# Patient Record
Sex: Female | Born: 1975 | Race: Black or African American | Hispanic: No | Marital: Single | Smoking: Former smoker
Health system: Southern US, Community
[De-identification: ages and names within clinical notes are randomized; demographics above are authoritative.]

## PROBLEM LIST (undated history)

## (undated) DIAGNOSIS — K900 Celiac disease: Secondary | ICD-10-CM

## (undated) DIAGNOSIS — R0600 Dyspnea, unspecified: Secondary | ICD-10-CM

## (undated) DIAGNOSIS — Z860100 Personal history of colon polyps, unspecified: Secondary | ICD-10-CM

## (undated) DIAGNOSIS — R1115 Cyclical vomiting syndrome unrelated to migraine: Secondary | ICD-10-CM

## (undated) DIAGNOSIS — G43909 Migraine, unspecified, not intractable, without status migrainosus: Secondary | ICD-10-CM

## (undated) DIAGNOSIS — D649 Anemia, unspecified: Secondary | ICD-10-CM

## (undated) DIAGNOSIS — F411 Generalized anxiety disorder: Secondary | ICD-10-CM

## (undated) DIAGNOSIS — K219 Gastro-esophageal reflux disease without esophagitis: Secondary | ICD-10-CM

## (undated) HISTORY — DX: Gastro-esophageal reflux disease without esophagitis: K21.9

## (undated) HISTORY — DX: Migraine, unspecified, not intractable, without status migrainosus: G43.909

## (undated) HISTORY — DX: Anemia, unspecified: D64.9

## (undated) HISTORY — DX: Celiac disease: K90.0

## (undated) HISTORY — DX: Generalized anxiety disorder: F41.1

## (undated) HISTORY — DX: Personal history of colonic polyps: Z86.010

## (undated) HISTORY — DX: Personal history of colon polyps, unspecified: Z86.0100

---

## 1997-11-05 HISTORY — PX: APPENDECTOMY: SHX54

## 2004-11-05 HISTORY — PX: ABDOMINAL HYSTERECTOMY: SHX81

## 2016-11-05 HISTORY — PX: COLONOSCOPY: SHX174

## 2016-11-05 HISTORY — PX: ESOPHAGOGASTRODUODENOSCOPY: SHX1529

## 2017-06-19 DIAGNOSIS — R001 Bradycardia, unspecified: Secondary | ICD-10-CM | POA: Diagnosis not present

## 2017-06-19 DIAGNOSIS — F1290 Cannabis use, unspecified, uncomplicated: Secondary | ICD-10-CM | POA: Insufficient documentation

## 2017-06-19 DIAGNOSIS — E872 Acidosis: Secondary | ICD-10-CM | POA: Diagnosis not present

## 2017-06-19 DIAGNOSIS — R112 Nausea with vomiting, unspecified: Secondary | ICD-10-CM | POA: Diagnosis not present

## 2017-06-19 DIAGNOSIS — R1013 Epigastric pain: Secondary | ICD-10-CM | POA: Diagnosis not present

## 2018-06-23 ENCOUNTER — Encounter (HOSPITAL_COMMUNITY): Payer: Self-pay | Admitting: Emergency Medicine

## 2018-06-23 ENCOUNTER — Emergency Department (HOSPITAL_COMMUNITY)
Admission: EM | Admit: 2018-06-23 | Discharge: 2018-06-23 | Disposition: A | Discharge status: Home / Self Care | Payer: Medicaid Other | Attending: Emergency Medicine | Admitting: Emergency Medicine

## 2018-06-23 DIAGNOSIS — R1012 Left upper quadrant pain: Secondary | ICD-10-CM | POA: Diagnosis not present

## 2018-06-23 DIAGNOSIS — F17210 Nicotine dependence, cigarettes, uncomplicated: Secondary | ICD-10-CM | POA: Diagnosis not present

## 2018-06-23 DIAGNOSIS — R1013 Epigastric pain: Secondary | ICD-10-CM | POA: Diagnosis not present

## 2018-06-23 DIAGNOSIS — K2900 Acute gastritis without bleeding: Secondary | ICD-10-CM | POA: Insufficient documentation

## 2018-06-23 LAB — COMPREHENSIVE METABOLIC PANEL
ALBUMIN: 4.9 g/dL (ref 3.5–5.0)
ALT: 13 U/L (ref 0–44)
AST: 18 U/L (ref 15–41)
Alkaline Phosphatase: 74 U/L (ref 38–126)
Anion gap: 17 — ABNORMAL HIGH (ref 5–15)
BUN: 15 mg/dL (ref 6–20)
CHLORIDE: 103 mmol/L (ref 98–111)
CO2: 20 mmol/L — AB (ref 22–32)
CREATININE: 0.63 mg/dL (ref 0.44–1.00)
Calcium: 9.7 mg/dL (ref 8.9–10.3)
GFR calc non Af Amer: >60 mL/min (ref >60)
Glucose, Bld: 86 mg/dL (ref 70–99)
Potassium: 3.7 mmol/L (ref 3.5–5.1)
SODIUM: 140 mmol/L (ref 135–145)
Total Bilirubin: 1.2 mg/dL (ref 0.3–1.2)
Total Protein: 8.7 g/dL — ABNORMAL HIGH (ref 6.5–8.1)

## 2018-06-23 LAB — LIPASE, BLOOD: LIPASE: 29 U/L (ref 11–51)

## 2018-06-23 LAB — CBC
HCT: 42.4 % (ref 36.0–46.0)
HEMOGLOBIN: 13.9 g/dL (ref 12.0–15.0)
MCH: 27.6 pg (ref 26.0–34.0)
MCHC: 32.8 g/dL (ref 30.0–36.0)
MCV: 84.1 fL (ref 78.0–100.0)
Platelets: 252 10*3/uL (ref 150–400)
RBC: 5.04 MIL/uL (ref 3.87–5.11)
RDW: 14 % (ref 11.5–15.5)
WBC: 9.4 10*3/uL (ref 4.0–10.5)

## 2018-06-23 MED ORDER — SODIUM CHLORIDE 0.9 % IV BOLUS
2000.0000 mL | Freq: Once | INTRAVENOUS | Status: AC
  Administered 2018-06-23: 2000 mL via INTRAVENOUS

## 2018-06-23 MED ORDER — PANTOPRAZOLE SODIUM 40 MG PO TBEC: 40.0000 mg | DELAYED_RELEASE_TABLET | Freq: Every day | ORAL | 0 refills | Status: DC

## 2018-06-23 MED ORDER — DIPHENHYDRAMINE HCL 50 MG/ML IJ SOLN
12.5000 mg | Freq: Once | INTRAMUSCULAR | Status: AC
  Administered 2018-06-23: 11:00:00 via INTRAVENOUS
  Filled 2018-06-23: qty 1

## 2018-06-23 MED ORDER — PANTOPRAZOLE SODIUM 40 MG IV SOLR
40.0000 mg | Freq: Once | INTRAVENOUS | Status: AC
  Administered 2018-06-23: 40 mg via INTRAVENOUS
  Filled 2018-06-23: qty 40

## 2018-06-23 MED ORDER — MORPHINE SULFATE (PF) 4 MG/ML IV SOLN
4.0000 mg | Freq: Once | INTRAVENOUS | Status: AC
  Administered 2018-06-23: 4 mg via INTRAVENOUS
  Filled 2018-06-23: qty 1

## 2018-06-23 MED ORDER — SODIUM CHLORIDE 0.9 % IV SOLN
INTRAVENOUS | Status: DC
  Administered 2018-06-23: 10:00:00 via INTRAVENOUS

## 2018-06-23 MED ORDER — FAMOTIDINE 20 MG PO TABS: 20.0000 mg | ORAL_TABLET | Freq: Two times a day (BID) | ORAL | 0 refills | Status: DC

## 2018-06-23 MED ORDER — ONDANSETRON HCL 4 MG/2ML IJ SOLN
4.0000 mg | Freq: Once | INTRAMUSCULAR | Status: AC
  Administered 2018-06-23: 4 mg via INTRAVENOUS
  Filled 2018-06-23: qty 2

## 2018-06-23 NOTE — ED Triage Notes (Signed)
Pt reports that she has ulcer and Saturday she has birthday party for her and her son and tried drinking a mix drink and has had abd pain with n/v since. Reports she was in hospital back in April this year "had acid in my blood and this feels the same".

## 2018-06-23 NOTE — ED Provider Notes (Signed)
Patient is feeling much better.  Labs show normal WBC and benign electro lites except mildly low bicarb with mild anion gap elevation.  I think this is from the degree of vomiting she is had.  However she is feeling significantly better with fluids and her vital signs are benign.  I do not think further work-up is needed.  We discussed cutting back on alcohol especially binging but otherwise she appears stable for discharge home with return precautions.  I will DC with PPI and H2 blocker.  Results for orders placed or performed during the hospital encounter of 06/23/18  Lipase, blood  Result Value Ref Range   Lipase 29 11 - 51 U/L  Comprehensive metabolic panel  Result Value Ref Range   Sodium 140 135 - 145 mmol/L   Potassium 3.7 3.5 - 5.1 mmol/L   Chloride 103 98 - 111 mmol/L   CO2 20 (L) 22 - 32 mmol/L   Glucose, Bld 86 70 - 99 mg/dL   BUN 15 6 - 20 mg/dL   Creatinine, Ser 0.63 0.44 - 1.00 mg/dL   Calcium 9.7 8.9 - 10.3 mg/dL   Total Protein 8.7 (H) 6.5 - 8.1 g/dL   Albumin 4.9 3.5 - 5.0 g/dL   AST 18 15 - 41 U/L   ALT 13 0 - 44 U/L   Alkaline Phosphatase 74 38 - 126 U/L   Total Bilirubin 1.2 0.3 - 1.2 mg/dL   GFR calc non Af Amer >60 >60 mL/min   GFR calc Af Amer >60 >60 mL/min   Anion gap 17 (H) 5 - 15  CBC  Result Value Ref Range   WBC 9.4 4.0 - 10.5 K/uL   RBC 5.04 3.87 - 5.11 MIL/uL   Hemoglobin 13.9 12.0 - 15.0 g/dL   HCT 42.4 36.0 - 46.0 %   MCV 84.1 78.0 - 100.0 fL   MCH 27.6 26.0 - 34.0 pg   MCHC 32.8 30.0 - 36.0 g/dL   RDW 14.0 11.5 - 15.5 %   Platelets 252 150 - 400 K/uL   No results found.    Sherwood Gambler, MD 06/23/18 [phone]

## 2018-06-23 NOTE — Discharge Instructions (Addendum)
If your abdominal pain worsens or you develop recurrent vomiting, or if you develop fever, shortness of breath, chest pain, vomiting blood, blood in your stool or black stool, or any other new/concerning symptoms and return to the ER for evaluation.

## 2018-06-23 NOTE — ED Provider Notes (Signed)
[facility] DEPT Provider Note   CSN: [id number] Arrival date & time: 06/23/18  3704     History   Chief Complaint Chief Complaint  Patient presents with  . Abdominal Pain  . Emesis    HPI Allison Leonard is a 42 y.o. female.  42 year old female presents with several days of epigastric and left upper quadrant abdominal pain.  Has a history of peptic ulcer disease and states that symptoms became worse after she had a lot of stress this weekend as well as drink a quarter bottle of beer.  She also continues to use tobacco products.  States that she has had multiple episodes of emesis which one had blood mixed with saliva.  Denies any hematemesis.  No black stools.  Abdominal pain persistent and nothing makes it better or worse.  Denies any vaginal bleeding or discharge.  No urinary symptoms.     Past Medical History:  Diagnosis Date  . Migraine     There are no active problems to display for this patient.   Past Surgical History:  Procedure Laterality Date  . ABDOMINAL HYSTERECTOMY       OB History   None      Home Medications    Prior to Admission medications   Not on File    Family History No family history on file.  Social History Social History   Tobacco Use  . Smoking status: Current Every Day Smoker    Types: Cigarettes  . Smokeless tobacco: Never Used  Substance Use Topics  . Alcohol use: Yes  . Drug use: Yes    Types: Marijuana     Allergies   Morphine and related   Review of Systems Review of Systems  All other systems reviewed and are negative.    Physical Exam Updated Vital Signs BP (!) 172/105 (BP Location: Right Arm)   Pulse (!) 112   Temp 98.2 F (36.8 C) (Oral)   Resp 18   SpO2 98%   Physical Exam  Constitutional: She is oriented to person, place, and time. She appears well-developed and well-nourished.  Non-toxic appearance. No distress.  HENT:  Head: Normocephalic and  atraumatic.  Eyes: Pupils are equal, round, and reactive to light. Conjunctivae, EOM and lids are normal.  Neck: Normal range of motion. Neck supple. No tracheal deviation present. No thyroid mass present.  Cardiovascular: Normal rate, regular rhythm and normal heart sounds. Exam reveals no gallop.  No murmur heard. Pulmonary/Chest: Effort normal and breath sounds normal. No stridor. No respiratory distress. She has no decreased breath sounds. She has no wheezes. She has no rhonchi. She has no rales.  Abdominal: Soft. Normal appearance and bowel sounds are normal. She exhibits no distension. There is tenderness in the epigastric area and left lower quadrant. There is guarding. There is no rigidity, no rebound and no CVA tenderness.    Musculoskeletal: Normal range of motion. She exhibits no edema or tenderness.  Neurological: She is alert and oriented to person, place, and time. She has normal strength. No cranial nerve deficit or sensory deficit. GCS eye subscore is 4. GCS verbal subscore is 5. GCS motor subscore is 6.  Skin: Skin is warm and dry. No abrasion and no rash noted.  Psychiatric: She has a normal mood and affect. Her speech is normal and behavior is normal.  Nursing note and vitals reviewed.    ED Treatments / Results  Labs (all labs ordered are listed, but only abnormal results are  displayed) Labs Reviewed  CBC  LIPASE, BLOOD  COMPREHENSIVE METABOLIC PANEL  URINALYSIS, ROUTINE W REFLEX MICROSCOPIC    EKG None  Radiology No results found.  Procedures Procedures (including critical care time)  Medications Ordered in ED Medications  sodium chloride 0.9 % bolus 2,000 mL (has no administration in time range)  0.9 %  sodium chloride infusion (has no administration in time range)  pantoprazole (PROTONIX) injection 40 mg (has no administration in time range)  ondansetron (ZOFRAN) injection 4 mg (has no administration in time range)  morphine 4 MG/ML injection 4 mg (has  no administration in time range)  diphenhydrAMINE (BENADRYL) injection 12.5 mg (has no administration in time range)     Initial Impression / Assessment and Plan / ED Course  I have reviewed the triage vital signs and the nursing notes.  Pertinent labs & imaging results that were available during my care of the patient were reviewed by me and considered in my medical decision making (see chart for details).     Patient be medicated for suspected peptic ulcer disease with IV fluids as well as Protonix and given analgesics.  Labs are pending at this time and signed out to Dr. Regenia Skeeter  Final Clinical Impressions(s) / ED Diagnoses   Final diagnoses:  None    ED Discharge Orders    None       Lacretia Leigh, MD 06/23/18 1021

## 2018-07-04 ENCOUNTER — Encounter: Payer: Self-pay | Admitting: Family Medicine

## 2018-07-04 ENCOUNTER — Ambulatory Visit: Payer: Medicaid Other | Admitting: Family Medicine

## 2018-07-04 ENCOUNTER — Other Ambulatory Visit: Payer: Self-pay

## 2018-07-04 VITALS — BP 108/62 | HR 94 | Temp 98.6°F | Ht 63.0 in | Wt 98.2 lb

## 2018-07-04 DIAGNOSIS — F411 Generalized anxiety disorder: Secondary | ICD-10-CM

## 2018-07-04 DIAGNOSIS — G43809 Other migraine, not intractable, without status migrainosus: Secondary | ICD-10-CM | POA: Diagnosis not present

## 2018-07-04 DIAGNOSIS — F439 Reaction to severe stress, unspecified: Secondary | ICD-10-CM | POA: Diagnosis not present

## 2018-07-04 DIAGNOSIS — G43909 Migraine, unspecified, not intractable, without status migrainosus: Secondary | ICD-10-CM

## 2018-07-04 DIAGNOSIS — K219 Gastro-esophageal reflux disease without esophagitis: Secondary | ICD-10-CM | POA: Diagnosis not present

## 2018-07-04 HISTORY — DX: Generalized anxiety disorder: F41.1

## 2018-07-04 HISTORY — DX: Gastro-esophageal reflux disease without esophagitis: K21.9

## 2018-07-04 HISTORY — DX: Migraine, unspecified, not intractable, without status migrainosus: G43.909

## 2018-07-04 NOTE — Assessment & Plan Note (Addendum)
Stable. Controlled on famotidine and dietary modifications.  No red flags on history or exam.  Will obtain records from her previous PCP as well as her gastroenterologist

## 2018-07-04 NOTE — Progress Notes (Signed)
Subjective:  Allison Leonard is a 42 y.o. female who presents to the [facility] today to establish care  HPI:  Patient and her family recently moved to [place] from the [place] area in February.  She was previously seen at [facility] where she had her Pap smear last in 2017.  She has no acute concerns today but would like to discuss her chronic migraines and acid reflux.  She has been on famotidine with good control of her stomach pains though she does still have daily nausea and abdominal discomfort.  She was previously on pantoprazole which did not work as well.  She also has made some dietary changes by giving up caffeine with good results.  No vomiting.  No blood in her stool.  She does have some constipation for which she takes Linzess daily without good relief.  Sometimes she will go an entire week without having a bowel movement but does not seem to strain and occasionally has diarrhea.  With all of her various stomach issues she has had multiple colonoscopies.  Her last one was earlier this year where she was told that they removed several polyps that came back as benign.  She was followed by Dr. Redmond Pulling gastroenterologist in [place].  She has chronic headaches that she was told were migraine she has been taking Excedrin as needed and has been taking it nearly daily.  She is quite functional despite her headaches and able to take care of her son who is special needs from Down syndrome.  She has never been on a migraine prophylactic medication.  She thinks that this is related to her stress.  She manages her stress currently by smoking although she would like to quit and has been cutting down on the number of cigarettes lately.  She also occasionally has some palpitations without chest pain or shortness of breath or lightheadedness or dizziness.  She would like to wait until next visit to discuss this issue as her migraines and acid reflux are more significant to her.  She  also would like to discuss had a future visit a lump in her left breast.  She has had no skin changes, discharge, redness or breast pain.   ROS: Per HPI, otherwise all systems reviewed and negative  PMH:  The following were reviewed and entered/updated in epic: Past Medical History:  Diagnosis Date  . Acid reflux 07/04/2018  . Generalized anxiety disorder 07/04/2018  . History of colon polyps    last colonoscopy 2018 removed multiple benign polyps  . Migraines 07/04/2018   Patient Active Problem List   Diagnosis Date Noted  . Generalized anxiety disorder 07/04/2018  . Acid reflux 07/04/2018  . Migraines 07/04/2018   Past Surgical History:  Procedure Laterality Date  . ABDOMINAL HYSTERECTOMY  2006  . APPENDECTOMY  1999    Family History  Problem Relation Age of Onset  . Diabetes Father   . Hypertension Father   . Hyperlipidemia Father   . Heart attack Maternal Grandmother   . Heart attack Paternal Grandmother   . Cervical cancer Maternal Aunt   . Throat cancer Maternal Uncle   . Stroke Maternal Uncle   . Down syndrome Son     Medications- reviewed and updated Current Outpatient Medications  Medication Sig Dispense Refill  . linaclotide (LINZESS) 72 MCG capsule Take 72 mcg by mouth daily before breakfast.    . aspirin-acetaminophen-caffeine (EXCEDRIN MIGRAINE) 250-250-65 MG tablet Take 1 tablet by mouth every 4 (four)  hours as needed for headache.    . famotidine (PEPCID) 20 MG tablet Take 1 tablet (20 mg total) by mouth 2 (two) times daily. 30 tablet 0   No current facility-administered medications for this visit.     Allergies-reviewed and updated Allergies  Allergen Reactions  . Morphine And Related Itching    Social History   Socioeconomic History  . Marital status: Single    Spouse name: Not on file  . Number of children: Not on file  . Years of education: Not on file  . Highest education level: Not on file  Occupational History  . Not on file    Social Needs  . Financial resource strain: Not on file  . Food insecurity:    Worry: Not on file    Inability: Not on file  . Transportation needs:    Medical: Not on file    Non-medical: Not on file  Tobacco Use  . Smoking status: Current Every Day Smoker    Packs/day: 0.50    Years: 7.00    Pack years: 3.50    Types: Cigarettes  . Smokeless tobacco: Never Used  Substance and Sexual Activity  . Alcohol use: Yes  . Drug use: Yes    Types: Marijuana  . Sexual activity: Not on file  Lifestyle  . Physical activity:    Days per week: Not on file    Minutes per session: Not on file  . Stress: Not on file  Relationships  . Social connections:    Talks on phone: Not on file    Gets together: Not on file    Attends religious service: Not on file    Active member of club or organization: Not on file    Attends meetings of clubs or organizations: Not on file    Relationship status: Not on file  Other Topics Concern  . Not on file  Social History Narrative  . Not on file     Objective:  Physical Exam: BP 108/62   Pulse 94   Temp 98.6 F (37 C) (Oral)   Ht 5' 3"  (1.6 m)   Wt 98 lb 3.2 oz (44.5 kg)   SpO2 98%   BMI 17.40 kg/m   Gen: NAD, resting comfortably HEENT: [place], AT. TMs pearly with good light reflex bilaterally. Ear canals healthy.  Oropharynx nonerythematous Neck: supple, normal ROM CV: RRR with no murmurs appreciated Pulm: NWOB, CTAB with no crackles, wheezes, or rhonchi GI: Normal bowel sounds present. Soft, Nontender, Nondistended. MSK: no edema, cyanosis, or clubbing noted Skin: warm, dry Neuro: grossly normal, moves all extremities Psych: Normal affect and thought content  Assessment/Plan:  Stress Per patient reported history she has been diagnosed with anxiety in the past not currently on any medication.  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  She has previously managing this by smoking but with her current efforts for smoking cessation she is having it more difficult time just causing her migraines to be worse.  Discussed that counseling may be the best option for her as it does not sound significant enough to impede her daily life and therefore she would not be a good candidate for any medication at this time.  Migraines Patient has a self-reported history of migraines however her most recent symptoms sound more like tension headaches from stress.  She is quite  functional when she has these nearly daily headaches.  Discussed continued as needed Excedrin and recommended counseling for stress.  Acid reflux Controlled on famotidine and dietary modifications.  No red flags on history or exam.  Will obtain records from her previous PCP as well as her gastroenterologist  Patient to return at her convenience to talk about her palpitations and her breast lump.  Bufford Lope, DO PGY-3, [facility] 07/04/2018 9:33 AM

## 2018-07-04 NOTE — Assessment & Plan Note (Addendum)
Stable. Patient has a self-reported history of migraines however her most recent symptoms sound more like tension headaches from stress.  She is quite functional when she has these nearly daily headaches.  Discussed continued as needed Excedrin and recommended counseling for stress.

## 2018-07-04 NOTE — Assessment & Plan Note (Signed)
Per patient reported history she has been diagnosed with anxiety in the past not currently on any medication.  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  She has previously managing this by smoking but with her current efforts for smoking cessation she is having it more difficult time just causing her migraines to be worse.  Discussed that counseling may be the best option for her as it does not sound significant enough to impede her daily life and therefore she would not be a good candidate for any medication at this time.

## 2018-07-04 NOTE — Patient Instructions (Addendum)
Let us know if you would like counseling for your stress and smoking cessation.   Please make an appointment at your convenience to discuss your other concerns.  We will get records from your last doctor.

## 2018-07-25 ENCOUNTER — Encounter: Payer: Self-pay | Admitting: Family Medicine

## 2018-10-16 ENCOUNTER — Other Ambulatory Visit: Payer: Self-pay

## 2018-10-16 ENCOUNTER — Encounter (HOSPITAL_COMMUNITY): Payer: Self-pay | Admitting: Emergency Medicine

## 2018-10-16 ENCOUNTER — Emergency Department (HOSPITAL_COMMUNITY)
Admission: EM | Admit: 2018-10-16 | Discharge: 2018-10-16 | Disposition: A | Discharge status: Home / Self Care | Payer: Medicaid Other | Attending: Emergency Medicine | Admitting: Emergency Medicine

## 2018-10-16 DIAGNOSIS — R112 Nausea with vomiting, unspecified: Secondary | ICD-10-CM | POA: Diagnosis not present

## 2018-10-16 DIAGNOSIS — R1115 Cyclical vomiting syndrome unrelated to migraine: Secondary | ICD-10-CM | POA: Insufficient documentation

## 2018-10-16 DIAGNOSIS — E860 Dehydration: Secondary | ICD-10-CM | POA: Diagnosis not present

## 2018-10-16 DIAGNOSIS — K2970 Gastritis, unspecified, without bleeding: Secondary | ICD-10-CM | POA: Diagnosis not present

## 2018-10-16 DIAGNOSIS — F17210 Nicotine dependence, cigarettes, uncomplicated: Secondary | ICD-10-CM | POA: Insufficient documentation

## 2018-10-16 DIAGNOSIS — G43A0 Cyclical vomiting, not intractable: Secondary | ICD-10-CM | POA: Diagnosis not present

## 2018-10-16 DIAGNOSIS — R1084 Generalized abdominal pain: Secondary | ICD-10-CM | POA: Diagnosis not present

## 2018-10-16 DIAGNOSIS — Z79899 Other long term (current) drug therapy: Secondary | ICD-10-CM | POA: Diagnosis not present

## 2018-10-16 DIAGNOSIS — R1013 Epigastric pain: Secondary | ICD-10-CM | POA: Diagnosis not present

## 2018-10-16 DIAGNOSIS — I499 Cardiac arrhythmia, unspecified: Secondary | ICD-10-CM | POA: Diagnosis not present

## 2018-10-16 LAB — CBC WITH DIFFERENTIAL/PLATELET
Abs Immature Granulocytes: 0.05 10*3/uL (ref 0.00–0.07)
BASOS PCT: 0 %
Basophils Absolute: 0 10*3/uL (ref 0.0–0.1)
EOS PCT: 0 %
Eosinophils Absolute: 0.1 10*3/uL (ref 0.0–0.5)
HEMATOCRIT: 42.6 % (ref 36.0–46.0)
HEMOGLOBIN: 12.9 g/dL (ref 12.0–15.0)
Immature Granulocytes: 0 %
LYMPHS ABS: 1.1 10*3/uL (ref 0.7–4.0)
Lymphocytes Relative: 10 %
MCH: 26.6 pg (ref 26.0–34.0)
MCHC: 30.3 g/dL (ref 30.0–36.0)
MCV: 87.8 fL (ref 80.0–100.0)
Monocytes Absolute: 0.3 10*3/uL (ref 0.1–1.0)
Monocytes Relative: 2 %
NRBC: 0 % (ref 0.0–0.2)
Neutro Abs: 9.8 10*3/uL — ABNORMAL HIGH (ref 1.7–7.7)
Neutrophils Relative %: 88 %
Platelets: 202 10*3/uL (ref 150–400)
RBC: 4.85 MIL/uL (ref 3.87–5.11)
RDW: 13.9 % (ref 11.5–15.5)
WBC: 11.3 10*3/uL — AB (ref 4.0–10.5)

## 2018-10-16 LAB — COMPREHENSIVE METABOLIC PANEL
ALBUMIN: 4.8 g/dL (ref 3.5–5.0)
ALK PHOS: 55 U/L (ref 38–126)
ALT: 15 U/L (ref 0–44)
ANION GAP: 18 — AB (ref 5–15)
AST: 23 U/L (ref 15–41)
BUN: 21 mg/dL — ABNORMAL HIGH (ref 6–20)
CO2: 19 mmol/L — ABNORMAL LOW (ref 22–32)
Calcium: 9.2 mg/dL (ref 8.9–10.3)
Chloride: 103 mmol/L (ref 98–111)
Creatinine, Ser: 0.6 mg/dL (ref 0.44–1.00)
GFR calc Af Amer: >60 mL/min (ref >60)
GFR calc non Af Amer: >60 mL/min (ref >60)
GLUCOSE: 141 mg/dL — AB (ref 70–99)
Potassium: 3.7 mmol/L (ref 3.5–5.1)
SODIUM: 140 mmol/L (ref 135–145)
TOTAL PROTEIN: 7.9 g/dL (ref 6.5–8.1)
Total Bilirubin: 0.8 mg/dL (ref 0.3–1.2)

## 2018-10-16 LAB — URINALYSIS, ROUTINE W REFLEX MICROSCOPIC
Bilirubin Urine: NEGATIVE
GLUCOSE, UA: NEGATIVE mg/dL
Ketones, ur: 80 mg/dL — AB
NITRITE: NEGATIVE
PROTEIN: 30 mg/dL — AB
Specific Gravity, Urine: 1.021 (ref 1.005–1.030)
pH: 6 (ref 5.0–8.0)

## 2018-10-16 LAB — LIPASE, BLOOD: Lipase: 22 U/L (ref 11–51)

## 2018-10-16 MED ORDER — ONDANSETRON HCL 4 MG/2ML IJ SOLN
4.0000 mg | Freq: Once | INTRAMUSCULAR | Status: AC
  Administered 2018-10-16: 4 mg via INTRAVENOUS
  Filled 2018-10-16: qty 2

## 2018-10-16 MED ORDER — ONDANSETRON HCL 4 MG PO TABS: 4.0000 mg | ORAL_TABLET | Freq: Four times a day (QID) | ORAL | 0 refills | Status: DC

## 2018-10-16 MED ORDER — HALOPERIDOL LACTATE 5 MG/ML IJ SOLN
5.0000 mg | Freq: Once | INTRAMUSCULAR | Status: AC
  Administered 2018-10-16: 5 mg via INTRAVENOUS
  Filled 2018-10-16: qty 1

## 2018-10-16 MED ORDER — ALUM & MAG HYDROXIDE-SIMETH 200-200-20 MG/5ML PO SUSP
30.0000 mL | Freq: Once | ORAL | Status: AC
  Administered 2018-10-16: 30 mL via ORAL
  Filled 2018-10-16: qty 30

## 2018-10-16 MED ORDER — SODIUM CHLORIDE 0.9 % IV BOLUS
1000.0000 mL | Freq: Once | INTRAVENOUS | Status: AC
  Administered 2018-10-16: 1000 mL via INTRAVENOUS

## 2018-10-16 NOTE — ED Notes (Signed)
Bed: IQ79 Expected date:  Expected time:  Means of arrival:  Comments: 42 yr old abdominal pain

## 2018-10-16 NOTE — ED Triage Notes (Signed)
Pt from home having sudden onset N/V 2 - 3 episodes denies diarrhea

## 2018-10-16 NOTE — ED Provider Notes (Signed)
[facility] DEPT Provider Note   CSN: [id number] Arrival date & time: 10/16/18  2876     History   Chief Complaint Chief Complaint  Patient presents with  . Abdominal Pain    HPI Allison Leonard is a 42 y.o. female.  The history is provided by the patient. No language interpreter was used.  Abdominal Pain       42 year old female with history of GERD, anxiety, and prior history of abdominal hysterectomy and appendectomy presenting for evaluation of abdominal pain.  Patient states she has had epigastric abdominal pain which has been recurrent for the past several years.  It has flareup for the past 2 to 3 days.  She described pain as a burning sharp throbbing sensation to her epigastrium, persistent, with associated nausea and vomiting.  She vomits yellow emesis without blood or bilious content.  She has normal bowel movement.  She endorsed chills.  She described pain as 6 out of 10.  No associated fever, lightheadedness, dizziness, chest pain, trouble breathing, productive cough, dysuria, hematuria, hematochezia or melena.  She denies any specific treatment tried.  She denies alcohol abuse.  She does admits to using marijuana on a regular basis.  Past Medical History:  Diagnosis Date  . Acid reflux 07/04/2018  . Generalized anxiety disorder 07/04/2018  . History of colon polyps    last colonoscopy 2018 removed multiple benign polyps  . Migraines 07/04/2018    Patient Active Problem List   Diagnosis Date Noted  . Stress 07/04/2018  . Acid reflux 07/04/2018  . Migraines 07/04/2018    Past Surgical History:  Procedure Laterality Date  . ABDOMINAL HYSTERECTOMY  2006  . APPENDECTOMY  1999     OB History   No obstetric history on file.      Home Medications    Prior to Admission medications   Medication Sig Start Date End Date Taking? Authorizing Provider  aspirin-acetaminophen-caffeine (EXCEDRIN MIGRAINE) 707 [id number] 2040 MG tablet  Take 1 tablet by mouth every 4 (four) hours as needed for headache.    [provider]  famotidine (PEPCID) 20 MG tablet Take 1 tablet (20 mg total) by mouth 2 (two) times daily. 06/23/18   Sherwood Gambler, MD  linaclotide Rolan Lipa) 72 MCG capsule Take 72 mcg by mouth daily before breakfast.    [provider]    Family History Family History  Problem Relation Age of Onset  . Diabetes Father   . Hypertension Father   . Hyperlipidemia Father   . Heart attack Maternal Grandmother   . Heart attack Paternal Grandmother   . Cervical cancer Maternal Aunt   . Throat cancer Maternal Uncle   . Stroke Maternal Uncle   . Down syndrome Son     Social History Social History   Tobacco Use  . Smoking status: Current Every Day Smoker    Packs/day: 0.50    Years: 7.00    Pack years: 3.50    Types: Cigarettes  . Smokeless tobacco: Never Used  Substance Use Topics  . Alcohol use: Yes  . Drug use: Yes    Types: Marijuana     Allergies   Morphine and related   Review of Systems Review of Systems  Gastrointestinal: Positive for abdominal pain.  All other systems reviewed and are negative.    Physical Exam Updated Vital Signs There were no vitals taken for this visit.  Physical Exam Vitals signs and nursing note reviewed.  Constitutional:      General:  She is not in acute distress.    Appearance: She is well-developed.     Comments: Patient laying in bed, with a veil covering her face, appears to be mildly uncomfortable.  HENT:     Head: Atraumatic.     Mouth/Throat:     Mouth: Mucous membranes are moist.  Eyes:     Conjunctiva/sclera: Conjunctivae normal.  Neck:     Musculoskeletal: Neck supple.  Cardiovascular:     Rate and Rhythm: Normal rate and regular rhythm.     Heart sounds: Normal heart sounds.  Pulmonary:     Effort: Pulmonary effort is normal.     Breath sounds: Normal breath sounds.  Abdominal:     General: Abdomen is flat. Bowel sounds  are normal.     Tenderness: There is abdominal tenderness in the epigastric area. Negative signs include Murphy's sign and McBurney's sign.     Hernia: No hernia is present.  Skin:    Findings: No rash.  Neurological:     Mental Status: She is alert.      ED Treatments / Results  Labs (all labs ordered are listed, but only abnormal results are displayed) Labs Reviewed  CBC WITH DIFFERENTIAL/PLATELET - Abnormal; Notable for the following components:      Result Value   WBC 11.3 (*)    Neutro Abs 9.8 (*)    All other components within normal limits  COMPREHENSIVE METABOLIC PANEL - Abnormal; Notable for the following components:   CO2 19 (*)    Glucose, Bld 141 (*)    BUN 21 (*)    Anion gap 18 (*)    All other components within normal limits  URINALYSIS, ROUTINE W REFLEX MICROSCOPIC - Abnormal; Notable for the following components:   APPearance HAZY (*)    Hgb urine dipstick SMALL (*)    Ketones, ur 80 (*)    Protein, ur 30 (*)    Leukocytes, UA TRACE (*)    Bacteria, UA FEW (*)    All other components within normal limits  LIPASE, BLOOD    EKG None  Radiology No results found.  Procedures Procedures (including critical care time)  Medications Ordered in ED Medications  ondansetron (ZOFRAN) injection 4 mg (4 mg Intravenous Given 10/16/18 0649)  haloperidol lactate (HALDOL) injection 5 mg (5 mg Intravenous Given 10/16/18 0649)  sodium chloride 0.9 % bolus 1,000 mL (0 mLs Intravenous Stopped 10/16/18 0834)  alum & mag hydroxide-simeth (MAALOX/MYLANTA) 200-200-20 MG/5ML suspension 30 mL (30 mLs Oral Given 10/16/18 4782)     Initial Impression / Assessment and Plan / ED Course  I have reviewed the triage vital signs and the nursing notes.  Pertinent labs & imaging results that were available during my care of the patient were reviewed by me and considered in my medical decision making (see chart for details).     BP 130/70   Pulse (!) 105   Resp 14   SpO2  98%    Final Clinical Impressions(s) / ED Diagnoses   Final diagnoses:  Cyclical vomiting  Dehydration    ED Discharge Orders         Ordered    ondansetron (ZOFRAN) 4 MG tablet  Every 6 hours     10/16/18 0936         6:24 AM Patient with epigastric abdominal discomfort with nausea and vomiting.  This appears to be a recurrent problem that she had had in the past.  She does have tenderness to her  epigastric region.  She does admits to using marijuana on a regular basis which may contribute to her symptoms.  Work-up initiated, symptomatic treatment provided along with IV fluid.  Will monitor closely.  GI cocktail ordered.  9:35 AM Patient felt much better after receiving her treatment.  She is able to tolerate p.o.  Her labs are reassuring.  She does have evidence of dehydration which was addressed with IV fluid.  I encourage patient to avoid marijuana use as it can cause or worsen her symptoms.  Patient agrees.  Patient discharged home with antinausea medication and return precaution.   Domenic Moras, PA-C 10/16/18 3567    Shanon Rosser, MD 10/16/18 [phone]

## 2018-10-17 ENCOUNTER — Emergency Department (HOSPITAL_COMMUNITY)
Admission: EM | Admit: 2018-10-17 | Discharge: 2018-10-17 | Disposition: A | Discharge status: Home / Self Care | Payer: Medicaid Other | Attending: Emergency Medicine | Admitting: Emergency Medicine

## 2018-10-17 ENCOUNTER — Encounter (HOSPITAL_COMMUNITY): Payer: Self-pay

## 2018-10-17 ENCOUNTER — Other Ambulatory Visit: Payer: Self-pay

## 2018-10-17 DIAGNOSIS — Z79899 Other long term (current) drug therapy: Secondary | ICD-10-CM | POA: Insufficient documentation

## 2018-10-17 DIAGNOSIS — R0602 Shortness of breath: Secondary | ICD-10-CM | POA: Diagnosis not present

## 2018-10-17 DIAGNOSIS — Z7982 Long term (current) use of aspirin: Secondary | ICD-10-CM | POA: Insufficient documentation

## 2018-10-17 DIAGNOSIS — T782XXA Anaphylactic shock, unspecified, initial encounter: Secondary | ICD-10-CM | POA: Diagnosis not present

## 2018-10-17 DIAGNOSIS — F17210 Nicotine dependence, cigarettes, uncomplicated: Secondary | ICD-10-CM | POA: Diagnosis not present

## 2018-10-17 DIAGNOSIS — J029 Acute pharyngitis, unspecified: Secondary | ICD-10-CM | POA: Diagnosis not present

## 2018-10-17 DIAGNOSIS — R000 Tachycardia, unspecified: Secondary | ICD-10-CM | POA: Diagnosis not present

## 2018-10-17 DIAGNOSIS — R600 Localized edema: Secondary | ICD-10-CM | POA: Diagnosis present

## 2018-10-17 LAB — CBC WITH DIFFERENTIAL/PLATELET
Abs Immature Granulocytes: 0.03 10*3/uL (ref 0.00–0.07)
BASOS ABS: 0 10*3/uL (ref 0.0–0.1)
BASOS PCT: 0 %
EOS PCT: 1 %
Eosinophils Absolute: 0.1 10*3/uL (ref 0.0–0.5)
HCT: 43.7 % (ref 36.0–46.0)
Hemoglobin: 13.5 g/dL (ref 12.0–15.0)
IMMATURE GRANULOCYTES: 0 %
Lymphocytes Relative: 29 %
Lymphs Abs: 3 10*3/uL (ref 0.7–4.0)
MCH: 26.8 pg (ref 26.0–34.0)
MCHC: 30.9 g/dL (ref 30.0–36.0)
MCV: 86.9 fL (ref 80.0–100.0)
Monocytes Absolute: 0.7 10*3/uL (ref 0.1–1.0)
Monocytes Relative: 7 %
NEUTROS PCT: 63 %
NRBC: 0 % (ref 0.0–0.2)
Neutro Abs: 6.4 10*3/uL (ref 1.7–7.7)
PLATELETS: 226 10*3/uL (ref 150–400)
RBC: 5.03 MIL/uL (ref 3.87–5.11)
RDW: 14 % (ref 11.5–15.5)
WBC: 10.3 10*3/uL (ref 4.0–10.5)

## 2018-10-17 LAB — BASIC METABOLIC PANEL
Anion gap: 11 (ref 5–15)
BUN: 12 mg/dL (ref 6–20)
CALCIUM: 9.6 mg/dL (ref 8.9–10.3)
CO2: 27 mmol/L (ref 22–32)
Chloride: 101 mmol/L (ref 98–111)
Creatinine, Ser: 0.59 mg/dL (ref 0.44–1.00)
GFR calc Af Amer: >60 mL/min (ref >60)
GFR calc non Af Amer: >60 mL/min (ref >60)
Glucose, Bld: 135 mg/dL — ABNORMAL HIGH (ref 70–99)
Potassium: 3.8 mmol/L (ref 3.5–5.1)
Sodium: 139 mmol/L (ref 135–145)

## 2018-10-17 LAB — I-STAT BETA HCG BLOOD, ED (MC, WL, AP ONLY): I-stat hCG, quantitative: <5 m[IU]/mL (ref <5)

## 2018-10-17 MED ORDER — FAMOTIDINE 20 MG PO TABS: 20.0000 mg | ORAL_TABLET | Freq: Two times a day (BID) | ORAL | 0 refills | Status: DC

## 2018-10-17 MED ORDER — EPINEPHRINE 0.3 MG/0.3ML IJ SOAJ
INTRAMUSCULAR | Status: AC
  Filled 2018-10-17: qty 0.3

## 2018-10-17 MED ORDER — EPINEPHRINE 0.3 MG/0.3ML IJ SOAJ: 0.3000 mg | Freq: Once | INTRAMUSCULAR | 0 refills | Status: AC

## 2018-10-17 MED ORDER — FAMOTIDINE IN NACL 20-0.9 MG/50ML-% IV SOLN
20.0000 mg | Freq: Once | INTRAVENOUS | Status: AC
  Administered 2018-10-17: 20 mg via INTRAVENOUS
  Filled 2018-10-17: qty 50

## 2018-10-17 MED ORDER — EPINEPHRINE 0.3 MG/0.3ML IJ SOAJ
0.3000 mg | Freq: Once | INTRAMUSCULAR | Status: AC
  Administered 2018-10-17: 0.3 mg via INTRAMUSCULAR

## 2018-10-17 MED ORDER — DIPHENHYDRAMINE HCL 50 MG/ML IJ SOLN
25.0000 mg | Freq: Once | INTRAMUSCULAR | Status: AC
  Administered 2018-10-17: 25 mg via INTRAVENOUS
  Filled 2018-10-17: qty 1

## 2018-10-17 MED ORDER — METHYLPREDNISOLONE SODIUM SUCC 125 MG IJ SOLR
125.0000 mg | Freq: Once | INTRAMUSCULAR | Status: AC
  Administered 2018-10-17: 125 mg via INTRAVENOUS
  Filled 2018-10-17: qty 2

## 2018-10-17 MED ORDER — SODIUM CHLORIDE 0.9 % IV BOLUS
1000.0000 mL | Freq: Once | INTRAVENOUS | Status: AC
  Administered 2018-10-17: 1000 mL via INTRAVENOUS

## 2018-10-17 MED ORDER — PREDNISONE 20 MG PO TABS: 60.0000 mg | ORAL_TABLET | Freq: Every day | ORAL | 0 refills | Status: AC

## 2018-10-17 NOTE — ED Provider Notes (Signed)
[facility] DEPT Provider Note   CSN: [id number] Arrival date & time: 10/17/18  1141     History   Chief Complaint Chief Complaint  Patient presents with  . Allergic Reaction    HPI Allison Leonard is a 42 y.o. female.  Allison Leonard is a 42 y.o. female with a history of generalized anxiety, migraines and acid reflux, who presents to the emergency department with concern for allergic reaction.  He was seen here in the emergency department for evaluation of abdominal pain, nausea and vomiting yesterday was given fentanyl and was also given Zofran and this morning she had acute onset of swelling of the lips, throat closing sensation and difficulty breathing.  She also reports feeling lightheaded, and like she may pass out.  No associated chest pain.  No nausea, vomiting or abdominal pain.  She is having difficulty swallowing her saliva.  She denies any other new medications aside from a dose of fentanyl she received yesterday afternoon, no new foods or household products.  Denies history of previous anaphylaxis.  No medications prior to arrival to treat symptoms.     Past Medical History:  Diagnosis Date  . Acid reflux 07/04/2018  . Generalized anxiety disorder 07/04/2018  . History of colon polyps    last colonoscopy 2018 removed multiple benign polyps  . Migraines 07/04/2018    Patient Active Problem List   Diagnosis Date Noted  . Stress 07/04/2018  . Acid reflux 07/04/2018  . Migraines 07/04/2018    Past Surgical History:  Procedure Laterality Date  . ABDOMINAL HYSTERECTOMY  2006  . APPENDECTOMY  1999     OB History   No obstetric history on file.      Home Medications    Prior to Admission medications   Medication Sig Start Date End Date Taking? Authorizing Provider  aspirin-acetaminophen-caffeine (EXCEDRIN MIGRAINE) [phone] MG tablet Take 1 tablet by mouth every 4 (four) hours as needed for headache.     [provider]  famotidine (PEPCID) 20 MG tablet Take 1 tablet (20 mg total) by mouth 2 (two) times daily. 06/23/18   Sherwood Gambler, MD  linaclotide [facility]) 72 MCG capsule Take 72 mcg by mouth daily before breakfast.    [provider]  ondansetron (ZOFRAN) 4 MG tablet Take 1 tablet (4 mg total) by mouth every 6 (six) hours. 10/16/18   Domenic Moras, PA-C    Family History Family History  Problem Relation Age of Onset  . Diabetes Father   . Hypertension Father   . Hyperlipidemia Father   . Heart attack Maternal Grandmother   . Heart attack Paternal Grandmother   . Cervical cancer Maternal Aunt   . Throat cancer Maternal Uncle   . Stroke Maternal Uncle   . Down syndrome Son     Social History Social History   Tobacco Use  . Smoking status: Current Every Day Smoker    Packs/day: 0.50    Years: 7.00    Pack years: 3.50    Types: Cigarettes  . Smokeless tobacco: Never Used  Substance Use Topics  . Alcohol use: Yes  . Drug use: Yes    Types: Marijuana     Allergies   Morphine and related   Review of Systems Review of Systems  Constitutional: Negative for chills and fever.  HENT: Positive for drooling, facial swelling, sore throat and trouble swallowing. Negative for congestion.   Eyes: Negative for visual disturbance.  Respiratory: Positive for shortness of breath,  wheezing and stridor. Negative for cough.   Cardiovascular: Negative for chest pain.  Gastrointestinal: Negative for abdominal pain, nausea and vomiting.  Musculoskeletal: Negative for arthralgias and myalgias.  Skin: Negative for color change and rash.  Neurological: Positive for light-headedness. Negative for dizziness, syncope and headaches.     Physical Exam Updated Vital Signs BP (!) 168/104 (BP Location: Left Arm)   Pulse (!) 127   Temp 98.7 F (37.1 C) (Oral)   Ht 5' 6"  (1.676 m)   Wt 44.5 kg   SpO2 97%   BMI 15.82 kg/m   Physical Exam Vitals signs and nursing note  reviewed.  Constitutional:      General: She is in acute distress.     Appearance: Normal appearance.     Comments: On arrival patient with noisy breathing, spitting saliva into a paper towel due to difficulty swallowing and appears acutely distressed.  HENT:     Head: Normocephalic and atraumatic.     Nose: Nose normal.     Mouth/Throat:     Comments: Swelling of the lips, no tongue swelling noted but posterior oropharynx is slightly edematous, patient spitting secretions into paper towel due to difficulty swallowing, patient able to speak but voice is somewhat muffled Neck:     Musculoskeletal: Neck supple.     Comments: Some stridor noted on auscultation with noisy breathing on inspiration Cardiovascular:     Rate and Rhythm: Regular rhythm. Tachycardia present.     Pulses: Normal pulses.     Heart sounds: Normal heart sounds. No murmur. No friction rub. No gallop.      Comments: Mild tachycardia, regular rhythm Pulmonary:     Breath sounds: Wheezing present.     Comments: Increased respiratory effort but this seems to be due primarily to upper airway issue, few scattered wheezes and transmitted upper airway sounds throughout but good air movement Abdominal:     General: Abdomen is flat. Bowel sounds are normal. There is no distension.     Palpations: Abdomen is soft. There is no mass.     Tenderness: There is no abdominal tenderness. There is no guarding.     Comments: Abdomen soft, nondistended, nontender to palpation in all quadrants without guarding or peritoneal signs  Musculoskeletal:        General: No swelling or deformity.  Skin:    General: Skin is warm and dry.     Capillary Refill: Capillary refill takes less than 2 seconds.     Findings: No rash.  Neurological:     Mental Status: She is alert and oriented to person, place, and time. Mental status is at baseline.  Psychiatric:        Mood and Affect: Mood normal.        Behavior: Behavior normal.      ED  Treatments / Results  Labs (all labs ordered are listed, but only abnormal results are displayed) Labs Reviewed  BASIC METABOLIC PANEL - Abnormal; Notable for the following components:      Result Value   Glucose, Bld 135 (*)    All other components within normal limits  CBC WITH DIFFERENTIAL/PLATELET  I-STAT BETA HCG BLOOD, ED ([facility], [facility], [facility] ONLY)    EKG EKG Interpretation  Date/Time:  Friday October 17 2018 12:18:10 EST Ventricular Rate:  92 PR Interval:    QRS Duration: 78 QT Interval:  343 QTC Calculation: 425 R Axis:   87 Text Interpretation:  Sinus rhythm Short PR interval Probable left atrial enlargement  Left ventricular hypertrophy Nonspecific T abnormalities, lateral leads ST elev, probable normal early repol pattern Artifact in lead(s) I III aVL V5 No previous ECGs available Confirmed by Wandra Arthurs [phone]) on 10/17/2018 2:38:43 PM   Radiology No results found.  Procedures .Critical Care Performed by: Jacqlyn Larsen, PA-C Authorized by: Jacqlyn Larsen, PA-C   Critical care provider statement:    Critical care time (minutes):  45   Critical care time was exclusive of:  Separately billable procedures and treating other patients   Critical care was necessary to treat or prevent imminent or life-threatening deterioration of the following conditions:  Respiratory failure (Anaphylaxis)   Critical care was time spent personally by me on the following activities:  Discussions with consultants, evaluation of patient's response to treatment, examination of patient, ordering and performing treatments and interventions, ordering and review of laboratory studies, ordering and review of radiographic studies, pulse oximetry, re-evaluation of patient's condition, obtaining history from patient or surrogate and review of old charts   I assumed direction of critical care for this patient from another provider in my specialty: no     (including critical care time)  Medications Ordered  in ED Medications  EPINEPHrine (EPI-PEN) injection 0.3 mg (0.3 mg Intramuscular Given 10/17/18 1211)  methylPREDNISolone sodium succinate (SOLU-MEDROL) 125 mg/2 mL injection 125 mg (125 mg Intravenous Given 10/17/18 1213)  famotidine (PEPCID) IVPB 20 mg premix (0 mg Intravenous Stopped 10/17/18 1246)  diphenhydrAMINE (BENADRYL) injection 25 mg (25 mg Intravenous Given 10/17/18 1215)  sodium chloride 0.9 % bolus 1,000 mL (0 mLs Intravenous Stopped 10/17/18 1435)     Initial Impression / Assessment and Plan / ED Course  I have reviewed the triage vital signs and the nursing notes.  Pertinent labs & imaging results that were available during my care of the patient were reviewed by me and considered in my medical decision making (see chart for details).  Patient presents with acute onset of lip swelling, throat closing sensation, difficulty breathing and swallowing secretions on arrival patient appears acutely distressed, tachycardic to 127, slightly hypertensive.  Presentation concerning for anaphylaxis, I was called to bedside immediately upon arrival.  Patient was given 0.3 mg of IM epinephrine with near immediate improvement in swelling and difficulty breathing.  Patient also given IV fluids, Solu-Medrol, Pepcid and Benadryl to help prevent recurrence of reaction.  Patient was given Zofran yesterday evening in the emergency department, no other known new medications.  No new foods or household products.  Etiology of anaphylaxis not entirely clear although Zofran most likely agent, this was added to the patient's allergy list.  Will get basic labs in case patient's reaction returns and she requires admission.  Labs overall reassuring, no leukocytosis, normal hemoglobin and no acute electrolyte derangements requiring intervention, negative pregnancy.  EKG with some signs of early re-pole but no acute ischemic changes.  Patient has been reevaluated several times since receiving epi and medications  with no recurrence of her symptoms, she is tolerating secretions and breathing with no signs of stridor or upper airway obstruction.  Lungs are clear.   4:00 PM Pt has been observed for 4 hours with no recurrence of symptoms at this time she stable for discharge home.  Will prescribe 5-day course of steroids and encourage patient to continue use Benadryl and Pepcid.  Patient provided prescription for EpiPen and encouraged to discontinue any use of Zofran.  Final Clinical Impressions(s) / ED Diagnoses   Final diagnoses:  Anaphylaxis, initial encounter  ED Discharge Orders         Ordered    predniSONE (DELTASONE) 20 MG tablet  Daily     10/17/18 1518    famotidine (PEPCID) 20 MG tablet  2 times daily     10/17/18 1518    EPINEPHrine 0.3 mg/0.3 mL IJ SOAJ injection   Once     10/17/18 1518           Jacqlyn Larsen, PA-C 10/17/18 1603    Drenda Freeze, MD 10/18/18 [phone]

## 2018-10-17 NOTE — Discharge Instructions (Signed)
You had an anaphylactic reaction today, this may have been due to Zofran, please see discontinue seeing Zofran.  Take steroids, Pepcid and Benadryl for the next 5 days.  You have been prescribed an EpiPen to keep with you in case you have similar reaction symptoms in the future.  If you do have to use your EpiPen is very important that you present to the emergency department for evaluation.  You can use this if you have any facial swelling, difficulty breathing, or unable to swallow or feel like your throat is closing or feel lightheaded like he may pass out.

## 2018-10-17 NOTE — ED Triage Notes (Signed)
Pt states that she was seen here earlier and was given nausea medication. Since then, pt has been having oral swelling and jaw locking.

## 2018-10-27 ENCOUNTER — Ambulatory Visit: Payer: Medicaid Other | Admitting: Family Medicine

## 2018-10-27 ENCOUNTER — Encounter: Payer: Self-pay | Admitting: Family Medicine

## 2018-10-27 VITALS — BP 110/82 | Temp 98.8°F | Ht 63.0 in | Wt 96.2 lb

## 2018-10-27 DIAGNOSIS — R1115 Cyclical vomiting syndrome unrelated to migraine: Secondary | ICD-10-CM | POA: Diagnosis not present

## 2018-10-27 DIAGNOSIS — K219 Gastro-esophageal reflux disease without esophagitis: Secondary | ICD-10-CM | POA: Diagnosis not present

## 2018-10-27 DIAGNOSIS — F1290 Cannabis use, unspecified, uncomplicated: Secondary | ICD-10-CM

## 2018-10-27 DIAGNOSIS — R112 Nausea with vomiting, unspecified: Secondary | ICD-10-CM | POA: Insufficient documentation

## 2018-10-27 DIAGNOSIS — G43809 Other migraine, not intractable, without status migrainosus: Secondary | ICD-10-CM | POA: Diagnosis not present

## 2018-10-27 HISTORY — DX: Cannabis use, unspecified, uncomplicated: F12.90

## 2018-10-27 HISTORY — DX: Nausea with vomiting, unspecified: R11.2

## 2018-10-27 MED ORDER — FAMOTIDINE 20 MG PO TABS: 20.0000 mg | ORAL_TABLET | Freq: Two times a day (BID) | ORAL | 5 refills | Status: DC

## 2018-10-27 MED ORDER — SUMATRIPTAN SUCCINATE 50 MG PO TABS: 50.0000 mg | ORAL_TABLET | ORAL | 0 refills | Status: DC | PRN

## 2018-10-27 NOTE — Assessment & Plan Note (Signed)
Refilled famotidine

## 2018-10-27 NOTE — Patient Instructions (Addendum)
Sumatriptan tablets What is this medicine? SUMATRIPTAN (soo ma TRIP tan) is used to treat migraines with or without aura. An aura is a strange feeling or visual disturbance that warns you of an attack. It is not used to prevent migraines. This medicine may be used for other purposes; ask your health care provider or pharmacist if you have questions. COMMON BRAND NAME(S): Imitrex, Migraine Pack What should I tell my health care provider before I take this medicine? They need to know if you have any of these conditions: -cigarette smoker -circulation problems in fingers and toes -diabetes -heart disease -high blood pressure -high cholesterol -history of irregular heartbeat -history of stroke -kidney disease -liver disease -stomach or intestine problems -an unusual or allergic reaction to sumatriptan, other medicines, foods, dyes, or preservatives -pregnant or trying to get pregnant -breast-feeding How should I use this medicine? Take this medicine by mouth with a glass of water. Follow the directions on the prescription label. Do not take it more often than directed. Talk to your pediatrician regarding the use of this medicine in children. Special care may be needed. Overdosage: If you think you have taken too much of this medicine contact a poison control center or emergency room at once. NOTE: This medicine is only for you. Do not share this medicine with others. What if I miss a dose? This does not apply. This medicine is not for regular use. What may interact with this medicine? Do not take this medicine with any of the following medicines: -certain medicines for migraine headache like almotriptan, eletriptan, frovatriptan, naratriptan, rizatriptan, sumatriptan, zolmitriptan -ergot alkaloids like dihydroergotamine, ergonovine, ergotamine, methylergonovine -MAOIs like Carbex, Eldepryl, Marplan, Nardil, and Parnate This medicine may also interact with the following  medications: -certain medicines for depression, anxiety, or psychotic disorders This list may not describe all possible interactions. Give your health care provider a list of all the medicines, herbs, non-prescription drugs, or dietary supplements you use. Also tell them if you smoke, drink alcohol, or use illegal drugs. Some items may interact with your medicine. What should I watch for while using this medicine? Visit your healthcare professional for regular checks on your progress. Tell your healthcare professional if your symptoms do not start to get better or if they get worse. You may get drowsy or dizzy. Do not drive, use machinery, or do anything that needs mental alertness until you know how this medicine affects you. Do not stand up or sit up quickly, especially if you are an older patient. This reduces the risk of dizzy or fainting spells. Alcohol may interfere with the effect of this medicine. Tell your healthcare professional right away if you have any change in your eyesight. If you take migraine medicines for 10 or more days a month, your migraines may get worse. Keep a diary of headache days and medicine use. Contact your healthcare professional if your migraine attacks occur more frequently. What side effects may I notice from receiving this medicine? Side effects that you should report to your doctor or health care professional as soon as possible: -allergic reactions like skin rash, itching or hives, swelling of the face, lips, or tongue -changes in vision -chest pain or chest tightness -signs and symptoms of a dangerous change in heartbeat or heart rhythm like chest pain; dizziness; fast, irregular heartbeat; palpitations; feeling faint or lightheaded; falls; breathing problems -signs and symptoms of a stroke like changes in vision; confusion; trouble speaking or understanding; severe headaches; sudden numbness or weakness of the face,  arm or leg; trouble walking; dizziness; loss of  balance or coordination -signs and symptoms of serotonin syndrome like irritable; confusion; diarrhea; fast or irregular heartbeat; muscle twitching; stiff muscles; trouble walking; sweating; high fever; seizures; chills; vomiting Side effects that usually do not require medical attention (report to your doctor or health care professional if they continue or are bothersome): -diarrhea -dizziness -drowsiness -dry mouth -headache -nausea, vomiting -pain, tingling, numbness in the hands or feet -stomach pain This list may not describe all possible side effects. Call your doctor for medical advice about side effects. You may report side effects to FDA at 1-800-FDA-1088. Where should I keep my medicine? Keep out of the reach of children. Store at room temperature between 2 and 30 degrees C (36 and 86 degrees F). Throw away any unused medicine after the expiration date. NOTE: This sheet is a summary. It may not cover all possible information. If you have questions about this medicine, talk to your doctor, pharmacist, or health care provider.  2019 Elsevier/Gold Standard (2018-05-06 15:05:37)   Tension Headache, Adult A tension headache is a feeling of pain, pressure, or aching in the head that is often felt over the front and sides of the head. The pain can be dull, or it can feel tight (constricting). There are two types of tension headache:  Episodic tension headache. This is when the headaches happen fewer than 15 days a month.  Chronic tension headache. This is when the headaches happen more than 15 days a month during a 13-monthperiod. A tension headache can last from 30 minutes to several days. It is the most common kind of headache. Tension headaches are not normally associated with nausea or vomiting, and they do not get worse with physical activity. What are the causes? The exact cause of this condition is not known. Tension headaches are often triggered by stress, anxiety, or  depression. Other triggers include:  Alcohol.  Too much caffeine or caffeine withdrawal.  Respiratory infections, such as colds, flu, or sinus infections.  Dental problems or teeth clenching.  Tiredness (fatigue).  Holding your head and neck in the same position for a long period of time, such as while using a computer.  Smoking.  Arthritis of the neck. What are the signs or symptoms? Symptoms of this condition include:  A feeling of pressure or tightness around the head.  Dull, aching head pain.  Pain over the front and sides of the head.  Tenderness in the muscles of the head, neck, and shoulders. How is this diagnosed? This condition may be diagnosed based on your symptoms, your medical history, and a physical exam. If your symptoms are severe or unusual, you may have imaging tests, such as a CT scan or an MRI of your head. Your vision may also be checked. How is this treated? This condition may be treated with lifestyle changes and with medicines that help relieve symptoms. Follow these instructions at home: Managing pain  Take over-the-counter and prescription medicines only as told by your health care provider.  When you have a headache, lie down in a dark, quiet room.  If directed, apply ice to the head and neck: ? Put ice in a plastic bag. ? Place a towel between your skin and the bag. ? Leave the ice on for 20 minutes, 2-3 times a day.  If directed, apply heat to the back of your neck as often as told by your health care provider. Use the heat source that your health  care provider recommends, such as a moist heat pack or a heating pad. ? Place a towel between your skin and the heat source. ? Leave the heat on for 20-30 minutes. ? Remove the heat if your skin turns bright red. This is especially important if you are unable to feel pain, heat, or cold. You may have a greater risk of getting burned. Eating and drinking  Eat meals on a regular schedule.  Limit  alcohol intake to no more than 1 drink a day for nonpregnant women and 2 drinks a day for men. One drink equals 12 oz of beer, 5 oz of wine, or 1 oz of hard liquor.  Drink enough fluid to keep your urine pale yellow.  Decrease your caffeine intake, or stop using caffeine. Lifestyle  Get 7-9 hours of sleep each night, or get the amount of sleep recommended by your health care provider.  At bedtime, remove all electronic devices from your room. Electronic devices include computers, phones, and tablets.  Find ways to manage your stress. Some things that can help relieve stress include: ? Exercise. ? Deep breathing exercises. ? Yoga. ? Listening to music. ? Positive mental imagery.  Try to sit up straight and avoid tensing your muscles.  Do not use any products that contain nicotine or tobacco, such as cigarettes and e-cigarettes. If you need help quitting, ask your health care provider. General instructions   Keep all follow-up visits as told by your health care provider. This is important.  Avoid any headache triggers. Keep a headache journal to help find out what may trigger your headaches. For example, write down: ? What you eat and drink. ? How much sleep you get. ? Any change to your diet or medicines. Contact a health care provider if:  Your headache does not get better.  Your headache comes back.  You are sensitive to sounds, light, or smells because of a headache.  You have nausea or you vomit.  Your stomach hurts. Get help right away if:  You suddenly develop a very severe headache along with any of the following: ? A stiff neck. ? Nausea and vomiting. ? Confusion. ? Weakness. ? Double vision or loss of vision. ? Shortness of breath. ? Rash. ? Unusual sleepiness. ? Fever. ? Trouble speaking. ? Pain in your eyes or ears. ? Trouble walking or balancing. ? Feeling faint or passing out. Summary  A tension headache is a feeling of pain, pressure, or aching  in the head that is often felt over the front and sides of the head.  A tension headache can last from 30 minutes to several days. It is the most common kind of headache.  This condition may be diagnosed based on your symptoms, your medical history, and a physical exam.  This condition may be treated with lifestyle changes and with medicines that help relieve symptoms. This information is not intended to replace advice given to you by your health care provider. Make sure you discuss any questions you have with your health care provider. Document Released: 10/22/2005 Document Revised: 02/01/2017 Document Reviewed: 02/01/2017 Elsevier Interactive Patient Education  2019 [employer].   Analgesic Rebound Headache An analgesic rebound headache, sometimes called a medication overuse headache, is a headache that comes after pain medicine (analgesic) taken to treat the original (primary) headache has worn off. Any type of primary headache can return as a rebound headache if a person regularly takes analgesics more than three times a week to treat it.  The types of primary headaches that are commonly associated with rebound headaches include:  Migraines.  Headaches that arise from tense muscles in the head and neck area (tension headaches).  Headaches that develop and happen again (recur) on one side of the head and around the eye (cluster headaches). If rebound headaches continue, they become chronic daily headaches. What are the causes? This condition may be caused by frequent use of:  Over-the-counter medicines such as aspirin, ibuprofen, and acetaminophen.  Sinus relief medicines and other medicines that contain caffeine.  Narcotic pain medicines such as codeine and oxycodone. What are the signs or symptoms? The symptoms of a rebound headache are the same as the symptoms of the original headache. Some of the symptoms of specific types of headaches include: Migraine headache  Pulsing or  throbbing pain on one or both sides of the head.  Severe pain that interferes with daily activities.  Pain that is worsened by physical activity.  Nausea, vomiting, or both.  Pain with exposure to bright light, loud noises, or strong smells.  General sensitivity to bright light, loud noises, or strong smells.  Visual changes.  Numbness of one or both arms. Tension headache  Pressure around the head.  Dull, aching head pain.  Pain felt over the front and sides of the head.  Tenderness in the muscles of the head, neck, and shoulders. Cluster headache  Severe pain that begins in or around one eye or temple.  Redness and tearing in the eye on the same side as the pain.  Droopy or swollen eyelid.  One-sided head pain.  Nausea.  Runny nose.  Sweaty, pale facial skin.  Restlessness. How is this diagnosed? This condition is diagnosed by:  Reviewing your medical history. This includes the nature of your primary headaches.  Reviewing the types of pain medicines that you have been using to treat your headaches and how often you take them. How is this treated? This condition may be treated or managed by:  Discontinuing frequent use of the analgesic medicine. Doing this may worsen your headaches at first, but the pain should eventually become more manageable, less frequent, and less severe.  Seeing a headache specialist. He or she may be able to help you manage your headaches and help make sure there is not another cause of the headaches.  Using methods of stress relief, such as acupuncture, counseling, biofeedback, and massage. Talk with your health care provider about which methods might be good for you. Follow these instructions at home:  Take over-the-counter and prescription medicines only as told by your health care provider.  Stop the repeated use of pain medicine as told by your health care provider. Stopping can be difficult. Carefully follow instructions from  your health care provider.  Avoid triggers that are known to cause your primary headaches.  Keep all follow-up visits as told by your health care provider. This is important. Contact a health care provider if:  You continue to experience headaches after following treatments that your health care provider recommended. Get help right away if:  You develop new headache pain.  You develop headache pain that is different than what you have experienced in the past.  You develop numbness or tingling in your arms or legs.  You develop changes in your speech or vision. This information is not intended to replace advice given to you by your health care provider. Make sure you discuss any questions you have with your health care provider. Document Released: 01/12/2004 Document  Revised: 05/11/2016 Document Reviewed: 03/26/2016 Elsevier Interactive Patient Education  [employer].

## 2018-10-27 NOTE — Assessment & Plan Note (Signed)
Patient is a high ED utilizer with multiple visits for nausea and vomiting.  Discussed potential role of chronic marijuana use and how it may benefit her nausea in the short-term but cause continued issues in the long-term.  She has been previously followed by gastroenterologist and has had EGDs as well as colonoscopies that did not show any cause for her symptoms.  Advised patient to taper off marijuana trial without it for several months to see if this is the cause of her symptoms.  Patient stated that she would consider this

## 2018-10-27 NOTE — Assessment & Plan Note (Signed)
Patient has nearly daily headache around her temporal regions and associated with stress.  Discussed that this may be more consistent with tension headache rather than migraines.  Differential also includes medication overuse headache as she has been taking daily Excedrin chronically.  Discussed trial of sumatriptan to see if it will abort her headaches as well as her cyclical vomiting.

## 2018-10-27 NOTE — Progress Notes (Signed)
    Subjective:  Allison Leonard is a 42 y.o. female who presents to the [facility] today for ED follow up  HPI:  Patient states that she has had multiple ED visits for her chronic nausea/vomiting/cramping stomach pain.  She was given Zofran at her recent visit in the emergency room and subsequently developed an allergic reaction.  She is currently taking her prednisone.  Her symptoms has completely resolved and she is doing well.  She knows to avoid Zofran future.  She was instructed to carry around an EpiPen which she has with her today.  She states that she has had longstanding chronic cyclical vomiting and nausea with cramping abdominal pain.  It is better when she is on famotidine.  She needs a refill of this today.  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  She has noticed improvement in her symptoms and she cut down on her daily volume of marijuana several years ago.  She has been unable to wean herself off completely at has when she tries to do so she has worsening of her nausea.  She is aware that marijuana stays in the body for long periods of time and that it can chronically affect her cyclical vomiting.  It does not improve with hot showers  She has chronic headache that is worse around her temples and associated with stress.  She takes Ms. heparin tablet nearly every day.  She does note that it is associated with blurry vision, seeing spots, but no vision loss.  She has not had her eyes checked in some time.   ROS: Per HPI  Social Hx: She reports that she has been smoking cigarettes. She has a 3.50 pack-year smoking history. She has never used smokeless tobacco. She reports current alcohol use. She reports current drug use. Drug: Marijuana.   Objective:  Physical Exam: BP 110/82   Temp 98.8 F (37.1 C) (Oral)   Ht 5' 3"  (1.6 m)   Wt 96 lb 4 oz (43.7 kg)   BMI 17.05 kg/m   Gen: NAD, resting comfortably HEENT: [place], AT.  PERRL, EOMI.  CV: RRR with no murmurs appreciated Pulm: NWOB, CTAB with no crackles, wheezes, or rhonchi GI: Normal bowel sounds present. Soft, Nontender, Nondistended. Skin: warm, dry Neuro: grossly normal, moves all extremities Psych: Normal affect and thought content   Assessment/Plan:  Migraines Patient has nearly daily headache around her temporal regions and associated with stress.  Discussed that this may be more consistent with tension headache rather than migraines.  Differential also includes medication overuse headache as she has been taking daily Excedrin chronically.  Discussed trial of sumatriptan to see if it will abort her headaches as well as her cyclical vomiting.  Cyclical vomiting Patient is a high ED utilizer with multiple visits for nausea and vomiting.  Discussed potential role of chronic marijuana use and how it may benefit her nausea in the short-term but cause continued issues in the long-term.  She has been previously followed by gastroenterologist and has had EGDs as well as colonoscopies that did not show any cause for her symptoms.  Advised patient to taper off marijuana trial without it for several months to see if this is the cause of her symptoms.  Patient stated that she would consider this  Acid reflux Refilled famotidine   Bufford Lope, DO PGY-3, [facility] 10/27/2018 11:37 AM

## 2018-11-19 ENCOUNTER — Encounter (HOSPITAL_COMMUNITY): Payer: Self-pay | Admitting: Emergency Medicine

## 2018-11-19 ENCOUNTER — Other Ambulatory Visit: Payer: Self-pay

## 2018-11-19 ENCOUNTER — Observation Stay (HOSPITAL_COMMUNITY)
Admission: EM | Admit: 2018-11-19 | Discharge: 2018-11-20 | Disposition: A | Discharge status: Home / Self Care | Payer: Medicaid Other | Attending: Internal Medicine | Admitting: Internal Medicine

## 2018-11-19 DIAGNOSIS — R52 Pain, unspecified: Secondary | ICD-10-CM | POA: Diagnosis not present

## 2018-11-19 DIAGNOSIS — F1290 Cannabis use, unspecified, uncomplicated: Secondary | ICD-10-CM | POA: Diagnosis present

## 2018-11-19 DIAGNOSIS — K219 Gastro-esophageal reflux disease without esophagitis: Secondary | ICD-10-CM | POA: Diagnosis present

## 2018-11-19 DIAGNOSIS — Z79899 Other long term (current) drug therapy: Secondary | ICD-10-CM | POA: Diagnosis not present

## 2018-11-19 DIAGNOSIS — F17210 Nicotine dependence, cigarettes, uncomplicated: Secondary | ICD-10-CM | POA: Diagnosis not present

## 2018-11-19 DIAGNOSIS — R112 Nausea with vomiting, unspecified: Secondary | ICD-10-CM | POA: Diagnosis present

## 2018-11-19 DIAGNOSIS — R1115 Cyclical vomiting syndrome unrelated to migraine: Secondary | ICD-10-CM

## 2018-11-19 DIAGNOSIS — G43A0 Cyclical vomiting, not intractable: Secondary | ICD-10-CM | POA: Insufficient documentation

## 2018-11-19 DIAGNOSIS — E872 Acidosis, unspecified: Secondary | ICD-10-CM | POA: Diagnosis present

## 2018-11-19 DIAGNOSIS — R1013 Epigastric pain: Secondary | ICD-10-CM | POA: Diagnosis not present

## 2018-11-19 DIAGNOSIS — R1084 Generalized abdominal pain: Secondary | ICD-10-CM | POA: Diagnosis not present

## 2018-11-19 DIAGNOSIS — R1111 Vomiting without nausea: Secondary | ICD-10-CM | POA: Diagnosis not present

## 2018-11-19 DIAGNOSIS — E869 Volume depletion, unspecified: Secondary | ICD-10-CM | POA: Diagnosis not present

## 2018-11-19 DIAGNOSIS — R110 Nausea: Secondary | ICD-10-CM | POA: Diagnosis not present

## 2018-11-19 DIAGNOSIS — F1210 Cannabis abuse, uncomplicated: Secondary | ICD-10-CM | POA: Diagnosis present

## 2018-11-19 DIAGNOSIS — R000 Tachycardia, unspecified: Secondary | ICD-10-CM | POA: Diagnosis not present

## 2018-11-19 HISTORY — DX: Cyclical vomiting syndrome unrelated to migraine: R11.15

## 2018-11-19 LAB — BASIC METABOLIC PANEL
Anion gap: 13 (ref 5–15)
Anion gap: 12 (ref 5–15)
BUN: 9 mg/dL (ref 6–20)
BUN: 6 mg/dL (ref 6–20)
CALCIUM: 7.9 mg/dL — AB (ref 8.9–10.3)
CO2: 13 mmol/L — ABNORMAL LOW (ref 22–32)
CO2: 15 mmol/L — ABNORMAL LOW (ref 22–32)
Calcium: 8.1 mg/dL — ABNORMAL LOW (ref 8.9–10.3)
Chloride: 110 mmol/L (ref 98–111)
Chloride: 107 mmol/L (ref 98–111)
Creatinine, Ser: 0.53 mg/dL (ref 0.44–1.00)
Creatinine, Ser: 0.58 mg/dL (ref 0.44–1.00)
GFR calc Af Amer: >60 mL/min (ref >60)
GFR calc non Af Amer: >60 mL/min (ref >60)
Glucose, Bld: 76 mg/dL (ref 70–99)
Glucose, Bld: 88 mg/dL (ref 70–99)
Potassium: 4.4 mmol/L (ref 3.5–5.1)
Potassium: 3.6 mmol/L (ref 3.5–5.1)
Sodium: 136 mmol/L (ref 135–145)
Sodium: 134 mmol/L — ABNORMAL LOW (ref 135–145)

## 2018-11-19 LAB — URINALYSIS, ROUTINE W REFLEX MICROSCOPIC
Bilirubin Urine: NEGATIVE
Glucose, UA: NEGATIVE mg/dL
KETONES UR: 80 mg/dL — AB
Leukocytes, UA: NEGATIVE
Nitrite: NEGATIVE
Protein, ur: 30 mg/dL — AB
Specific Gravity, Urine: 1.019 (ref 1.005–1.030)
pH: 5 (ref 5.0–8.0)

## 2018-11-19 LAB — BLOOD GAS, ARTERIAL
Acid-base deficit: 14.5 mmol/L — ABNORMAL HIGH (ref 0.0–2.0)
BICARBONATE: 11.5 mmol/L — AB (ref 20.0–28.0)
Drawn by: 270211
FIO2: 0.21
O2 Saturation: 96.5 %
Patient temperature: 98.6
pCO2 arterial: 28.5 mmHg — ABNORMAL LOW (ref 32.0–48.0)
pH, Arterial: 7.232 — ABNORMAL LOW (ref 7.350–7.450)
pO2, Arterial: 99.4 mmHg (ref 83.0–108.0)

## 2018-11-19 LAB — COMPREHENSIVE METABOLIC PANEL
ALK PHOS: 70 U/L (ref 38–126)
ALT: 16 U/L (ref 0–44)
AST: 27 U/L (ref 15–41)
Albumin: 5.1 g/dL — ABNORMAL HIGH (ref 3.5–5.0)
Anion gap: 20 — ABNORMAL HIGH (ref 5–15)
BUN: 12 mg/dL (ref 6–20)
CO2: 13 mmol/L — AB (ref 22–32)
Calcium: 9.6 mg/dL (ref 8.9–10.3)
Chloride: 104 mmol/L (ref 98–111)
Creatinine, Ser: 0.7 mg/dL (ref 0.44–1.00)
GFR calc Af Amer: >60 mL/min (ref >60)
GFR calc non Af Amer: >60 mL/min (ref >60)
Glucose, Bld: 96 mg/dL (ref 70–99)
Potassium: 4.2 mmol/L (ref 3.5–5.1)
SODIUM: 137 mmol/L (ref 135–145)
Total Bilirubin: 1.2 mg/dL (ref 0.3–1.2)
Total Protein: 8.8 g/dL — ABNORMAL HIGH (ref 6.5–8.1)

## 2018-11-19 LAB — POC OCCULT BLOOD, ED: Fecal Occult Bld: NEGATIVE

## 2018-11-19 LAB — I-STAT CG4 LACTIC ACID, ED
Lactic Acid, Venous: 0.47 mmol/L — ABNORMAL LOW (ref 0.5–1.9)
Lactic Acid, Venous: 1 mmol/L (ref 0.5–1.9)

## 2018-11-19 LAB — CBC
HCT: 47.5 % — ABNORMAL HIGH (ref 36.0–46.0)
Hemoglobin: 14.3 g/dL (ref 12.0–15.0)
MCH: 26.2 pg (ref 26.0–34.0)
MCHC: 30.1 g/dL (ref 30.0–36.0)
MCV: 87.2 fL (ref 80.0–100.0)
Platelets: 286 10*3/uL (ref 150–400)
RBC: 5.45 MIL/uL — ABNORMAL HIGH (ref 3.87–5.11)
RDW: 14.1 % (ref 11.5–15.5)
WBC: 11.2 10*3/uL — ABNORMAL HIGH (ref 4.0–10.5)
nRBC: 0 % (ref 0.0–0.2)

## 2018-11-19 LAB — LIPASE, BLOOD: Lipase: 31 U/L (ref 11–51)

## 2018-11-19 MED ORDER — PANTOPRAZOLE SODIUM 40 MG IV SOLR
40.0000 mg | Freq: Two times a day (BID) | INTRAVENOUS | Status: DC
  Administered 2018-11-19 – 2018-11-20 (×2): 40 mg via INTRAVENOUS
  Filled 2018-11-19 (×2): qty 40

## 2018-11-19 MED ORDER — PANTOPRAZOLE SODIUM 40 MG IV SOLR
40.0000 mg | Freq: Once | INTRAVENOUS | Status: AC
  Administered 2018-11-19: 40 mg via INTRAVENOUS
  Filled 2018-11-19: qty 40

## 2018-11-19 MED ORDER — SODIUM CHLORIDE 0.9 % IV BOLUS
1000.0000 mL | Freq: Once | INTRAVENOUS | Status: AC
  Administered 2018-11-19: 1000 mL via INTRAVENOUS

## 2018-11-19 MED ORDER — METOCLOPRAMIDE HCL 5 MG/ML IJ SOLN
10.0000 mg | Freq: Four times a day (QID) | INTRAMUSCULAR | Status: DC | PRN
  Administered 2018-11-19: 10 mg via INTRAVENOUS
  Filled 2018-11-19: qty 2

## 2018-11-19 MED ORDER — PROMETHAZINE HCL 25 MG PO TABS: 25.0000 mg | ORAL_TABLET | Freq: Four times a day (QID) | ORAL | 0 refills | Status: DC | PRN

## 2018-11-19 MED ORDER — ASPIRIN-ACETAMINOPHEN-CAFFEINE 250-250-65 MG PO TABS
1.0000 | ORAL_TABLET | ORAL | Status: DC | PRN
  Administered 2018-11-19 (×2): 1 via ORAL
  Filled 2018-11-19 (×4): qty 1

## 2018-11-19 MED ORDER — PROMETHAZINE HCL 25 MG PO TABS: 12.5000 mg | ORAL_TABLET | Freq: Four times a day (QID) | ORAL | Status: DC | PRN

## 2018-11-19 MED ORDER — KETOROLAC TROMETHAMINE 30 MG/ML IJ SOLN
30.0000 mg | Freq: Once | INTRAMUSCULAR | Status: AC
  Administered 2018-11-19: 30 mg via INTRAVENOUS
  Filled 2018-11-19: qty 1

## 2018-11-19 MED ORDER — SODIUM BICARBONATE 650 MG PO TABS
650.0000 mg | ORAL_TABLET | Freq: Two times a day (BID) | ORAL | Status: DC
  Administered 2018-11-19 – 2018-11-20 (×2): 650 mg via ORAL
  Filled 2018-11-19 (×2): qty 1

## 2018-11-19 MED ORDER — SODIUM BICARBONATE 8.4 % IV SOLN
50.0000 meq | Freq: Once | INTRAVENOUS | Status: AC
  Administered 2018-11-19: 50 meq via INTRAVENOUS
  Filled 2018-11-19: qty 50

## 2018-11-19 MED ORDER — DICYCLOMINE HCL 20 MG PO TABS
20.0000 mg | ORAL_TABLET | Freq: Three times a day (TID) | ORAL | Status: DC
  Administered 2018-11-19 – 2018-11-20 (×4): 20 mg via ORAL
  Filled 2018-11-19 (×4): qty 1

## 2018-11-19 MED ORDER — FAMOTIDINE IN NACL 20-0.9 MG/50ML-% IV SOLN
20.0000 mg | Freq: Once | INTRAVENOUS | Status: AC
  Administered 2018-11-19: 20 mg via INTRAVENOUS
  Filled 2018-11-19: qty 50

## 2018-11-19 MED ORDER — METOCLOPRAMIDE HCL 5 MG/ML IJ SOLN
10.0000 mg | Freq: Once | INTRAMUSCULAR | Status: AC
  Administered 2018-11-19: 10 mg via INTRAVENOUS
  Filled 2018-11-19: qty 2

## 2018-11-19 MED ORDER — SODIUM CHLORIDE 0.9 % IV SOLN
INTRAVENOUS | Status: DC
  Administered 2018-11-19 – 2018-11-20 (×3): via INTRAVENOUS

## 2018-11-19 MED ORDER — SODIUM CHLORIDE 0.9 % IV BOLUS
2000.0000 mL | Freq: Once | INTRAVENOUS | Status: AC
  Administered 2018-11-19: 2000 mL via INTRAVENOUS

## 2018-11-19 MED ORDER — POLYETHYLENE GLYCOL 3350 17 G PO PACK
17.0000 g | PACK | Freq: Every day | ORAL | Status: DC | PRN
  Administered 2018-11-19: 17 g via ORAL
  Filled 2018-11-19: qty 1

## 2018-11-19 NOTE — ED Notes (Signed)
RT called for ABG.

## 2018-11-19 NOTE — ED Notes (Signed)
ED TO INPATIENT HANDOFF REPORT  Name/Age/Gender Allison Leonard 43 y.o. female  Code Status   Home/SNF/Other Home  Chief Complaint abd pain, n/v  Level of Care/Admitting Diagnosis ED Disposition    ED Disposition Condition [facility] Area: [place] [[id number]]  Level of Care: Med-Surg [16]  Diagnosis: Nausea and vomiting [270350]  Admitting Physician: Flora Lipps [[id number]]  Attending Physician: Flora Lipps [[id number]]  PT Class (Do Not Modify): Observation [104]  PT Acc Code (Do Not Modify): Observation [10022]       Medical History Past Medical History:  Diagnosis Date  . Acid reflux 07/04/2018  . Cyclic vomiting syndrome   . Generalized anxiety disorder 07/04/2018  . History of colon polyps    last colonoscopy 2018 removed multiple benign polyps  . Migraines 07/04/2018    Allergies Allergies  Allergen Reactions  . Zofran [Ondansetron Hcl] Anaphylaxis  . Morphine And Related Itching    IV Location/Drains/Wounds Patient Lines/Drains/Airways Status   Active Line/Drains/Airways    Name:   Placement date:   Placement time:   Site:   Days:   Peripheral IV 11/19/18 Left;Posterior Forearm   11/19/18    0408    Forearm   less than 1          Labs/Imaging Results for orders placed or performed during the hospital encounter of 11/19/18 (from the past 48 hour(s))  Lipase, blood     Status: None   Collection Time: 11/19/18  4:01 AM  Result Value Ref Range   Lipase 31 11 - 51 U/L    Comment: Performed at [facility], [place] [address] [place], [place] [address]  Comprehensive metabolic panel     Status: Abnormal   Collection Time: 11/19/18  4:01 AM  Result Value Ref Range   Sodium 137 135 - 145 mmol/L   Potassium 4.2 3.5 - 5.1 mmol/L   Chloride 104 98 - 111 mmol/L   CO2 13 (L) 22 - 32 mmol/L   Glucose, Bld 96 70 - 99 mg/dL   BUN 12 6 - 20 mg/dL   Creatinine, Ser 0.70 0.44 - 1.00 mg/dL   Calcium 9.6 8.9 - 10.3 mg/dL   Total Protein 8.8 (H) 6.5 - 8.1 g/dL   Albumin 5.1 (H) 3.5 - 5.0 g/dL   AST 27 15 - 41 U/L   ALT 16 0 - 44 U/L   Alkaline Phosphatase 70 38 - 126 U/L   Total Bilirubin 1.2 0.3 - 1.2 mg/dL   GFR calc non Af Amer >60 >60 mL/min   GFR calc Af Amer >60 >60 mL/min   Anion gap 20 (H) 5 - 15    Comment: Performed at [facility], [place] [address], [place], [place] [address]  CBC     Status: Abnormal   Collection Time: 11/19/18  4:01 AM  Result Value Ref Range   WBC 11.2 (H) 4.0 - 10.5 K/uL   RBC 5.45 (H) 3.87 - 5.11 MIL/uL   Hemoglobin 14.3 12.0 - 15.0 g/dL   HCT 47.5 (H) 36.0 - 46.0 %   MCV 87.2 80.0 - 100.0 fL   MCH 26.2 26.0 - 34.0 pg   MCHC 30.1 30.0 - 36.0 g/dL   RDW 14.1 11.5 - 15.5 %   Platelets 286 150 - 400 K/uL   nRBC 0.0 0.0 - 0.2 %    Comment: Performed at [facility], [place] [address], [place], [place] [address]  Urinalysis, Routine w reflex microscopic     Status: Abnormal   Collection Time: 11/19/18  5:39 AM  Result Value Ref Range   Color, Urine YELLOW YELLOW   APPearance CLEAR CLEAR   Specific Gravity, Urine 1.019 1.005 - 1.030   pH 5.0 5.0 - 8.0   Glucose, UA NEGATIVE NEGATIVE mg/dL   Hgb urine dipstick MODERATE (A) NEGATIVE   Bilirubin Urine NEGATIVE NEGATIVE   Ketones, ur 80 (A) NEGATIVE mg/dL   Protein, ur 30 (A) NEGATIVE mg/dL   Nitrite NEGATIVE NEGATIVE   Leukocytes, UA NEGATIVE NEGATIVE   RBC / HPF 0-5 0 - 5 RBC/hpf   WBC, UA 0-5 0 - 5 WBC/hpf   Bacteria, UA RARE (A) NONE SEEN   Squamous Epithelial / LPF 0-5 0 - 5   Mucus PRESENT     Comment: Performed at [facility], [place] [address] [place], [place] [address]  Basic metabolic panel     Status: Abnormal   Collection Time: 11/19/18  6:30 AM  Result Value Ref Range   Sodium 136 135 - 145 mmol/L   Potassium 4.4 3.5 - 5.1 mmol/L   Chloride 110 98 - 111 mmol/L   CO2 13 (L) 22 - 32 mmol/L   Glucose, Bld 76 70 - 99 mg/dL    BUN 9 6 - 20 mg/dL   Creatinine, Ser 0.53 0.44 - 1.00 mg/dL   Calcium 7.9 (L) 8.9 - 10.3 mg/dL   GFR calc non Af Amer >60 >60 mL/min   GFR calc Af Amer >60 >60 mL/min   Anion gap 13 5 - 15    Comment: Performed at [facility], [place] [address], [place], [place] [address]  I-Stat CG4 Lactic Acid, ED     Status: Abnormal   Collection Time: 11/19/18  8:47 AM  Result Value Ref Range   Lactic Acid, Venous 0.47 (L) 0.5 - 1.9 mmol/L  Blood gas, arterial ([facility] & [facility] ONLY)     Status: Abnormal   Collection Time: 11/19/18  9:50 AM  Result Value Ref Range   FIO2 0.21    pH, Arterial 7.232 (L) 7.350 - 7.450   pCO2 arterial 28.5 (L) 32.0 - 48.0 mmHg   pO2, Arterial 99.4 83.0 - 108.0 mmHg   Bicarbonate 11.5 (L) 20.0 - 28.0 mmol/L   Acid-base deficit 14.5 (H) 0.0 - 2.0 mmol/L   O2 Saturation 96.5 %   Patient temperature 98.6    Collection site LEFT RADIAL    Drawn by [id number]    Sample type ARTERIAL DRAW    Allens test (pass/fail) PASS PASS    Comment: Performed at [facility], [place] [address] [place], [place] [address]  POC occult blood, ED RN will collect     Status: None   Collection Time: 11/19/18 10:05 AM  Result Value Ref Range   Fecal Occult Bld NEGATIVE NEGATIVE  I-Stat CG4 Lactic Acid, ED     Status: None   Collection Time: 11/19/18 10:07 AM  Result Value Ref Range   Lactic Acid, Venous 1.00 0.5 - 1.9 mmol/L   No results found. None  Pending Labs [employer] (From admission, onward)    Start     Ordered   Signed and Held  HIV antibody (Routine Testing)  Once,   R     Signed and Held   Signed and Held  Comprehensive metabolic panel  Tomorrow morning,   R     Signed and Held  Signed and Held  CBC  Tomorrow morning,   R     Signed and Held   Signed and Held  Basic metabolic panel  Once-Timed,   R     Signed and Held          Vitals/Pain Today's Vitals   11/19/18 1254 11/19/18 1320 11/19/18 1400 11/19/18 1500  BP:  128/86 138/84  132/75  Pulse:  (!) 101 96 97  Resp:  19    Temp:      TempSrc:      SpO2:  97% 98% 98%  Weight:      Height:      PainSc: 6        Isolation Precautions No active isolations  Medications Medications  0.9 %  sodium chloride infusion ( Intravenous New Bag/Given 11/19/18 1301)  metoCLOPramide (REGLAN) injection 10 mg (10 mg Intravenous Given 11/19/18 1254)  pantoprazole (PROTONIX) injection 40 mg (0 mg Intravenous Hold 11/19/18 1302)  sodium chloride 0.9 % bolus 2,000 mL (0 mLs Intravenous Stopped 11/19/18 0657)  metoCLOPramide (REGLAN) injection 10 mg (10 mg Intravenous Given 11/19/18 0458)  ketorolac (TORADOL) 30 MG/ML injection 30 mg (30 mg Intravenous Given 11/19/18 0458)  famotidine (PEPCID) IVPB 20 mg premix (0 mg Intravenous Stopped 11/19/18 0531)  sodium chloride 0.9 % bolus 1,000 mL (0 mLs Intravenous Stopped 11/19/18 0840)  sodium chloride 0.9 % bolus 1,000 mL (0 mLs Intravenous Stopped 11/19/18 1007)  pantoprazole (PROTONIX) injection 40 mg (40 mg Intravenous Given 11/19/18 0959)  sodium bicarbonate injection 50 mEq (50 mEq Intravenous Given 11/19/18 1254)    Mobility walks

## 2018-11-19 NOTE — ED Provider Notes (Signed)
[facility] DEPT Provider Note   CSN: [id number] Arrival date & time: 11/19/18  0348     History   Chief Complaint Chief Complaint  Patient presents with  . Abdominal Pain  . Emesis    HPI Allison Leonard is a 43 y.o. female.   43 year old female with a history of generalized anxiety disorder, migraines, acid reflux presents to the emergency department for abdominal pain.  She reports sharp pain in her epigastrium which radiates towards the midline of her abdomen.  Symptoms began on Sunday and have been fairly constant.  She has had similar complaints of pain in the past with associated nausea and vomiting.  She has been unable to tolerate her daily medications for the past 2 days secondary to persistent emesis.  She denies any blood in her emesis.  No fevers, urinary symptoms.  She has a history of chronic marijuana use.  Has been compliant with her famotidine until onset of her pain and vomiting.  No history of abdominal surgeries.  The history is provided by the patient. No language interpreter was used.  Abdominal Pain  Associated symptoms: vomiting   Emesis  Associated symptoms: abdominal pain     Past Medical History:  Diagnosis Date  . Acid reflux 07/04/2018  . Generalized anxiety disorder 07/04/2018  . History of colon polyps    last colonoscopy 2018 removed multiple benign polyps  . Migraines 07/04/2018    Patient Active Problem List   Diagnosis Date Noted  . Cyclical vomiting 65/01/5464  . Stress 07/04/2018  . Acid reflux 07/04/2018  . Migraines 07/04/2018    Past Surgical History:  Procedure Laterality Date  . ABDOMINAL HYSTERECTOMY  2006  . APPENDECTOMY  1999     OB History   No obstetric history on file.      Home Medications    Prior to Admission medications   Medication Sig Start Date End Date Taking? Authorizing Provider  aspirin-acetaminophen-caffeine (EXCEDRIN MIGRAINE) [id number] MG tablet Take 1  tablet by mouth every 4 (four) hours as needed for headache.   Yes [provider]  famotidine (PEPCID) 20 MG tablet Take 1 tablet (20 mg total) by mouth 2 (two) times daily. 10/27/18  Yes Orson Eva J, DO  pantoprazole (PROTONIX) 40 MG tablet Take 40 mg by mouth daily.   Yes [provider]  SUMAtriptan (IMITREX) 50 MG tablet Take 1 tablet (50 mg total) by mouth every 2 (two) hours as needed for migraine. May repeat in 2 hours if headache persists or recurs. Patient not taking: Reported on 11/19/2018 10/27/18   Bufford Lope, DO    Family History Family History  Problem Relation Age of Onset  . Diabetes Father   . Hypertension Father   . Hyperlipidemia Father   . Heart attack Maternal Grandmother   . Heart attack Paternal Grandmother   . Cervical cancer Maternal Aunt   . Throat cancer Maternal Uncle   . Stroke Maternal Uncle   . Down syndrome Son     Social History Social History   Tobacco Use  . Smoking status: Current Every Day Smoker    Packs/day: 0.50    Years: 7.00    Pack years: 3.50    Types: Cigarettes  . Smokeless tobacco: Never Used  Substance Use Topics  . Alcohol use: Yes  . Drug use: Yes    Types: Marijuana     Allergies   Zofran [ondansetron hcl] and Morphine and related   Review  of Systems Review of Systems  Gastrointestinal: Positive for abdominal pain and vomiting.  Ten systems reviewed and are negative for acute change, except as noted in the HPI.    Physical Exam Updated Vital Signs BP (!) 149/92 (BP Location: Left Arm)   Pulse 97   Temp 98.6 F (37 C) (Oral)   Resp 15   Ht 5' 4"  (1.626 m)   Wt 44 kg   SpO2 99%   BMI 16.65 kg/m   Physical Exam Vitals signs and nursing note reviewed.  Constitutional:      General: She is not in acute distress.    Appearance: She is well-developed. She is not diaphoretic.     Comments: Nontoxic appearing and in NAD  HENT:     Head: Normocephalic and atraumatic.  Eyes:     General:  No scleral icterus.    Conjunctiva/sclera: Conjunctivae normal.  Neck:     Musculoskeletal: Normal range of motion.  Cardiovascular:     Rate and Rhythm: Regular rhythm. Tachycardia present.     Pulses: Normal pulses.  Pulmonary:     Effort: Pulmonary effort is normal. No respiratory distress.     Breath sounds: No stridor. No wheezing or rales.     Comments: Respirations even and unlabored Abdominal:     Comments: Soft, generally tender abdomen without focality. No distension or peritoneal signs.  Musculoskeletal: Normal range of motion.  Skin:    General: Skin is warm and dry.     Coloration: Skin is not pale.     Findings: No erythema or rash.  Neurological:     Mental Status: She is alert and oriented to person, place, and time.  Psychiatric:        Behavior: Behavior normal.      ED Treatments / Results  Labs (all labs ordered are listed, but only abnormal results are displayed) Labs Reviewed  COMPREHENSIVE METABOLIC PANEL - Abnormal; Notable for the following components:      Result Value   CO2 13 (*)    Total Protein 8.8 (*)    Albumin 5.1 (*)    Anion gap 20 (*)    All other components within normal limits  CBC - Abnormal; Notable for the following components:   WBC 11.2 (*)    RBC 5.45 (*)    HCT 47.5 (*)    All other components within normal limits  URINALYSIS, ROUTINE W REFLEX MICROSCOPIC - Abnormal; Notable for the following components:   Hgb urine dipstick MODERATE (*)    Ketones, ur 80 (*)    Protein, ur 30 (*)    Bacteria, UA RARE (*)    All other components within normal limits  LIPASE, BLOOD  BASIC METABOLIC PANEL    EKG None  Radiology No results found.  Procedures Procedures (including critical care time)  Medications Ordered in ED Medications  sodium chloride 0.9 % bolus 2,000 mL (2,000 mLs Intravenous New Bag/Given 11/19/18 0457)  metoCLOPramide (REGLAN) injection 10 mg (10 mg Intravenous Given 11/19/18 0458)  ketorolac (TORADOL) 30  MG/ML injection 30 mg (30 mg Intravenous Given 11/19/18 0458)  famotidine (PEPCID) IVPB 20 mg premix (20 mg Intravenous New Bag/Given 11/19/18 0501)    Initial Impression / Assessment and Plan / ED Course  I have reviewed the triage vital signs and the nursing notes.  Pertinent labs & imaging results that were available during my care of the patient were reviewed by me and considered in my medical decision making (see chart  for details).     36:75 AM 43 year old female with a history of cyclic nausea and vomiting with associated epigastric pain presents for similar complaints which began on Sunday.  Reports too numerous to count episodes of vomiting with inability to tolerate p.o. fluids.  She has been seen numerous times in the emergency department for similar complaints, mostly noted in [place] prior to 2018.  She has a generally tender abdomen without focality.  Will obtain labs and medically manage with fluids as well as Pepcid, Reglan, Toradol.  Will monitor and reassess.  5:12 AM Patient with low bicarb resulting in elevated anion gap.  This is suspected to be secondary to dehydration from persistent vomiting over the past 48 hours.  Will require repeat metabolic panel when fluids completed if symptoms improve and patient with potential for discharge.  5:44 AM The patient has been reassessed.  She has no complaints of nausea.  Her vomiting has subsided.  States that her abdominal pain has improved.  Presently describes her discomfort as "soreness".  Have discussed the plan for repeat metabolic panel.  If improving and able to tolerate oral fluids, I believe the patient is appropriate for outpatient GI follow-up.  Patient signed out to Martinique Robinson, PA-C at shift change who will assume care and disposition appropriately.   Final Clinical Impressions(s) / ED Diagnoses   Final diagnoses:  Nausea and vomiting in adult  Epigastric pain    ED Discharge Orders    None         Antonietta Breach, PA-C 11/19/18 6808    Rolland Porter, MD 11/19/18 0700    Rolland Porter, MD 11/19/18 [phone]

## 2018-11-19 NOTE — ED Triage Notes (Signed)
Pt bib EMS and presents with abd pain, n/v that began on Sunday.  Pt is in the epigastric pain that radiates in the midline.  Pt had similar issue in December and was told to follow up with a GI MD which pt did not. Pt a/o x 4 and ambulatory.

## 2018-11-19 NOTE — H&P (Addendum)
[facility] History and Physical  Allison Leonard CWC:[id number] DOB: 10-18-1976 DOA: 11/19/2018  Referring physician: ED  PCP: Bufford Lope, DO   Chief Complaint: Nausea, vomiting abdominal pain  HPI: Allison Leonard is a 43 y.o. female with past medical history of chronic abdominal pain, chronic marijuana abuse, cyclical nausea and vomiting to the hospital with complaints of nausea vomiting and chronic epigastric pain.  Patient stated that since Saturday she has not had a bowel movement but has been having some epigastric discomfort.  She gets this episodes every couple of months.  She however admitted that she has quit smoking and drinking recently.  Patient denies any cough, runny nose sore throat fever or chills.  Denies any dizziness lightheadedness or syncope.  Denies any urinary urgency, frequency or dysuria.  Denied any recent travel or sick contacts.  ED Course: In the ED, patient was noted to have leukocytosis of 11.2 with normal hemoglobin.  Metabolic panel showed a low bicarb despite IV fluid resuscitation.  Initially anion gap was 20 which is closed but her bicarb did not improve.  Patient received liters IV fluid bolus, Protonix, Toradol, Pepcid Reglan.  Venous lactic acid was 0.47.  Patient was then considered for observation in the hospital  Review of Systems:  All systems were reviewed and were negative unless otherwise mentioned in the HPI  Past Medical History:  Diagnosis Date  . Acid reflux 07/04/2018  . Cyclic vomiting syndrome   . Generalized anxiety disorder 07/04/2018  . History of colon polyps    last colonoscopy 2018 removed multiple benign polyps  . Migraines 07/04/2018   Past Surgical History:  Procedure Laterality Date  . ABDOMINAL HYSTERECTOMY  2006  . APPENDECTOMY  1999    Social History:  reports that she has been smoking cigarettes. She has a 3.50 pack-year smoking history. She has never used smokeless tobacco. She reports  current alcohol use. She reports current drug use. Drug: Marijuana.  Allergies  Allergen Reactions  . Zofran [Ondansetron Hcl] Anaphylaxis  . Morphine And Related Itching    Family History  Problem Relation Age of Onset  . Diabetes Father   . Hypertension Father   . Hyperlipidemia Father   . Heart attack Maternal Grandmother   . Heart attack Paternal Grandmother   . Cervical cancer Maternal Aunt   . Throat cancer Maternal Uncle   . Stroke Maternal Uncle   . Down syndrome Son    Prior to Admission medications   Medication Sig Start Date End Date Taking? Authorizing Provider  aspirin-acetaminophen-caffeine (EXCEDRIN MIGRAINE) [phone] MG tablet Take 1 tablet by mouth every 4 (four) hours as needed for headache.   Yes [provider]  famotidine (PEPCID) 20 MG tablet Take 1 tablet (20 mg total) by mouth 2 (two) times daily. 10/27/18  Yes Orson Eva J, DO  pantoprazole (PROTONIX) 40 MG tablet Take 40 mg by mouth daily.   Yes [provider]  promethazine (PHENERGAN) 25 MG tablet Take 1 tablet (25 mg total) by mouth every 6 (six) hours as needed for nausea or vomiting. 11/19/18   Antonietta Breach, PA-C  SUMAtriptan (IMITREX) 50 MG tablet Take 1 tablet (50 mg total) by mouth every 2 (two) hours as needed for migraine. May repeat in 2 hours if headache persists or recurs. Patient not taking: Reported on 11/19/2018 10/27/18   Bufford Lope, DO   Physical Exam: Vitals:   11/19/18 0352 11/19/18 0504 11/19/18 0700 11/19/18 0917  BP:  (!) 149/92  140/87 139/83  Pulse:  97 99 99  Resp:  15 16 17   Temp:      TempSrc:      SpO2:  99% 99% 100%  Weight: 44 kg     Height: 5' 4"  (1.626 m)       Wt Readings from Last 3 Encounters:  11/19/18 44 kg  10/27/18 43.7 kg  10/17/18 44.5 kg    General:  Average built, not in obvious distress.  Thinly built HENT: Normocephalic, pupils equally reacting to light and accommodation.  No scleral pallor or icterus noted. Oral mucosa is  dry Chest:  Clear breath sounds.  Diminished breath sounds bilaterally. No crackles or wheezes.  CVS: S1 &S2 heard. No murmur.  Regular rate and rhythm. Abdomen: Soft, mildly tender epigastric region, nondistended.  Bowel sounds are heard.  Liver is not palpable, no abdominal mass palpated Extremities: No cyanosis, clubbing or edema.  Peripheral pulses are palpable. Psych: Alert, awake and oriented, normal mood CNS:  No cranial nerve deficits.  Power equal in all extremities.   No cerebellar signs.   Skin: Warm and dry.  No rashes noted.  Labs on Admission:  Basic Metabolic Panel: Recent Labs  Lab 11/19/18 0401 11/19/18 0630  NA 137 136  K 4.2 4.4  CL 104 110  CO2 13* 13*  GLUCOSE 96 76  BUN 12 9  CREATININE 0.70 0.53  CALCIUM 9.6 7.9*   Liver Function Tests: Recent Labs  Lab 11/19/18 0401  AST 27  ALT 16  ALKPHOS 70  BILITOT 1.2  PROT 8.8*  ALBUMIN 5.1*   Recent Labs  Lab 11/19/18 0401  LIPASE 31   No results for input(s): AMMONIA in the last 168 hours. CBC: Recent Labs  Lab 11/19/18 0401  WBC 11.2*  HGB 14.3  HCT 47.5*  MCV 87.2  PLT 286   Cardiac Enzymes: No results for input(s): CKTOTAL, CKMB, CKMBINDEX, TROPONINI in the last 168 hours.  BNP (last 3 results) No results for input(s): BNP in the last 8760 hours.  ProBNP (last 3 results) No results for input(s): PROBNP in the last 8760 hours.  CBG: No results for input(s): GLUCAP in the last 168 hours.  Radiological Exams on Admission: No results found.  EKG: None available for review  Assessment/Plan Principal Problem:   Volume depletion Active Problems:   Acid reflux   Cyclical vomiting   Metabolic acidosis   Cannabis abuse  Will place the patient in observation for,  Nausea vomiting abdominal pain likely cyclical vomiting.  Patient states that she has quit marijuana alcohol and smoking recently.  Patient received multiple doses of occasions needed.  We will continue supportive care.   Check urinalysis with no evidence of infection.  Check right upper quadrant ultrasound.  Lipase was within normal limits  Leukocytosis could be reactive.  Check CBC in a.m.  Urinalysis  Volume depletion secondary to nausea vomiting.  Patient does have a urinary ketones and is clinically dehydrated will continue on IV fluid hydration for now.  Patient on clear liquid diet  History of GERD.  Continue on Protonix.  Metabolic acidosis initially anion gap subsequently non-anion gap.  No improvement despite IV fluid hydration.  Will get ABG.  We will continue to monitor closely.  No recent history of diarrhea.  No alcohol ingestion.  Lactic acid was not elevated.  History of polysubstance abuse including cannabis, tobacco and alcohol.  Patient states that she has recently not used it.  Consultant none  Code  Status: Full  DVT Prophylaxis: None  Family Communication:  Patients condition and plan of care including tests being ordered have been discussed with the patient and family at bedside who indicate understanding and agree with the plan.  Disposition Plan: Home  Severity of Illness: The appropriate patient status for this patient is OBSERVATION. Observation status is judged to be reasonable and necessary in order to provide the required intensity of service to ensure the patient's safety. The patient's presenting symptoms, physical exam findings, and initial radiographic and laboratory data in the context of their medical condition is felt to place them at decreased risk for further clinical deterioration. Furthermore, it is anticipated that the patient will be medically stable for discharge from the hospital within 2 midnights of admission.   Signed, Flora Lipps, MD [facility]

## 2018-11-19 NOTE — Discharge Instructions (Signed)
Continue your Pepcid as previously prescribed by your primary care doctor.  You have been prescribed Phenergan to take as needed for persistent nausea/vomiting.  Drink clear liquids to prevent dehydration.  Limit your marijuana use as this may be contributing to your symptoms.  We advise follow-up with your primary doctor as well as a GI specialist.  Call the office of [facility] to schedule an appointment for the near future.  You may return to the ED for new or concerning symptoms.

## 2018-11-19 NOTE — ED Provider Notes (Signed)
Care assumed at shift change from [facility], pending repeat metabolic panel.  See her note for full HPI and work-up.  Briefly, patient with history of cyclical nausea vomiting with chronic epigastric abdominal pain.  Presenting today for similar symptoms.  CB with leukocytosis of 11.2, normal hemoglobin.  Metabolic panel with low bicarb of 13 and anion gap of 20.  Patient hydrated with 2 L of fluid, vomiting resolved with Reglan.  Pain treated with Toradol.  Plan to recheck BMP after fluids for improvement.  If bicarb and gap with improvement, patient likely safe for discharge with GI follow-up. Physical Exam  BP 140/87 (BP Location: Left Arm)   Pulse 99   Temp 98.6 F (37 C) (Oral)   Resp 16   Ht 5' 4"  (1.626 m)   Wt 44 kg   SpO2 99%   BMI 16.65 kg/m   Physical Exam Vitals signs and nursing note reviewed.  Constitutional:      Appearance: She is well-developed.     Comments: Thin appearing.  No apparent distress.  HENT:     Head: Normocephalic and atraumatic.  Eyes:     Conjunctiva/sclera: Conjunctivae normal.  Cardiovascular:     Rate and Rhythm: Normal rate.  Pulmonary:     Effort: Pulmonary effort is normal.  Abdominal:     Palpations: Abdomen is soft.  Skin:    General: Skin is warm.  Neurological:     Mental Status: She is alert.  Psychiatric:        Behavior: Behavior normal.     ED Course/Procedures   Clinical Course as of Nov 19 913  Wed Nov 19, 2018  0812 Repeat BMP without improvement to bicarb. Anion gap normalized. Pt discussed with Dr. Tomi Bamberger. Lactic ordered. Pt reports improvement in nausea. Now tells me she had an episode of hematemesis described as a few small clots. Only 1 episode. Will admit for IV hydration for acidosis.   [JR]    Clinical Course User Index [JR] Charlton Boule, [place] N, PA-C    Procedures  MDM  Repeat metabolic panel with unchanged bicarb, however gap improved to 13.  Per chart review, 1 month ago bicarb was 27.  This appears to be  acute.  On evaluation, patient reports improvement in symptoms, however states she feels weak.  She reports to Dr. Tomi Bamberger that she had one episode of hematemesis prior to arrival, described as a few small blood clots.  Has not had recurrence of symptoms.  Another liter of fluid, IV Protonix ordered.   Discussed recommendation for admission for acidosis for hydration.  Hospitalist consulted for admission.  The patient appears reasonably stabilized for admission considering the current resources, flow, and capabilities available in the ED at this time, and I doubt any other [facility] requiring further screening and/or treatment in the ED prior to admission.       Alyana Kreiter, [place] N, PA-C 11/19/18 8453    Rolland Porter, MD 11/19/18 [phone]

## 2018-11-20 ENCOUNTER — Observation Stay (HOSPITAL_COMMUNITY): Payer: Medicaid Other

## 2018-11-20 DIAGNOSIS — R112 Nausea with vomiting, unspecified: Secondary | ICD-10-CM | POA: Diagnosis not present

## 2018-11-20 DIAGNOSIS — E869 Volume depletion, unspecified: Secondary | ICD-10-CM | POA: Diagnosis not present

## 2018-11-20 LAB — COMPREHENSIVE METABOLIC PANEL
ALK PHOS: 46 U/L (ref 38–126)
ALT: 12 U/L (ref 0–44)
AST: 18 U/L (ref 15–41)
Albumin: 3.5 g/dL (ref 3.5–5.0)
Anion gap: 10 (ref 5–15)
BUN: <5 mg/dL — ABNORMAL LOW (ref 6–20)
CO2: 19 mmol/L — ABNORMAL LOW (ref 22–32)
Calcium: 8.2 mg/dL — ABNORMAL LOW (ref 8.9–10.3)
Chloride: 109 mmol/L (ref 98–111)
Creatinine, Ser: 0.5 mg/dL (ref 0.44–1.00)
GFR calc Af Amer: >60 mL/min (ref >60)
GFR calc non Af Amer: >60 mL/min (ref >60)
Glucose, Bld: 77 mg/dL (ref 70–99)
Potassium: 3.8 mmol/L (ref 3.5–5.1)
Sodium: 138 mmol/L (ref 135–145)
Total Bilirubin: 1.1 mg/dL (ref 0.3–1.2)
Total Protein: 6 g/dL — ABNORMAL LOW (ref 6.5–8.1)

## 2018-11-20 LAB — HIV ANTIBODY (ROUTINE TESTING W REFLEX): HIV Screen 4th Generation wRfx: NONREACTIVE

## 2018-11-20 LAB — CBC
HCT: 34.5 % — ABNORMAL LOW (ref 36.0–46.0)
Hemoglobin: 10.7 g/dL — ABNORMAL LOW (ref 12.0–15.0)
MCH: 26.5 pg (ref 26.0–34.0)
MCHC: 31 g/dL (ref 30.0–36.0)
MCV: 85.4 fL (ref 80.0–100.0)
Platelets: 209 10*3/uL (ref 150–400)
RBC: 4.04 MIL/uL (ref 3.87–5.11)
RDW: 14.1 % (ref 11.5–15.5)
WBC: 7.4 10*3/uL (ref 4.0–10.5)
nRBC: 0 % (ref 0.0–0.2)

## 2018-11-20 IMAGING — US US ABDOMEN LIMITED
1 series · 14 of 25 positions shown · non-contrast
Comparison: None.

CLINICAL DATA: Nausea and vomiting

EXAM:
ULTRASOUND ABDOMEN LIMITED RIGHT UPPER QUADRANT

[Series 1: us abdomen limited · 14 of 51 slices shown]
[im 1/51]
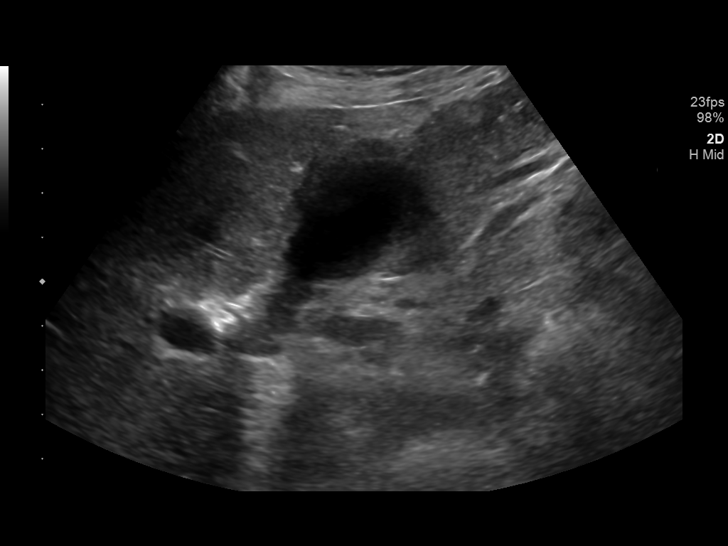
[im 5/51]
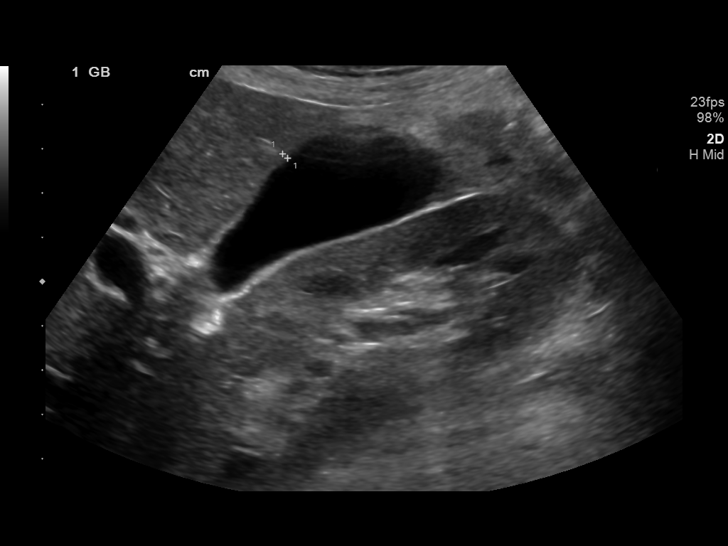
[im 9/51]
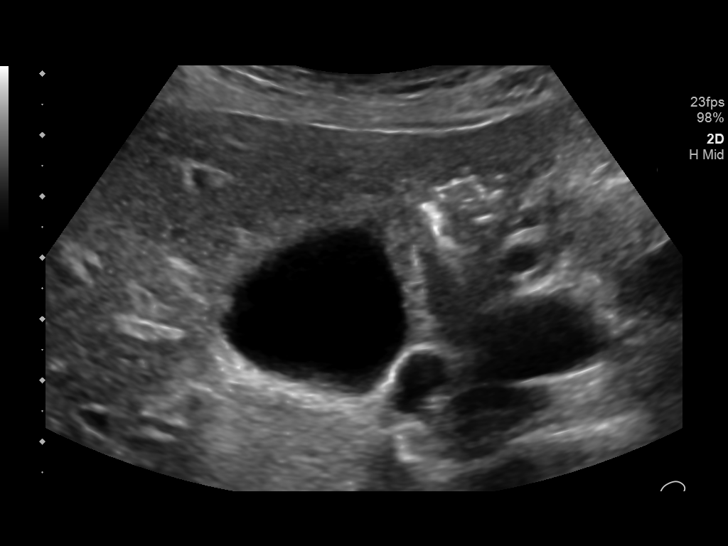
[im 13/51]
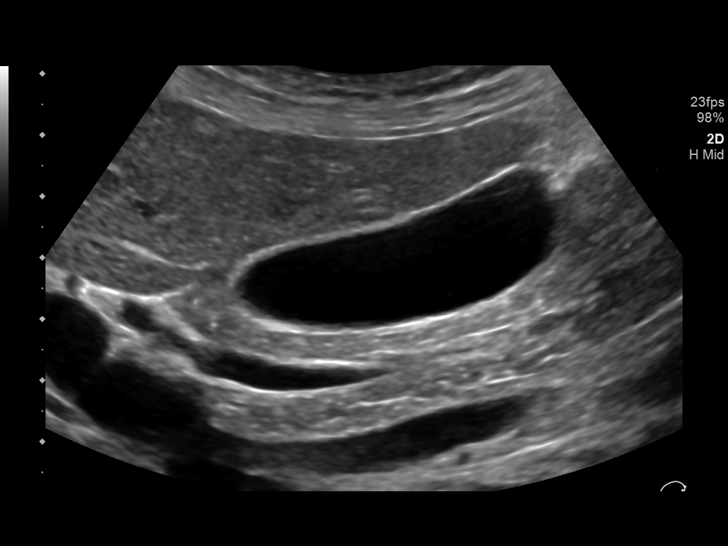
[im 17/51]
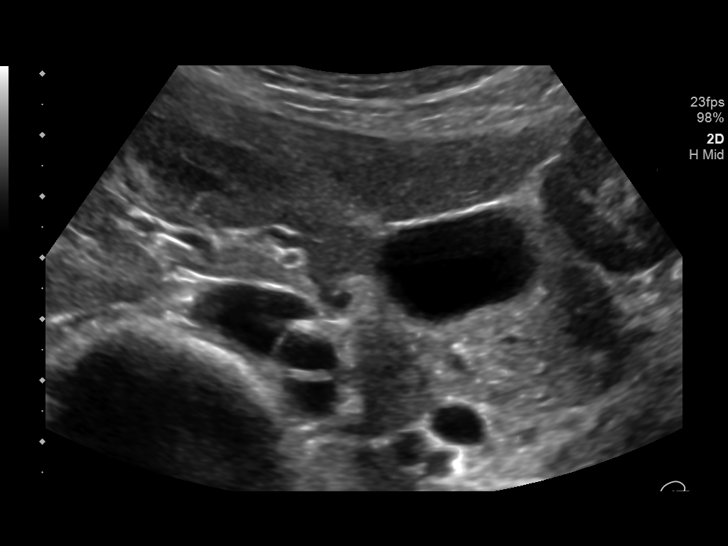
[im 19/51]
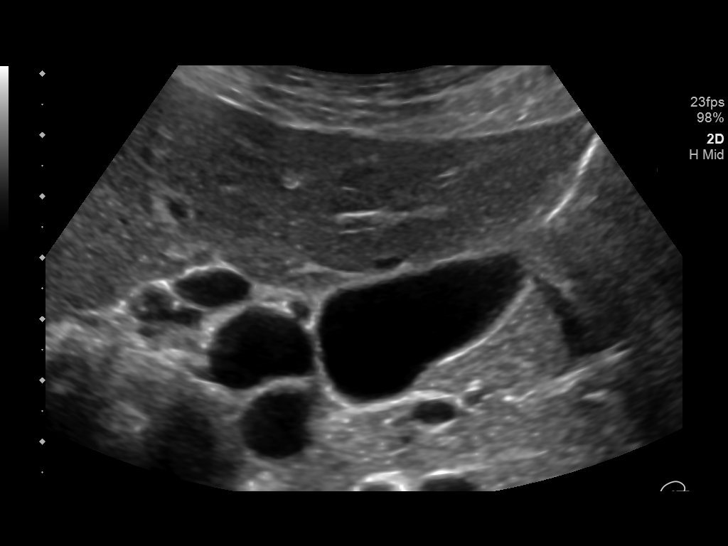
[im 23/51]
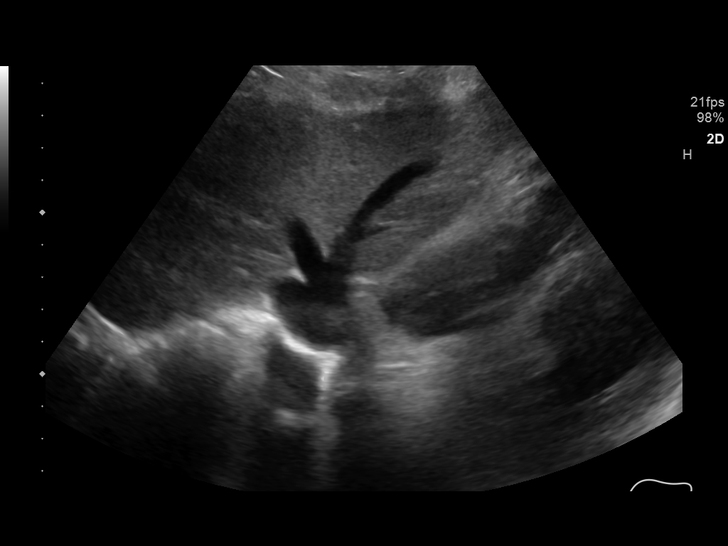
[im 28/51]
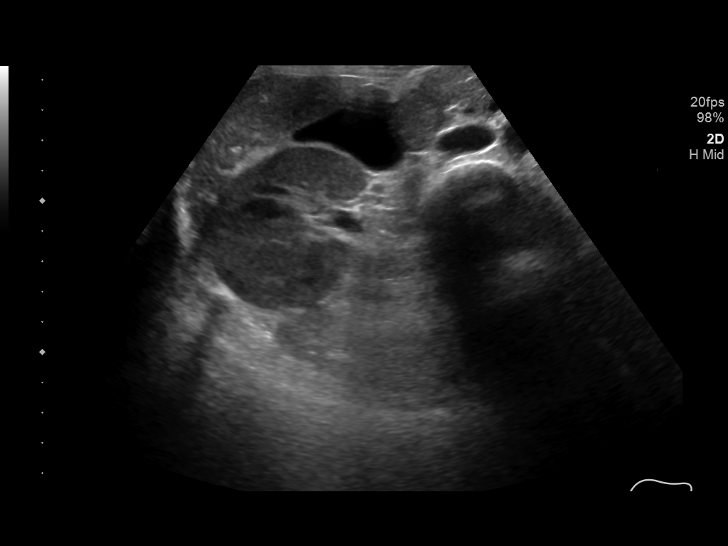
[im 32/51]
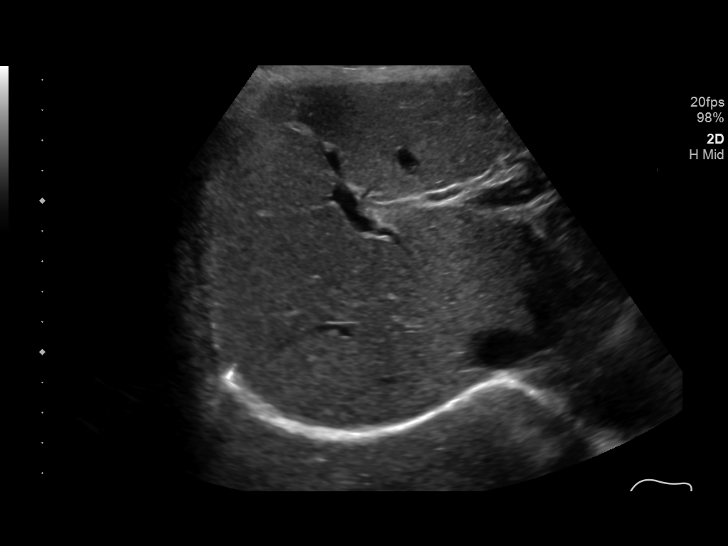
[im 34/51]
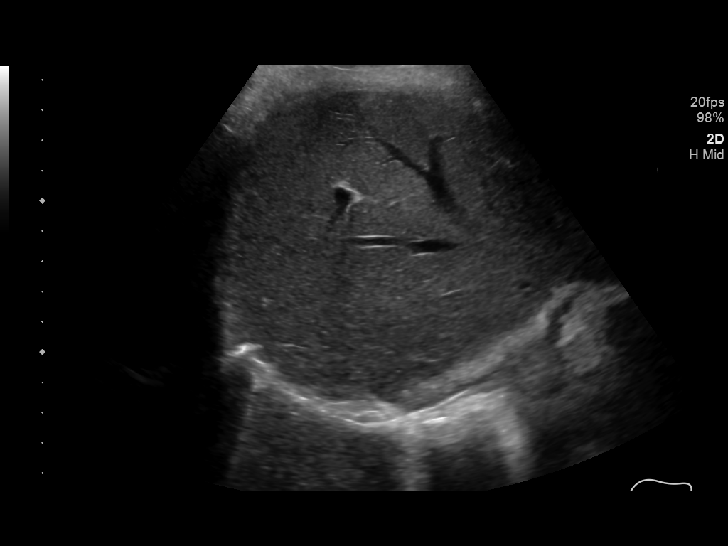
[im 38/51]
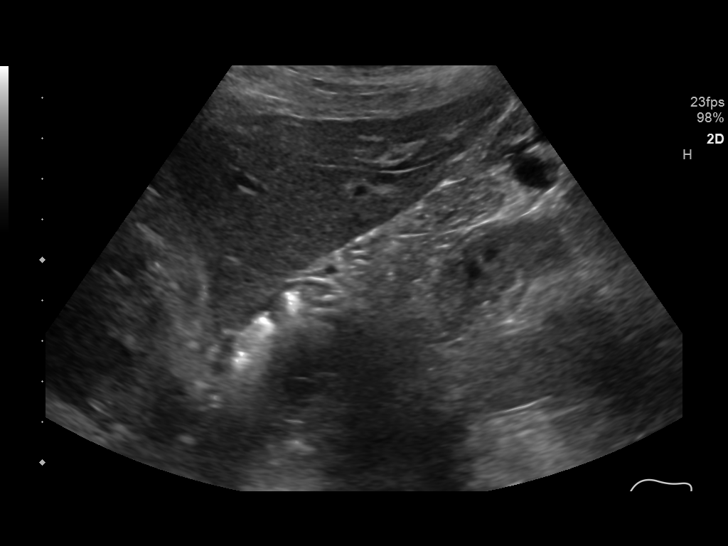
[im 42/51]
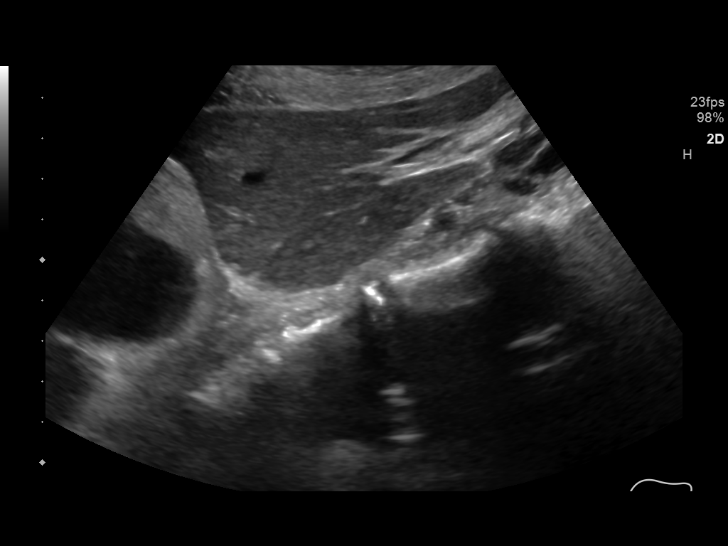
[im 46/51]
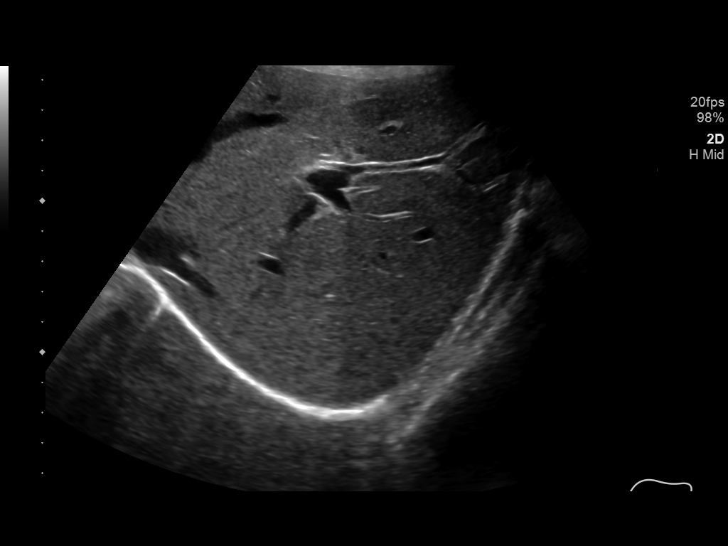
[im 51/51]
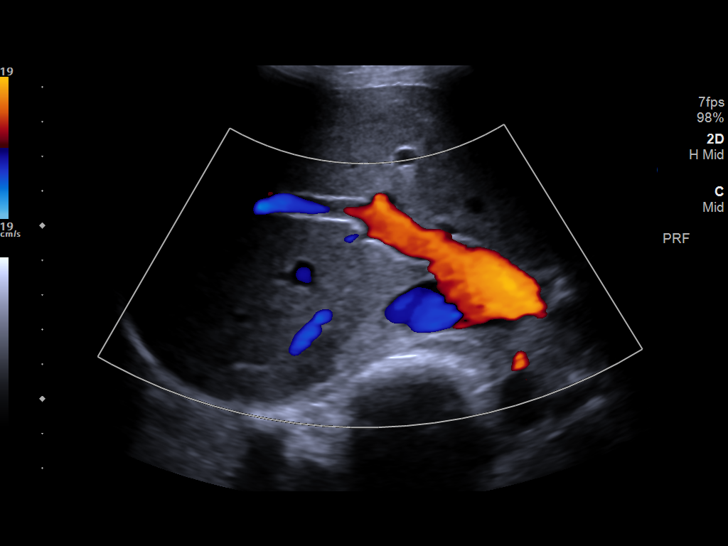

[14 of 25 positions shown; findings below may reference images not displayed]

FINDINGS: Gallbladder:

No gallstones or wall thickening visualized. No sonographic Murphy
sign noted by sonographer.

Common bile duct:

Diameter: 4 mm

Liver:

No focal lesion identified. Within normal limits in parenchymal
echogenicity. Portal vein is patent on color Doppler imaging with
normal direction of blood flow towards the liver.
IMPRESSION: Normal right upper quadrant ultrasound

## 2018-11-20 MED ORDER — DICYCLOMINE HCL 20 MG PO TABS: 20.0000 mg | ORAL_TABLET | Freq: Three times a day (TID) | ORAL | 0 refills | Status: DC

## 2018-11-20 NOTE — Discharge Summary (Signed)
Physician Discharge Summary  Allison Leonard KVQ:[id number] DOB: 1976/07/03 DOA: 11/19/2018  PCP: Bufford Lope, DO  Admit date: 11/19/2018 Discharge date: 11/20/2018  Admitted From: Home Disposition:  Home  Recommendations for Outpatient Follow-up:  1. Follow up with PCP in 1-2 weeks 2. Please obtain CMP/CBC in one week    Home Health: No Equipment/Devices:none  Discharge Condition: stable CODE STATUS: FULL Diet recommendation: Regular   Brief/Interim Summary:  #) Abdominal pain/recurrent nausea and vomiting: This completely resolved after approximately 24 hours of IV fluids and clear liquid diet which patient tolerated well.  Patient was given symptomatic management with IV PPI, antiemetics and pain control.  Electrolytes normalized on recheck.  White blood cell count normalized.  Patient is a long history of these episodes and has not been completely worked up by GI but has had negative colonoscopies and endoscopies.  Patient will follow-up as an outpatient with GI for possible cyclic vomiting syndrome.  She was also counseled to discontinue marijuana use as this may be contributing.  She was discharged home on PRN antiemetics and dicyclomine.  #) Migraines: Patient did have a migraine during this hospitalization.  She was continued on her as needed Excedrin Migraine.  Discharge Diagnoses:  Principal Problem:   Volume depletion Active Problems:   Acid reflux   Cyclical vomiting   Metabolic acidosis   Cannabis abuse   Nausea and vomiting    Discharge Instructions  Discharge Instructions    Ambulatory referral to Gastroenterology   Complete by:  As directed    Diet - low sodium heart healthy   Complete by:  As directed    Discharge instructions   Complete by:  As directed    Please follow-up with your primary care doctor in 1 week.  Please follow-up with the GI doctors.   Increase activity slowly   Complete by:  As directed      Allergies as of 11/20/2018      Reactions   Zofran [ondansetron Hcl] Anaphylaxis   Morphine And Related Itching      Medication List    TAKE these medications   aspirin-acetaminophen-caffeine 250-250-65 MG tablet Commonly known as:  EXCEDRIN MIGRAINE Take 1 tablet by mouth every 4 (four) hours as needed for headache.   dicyclomine 20 MG tablet Commonly known as:  BENTYL Take 1 tablet (20 mg total) by mouth 4 (four) times daily -  before meals and at bedtime.   famotidine 20 MG tablet Commonly known as:  PEPCID Take 1 tablet (20 mg total) by mouth 2 (two) times daily.   pantoprazole 40 MG tablet Commonly known as:  PROTONIX Take 40 mg by mouth daily.   promethazine 25 MG tablet Commonly known as:  PHENERGAN Take 1 tablet (25 mg total) by mouth every 6 (six) hours as needed for nausea or vomiting.      Follow-up Information    Schedule an appointment as soon as possible for a visit  with [facility].   Specialty:  Gastroenterology Why:  For re-check by a GI specialist Contact information: [address] [place] [phone] [place].   Why:  Follow up with your primary care doctor for recheck Contact information: [place] [facility].   Specialty:  Emergency Medicine Why:  As needed, if symptoms worsen Contact information: [place] [phone]  [place] [address] [phone]         Allergies  Allergen Reactions  . Zofran [Ondansetron Hcl] Anaphylaxis  . Morphine And Related Itching    Consultations:  None   Procedures/Studies: US Abdomen Limited Ruq  Result Date: 11/20/2018 CLINICAL DATA:  Nausea and vomiting EXAM: ULTRASOUND ABDOMEN LIMITED RIGHT UPPER QUADRANT COMPARISON:  None. FINDINGS: Gallbladder: No gallstones or wall thickening visualized. No sonographic Murphy sign  noted by sonographer. Common bile duct: Diameter: 4 mm Liver: No focal lesion identified. Within normal limits in parenchymal echogenicity. Portal vein is patent on color Doppler imaging with normal direction of blood flow towards the liver. IMPRESSION: Normal right upper quadrant ultrasound Electronically Signed   By: Monte Fantasia M.D.   On: 11/20/2018 06:34     Subjective:   Discharge Exam: Vitals:   11/19/18 2116 11/20/18 0540  BP: 134/87 139/82  Pulse: 85 83  Resp: 20 20  Temp: 98.5 F (36.9 C) 98.3 F (36.8 C)  SpO2: 100% 100%   Vitals:   11/19/18 1530 11/19/18 1733 11/19/18 2116 11/20/18 0540  BP: 132/75 (!) 114/92 134/87 139/82  Pulse:  83 85 83  Resp: 20 17 20 20   Temp:  98.3 F (36.8 C) 98.5 F (36.9 C) 98.3 F (36.8 C)  TempSrc:  Oral Oral Oral  SpO2:  100% 100% 100%  Weight:      Height:        General: Pt is alert, awake, not in acute distress Cardiovascular: RRR, S1/S2 +, no rubs, no gallops Respiratory: CTA bilaterally, no wheezing, no rhonchi Abdominal: Soft, NT, ND, bowel sounds + Extremities: no edema, no cyanosis    The results of significant diagnostics from this hospitalization (including imaging, microbiology, ancillary and laboratory) are listed below for reference.     Microbiology: No results found for this or any previous visit (from the past 240 hour(s)).   Labs: BNP (last 3 results) No results for input(s): BNP in the last 8760 hours. Basic Metabolic Panel: Recent Labs  Lab 11/19/18 0401 11/19/18 0630 11/19/18 1649 11/20/18 0341  NA 137 136 134* 138  K 4.2 4.4 3.6 3.8  CL 104 110 107 109  CO2 13* 13* 15* 19*  GLUCOSE 96 76 88 77  BUN 12 9 6  <5*  CREATININE 0.70 0.53 0.58 0.50  CALCIUM 9.6 7.9* 8.1* 8.2*   Liver Function Tests: Recent Labs  Lab 11/19/18 0401 11/20/18 0341  AST 27 18  ALT 16 12  ALKPHOS 70 46  BILITOT 1.2 1.1  PROT 8.8* 6.0*  ALBUMIN 5.1* 3.5   Recent Labs  Lab 11/19/18 0401  LIPASE 31   No  results for input(s): AMMONIA in the last 168 hours. CBC: Recent Labs  Lab 11/19/18 0401 11/20/18 0341  WBC 11.2* 7.4  HGB 14.3 10.7*  HCT 47.5* 34.5*  MCV 87.2 85.4  PLT 286 209   Cardiac Enzymes: No results for input(s): CKTOTAL, CKMB, CKMBINDEX, TROPONINI in the last 168 hours. BNP: Invalid input(s): POCBNP CBG: No results for input(s): GLUCAP in the last 168 hours. D-Dimer No results for input(s): DDIMER in the last 72 hours. Hgb A1c No results for input(s): HGBA1C in the last 72 hours. Lipid Profile No results for input(s): CHOL, HDL, LDLCALC, TRIG, CHOLHDL, LDLDIRECT in the last 72 hours. Thyroid function studies No results for input(s): TSH, T4TOTAL, T3FREE, THYROIDAB in the last 72 hours.  Invalid input(s): FREET3 Anemia work up No results for input(s): VITAMINB12, FOLATE, FERRITIN, TIBC, IRON, RETICCTPCT  in the last 72 hours. Urinalysis    Component Value Date/Time   COLORURINE YELLOW 11/19/2018 [place] 11/19/2018 0539   LABSPEC 1.019 11/19/2018 0539   PHURINE 5.0 11/19/2018 0539   GLUCOSEU NEGATIVE 11/19/2018 0539   HGBUR MODERATE (A) 11/19/2018 0539   BILIRUBINUR NEGATIVE 11/19/2018 0539   KETONESUR 80 (A) 11/19/2018 0539   PROTEINUR 30 (A) 11/19/2018 0539   NITRITE NEGATIVE 11/19/2018 0539   LEUKOCYTESUR NEGATIVE 11/19/2018 0539   Sepsis Labs Invalid input(s): PROCALCITONIN,  WBC,  LACTICIDVEN Microbiology No results found for this or any previous visit (from the past 240 hour(s)).   Time coordinating discharge: 35  SIGNED:   Cristy Folks, MD  [facility] 11/20/2018, 9:56 AM   If 7PM-7AM, please contact night-coverage [email or web address] Password [email or web address]

## 2018-11-20 NOTE — Progress Notes (Signed)
Reviewed discharge paperwork, and medication regimen, and needed follow up appointments with patient. Provided prescription. Patient declined wheelchair escort.

## 2018-11-21 ENCOUNTER — Other Ambulatory Visit: Payer: Self-pay

## 2018-11-21 MED ORDER — PROMETHAZINE HCL 25 MG PO TABS: 25.0000 mg | ORAL_TABLET | Freq: Four times a day (QID) | ORAL | 0 refills | Status: DC | PRN

## 2018-11-25 ENCOUNTER — Encounter: Payer: Self-pay | Admitting: Gastroenterology

## 2018-12-01 ENCOUNTER — Telehealth: Payer: Self-pay | Admitting: Gastroenterology

## 2018-12-01 NOTE — Telephone Encounter (Signed)
Rec'd from Henry Russel forwarded 16 pages to Dr. Thornton Park

## 2018-12-03 NOTE — Telephone Encounter (Signed)
Records reviewed by Dr. Tarri Glenn ok to schedule.

## 2018-12-15 ENCOUNTER — Ambulatory Visit: Payer: Medicaid Other | Admitting: Family Medicine

## 2018-12-18 ENCOUNTER — Encounter: Payer: Self-pay | Admitting: Gastroenterology

## 2018-12-18 ENCOUNTER — Other Ambulatory Visit (INDEPENDENT_AMBULATORY_CARE_PROVIDER_SITE_OTHER): Payer: Medicaid Other

## 2018-12-18 ENCOUNTER — Ambulatory Visit (INDEPENDENT_AMBULATORY_CARE_PROVIDER_SITE_OTHER): Payer: Medicaid Other | Admitting: Gastroenterology

## 2018-12-18 ENCOUNTER — Telehealth: Payer: Self-pay | Admitting: Gastroenterology

## 2018-12-18 VITALS — BP 128/94 | HR 90 | Ht 62.0 in | Wt 96.2 lb

## 2018-12-18 DIAGNOSIS — K219 Gastro-esophageal reflux disease without esophagitis: Secondary | ICD-10-CM

## 2018-12-18 DIAGNOSIS — R9389 Abnormal findings on diagnostic imaging of other specified body structures: Secondary | ICD-10-CM | POA: Diagnosis not present

## 2018-12-18 DIAGNOSIS — R112 Nausea with vomiting, unspecified: Secondary | ICD-10-CM | POA: Diagnosis not present

## 2018-12-18 DIAGNOSIS — D649 Anemia, unspecified: Secondary | ICD-10-CM

## 2018-12-18 DIAGNOSIS — Z86010 Personal history of colonic polyps: Secondary | ICD-10-CM

## 2018-12-18 LAB — IGA: IgA: 150 mg/dL (ref 68–378)

## 2018-12-18 MED ORDER — PROMETHAZINE HCL 12.5 MG PO TABS: 12.5000 mg | ORAL_TABLET | Freq: Four times a day (QID) | ORAL | 0 refills | Status: DC | PRN

## 2018-12-18 MED ORDER — DICYCLOMINE HCL 20 MG PO TABS: 20.0000 mg | ORAL_TABLET | Freq: Three times a day (TID) | ORAL | 2 refills | Status: DC

## 2018-12-18 MED ORDER — FAMOTIDINE 20 MG PO TABS: 20.0000 mg | ORAL_TABLET | Freq: Two times a day (BID) | ORAL | 5 refills | Status: DC

## 2018-12-18 NOTE — Patient Instructions (Addendum)
We have sent the following medications to your pharmacy for you to pick up at your convenience: Bentyl 20 mg four times a day as needed.  Phenergan 12.5 mg twice a day Pepcid 20 mg twice a day  You will be due for a recall colonoscopy in 2021. We will send you a reminder in the mail when it gets closer to that time.  We will refer you to gynecology.   Continue to abstain from marijuana. You are doing great!!  Your provider has requested that you go to the basement level for lab work before leaving today. Press "B" on the elevator. The lab is located at the first door on the left as you exit the elevator.

## 2018-12-18 NOTE — Addendum Note (Signed)
Addended by: Wyline Beady on: 12/18/2018 02:25 PM   Modules accepted: Orders

## 2018-12-18 NOTE — Progress Notes (Signed)
Referring Provider: Cristy Folks, MD Primary Care Physician:  Bufford Lope, DO   Reason for Consultation: Cyclic vomiting   IMPRESSION:  Intermittent nausea, vomiting, and abdominal pain    - symptoms initially developed in 2007 following hysterectomy    - Recurrent symptoms require ED visits at least every 6 months    - Previously evaluated with multiple EGDs, colonoscopies, ultrasounds, and CT scan    -Liver enzymes and lipase levels have been consistently normal    - Intermittent relief with Phenergan, Bentyl History of colon polyps    -5 tubular adenomatous on a colonoscopy in 2015 with Dr. Redmond Pulling in [place]    -Hyperplastic polyp on colonoscopy 2018    -Surveillance colonoscopy recommended 2021 Normocytic anemia with a hemoglobin of 10.7 Abnormal CT August 2014    - 06/2013: 3.7 cm x 1.6 cm structure located near the rectum in the inferior pelvis/perineum.    - 06/2016: cystic lesions were seen in the vaginal wall thought to represent [facility] gland cysts.  The largest measured 4 x 2.1 x 1.9 cm.  Chronic constipation previously on Linzess for 1 year ? Diagnosis of celiac per patient report    - duodenal biopsies were negative in 2015 (not on gluten free diet at that time) Migraines  Recurrent GI symptoms is unclear.  May be hyperemesis associated with cannabis.  I have congratulated on her 1 month of abstinence and have encouraged her continue to abstain from all marijuana use.  Cyclic vomiting syndrome, abdominal migraines, functional etiologies, and food allergies/insensitivities are possible.  Will resume medications that have previously controlled her symptoms including Bentyl and Phenergan.  We will add Pepcid in the event that her nausea is related to reflux.  Labs today to evaluate for celiac given the patient reported history of the diagnosis.  She may benefit from a trial of amitriptyline if her symptoms persist.  Surveillance colonoscopy recommended in 2021  given her history of tubular adenomas.  I reviewed her abnormal pelvic CT findings from exams in 2014 and 2017.  She has not seen a gynecologist since the CT scans were obtained.  Will refer to gynecology at this time although I do not believe these findings explain her recurrent GI symptoms.  PLAN: - Trial of Bentyl 20 mg QID prn abdominal pain - Phenergan 12.5 mg BID prn nausea - Pepcid 20 mg BID to control her nausea - TTGA and IgA - Avoid chocolate, cheese, monosodium glutamate, cow's milk, soy, and egg white protein which may trigger symptoms - Continue to abstain from all marijuana - Trial of amitryptlene in the future if symptoms persist - Surveillance colonoscopy 2021 - Refer to gynecology to review the abnormal CT findings - Return to this clinic 3-4 months   HPI: Allison Leonard is a 43 y.o. female seen in consultation at the request of Dr. Dennis Bast for further evaluation of cyclic vomiting.  The history is obtained through the patient and review of her electronic health record. She recently moved to [place] from [place].   I have also reviewed prior records from [facility].  She has a history of migraines. She has had an appendectomy and hysterectomy for bleeding.  Her 32 year old son with Down's syndrome lives with her.  She was previously told that she had celiac disease.   She reports years of intermittent abdominal pain and nausea.  She has frequent visits to multiple emergency rooms with visits at least once every 6 months.  She has been previously seen by a gastroenterologist in [place].  Symptoms first started in 2007, one year after the hysterectomy. The pain is primarily in the epigastrium. Non radiating. Intermittent pain. Feels like a labor contraction.  She has to deep breathe to control the pain. She is associated heartburn.  This can cause significant nausea.  The nausea tends to occur in the morning when she wakes. Occasional results in vomiting  of yellow bile. Without phenergan, she will have nausea throughout the day. She ran out of Phenergan several months ago. Previously used a dramamine in the morning with relief, but, this is no longer helping.  The abdominal pain typically occurs after meals.  No other clear aggravating or alleviating factors except for alcohol and sauces. No change with position or defecation.    Chronic constipation. Has a bowel movement once a week. Trial of Linzess did not provide any relief. Cant' remember when she took it or the dose. Thinks she took it for almost one year.   She was hospitalized at [facility] in January 2020 for abdominal pain and recurrent nausea and vomiting.  The pain resolved after 24 hours of IV fluids and a clear liquid diet.  There was some thought that marijuana may be contributing to her symptoms.  She reports 7-8 pounds weight loss over the last 6 months. Good appetite. Good energy.   She smoked 7-8 Newports every day. Quit marijuana with her recent hospitalization. Previously smoking marijauna daily. Last used at the time of her hospitalized. She notes some improvement but not full resolution since stopping marijuana. Does not a decrease in headache frequency. Previously used dicyclomine with some improvement of her abdominal pain, but she is currently.   Abdominal ultrasound 11/20/2018 for nausea and vomiting was normal. Labs from 11/20/2017 show a normal comprehensive metabolic panel including normal liver enzymes with an AST of 18, ALT 12, alk phos 46, total bilirubin 1.1.  Hemoglobin was 10.7, MCV 85, RDW 14.1.  Reviewed Procedure notes from Dr. Redmond Pulling at [facility] from 04/16/17 She had an endoscopic evaluation in 2015 for abdominal pain and weight loss.  Colonoscopy 2015: 5 tubular adenomas, biopsies negative for microscopic colitis EGD 2015: normal duodenal; mild chronic gastritis negative for H pylori. Normal duodenal biopsies. Colonoscopy 2018: hyperplastic  polyp Surveillance colonoscopy recommended 2021 CT in August 2014 showed a 3.7 cm x 1.6 cm structure located near the rectum in the inferior pelvis/perineum.  Records in care everywhere show a CT of the abdomen and pelvis 06/19/2017 showing no acute abnormality.  The uterus was absent.  Cystic lesions were seen in the vaginal wall thought to represent [facility] gland cysts.  The largest measured 4 x 2.1 x 1.9 cm.   An abdominal ultrasound 06/19/2017 showed no acute abnormality.  Common bile duct measured 2.5 mm.     Past Medical History:  Diagnosis Date  . Acid reflux 07/04/2018  . Anemia   . Celiac disease/sprue   . Cyclic vomiting syndrome   . Generalized anxiety disorder 07/04/2018  . History of colon polyps    last colonoscopy 2018 removed multiple benign polyps  . Migraines 07/04/2018    Past Surgical History:  Procedure Laterality Date  . ABDOMINAL HYSTERECTOMY  2006  . APPENDECTOMY  1999  . COLONOSCOPY  2018  . ESOPHAGOGASTRODUODENOSCOPY  2018    Current Outpatient Medications  Medication Sig Dispense Refill  . aspirin-acetaminophen-caffeine (EXCEDRIN MIGRAINE) 250-250-65 MG tablet Take 1 tablet by mouth every 4 (four) hours as needed  for headache.    . famotidine (PEPCID) 20 MG tablet Take 1 tablet (20 mg total) by mouth 2 (two) times daily. 60 tablet 5  . pantoprazole (PROTONIX) 40 MG tablet Take 40 mg by mouth daily.    Marland Kitchen dicyclomine (BENTYL) 20 MG tablet Take 1 tablet (20 mg total) by mouth 4 (four) times daily -  before meals and at bedtime. (Patient not taking: Reported on 12/18/2018) 30 tablet 0  . promethazine (PHENERGAN) 25 MG tablet Take 1 tablet (25 mg total) by mouth every 6 (six) hours as needed for nausea or vomiting. (Patient not taking: Reported on 12/18/2018) 12 tablet 0   No current facility-administered medications for this visit.     Allergies as of 12/18/2018 - Review Complete 12/18/2018  Allergen Reaction Noted  . Zofran [ondansetron hcl]  Anaphylaxis 10/17/2018  . Morphine and related Itching 06/23/2018    Family History  Problem Relation Age of Onset  . Diabetes Father   . Hypertension Father   . Hyperlipidemia Father   . Colon polyps Father   . Heart attack Maternal Grandmother   . Heart attack Paternal Grandmother   . Cervical cancer Maternal Aunt   . Throat cancer Maternal Uncle   . Stroke Maternal Uncle   . Down syndrome Son   . Colon cancer Neg Hx     Social History   Socioeconomic History  . Marital status: Single    Spouse name: Not on file  . Number of children: 3  . Years of education: Not on file  . Highest education level: Not on file  Occupational History  . Not on file  Social Needs  . Financial resource strain: Not on file  . Food insecurity:    Worry: Not on file    Inability: Not on file  . Transportation needs:    Medical: Not on file    Non-medical: Not on file  Tobacco Use  . Smoking status: Current Every Day Smoker    Packs/day: 0.50    Years: 7.00    Pack years: 3.50    Types: Cigarettes  . Smokeless tobacco: Never Used  Substance and Sexual Activity  . Alcohol use: Not Currently  . Drug use: Not Currently    Types: Marijuana    Comment: quit a month ago per patient  . Sexual activity: Not on file  Lifestyle  . Physical activity:    Days per week: Not on file    Minutes per session: Not on file  . Stress: Not on file  Relationships  . Social connections:    Talks on phone: Not on file    Gets together: Not on file    Attends religious service: Not on file    Active member of club or organization: Not on file    Attends meetings of clubs or organizations: Not on file    Relationship status: Not on file  . Intimate partner violence:    Fear of current or ex partner: Not on file    Emotionally abused: Not on file    Physically abused: Not on file    Forced sexual activity: Not on file  Other Topics Concern  . Not on file  Social History Narrative  . Not on file     Review of Systems: 12 system ROS is negative except as noted above except for vision changes and headaches.  Filed Weights   12/18/18 0856  Weight: 96 lb 4 oz (43.7 kg)    Physical  Exam: Vital signs were reviewed. General:   Alert, well-nourished, pleasant and cooperative in NAD Head:  Normocephalic and atraumatic. Eyes:  Sclera clear, no icterus.   Conjunctiva pink. Mouth:  No deformity or lesions.   Neck:  Supple; no thyromegaly. Lungs:  Clear throughout to auscultation.   No wheezes.  Heart:  Regular rate and rhythm; no murmurs Abdomen:  Soft, scaphoid, nontender, normal bowel sounds. No rebound or guarding. No hepatosplenomegaly Rectal:  Deferred  Msk:  Symmetrical without gross deformities. Extremities:  No gross deformities or edema. Neurologic:  Alert and  oriented x4;  grossly nonfocal Skin:  No rash or bruise. Multiple tattoos.  Psych:  Alert and cooperative. Normal mood and affect.   Loris Winrow L. Tarri Glenn, MD, MPH [facility] 12/18/2018, 9:19 AM

## 2018-12-18 NOTE — Telephone Encounter (Signed)
Rx sent to pharmacy   

## 2018-12-19 ENCOUNTER — Other Ambulatory Visit: Payer: Self-pay | Admitting: Emergency Medicine

## 2018-12-19 LAB — TISSUE TRANSGLUTAMINASE, IGA: (tTG) Ab, IgA: 1 U/mL

## 2018-12-19 NOTE — Progress Notes (Signed)
Referral sent to [facility].

## 2019-02-20 ENCOUNTER — Encounter (HOSPITAL_COMMUNITY): Payer: Self-pay | Admitting: Emergency Medicine

## 2019-02-20 ENCOUNTER — Emergency Department (HOSPITAL_COMMUNITY)
Admission: EM | Admit: 2019-02-20 | Discharge: 2019-02-20 | Disposition: A | Discharge status: Home / Self Care | Payer: Medicaid Other | Attending: Emergency Medicine | Admitting: Emergency Medicine

## 2019-02-20 DIAGNOSIS — E860 Dehydration: Secondary | ICD-10-CM | POA: Insufficient documentation

## 2019-02-20 DIAGNOSIS — R1115 Cyclical vomiting syndrome unrelated to migraine: Secondary | ICD-10-CM

## 2019-02-20 DIAGNOSIS — G43A0 Cyclical vomiting, not intractable: Secondary | ICD-10-CM | POA: Diagnosis not present

## 2019-02-20 DIAGNOSIS — Z79899 Other long term (current) drug therapy: Secondary | ICD-10-CM | POA: Insufficient documentation

## 2019-02-20 DIAGNOSIS — R1084 Generalized abdominal pain: Secondary | ICD-10-CM | POA: Diagnosis not present

## 2019-02-20 DIAGNOSIS — R531 Weakness: Secondary | ICD-10-CM | POA: Diagnosis not present

## 2019-02-20 DIAGNOSIS — F17210 Nicotine dependence, cigarettes, uncomplicated: Secondary | ICD-10-CM | POA: Diagnosis not present

## 2019-02-20 DIAGNOSIS — R112 Nausea with vomiting, unspecified: Secondary | ICD-10-CM | POA: Diagnosis present

## 2019-02-20 LAB — CBC
HCT: 51.2 % — ABNORMAL HIGH (ref 36.0–46.0)
Hemoglobin: 15.3 g/dL — ABNORMAL HIGH (ref 12.0–15.0)
MCH: 26.3 pg (ref 26.0–34.0)
MCHC: 29.9 g/dL — ABNORMAL LOW (ref 30.0–36.0)
MCV: 88.1 fL (ref 80.0–100.0)
Platelets: 272 10*3/uL (ref 150–400)
RBC: 5.81 MIL/uL — ABNORMAL HIGH (ref 3.87–5.11)
RDW: 13.7 % (ref 11.5–15.5)
WBC: 10.3 10*3/uL (ref 4.0–10.5)
nRBC: 0 % (ref 0.0–0.2)

## 2019-02-20 LAB — COMPREHENSIVE METABOLIC PANEL
ALT: 15 U/L (ref 0–44)
AST: 23 U/L (ref 15–41)
Albumin: 5.7 g/dL — ABNORMAL HIGH (ref 3.5–5.0)
Alkaline Phosphatase: 81 U/L (ref 38–126)
Anion gap: 17 — ABNORMAL HIGH (ref 5–15)
BUN: 16 mg/dL (ref 6–20)
CO2: 15 mmol/L — ABNORMAL LOW (ref 22–32)
Calcium: 9.8 mg/dL (ref 8.9–10.3)
Chloride: 104 mmol/L (ref 98–111)
Creatinine, Ser: 0.69 mg/dL (ref 0.44–1.00)
GFR calc Af Amer: >60 mL/min (ref >60)
GFR calc non Af Amer: >60 mL/min (ref >60)
Glucose, Bld: 86 mg/dL (ref 70–99)
Potassium: 4.3 mmol/L (ref 3.5–5.1)
Sodium: 136 mmol/L (ref 135–145)
Total Bilirubin: 0.9 mg/dL (ref 0.3–1.2)
Total Protein: 9.9 g/dL — ABNORMAL HIGH (ref 6.5–8.1)

## 2019-02-20 LAB — URINALYSIS, ROUTINE W REFLEX MICROSCOPIC
Bacteria, UA: NONE SEEN
Bilirubin Urine: NEGATIVE
Glucose, UA: NEGATIVE mg/dL
Ketones, ur: 80 mg/dL — AB
Leukocytes,Ua: NEGATIVE
Nitrite: NEGATIVE
Protein, ur: 100 mg/dL — AB
Specific Gravity, Urine: 1.02 (ref 1.005–1.030)
pH: 5 (ref 5.0–8.0)

## 2019-02-20 LAB — LIPASE, BLOOD: Lipase: 25 U/L (ref 11–51)

## 2019-02-20 LAB — I-STAT BETA HCG BLOOD, ED (MC, WL, AP ONLY): I-stat hCG, quantitative: <5 m[IU]/mL (ref <5)

## 2019-02-20 MED ORDER — SODIUM CHLORIDE 0.9% FLUSH
3.0000 mL | Freq: Once | INTRAVENOUS | Status: AC
  Administered 2019-02-20: 3 mL via INTRAVENOUS

## 2019-02-20 MED ORDER — SODIUM CHLORIDE 0.9 % IV BOLUS
1000.0000 mL | Freq: Once | INTRAVENOUS | Status: AC
  Administered 2019-02-20: 1000 mL via INTRAVENOUS

## 2019-02-20 MED ORDER — PROMETHAZINE HCL 25 MG RE SUPP: 25.0000 mg | Freq: Four times a day (QID) | RECTAL | 0 refills | Status: DC | PRN

## 2019-02-20 MED ORDER — SODIUM CHLORIDE 0.9 % IV BOLUS
500.0000 mL | Freq: Once | INTRAVENOUS | Status: AC
  Administered 2019-02-20: 500 mL via INTRAVENOUS

## 2019-02-20 MED ORDER — PROCHLORPERAZINE EDISYLATE 10 MG/2ML IJ SOLN
10.0000 mg | Freq: Once | INTRAMUSCULAR | Status: AC
  Administered 2019-02-20: 10 mg via INTRAVENOUS
  Filled 2019-02-20: qty 2

## 2019-02-20 MED ORDER — PROCHLORPERAZINE MALEATE 10 MG PO TABS: 10.0000 mg | ORAL_TABLET | Freq: Three times a day (TID) | ORAL | 0 refills | Status: DC | PRN

## 2019-02-20 MED ORDER — KETOROLAC TROMETHAMINE 15 MG/ML IJ SOLN
15.0000 mg | Freq: Once | INTRAMUSCULAR | Status: AC
  Administered 2019-02-20: 15 mg via INTRAVENOUS
  Filled 2019-02-20: qty 1

## 2019-02-20 MED ORDER — DIPHENHYDRAMINE HCL 50 MG/ML IJ SOLN
25.0000 mg | Freq: Once | INTRAMUSCULAR | Status: AC
  Administered 2019-02-20: 25 mg via INTRAVENOUS
  Filled 2019-02-20: qty 1

## 2019-02-20 MED ORDER — ALUM & MAG HYDROXIDE-SIMETH 200-200-20 MG/5ML PO SUSP
30.0000 mL | Freq: Once | ORAL | Status: AC
  Administered 2019-02-20: 30 mL via ORAL
  Filled 2019-02-20: qty 30

## 2019-02-20 MED ORDER — LIDOCAINE VISCOUS HCL 2 % MT SOLN
15.0000 mL | Freq: Once | OROMUCOSAL | Status: AC
  Administered 2019-02-20: 16:00:00 15 mL via ORAL
  Filled 2019-02-20: qty 15

## 2019-02-20 NOTE — ED Provider Notes (Signed)
Care transferred to me.  The patient is feeling much better and was tolerating oral fluids.  When her 500 cc IV fluid bolus finishes up, she will be discharged home.  Prescriptions previously written by Dr. Ellender Hose.   Sherwood Gambler, MD 02/20/19 [phone]

## 2019-02-20 NOTE — ED Notes (Signed)
Pt made aware that urine collection is needed

## 2019-02-20 NOTE — ED Triage Notes (Signed)
Patient here from home with complaints abdominal pain, n/v x3 off and on x3 months. Hx of same. Pain right upper quadrant.

## 2019-02-20 NOTE — ED Notes (Signed)
Patient was able to take sips of water w/o any difficulties or complaints.

## 2019-02-20 NOTE — ED Provider Notes (Signed)
[place] [facility] DEPT Provider Note   CSN: [id number] Arrival date & time: 02/20/19  1337    History   Chief Complaint Chief Complaint  Patient presents with  . Abdominal Pain  . Nausea  . Emesis    HPI Allison Leonard is a 43 y.o. female.     HPI 43 year old female with history of cyclical vomiting, chronic gastric reflux, here with abdominal pain, nausea, and vomiting.  The patient states that she has been under significantly increased stress recently due to the recent diagnosis of her son, who has Down syndrome, with diabetes.  She states that over the last 24 hours, she developed progressively worsening crampy abdominal pain followed by nausea and vomiting.  She states she been unable to tolerate any liquid or solid intake over the last 24 hours.  She said associated nausea, and nonbloody, nonbilious emesis.  Her pain is diffuse and cramp-like, and not persistent.  No fevers or chills.  She said decreased urinary output due to this.  Denies any diarrhea.  No cough.  No known sick contacts.  No recent medication changes.  No suspicious food intake.  Past Medical History:  Diagnosis Date  . Acid reflux 07/04/2018  . Anemia   . Celiac disease/sprue   . Cyclic vomiting syndrome   . Generalized anxiety disorder 07/04/2018  . History of colon polyps    last colonoscopy 2018 removed multiple benign polyps  . Migraines 07/04/2018    Patient Active Problem List   Diagnosis Date Noted  . Metabolic acidosis 84/69/6295  . Volume depletion 11/19/2018  . Cannabis abuse 11/19/2018  . Nausea and vomiting 11/19/2018  . Cyclical vomiting 28/41/3244  . Stress 07/04/2018  . Acid reflux 07/04/2018  . Migraines 07/04/2018    Past Surgical History:  Procedure Laterality Date  . ABDOMINAL HYSTERECTOMY  2006  . APPENDECTOMY  1999  . COLONOSCOPY  2018  . ESOPHAGOGASTRODUODENOSCOPY  2018     OB History   No obstetric history on file.      Home  Medications    Prior to Admission medications   Medication Sig Start Date End Date Taking? Authorizing Provider  aspirin-acetaminophen-caffeine (EXCEDRIN MIGRAINE) [phone] MG tablet Take 1 tablet by mouth every 4 (four) hours as needed for headache.   Yes [provider]  dicyclomine (BENTYL) 20 MG tablet Take 1 tablet (20 mg total) by mouth 4 (four) times daily -  before meals and at bedtime. 12/18/18  Yes Thornton Park, MD  famotidine (PEPCID) 20 MG tablet Take 1 tablet (20 mg total) by mouth 2 (two) times daily. 12/18/18  Yes Thornton Park, MD  pantoprazole (PROTONIX) 40 MG tablet Take 40 mg by mouth daily.   Yes [provider]  prochlorperazine (COMPAZINE) 10 MG tablet Take 1 tablet (10 mg total) by mouth every 8 (eight) hours as needed for nausea or vomiting. 02/20/19   Duffy Bruce, MD  promethazine (PHENERGAN) 25 MG suppository Place 1 suppository (25 mg total) rectally every 6 (six) hours as needed for refractory nausea / vomiting. 02/20/19   Duffy Bruce, MD    Family History Family History  Problem Relation Age of Onset  . Diabetes Father   . Hypertension Father   . Hyperlipidemia Father   . Colon polyps Father   . Heart attack Maternal Grandmother   . Heart attack Paternal Grandmother   . Cervical cancer Maternal Aunt   . Throat cancer Maternal Uncle   . Stroke Maternal Uncle   .  Down syndrome Son   . Colon cancer Neg Hx     Social History Social History   Tobacco Use  . Smoking status: Current Every Day Smoker    Packs/day: 0.50    Years: 7.00    Pack years: 3.50    Types: Cigarettes  . Smokeless tobacco: Never Used  Substance Use Topics  . Alcohol use: Not Currently  . Drug use: Not Currently    Types: Marijuana    Comment: quit a month ago per patient     Allergies   Zofran [ondansetron hcl] and Morphine and related   Review of Systems Review of Systems  Constitutional: Positive for fatigue. Negative for chills and  fever.  HENT: Negative for congestion and rhinorrhea.   Eyes: Negative for visual disturbance.  Respiratory: Negative for cough, shortness of breath and wheezing.   Cardiovascular: Negative for chest pain and leg swelling.  Gastrointestinal: Positive for abdominal pain, nausea and vomiting. Negative for diarrhea.  Genitourinary: Negative for dysuria and flank pain.  Musculoskeletal: Negative for neck pain and neck stiffness.  Skin: Negative for rash and wound.  Allergic/Immunologic: Negative for immunocompromised state.  Neurological: Positive for weakness. Negative for syncope and headaches.  All other systems reviewed and are negative.    Physical Exam Updated Vital Signs BP 133/79 (BP Location: Right Arm)   Pulse 100   Temp 98 F (36.7 C) (Oral)   Resp 15   SpO2 100%   Physical Exam Vitals signs and nursing note reviewed.  Constitutional:      General: She is not in acute distress.    Appearance: She is well-developed.  HENT:     Head: Normocephalic and atraumatic.  Eyes:     Conjunctiva/sclera: Conjunctivae normal.  Neck:     Musculoskeletal: Neck supple.  Cardiovascular:     Rate and Rhythm: Normal rate and regular rhythm.     Heart sounds: Normal heart sounds. No murmur. No friction rub.  Pulmonary:     Effort: Pulmonary effort is normal. No respiratory distress.     Breath sounds: Normal breath sounds. No wheezing or rales.  Abdominal:     General: There is no distension.     Palpations: Abdomen is soft.     Tenderness: There is generalized abdominal tenderness (Mild, without rebound or guarding).  Skin:    General: Skin is warm.     Capillary Refill: Capillary refill takes less than 2 seconds.  Neurological:     Mental Status: She is alert and oriented to person, place, and time.     Motor: No abnormal muscle tone.      ED Treatments / Results  Labs (all labs ordered are listed, but only abnormal results are displayed) Labs Reviewed  COMPREHENSIVE  METABOLIC PANEL - Abnormal; Notable for the following components:      Result Value   CO2 15 (*)    Total Protein 9.9 (*)    Albumin 5.7 (*)    Anion gap 17 (*)    All other components within normal limits  CBC - Abnormal; Notable for the following components:   RBC 5.81 (*)    Hemoglobin 15.3 (*)    HCT 51.2 (*)    MCHC 29.9 (*)    All other components within normal limits  URINALYSIS, ROUTINE W REFLEX MICROSCOPIC - Abnormal; Notable for the following components:   Hgb urine dipstick MODERATE (*)    Ketones, ur 80 (*)    Protein, ur 100 (*)  All other components within normal limits  LIPASE, BLOOD  I-STAT BETA HCG BLOOD, ED ([facility], [facility], [facility] ONLY)    EKG None  Radiology No results found.  Procedures Procedures (including critical care time)  Medications Ordered in ED Medications  sodium chloride flush (NS) 0.9 % injection 3 mL (3 mLs Intravenous Given 02/20/19 1503)  prochlorperazine (COMPAZINE) injection 10 mg (10 mg Intravenous Given 02/20/19 1504)  diphenhydrAMINE (BENADRYL) injection 25 mg (25 mg Intravenous Given 02/20/19 1506)  ketorolac (TORADOL) 15 MG/ML injection 15 mg (15 mg Intravenous Given 02/20/19 1507)  sodium chloride 0.9 % bolus 1,000 mL (0 mLs Intravenous Stopped 02/20/19 1557)  sodium chloride 0.9 % bolus 1,000 mL (0 mLs Intravenous Stopped 02/20/19 1727)  alum & mag hydroxide-simeth (MAALOX/MYLANTA) 200-200-20 MG/5ML suspension 30 mL (30 mLs Oral Given 02/20/19 1557)    And  lidocaine (XYLOCAINE) 2 % viscous mouth solution 15 mL (15 mLs Oral Given 02/20/19 1558)  sodium chloride 0.9 % bolus 500 mL (0 mLs Intravenous Stopped 02/20/19 1812)     Initial Impression / Assessment and Plan / ED Course  I have reviewed the triage vital signs and the nursing notes.  Pertinent labs & imaging results that were available during my care of the patient were reviewed by me and considered in my medical decision making (see chart for details).  Clinical Course as of Feb 21 712  Fri Apr 17, 515  358 43 year old female here with what I suspect is likely cyclical vomiting syndrome, possibly cannabis hyperemesis.  This is a known, recurrent issue.  She has no overt right upper quadrant tenderness on my exam to suggest cholecystitis will follow-up screening labs, treat symptomatically, and reassess.  She admits to multiple recent stressors which could have exacerbated her symptoms.   [CI]  9450 Patient feeling much better.  She is willing to try p.o.  Receiving second liter of fluid and will follow-up urinalysis.  If she remains improved and can tolerate p.o., can likely discharge home.  Lab work reviewed.  She appears mildly hemoconcentrated which is not surprising.  Bicarb is 15.  This appears to be a recurrent issue for her with her cyclical vomiting, and is likely related to dehydration.  Renal function is normal.  Fluids given.   [CI]    Clinical Course User Index [CI] Duffy Bruce, MD      Pt feeling much better. Tolerating PO. HR improved. Plan to PO challenge, d/c if able to tolerate.  Final Clinical Impressions(s) / ED Diagnoses   Final diagnoses:  Cyclical vomiting  Dehydration    ED Discharge Orders         Ordered    prochlorperazine (COMPAZINE) 10 MG tablet  Every 8 hours PRN     02/20/19 1719    promethazine (PHENERGAN) 25 MG suppository  Every 6 hours PRN     02/20/19 1719           Duffy Bruce, MD 02/21/19 [phone]

## 2019-05-05 ENCOUNTER — Telehealth: Payer: Self-pay | Admitting: *Deleted

## 2019-05-05 NOTE — Telephone Encounter (Signed)
Pt calls because she is "running low grade fever of 97.4 and vomiting"  Explained to patient that 97.4 is not a low grade fever and upon review of her chart she has a history of vomiting.  Pt states that she has been trying to drink water but cant keep it down.    Advised to go to urgent care or ED as soon as she can.  She may need fluids.  Christen Bame, CMA

## 2019-05-06 ENCOUNTER — Emergency Department (HOSPITAL_COMMUNITY)
Admission: EM | Admit: 2019-05-06 | Discharge: 2019-05-06 | Disposition: A | Discharge status: Home / Self Care | Payer: Medicaid Other | Attending: Emergency Medicine | Admitting: Emergency Medicine

## 2019-05-06 ENCOUNTER — Other Ambulatory Visit: Payer: Self-pay

## 2019-05-06 ENCOUNTER — Encounter (HOSPITAL_COMMUNITY): Payer: Self-pay | Admitting: Emergency Medicine

## 2019-05-06 DIAGNOSIS — R112 Nausea with vomiting, unspecified: Secondary | ICD-10-CM | POA: Diagnosis not present

## 2019-05-06 DIAGNOSIS — F17210 Nicotine dependence, cigarettes, uncomplicated: Secondary | ICD-10-CM | POA: Insufficient documentation

## 2019-05-06 DIAGNOSIS — R1084 Generalized abdominal pain: Secondary | ICD-10-CM | POA: Diagnosis not present

## 2019-05-06 DIAGNOSIS — R109 Unspecified abdominal pain: Secondary | ICD-10-CM | POA: Diagnosis present

## 2019-05-06 DIAGNOSIS — R1115 Cyclical vomiting syndrome unrelated to migraine: Secondary | ICD-10-CM

## 2019-05-06 DIAGNOSIS — R1013 Epigastric pain: Secondary | ICD-10-CM | POA: Diagnosis not present

## 2019-05-06 DIAGNOSIS — R52 Pain, unspecified: Secondary | ICD-10-CM | POA: Diagnosis not present

## 2019-05-06 DIAGNOSIS — Z79899 Other long term (current) drug therapy: Secondary | ICD-10-CM | POA: Diagnosis not present

## 2019-05-06 LAB — COMPREHENSIVE METABOLIC PANEL
ALT: 12 U/L (ref 0–44)
AST: 28 U/L (ref 15–41)
Albumin: 5.1 g/dL — ABNORMAL HIGH (ref 3.5–5.0)
Alkaline Phosphatase: 64 U/L (ref 38–126)
Anion gap: 14 (ref 5–15)
BUN: 12 mg/dL (ref 6–20)
CO2: 17 mmol/L — ABNORMAL LOW (ref 22–32)
Calcium: 9.5 mg/dL (ref 8.9–10.3)
Chloride: 104 mmol/L (ref 98–111)
Creatinine, Ser: 0.65 mg/dL (ref 0.44–1.00)
GFR calc Af Amer: >60 mL/min (ref >60)
GFR calc non Af Amer: >60 mL/min (ref >60)
Glucose, Bld: 75 mg/dL (ref 70–99)
Potassium: 4.7 mmol/L (ref 3.5–5.1)
Sodium: 135 mmol/L (ref 135–145)
Total Bilirubin: 1.3 mg/dL — ABNORMAL HIGH (ref 0.3–1.2)
Total Protein: 9 g/dL — ABNORMAL HIGH (ref 6.5–8.1)

## 2019-05-06 LAB — CBC WITH DIFFERENTIAL/PLATELET
Abs Immature Granulocytes: 0.03 10*3/uL (ref 0.00–0.07)
Basophils Absolute: 0 10*3/uL (ref 0.0–0.1)
Basophils Relative: 0 %
Eosinophils Absolute: 0.1 10*3/uL (ref 0.0–0.5)
Eosinophils Relative: 1 %
HCT: 47.4 % — ABNORMAL HIGH (ref 36.0–46.0)
Hemoglobin: 14.7 g/dL (ref 12.0–15.0)
Immature Granulocytes: 0 %
Lymphocytes Relative: 20 %
Lymphs Abs: 2.3 10*3/uL (ref 0.7–4.0)
MCH: 27 pg (ref 26.0–34.0)
MCHC: 31 g/dL (ref 30.0–36.0)
MCV: 87 fL (ref 80.0–100.0)
Monocytes Absolute: 0.7 10*3/uL (ref 0.1–1.0)
Monocytes Relative: 6 %
Neutro Abs: 8.3 10*3/uL — ABNORMAL HIGH (ref 1.7–7.7)
Neutrophils Relative %: 73 %
Platelets: 236 10*3/uL (ref 150–400)
RBC: 5.45 MIL/uL — ABNORMAL HIGH (ref 3.87–5.11)
RDW: 14.2 % (ref 11.5–15.5)
WBC: 11.4 10*3/uL — ABNORMAL HIGH (ref 4.0–10.5)
nRBC: 0 % (ref 0.0–0.2)

## 2019-05-06 LAB — I-STAT BETA HCG BLOOD, ED (MC, WL, AP ONLY): I-stat hCG, quantitative: <5 m[IU]/mL (ref <5)

## 2019-05-06 LAB — LIPASE, BLOOD: Lipase: 27 U/L (ref 11–51)

## 2019-05-06 MED ORDER — METOCLOPRAMIDE HCL 10 MG PO TABS: 10.0000 mg | ORAL_TABLET | Freq: Four times a day (QID) | ORAL | 0 refills | Status: DC

## 2019-05-06 MED ORDER — LACTATED RINGERS IV BOLUS
1000.0000 mL | Freq: Once | INTRAVENOUS | Status: AC
  Administered 2019-05-06: 1000 mL via INTRAVENOUS

## 2019-05-06 MED ORDER — LIDOCAINE VISCOUS HCL 2 % MT SOLN
15.0000 mL | Freq: Once | OROMUCOSAL | Status: AC
  Administered 2019-05-06: 15 mL via ORAL
  Filled 2019-05-06: qty 15

## 2019-05-06 MED ORDER — HALOPERIDOL LACTATE 5 MG/ML IJ SOLN
5.0000 mg | Freq: Once | INTRAMUSCULAR | Status: AC
  Administered 2019-05-06: 5 mg via INTRAVENOUS
  Filled 2019-05-06: qty 1

## 2019-05-06 MED ORDER — ALUM & MAG HYDROXIDE-SIMETH 200-200-20 MG/5ML PO SUSP
30.0000 mL | Freq: Once | ORAL | Status: AC
  Administered 2019-05-06: 30 mL via ORAL
  Filled 2019-05-06: qty 30

## 2019-05-06 MED ORDER — PROMETHAZINE HCL 25 MG RE SUPP: 25.0000 mg | Freq: Four times a day (QID) | RECTAL | 0 refills | Status: DC | PRN

## 2019-05-06 MED ORDER — DICYCLOMINE HCL 20 MG PO TABS: 20.0000 mg | ORAL_TABLET | Freq: Three times a day (TID) | ORAL | 0 refills | Status: DC

## 2019-05-06 NOTE — Discharge Instructions (Signed)
Take the medications prescribed for your nausea and vomiting. Simple clear liquid diet is recommended.  Please return to the ER if your symptoms worsen; you have increased pain, fevers, chills, inability to keep any medications down, confusion.

## 2019-05-06 NOTE — ED Triage Notes (Signed)
Arrives via [facility] from home. C/C abd pain, and N/V. Started Sunday, has not been able to eat since then because food makes her nauseas.

## 2019-05-06 NOTE — ED Provider Notes (Signed)
[facility] DEPT Provider Note   CSN: [id number] Arrival date & time: 05/06/19  0302    History   Chief Complaint No chief complaint on file.   HPI Allison Leonard is a 43 y.o. female.     HPI  43 year old female comes in a chief complaint of abdominal pain.  She has known history of cyclic vomiting syndrome.  Patient reports that her current symptoms started about 3 days ago.  She gets about 5-6 emesis a day and has been nauseated throughout the day.  She is also having abdominal discomfort in the epigastric region.  Medical history is also positive for celiac disease.  She states that she stopped smoking marijuana in the last week, but otherwise she was a regular user.  She has had history of hysterectomy and appendectomy.  Her last BM was 3 days ago and she is passing flatus.  Past Medical History:  Diagnosis Date  . Acid reflux 07/04/2018  . Anemia   . Celiac disease/sprue   . Cyclic vomiting syndrome   . Generalized anxiety disorder 07/04/2018  . History of colon polyps    last colonoscopy 2018 removed multiple benign polyps  . Migraines 07/04/2018    Patient Active Problem List   Diagnosis Date Noted  . Metabolic acidosis 00/92/3300  . Volume depletion 11/19/2018  . Cannabis abuse 11/19/2018  . Nausea and vomiting 11/19/2018  . Cyclical vomiting 76/22/6333  . Stress 07/04/2018  . Acid reflux 07/04/2018  . Migraines 07/04/2018    Past Surgical History:  Procedure Laterality Date  . ABDOMINAL HYSTERECTOMY  2006  . APPENDECTOMY  1999  . COLONOSCOPY  2018  . ESOPHAGOGASTRODUODENOSCOPY  2018     OB History   No obstetric history on file.      Home Medications    Prior to Admission medications   Medication Sig Start Date End Date Taking? Authorizing Provider  aspirin-acetaminophen-caffeine (EXCEDRIN MIGRAINE) [phone] MG tablet Take 1 tablet by mouth every 4 (four) hours as needed for headache.    [provider]  dicyclomine (BENTYL) 20 MG tablet Take 1 tablet (20 mg total) by mouth 4 (four) times daily -  before meals and at bedtime. 05/06/19   Varney Biles, MD  famotidine (PEPCID) 20 MG tablet Take 1 tablet (20 mg total) by mouth 2 (two) times daily. 12/18/18   Thornton Park, MD  metoCLOPramide (REGLAN) 10 MG tablet Take 1 tablet (10 mg total) by mouth every 6 (six) hours. 05/06/19   Varney Biles, MD  pantoprazole (PROTONIX) 40 MG tablet Take 40 mg by mouth daily.    [provider]  prochlorperazine (COMPAZINE) 10 MG tablet Take 1 tablet (10 mg total) by mouth every 8 (eight) hours as needed for nausea or vomiting. 02/20/19   Duffy Bruce, MD  promethazine (PHENERGAN) 25 MG suppository Place 1 suppository (25 mg total) rectally every 6 (six) hours as needed for refractory nausea / vomiting. 05/06/19   Varney Biles, MD    Family History Family History  Problem Relation Age of Onset  . Diabetes Father   . Hypertension Father   . Hyperlipidemia Father   . Colon polyps Father   . Heart attack Maternal Grandmother   . Heart attack Paternal Grandmother   . Cervical cancer Maternal Aunt   . Throat cancer Maternal Uncle   . Stroke Maternal Uncle   . Down syndrome Son   . Colon cancer Neg Hx     Social History Social History  Tobacco Use  . Smoking status: Current Every Day Smoker    Packs/day: 0.50    Years: 7.00    Pack years: 3.50    Types: Cigarettes  . Smokeless tobacco: Never Used  Substance Use Topics  . Alcohol use: Not Currently  . Drug use: Not Currently    Types: Marijuana    Comment: quit a month ago per patient     Allergies   Zofran [ondansetron hcl] and Morphine and related   Review of Systems Review of Systems  Constitutional: Positive for activity change.  Gastrointestinal: Positive for abdominal pain, nausea and vomiting.  Genitourinary: Negative for dysuria.  Allergic/Immunologic: Negative for immunocompromised state.   Hematological: Does not bruise/bleed easily.  All other systems reviewed and are negative.    Physical Exam Updated Vital Signs BP (!) 147/83   Pulse 92   Temp 98.3 F (36.8 C) (Oral)   Resp 20   Ht 5' 3"  (1.6 m)   Wt 52.2 kg   SpO2 100%   BMI 20.37 kg/m   Physical Exam Vitals signs and nursing note reviewed.  Constitutional:      Appearance: She is well-developed.  HENT:     Head: Normocephalic and atraumatic.  Eyes:     Pupils: Pupils are equal, round, and reactive to light.  Neck:     Musculoskeletal: Neck supple.  Cardiovascular:     Rate and Rhythm: Normal rate and regular rhythm.     Heart sounds: Normal heart sounds. No murmur.  Pulmonary:     Effort: Pulmonary effort is normal. No respiratory distress.  Abdominal:     General: There is no distension.     Palpations: Abdomen is soft.     Tenderness: There is abdominal tenderness. There is guarding. There is no rebound.     Comments: Epigastric and periumbilical abdominal tenderness  Skin:    General: Skin is warm and dry.  Neurological:     Mental Status: She is alert and oriented to person, place, and time.      ED Treatments / Results  Labs (all labs ordered are listed, but only abnormal results are displayed) Labs Reviewed  COMPREHENSIVE METABOLIC PANEL - Abnormal; Notable for the following components:      Result Value   CO2 17 (*)    Total Protein 9.0 (*)    Albumin 5.1 (*)    Total Bilirubin 1.3 (*)    All other components within normal limits  CBC WITH DIFFERENTIAL/PLATELET - Abnormal; Notable for the following components:   WBC 11.4 (*)    RBC 5.45 (*)    HCT 47.4 (*)    Neutro Abs 8.3 (*)    All other components within normal limits  LIPASE, BLOOD  I-STAT BETA HCG BLOOD, ED ([facility], [facility], [facility] ONLY)    EKG None  Radiology No results found.  Procedures Procedures (including critical care time)  Medications Ordered in ED Medications  lactated ringers bolus 1,000 mL (1,000 mLs  Intravenous New Bag/Given 05/06/19 0436)  haloperidol lactate (HALDOL) injection 5 mg (5 mg Intravenous Given 05/06/19 0437)  alum & mag hydroxide-simeth (MAALOX/MYLANTA) 200-200-20 MG/5ML suspension 30 mL (30 mLs Oral Given 05/06/19 0439)    And  lidocaine (XYLOCAINE) 2 % viscous mouth solution 15 mL (15 mLs Oral Given 05/06/19 0439)     Initial Impression / Assessment and Plan / ED Course  I have reviewed the triage vital signs and the nursing notes.  Pertinent labs & imaging results that were  available during my care of the patient were reviewed by me and considered in my medical decision making (see chart for details).  Clinical Course as of May 05 510  Wed May 06, 2019  0511 Pt reassessed. Pt's VSS and WNL. Pt's cap refill < 3 seconds. Pt has been hydrated in the ER and now passed po challenge. We will discharge with antiemetic. Strict ER return precautions have been discussed and pt will return if he is unable to tolerate fluids and symptoms are getting worse.    [AN]    Clinical Course User Index [AN] Varney Biles, MD       DDx includes: Pancreatitis Hepatobiliary pathology including cholecystitis Gastritis/PUD SBO Cyclic vomiting syndrome IBD  Patient comes in a chief complaint of abdominal pain, nausea, vomiting.  She has history of similar symptoms in the past.  Patient is unsure what provokes her symptoms.  She has no focal abdominal tenderness or peritoneal findings.  It appears to [place] clinically that she has cyclic vomiting syndrome again.  We will give her medications for symptom control.   Final Clinical Impressions(s) / ED Diagnoses   Final diagnoses:  Cyclic vomiting syndrome    ED Discharge Orders         Ordered    dicyclomine (BENTYL) 20 MG tablet  3 times daily before meals & bedtime     05/06/19 0509    promethazine (PHENERGAN) 25 MG suppository  Every 6 hours PRN     05/06/19 0510    metoCLOPramide (REGLAN) 10 MG tablet  Every 6 hours     05/06/19  0510           Varney Biles, MD 05/06/19 [phone]

## 2019-05-07 ENCOUNTER — Emergency Department (HOSPITAL_COMMUNITY)
Admission: EM | Admit: 2019-05-07 | Discharge: 2019-05-07 | Disposition: A | Discharge status: Home / Self Care | Payer: Medicaid Other | Attending: Emergency Medicine | Admitting: Emergency Medicine

## 2019-05-07 ENCOUNTER — Encounter (HOSPITAL_COMMUNITY): Payer: Self-pay

## 2019-05-07 ENCOUNTER — Emergency Department (HOSPITAL_COMMUNITY): Payer: Medicaid Other

## 2019-05-07 ENCOUNTER — Other Ambulatory Visit: Payer: Self-pay

## 2019-05-07 DIAGNOSIS — K920 Hematemesis: Secondary | ICD-10-CM | POA: Diagnosis not present

## 2019-05-07 DIAGNOSIS — R109 Unspecified abdominal pain: Secondary | ICD-10-CM | POA: Diagnosis not present

## 2019-05-07 DIAGNOSIS — R1084 Generalized abdominal pain: Secondary | ICD-10-CM | POA: Diagnosis not present

## 2019-05-07 DIAGNOSIS — Z20828 Contact with and (suspected) exposure to other viral communicable diseases: Secondary | ICD-10-CM | POA: Diagnosis not present

## 2019-05-07 DIAGNOSIS — R1011 Right upper quadrant pain: Secondary | ICD-10-CM | POA: Diagnosis not present

## 2019-05-07 DIAGNOSIS — R000 Tachycardia, unspecified: Secondary | ICD-10-CM | POA: Diagnosis not present

## 2019-05-07 DIAGNOSIS — R52 Pain, unspecified: Secondary | ICD-10-CM | POA: Diagnosis not present

## 2019-05-07 DIAGNOSIS — R112 Nausea with vomiting, unspecified: Secondary | ICD-10-CM

## 2019-05-07 DIAGNOSIS — R1010 Upper abdominal pain, unspecified: Secondary | ICD-10-CM

## 2019-05-07 DIAGNOSIS — R1013 Epigastric pain: Secondary | ICD-10-CM | POA: Insufficient documentation

## 2019-05-07 DIAGNOSIS — F17210 Nicotine dependence, cigarettes, uncomplicated: Secondary | ICD-10-CM | POA: Diagnosis not present

## 2019-05-07 DIAGNOSIS — Z79899 Other long term (current) drug therapy: Secondary | ICD-10-CM | POA: Insufficient documentation

## 2019-05-07 DIAGNOSIS — Z03818 Encounter for observation for suspected exposure to other biological agents ruled out: Secondary | ICD-10-CM | POA: Diagnosis not present

## 2019-05-07 DIAGNOSIS — R110 Nausea: Secondary | ICD-10-CM | POA: Diagnosis not present

## 2019-05-07 DIAGNOSIS — R1012 Left upper quadrant pain: Secondary | ICD-10-CM | POA: Diagnosis not present

## 2019-05-07 LAB — CBC
HCT: 52.8 % — ABNORMAL HIGH (ref 36.0–46.0)
Hemoglobin: 15.9 g/dL — ABNORMAL HIGH (ref 12.0–15.0)
MCH: 25.9 pg — ABNORMAL LOW (ref 26.0–34.0)
MCHC: 30.1 g/dL (ref 30.0–36.0)
MCV: 85.9 fL (ref 80.0–100.0)
Platelets: 314 10*3/uL (ref 150–400)
RBC: 6.15 MIL/uL — ABNORMAL HIGH (ref 3.87–5.11)
RDW: 14.5 % (ref 11.5–15.5)
WBC: 10.8 10*3/uL — ABNORMAL HIGH (ref 4.0–10.5)
nRBC: 0 % (ref 0.0–0.2)

## 2019-05-07 LAB — URINALYSIS, ROUTINE W REFLEX MICROSCOPIC
Bilirubin Urine: NEGATIVE
Glucose, UA: NEGATIVE mg/dL
Ketones, ur: 80 mg/dL — AB
Leukocytes,Ua: NEGATIVE
Nitrite: NEGATIVE
Protein, ur: 100 mg/dL — AB
Specific Gravity, Urine: 1.014 (ref 1.005–1.030)
pH: 5 (ref 5.0–8.0)

## 2019-05-07 LAB — COMPREHENSIVE METABOLIC PANEL
ALT: 14 U/L (ref 0–44)
AST: 19 U/L (ref 15–41)
Albumin: 4.7 g/dL (ref 3.5–5.0)
Alkaline Phosphatase: 70 U/L (ref 38–126)
Anion gap: 15 (ref 5–15)
BUN: 13 mg/dL (ref 6–20)
CO2: 12 mmol/L — ABNORMAL LOW (ref 22–32)
Calcium: 8.8 mg/dL — ABNORMAL LOW (ref 8.9–10.3)
Chloride: 107 mmol/L (ref 98–111)
Creatinine, Ser: 0.62 mg/dL (ref 0.44–1.00)
GFR calc Af Amer: >60 mL/min (ref >60)
GFR calc non Af Amer: >60 mL/min (ref >60)
Glucose, Bld: 102 mg/dL — ABNORMAL HIGH (ref 70–99)
Potassium: 4.4 mmol/L (ref 3.5–5.1)
Sodium: 134 mmol/L — ABNORMAL LOW (ref 135–145)
Total Bilirubin: 0.9 mg/dL (ref 0.3–1.2)
Total Protein: 8.5 g/dL — ABNORMAL HIGH (ref 6.5–8.1)

## 2019-05-07 LAB — SARS CORONAVIRUS 2 BY RT PCR (HOSPITAL ORDER, PERFORMED IN ~~LOC~~ HOSPITAL LAB): SARS Coronavirus 2: NEGATIVE

## 2019-05-07 LAB — LIPASE, BLOOD: Lipase: 26 U/L (ref 11–51)

## 2019-05-07 LAB — MAGNESIUM
Magnesium: 2.1 mg/dL (ref 1.7–2.4)
Magnesium: 1.8 mg/dL (ref 1.7–2.4)

## 2019-05-07 LAB — TROPONIN I (HIGH SENSITIVITY)
Troponin I (High Sensitivity): 5 ng/L (ref <18)
Troponin I (High Sensitivity): 8 ng/L (ref <18)

## 2019-05-07 LAB — I-STAT BETA HCG BLOOD, ED (MC, WL, AP ONLY): I-stat hCG, quantitative: <5 m[IU]/mL (ref <5)

## 2019-05-07 LAB — LACTIC ACID, PLASMA
Lactic Acid, Venous: 1.2 mmol/L (ref 0.5–1.9)
Lactic Acid, Venous: 0.5 mmol/L (ref 0.5–1.9)

## 2019-05-07 IMAGING — US ULTRASOUND ABDOMEN LIMITED
1 series · 14 of 25 positions shown · non-contrast
Comparison: [DATE]

CLINICAL DATA: Abdominal pain

EXAM:
ULTRASOUND ABDOMEN LIMITED RIGHT UPPER QUADRANT

[Series 1: ultrasound abdomen limited · 26 acquisitions, 14 frames shown]
[im 1/26]
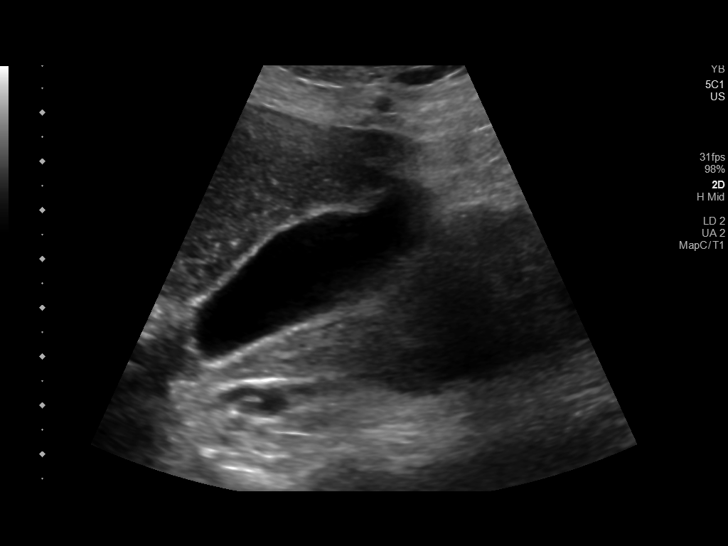
[im 3/26]
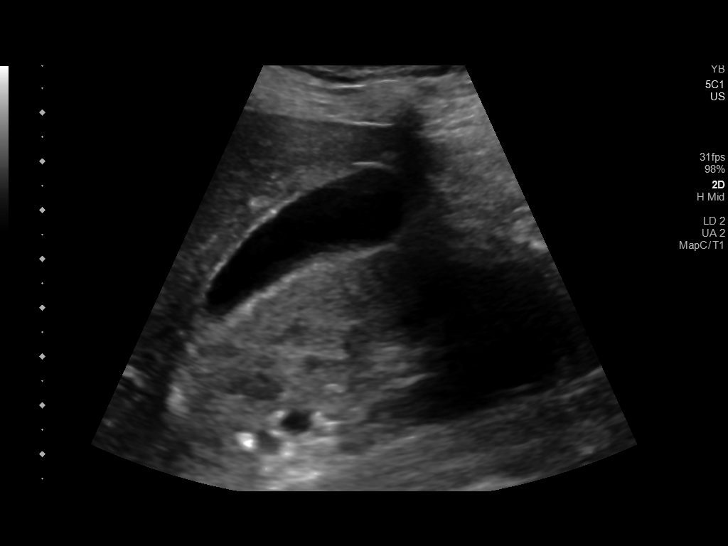
[im 5/26]
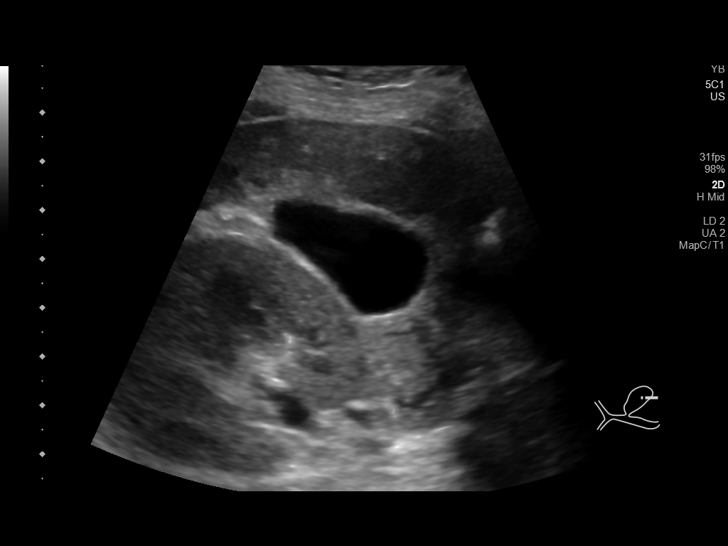
[im 7/26]
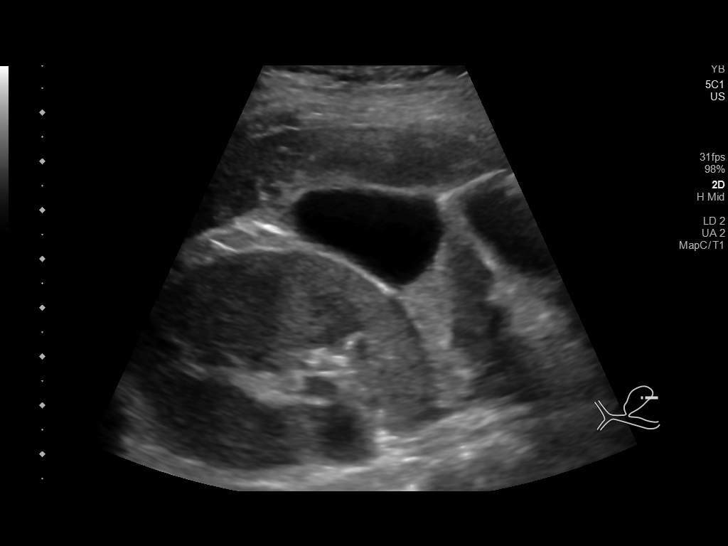
[im 9/26]
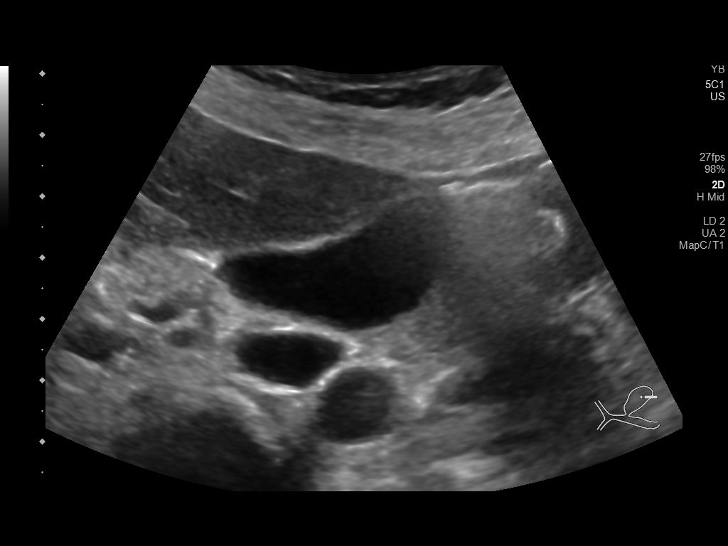
[im 10/26]
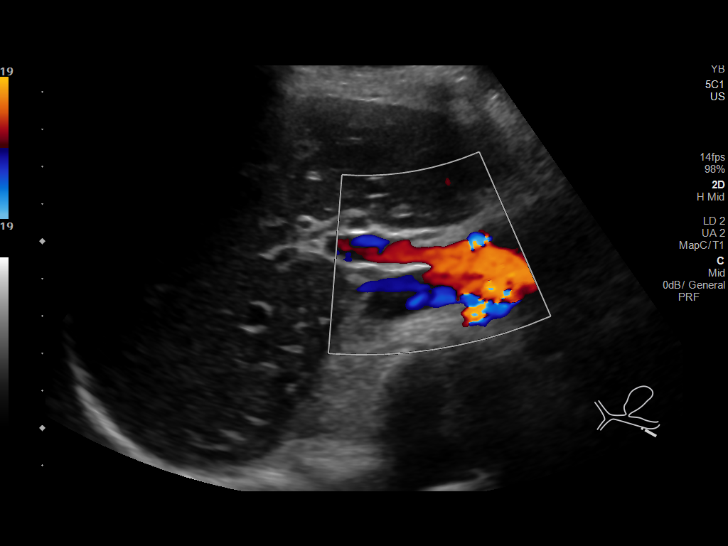
[im 12/26]
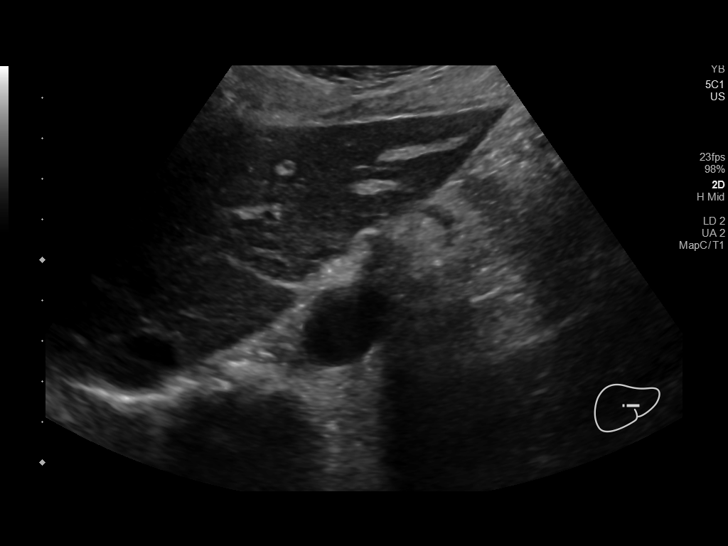
[im 14/26]
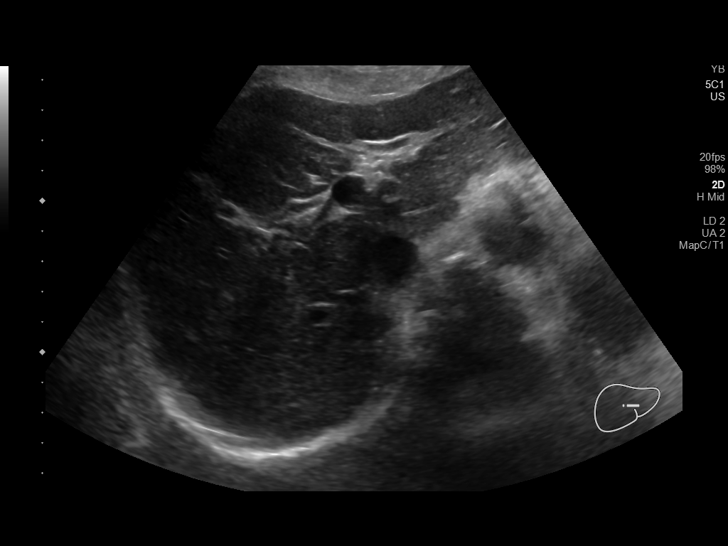
[im 16/26]
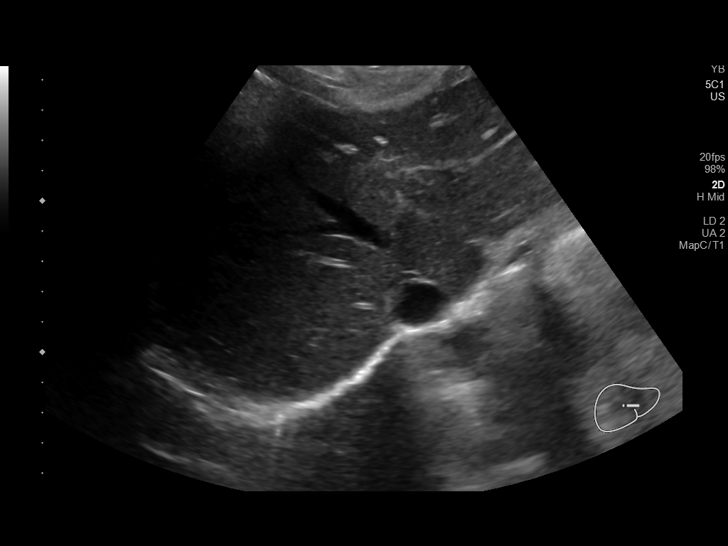
[im 17/26]
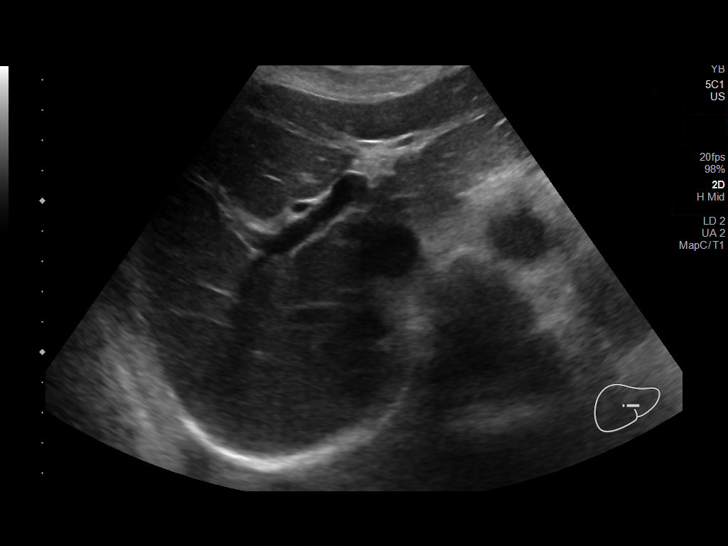
[im 19/26]
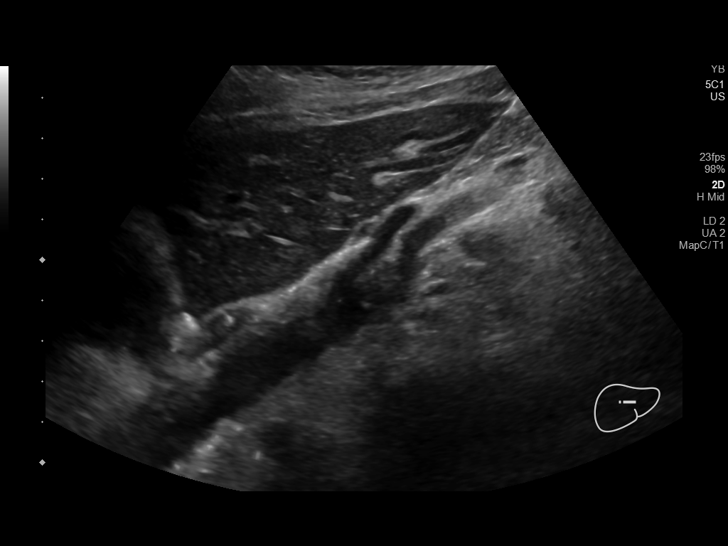
[im 21/26]
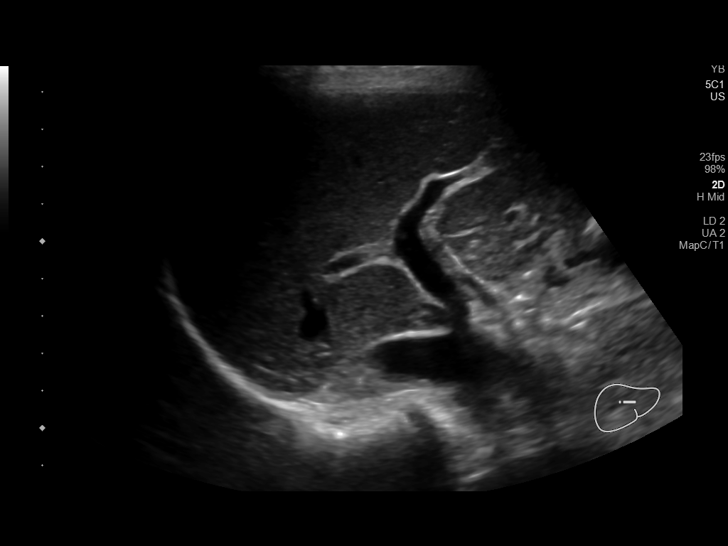
[im 23/26]
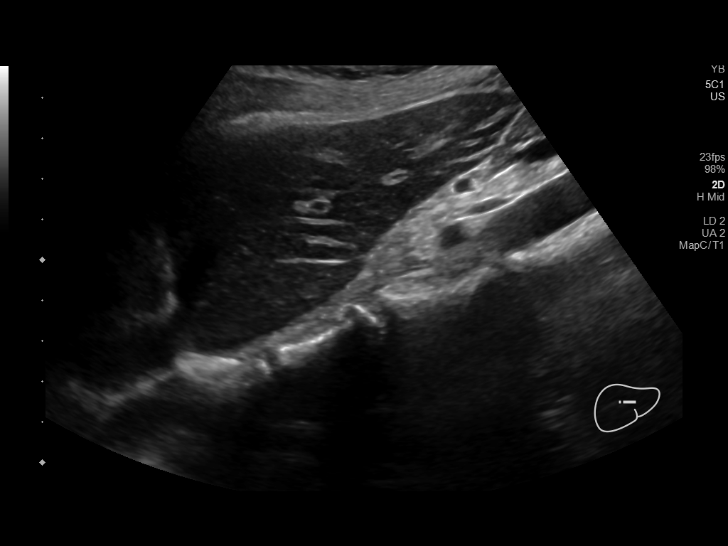
[im 26/26]
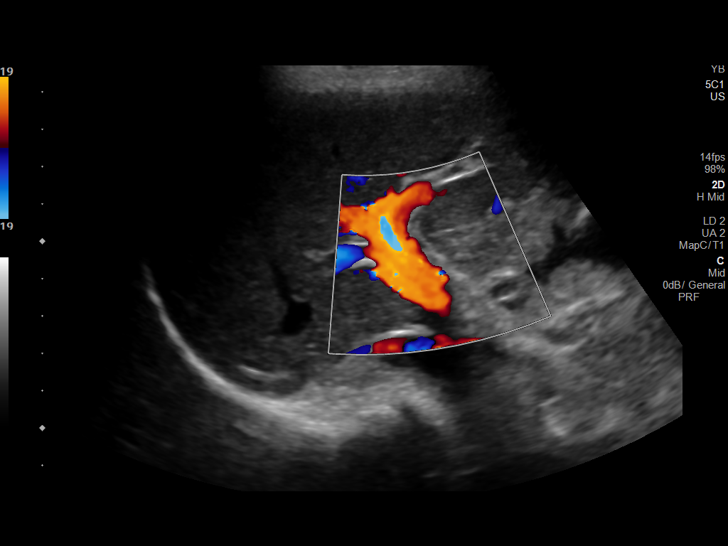

[14 of 25 positions shown; findings below may reference images not displayed]

FINDINGS: Gallbladder:

No gallstones or wall thickening visualized. No sonographic Murphy
sign noted by sonographer.

Common bile duct:

Diameter: 4 mm

Liver:

No focal lesion identified. Within normal limits in parenchymal
echogenicity. Portal vein is patent on color Doppler imaging with
normal direction of blood flow towards the liver.
IMPRESSION: No acute abnormality noted.

## 2019-05-07 MED ORDER — FENTANYL CITRATE (PF) 100 MCG/2ML IJ SOLN
50.0000 ug | Freq: Once | INTRAMUSCULAR | Status: AC
  Administered 2019-05-07: 50 ug via INTRAVENOUS
  Filled 2019-05-07: qty 2

## 2019-05-07 MED ORDER — SODIUM CHLORIDE 0.9 % IV BOLUS
1000.0000 mL | Freq: Once | INTRAVENOUS | Status: AC
  Administered 2019-05-07: 1000 mL via INTRAVENOUS

## 2019-05-07 MED ORDER — LORAZEPAM 0.5 MG PO TABS: 1.0000 mg | ORAL_TABLET | Freq: Three times a day (TID) | ORAL | 0 refills | Status: DC | PRN

## 2019-05-07 MED ORDER — LORAZEPAM 2 MG/ML IJ SOLN
1.0000 mg | Freq: Once | INTRAMUSCULAR | Status: AC
  Administered 2019-05-07: 1 mg via INTRAVENOUS
  Filled 2019-05-07: qty 1

## 2019-05-07 NOTE — ED Notes (Signed)
Scanner broken in room 13. Secretary made aware.

## 2019-05-07 NOTE — ED Notes (Signed)
RN advised per Lab recollected needed for light green tube. [employer]

## 2019-05-07 NOTE — Discharge Instructions (Signed)
Your work-up today was overall reassuring.  Your heart rate improved after some of the fluids and your nausea improved after the Ativan.  We had to limit the medications we gave you due to a prolonged QTC so please avoid some of the nausea medication including the Compazine, Reglan, or Zofran.  Please use the Ativan to help with your nausea.  Please try to maintain hydration and avoid marijuana use.  Please follow-up with your primary doctor and your GI doctor.  If any symptoms change or worsen, please return to the nearest emergency department.

## 2019-05-07 NOTE — ED Provider Notes (Signed)
[facility] DEPT Provider Note   CSN: [id number] Arrival date & time: 05/07/19  1856     History   Chief Complaint Chief Complaint  Patient presents with   Abdominal Pain   Emesis   Tachycardia    HPI Allison Leonard is a 43 y.o. female.     The history is provided by the patient, medical records and the EMS personnel. No language interpreter was used.  Abdominal Pain Pain location:  Epigastric, RUQ and LUQ Pain quality: aching, cramping, dull and sharp   Pain radiates to:  Does not radiate Pain severity:  Severe Onset quality:  Gradual Duration:  5 days Timing:  Intermittent Progression:  Waxing and waning Chronicity:  Recurrent Context: not alcohol use and not trauma   Relieved by:  Nothing Worsened by:  Eating Associated symptoms: constipation, nausea and vomiting   Associated symptoms: no anorexia, no chest pain, no chills, no cough, no diarrhea, no dysuria, no fatigue, no fever, no shortness of breath, no vaginal bleeding and no vaginal discharge   Emesis Associated symptoms: abdominal pain   Associated symptoms: no chills, no cough, no diarrhea, no fever and no headaches     Past Medical History:  Diagnosis Date   Acid reflux 07/04/2018   Anemia    Celiac disease/sprue    Cyclic vomiting syndrome    Generalized anxiety disorder 07/04/2018   History of colon polyps    last colonoscopy 2018 removed multiple benign polyps   Migraines 07/04/2018    Patient Active Problem List   Diagnosis Date Noted   Metabolic acidosis 31/49/7026   Volume depletion 11/19/2018   Cannabis abuse 11/19/2018   Nausea and vomiting 37/85/8850   Cyclical vomiting 27/74/1287   Stress 07/04/2018   Acid reflux 07/04/2018   Migraines 07/04/2018    Past Surgical History:  Procedure Laterality Date   ABDOMINAL HYSTERECTOMY  2006   APPENDECTOMY  1999   COLONOSCOPY  2018   ESOPHAGOGASTRODUODENOSCOPY  2018     OB  History   No obstetric history on file.      Home Medications    Prior to Admission medications   Medication Sig Start Date End Date Taking? Authorizing Provider  aspirin-acetaminophen-caffeine (EXCEDRIN MIGRAINE) [phone] MG tablet Take 1 tablet by mouth every 4 (four) hours as needed for headache.    [provider]  dicyclomine (BENTYL) 20 MG tablet Take 1 tablet (20 mg total) by mouth 4 (four) times daily -  before meals and at bedtime. 05/06/19   Varney Biles, MD  famotidine (PEPCID) 20 MG tablet Take 1 tablet (20 mg total) by mouth 2 (two) times daily. 12/18/18   Thornton Park, MD  metoCLOPramide (REGLAN) 10 MG tablet Take 1 tablet (10 mg total) by mouth every 6 (six) hours. 05/06/19   Varney Biles, MD  pantoprazole (PROTONIX) 40 MG tablet Take 40 mg by mouth daily.    [provider]  prochlorperazine (COMPAZINE) 10 MG tablet Take 1 tablet (10 mg total) by mouth every 8 (eight) hours as needed for nausea or vomiting. 02/20/19   Duffy Bruce, MD  promethazine (PHENERGAN) 25 MG suppository Place 1 suppository (25 mg total) rectally every 6 (six) hours as needed for refractory nausea / vomiting. 05/06/19   Varney Biles, MD    Family History Family History  Problem Relation Age of Onset   Diabetes Father    Hypertension Father    Hyperlipidemia Father    Colon polyps Father    Heart  attack Maternal Grandmother    Heart attack Paternal Grandmother    Cervical cancer Maternal Aunt    Throat cancer Maternal Uncle    Stroke Maternal Uncle    Down syndrome Son    Colon cancer Neg Hx     Social History Social History   Tobacco Use   Smoking status: Current Every Day Smoker    Packs/day: 0.50    Years: 7.00    Pack years: 3.50    Types: Cigarettes   Smokeless tobacco: Never Used  Substance Use Topics   Alcohol use: Not Currently   Drug use: Not Currently    Types: Marijuana    Comment: quit a month ago per patient      Allergies   Zofran [ondansetron hcl] and Morphine and related   Review of Systems Review of Systems  Constitutional: Negative for chills, fatigue and fever.  HENT: Negative for congestion.   Respiratory: Negative for cough, chest tightness and shortness of breath.   Cardiovascular: Negative for chest pain, palpitations and leg swelling.  Gastrointestinal: Positive for abdominal pain, constipation, nausea and vomiting. Negative for abdominal distention, anorexia and diarrhea.  Genitourinary: Negative for dysuria, flank pain, frequency, vaginal bleeding and vaginal discharge.  Musculoskeletal: Negative for back pain, neck pain and neck stiffness.  Skin: Negative for wound.  Neurological: Negative for headaches.     Physical Exam Updated Vital Signs BP (!) 139/98    Pulse (!) 136    Temp 98.8 F (37.1 C) (Oral)    Resp (!) 34    SpO2 97%   Physical Exam Vitals signs and nursing note reviewed.  Constitutional:      General: She is not in acute distress.    Appearance: She is well-developed.  HENT:     Head: Normocephalic and atraumatic.  Eyes:     Extraocular Movements: Extraocular movements intact.     Conjunctiva/sclera: Conjunctivae normal.  Neck:     Musculoskeletal: Neck supple.  Cardiovascular:     Rate and Rhythm: Regular rhythm. Tachycardia present.     Heart sounds: Normal heart sounds. No murmur.  Pulmonary:     Effort: Pulmonary effort is normal. No respiratory distress.     Breath sounds: Normal breath sounds.  Abdominal:     General: Abdomen is flat. Bowel sounds are normal.     Palpations: Abdomen is soft.     Tenderness: There is abdominal tenderness in the right upper quadrant, epigastric area and left upper quadrant. There is no right CVA tenderness, left CVA tenderness, guarding or rebound.    Skin:    General: Skin is warm and dry.  Neurological:     General: No focal deficit present.     Mental Status: She is alert.  Psychiatric:         Mood and Affect: Mood normal.      ED Treatments / Results  Labs (all labs ordered are listed, but only abnormal results are displayed) Labs Reviewed  CBC - Abnormal; Notable for the following components:      Result Value   WBC 10.8 (*)    RBC 6.15 (*)    Hemoglobin 15.9 (*)    HCT 52.8 (*)    MCH 25.9 (*)    All other components within normal limits  COMPREHENSIVE METABOLIC PANEL - Abnormal; Notable for the following components:   Sodium 134 (*)    CO2 12 (*)    Glucose, Bld 102 (*)    Calcium 8.8 (*)  Total Protein 8.5 (*)    All other components within normal limits  SARS CORONAVIRUS 2 (HOSPITAL ORDER, [facility])  URINE CULTURE  LACTIC ACID, PLASMA  LACTIC ACID, PLASMA  MAGNESIUM  TROPONIN I (HIGH SENSITIVITY)  TROPONIN I (HIGH SENSITIVITY)  LIPASE, BLOOD  MAGNESIUM  URINALYSIS, ROUTINE W REFLEX MICROSCOPIC  I-STAT BETA HCG BLOOD, ED ([facility], [facility], [facility] ONLY)    EKG EKG Interpretation  Date/Time:  Thursday May 07 2019 09:47:52 EDT Ventricular Rate:  146 PR Interval:    QRS Duration: 245 QT Interval:  374 QTC Calculation: 583 R Axis:   94 Text Interpretation:  Sinus tachycardia LAE, consider biatrial enlargement RBBB and LPFB Left ventricular hypertrophy Probable anterior infarct, age indeterminate Prolonged QT interval When compared to prior, long Qt and faster rate. No STEMI Confirmed by Antony Blackbird [phone]) on 05/07/2019 9:52:09 AM Also confirmed by Antony Blackbird [phone]), editor Philomena Doheny [phone])  on 05/07/2019 12:17:52 PM   Radiology US Abdomen Limited Ruq  Result Date: 05/07/2019 CLINICAL DATA:  Abdominal pain EXAM: ULTRASOUND ABDOMEN LIMITED RIGHT UPPER QUADRANT COMPARISON:  11/20/2018 FINDINGS: Gallbladder: No gallstones or wall thickening visualized. No sonographic Murphy sign noted by sonographer. Common bile duct: Diameter: 4 mm Liver: No focal lesion identified. Within normal limits in parenchymal echogenicity. Portal vein is  patent on color Doppler imaging with normal direction of blood flow towards the liver. IMPRESSION: No acute abnormality noted. Electronically Signed   By: Inez Catalina M.D.   On: 05/07/2019 11:40    Procedures Procedures (including critical care time)  Medications Ordered in ED Medications  sodium chloride 0.9 % bolus 1,000 mL (0 mLs Intravenous Stopped 05/07/19 1200)  LORazepam (ATIVAN) injection 1 mg (1 mg Intravenous Given 05/07/19 1100)  fentaNYL (SUBLIMAZE) injection 50 mcg (50 mcg Intravenous Given 05/07/19 1100)  sodium chloride 0.9 % bolus 1,000 mL (0 mLs Intravenous Stopped 05/07/19 1439)     Initial Impression / Assessment and Plan / ED Course  I have reviewed the triage vital signs and the nursing notes.  Pertinent labs & imaging results that were available during my care of the patient were reviewed by me and considered in my medical decision making (see chart for details).        Jeselle S. Elling is a 43 y.o. female with a past medical history significant for anxiety, migraines, and prior cyclic vomiting syndrome who presents for continued nausea, vomiting, some hematemesis, and abdominal pain.  Patient reports that she was seen yesterday and has been having ongoing symptoms for the last 5 days.  She reports he is not tolerating any p.o. food or fluids.  She reports that she came yesterday and had medications that made her feel better and she was discharged home.  She reports that since leaving she has had worsened symptoms and still having a small amount of some streaky blood in her emesis.  She denies any diarrhea but reports some constipation.  She denies any blood in her bowel movements.  She reports abdominal pain is severe in her upper abdomen primarily.  She denies any history of gallbladder disease.  She denies any urinary symptoms or vaginal symptoms.  She reports she is still passing flatus.  No history of bowel obstruction.  She reports has not used marijuana in over  2 or 3 weeks.  On exam, patient had upper abdominal tenderness.  Lungs clear chest nontender.  Patient is very tachycardic with a rate around 150.  Legs nontender, no  evidence of trauma.  CVA area is nontender.  Patient actively vomiting on my initial exam.  Oral exam unremarkable.  EKG shows no STEMI but does show some prolonged QTC from prior as well as tachycardia.  With the patient's intolerance of p.o. I am concerned about managing her nausea with the prolonged QTC.  Instead of the antiemetics and Haldol given last time, she will instead be given Ativan and pain medication.  She will have a right upper quadrant ultrasound and labs.  If patient is able to tolerate p.o. with her EKG normalities, she may require admission.  Will hold on GI cocktail as patient reports that she will "throw it right up" if we try to give it at this time."    Anticipate reassessment after work-up.  Patient will much better after medications and fluids.  Patient's coronavirus test negative.  Troponin was negative twice.  Lactic acid not elevated.  Patient's CO2 slightly low likely due to her nausea and vomiting.  Lipase and magnesium normal.  Right upper quadrant ultrasound shows no acute abnormality.  Urinalysis on process but as patient was feeling better she wanted to go home.  Will call if significant UTIs discovered on results.  Due to her prolonged QTC, she will be given prescription for Ativan and she was instructed to not take the medication she was given on previous visits.  She agreed to this plan.  Patient was able to eat and drink after rehydration and medications.  Her heart rate improved after the fluids.  Patient was able to stand without feeling lightheaded and agreed with discharge.  Patient will follow-up with her PCP and her gastroenterologist as we discussed.  She understands return precautions for further worsening symptoms.  She has no other questions or concerns and was discharged in good  condition.   Final Clinical Impressions(s) / ED Diagnoses   Final diagnoses:  Upper abdominal pain  Epigastric pain  Non-intractable vomiting with nausea, unspecified vomiting type    ED Discharge Orders         Ordered    LORazepam (ATIVAN) 0.5 MG tablet  3 times daily PRN     05/07/19 1508         Clinical Impression: 1. Epigastric pain   2. Upper abdominal pain   3. Non-intractable vomiting with nausea, unspecified vomiting type     Disposition: Discharge  Condition: Good  I have discussed the results, Dx and Tx plan with the pt(& family if present). He/she/they expressed understanding and agree(s) with the plan. Discharge instructions discussed at great length. Strict return precautions discussed and pt &/or family have verbalized understanding of the instructions. No further questions at time of discharge.    New Prescriptions   LORAZEPAM (ATIVAN) 0.5 MG TABLET    Take 2 tablets (1 mg total) by mouth 3 (three) times daily as needed (nausea and vomiting).    Follow Up: Pana Fletcher [phone] [phone] Schedule an appointment as soon as possible for a visit    [facility] DEPT [facility] [phone] [place]       Torian Quintero, Gwenyth Allegra, MD 05/07/19 1524

## 2019-05-07 NOTE — ED Notes (Signed)
Bed: SZ56 Expected date:  Expected time:  Means of arrival:  Comments: EMS-43 yo vomiting blood, HR 140s

## 2019-05-07 NOTE — ED Triage Notes (Signed)
Patient BIB EMS from home with complaints of abdominal pain- was seen at [facility] yesterday for same. Patient reports the pain is to LUQ since Sunday. Patient reports being discharged after fluids and pain management yesterday- and began vomiting again this morning. Patient tachycardic in triage.  EMS VS: CBG 116, HR 140, BP 155/95, RR 18, T97.42F, 97% RA.

## 2019-05-08 LAB — URINE CULTURE: Culture: <10000 — AB

## 2019-05-11 ENCOUNTER — Ambulatory Visit (INDEPENDENT_AMBULATORY_CARE_PROVIDER_SITE_OTHER): Payer: Medicaid Other | Admitting: Family Medicine

## 2019-05-11 ENCOUNTER — Other Ambulatory Visit: Payer: Self-pay

## 2019-05-11 ENCOUNTER — Ambulatory Visit (HOSPITAL_COMMUNITY)
Admission: RE | Admit: 2019-05-11 | Discharge: 2019-05-11 | Disposition: A | Discharge status: Home / Self Care | Payer: Medicaid Other | Source: Ambulatory Visit | Attending: Family Medicine | Admitting: Family Medicine

## 2019-05-11 ENCOUNTER — Encounter: Payer: Self-pay | Admitting: Family Medicine

## 2019-05-11 ENCOUNTER — Other Ambulatory Visit: Payer: Self-pay | Admitting: Family Medicine

## 2019-05-11 ENCOUNTER — Other Ambulatory Visit (HOSPITAL_COMMUNITY)
Admission: RE | Admit: 2019-05-11 | Discharge: 2019-05-11 | Disposition: A | Discharge status: Home / Self Care | Payer: Medicaid Other | Source: Ambulatory Visit | Attending: Family Medicine | Admitting: Family Medicine

## 2019-05-11 ENCOUNTER — Ambulatory Visit: Payer: Medicaid Other | Admitting: Family Medicine

## 2019-05-11 VITALS — BP 120/80 | HR 138 | Resp 34 | Wt 93.8 lb

## 2019-05-11 DIAGNOSIS — E869 Volume depletion, unspecified: Secondary | ICD-10-CM | POA: Diagnosis not present

## 2019-05-11 DIAGNOSIS — Z124 Encounter for screening for malignant neoplasm of cervix: Secondary | ICD-10-CM | POA: Diagnosis not present

## 2019-05-11 DIAGNOSIS — Z113 Encounter for screening for infections with a predominantly sexual mode of transmission: Secondary | ICD-10-CM | POA: Diagnosis not present

## 2019-05-11 DIAGNOSIS — R636 Underweight: Secondary | ICD-10-CM | POA: Diagnosis not present

## 2019-05-11 DIAGNOSIS — A599 Trichomoniasis, unspecified: Secondary | ICD-10-CM

## 2019-05-11 DIAGNOSIS — F12988 Cannabis use, unspecified with other cannabis-induced disorder: Secondary | ICD-10-CM

## 2019-05-11 DIAGNOSIS — N920 Excessive and frequent menstruation with regular cycle: Secondary | ICD-10-CM | POA: Diagnosis not present

## 2019-05-11 DIAGNOSIS — R112 Nausea with vomiting, unspecified: Secondary | ICD-10-CM | POA: Diagnosis not present

## 2019-05-11 DIAGNOSIS — R000 Tachycardia, unspecified: Secondary | ICD-10-CM

## 2019-05-11 DIAGNOSIS — R634 Abnormal weight loss: Secondary | ICD-10-CM | POA: Diagnosis not present

## 2019-05-11 DIAGNOSIS — K920 Hematemesis: Secondary | ICD-10-CM

## 2019-05-11 DIAGNOSIS — N939 Abnormal uterine and vaginal bleeding, unspecified: Secondary | ICD-10-CM

## 2019-05-11 DIAGNOSIS — R9431 Abnormal electrocardiogram [ECG] [EKG]: Secondary | ICD-10-CM | POA: Insufficient documentation

## 2019-05-11 DIAGNOSIS — F1290 Cannabis use, unspecified, uncomplicated: Secondary | ICD-10-CM

## 2019-05-11 NOTE — Patient Instructions (Addendum)
Dear Allison Leonard. Sloan,   It was very nice to see you! Thank you for taking your time to come in to be seen. Today, we discussed the following:   Nausea Vomiting   referral to GI  Refilled antinausea medications  I will call you with the results from your tests today  Please follow up in 1 to 2 weeks or sooner for concerning or worsening symptoms.  If you have any dizziness, fast heart beat, difficulty breathing, episodes of falling out, and continue to have persistent nausea and vomiting and are unable to keep anything down, please go to the ED.  Please call the office if you have any questions that are nonemergent or if you want to be seen.  For any emergent issues, please go to the ED   Be well,   Dr. Zettie Cooley [facility] [phone]   Sign up for MyChart for instant access to your health profile, labs, orders, upcoming appointments or to contact your provider with questions.    Nausea, Adult Nausea is the feeling that you have an upset stomach or that you are about to vomit. Nausea on its own is not usually a serious concern, but it may be an early sign of a more serious medical problem. As nausea gets worse, it can lead to vomiting. If vomiting develops, or if you are not able to drink enough fluids, you are at risk of becoming dehydrated. Dehydration can make you tired and thirsty, cause you to have a dry mouth, and decrease how often you urinate. Older adults and people with other diseases or a weak disease-fighting system (immune system) are at higher risk for dehydration. The main goals of treating your nausea are:  To relieve your nausea.  To limit repeated nausea episodes.  To prevent vomiting and dehydration. Follow these instructions at home: Watch your symptoms for any changes. Tell your health care provider about them. Follow these instructions as told by your health care provider. Eating and drinking      Take an oral rehydration  solution (ORS). This is a drink that is sold at pharmacies and retail stores.  Drink clear fluids slowly and in small amounts as you are able. Clear fluids include water, ice chips, low-calorie sports drinks, and fruit juice that has water added (diluted fruit juice).  Eat bland, easy-to-digest foods in small amounts as you are able. These foods include bananas, applesauce, rice, lean meats, toast, and crackers.  Avoid drinking fluids that contain a lot of sugar or caffeine, such as energy drinks, sports drinks, and soda.  Avoid alcohol.  Avoid spicy or fatty foods. General instructions  Take over-the-counter and prescription medicines only as told by your health care provider.  Rest at home while you recover.  Drink enough fluid to keep your urine pale yellow.  Breathe slowly and deeply when you feel nauseous.  Avoid smelling things that have strong odors.  Wash your hands often using soap and water. If soap and water are not available, use hand sanitizer.  Make sure that all people in your household wash their hands well and often.  Keep all follow-up visits as told by your health care provider. This is important. Contact a health care provider if:  Your nausea gets worse.  Your nausea does not go away after two days.  You vomit.  You cannot drink fluids without vomiting.  You have any of the following: ? New symptoms. ? A fever. ? A headache. ? Muscle  cramps. ? A rash. ? Pain while urinating.  You feel light-headed or dizzy. Get help right away if:  You have pain in your chest, neck, arm, or jaw.  You feel extremely weak or you faint.  You have vomit that is bright red or looks like coffee grounds.  You have bloody or black stools or stools that look like tar.  You have a severe headache, a stiff neck, or both.  You have severe pain, cramping, or bloating in your abdomen.  You have difficulty breathing or are breathing very quickly.  Your heart is  beating very quickly.  Your skin feels cold and clammy.  You feel confused.  You have signs of dehydration, such as: ? Dark urine, very little urine, or no urine. ? Cracked lips. ? Dry mouth. ? Sunken eyes. ? Sleepiness. ? Weakness. These symptoms may represent a serious problem that is an emergency. Do not wait to see if the symptoms will go away. Get medical help right away. Call your local emergency services (911 in the [place] Do not drive yourself to the hospital. Summary  Nausea is the feeling that you have an upset stomach or that you are about to vomit. Nausea on its own is not usually a serious concern, but it may be an early sign of a more serious medical problem.  If vomiting develops, or if you are not able to drink enough fluids, you are at risk of becoming dehydrated.  Follow recommendations for eating and drinking and take over-the-counter and prescription medicines only as told by your health care provider.  Contact a health care provider right away if your symptoms worsen or you have new symptoms.  Keep all follow-up visits as told by your health care provider. This is important. This information is not intended to replace advice given to you by your health care provider. Make sure you discuss any questions you have with your health care provider. Document Released: 11/29/2004 Document Revised: 04/01/2018 Document Reviewed: 04/01/2018 Elsevier Patient Education  [employer].  Vomiting, Adult Vomiting occurs when stomach contents are thrown up and out of the mouth. Many people notice nausea before vomiting. Vomiting can make you feel weak and cause you to become dehydrated. Dehydration can make you feel tired and thirsty, cause you to have a dry mouth, and decrease how often you urinate. Older adults and people who have other diseases or a weak body defense system (immune system) are at higher risk for dehydration. It is important to treat vomiting as told by your  health care provider. Follow these instructions at home:  Eating and drinking     Follow these recommendations as told by your health care provider:  Take an oral rehydration solution (ORS). This is a drink that is sold at pharmacies and retail stores.  Eat bland, easy-to-digest foods in small amounts as you are able. These foods include bananas, applesauce, rice, lean meats, toast, and crackers.  Drink clear fluids slowly and in small amounts as you are able. Clear fluids include water, ice chips, low-calorie sports drinks, and fruit juice that has water added (diluted fruit juice).  Avoid drinking fluids that contain a lot of sugar or caffeine, such as energy drinks, sports drinks, and soda.  Avoid alcohol.  Avoid spicy or fatty foods.  General instructions  Wash your hands often using soap and water. If soap and water are not available, use hand sanitizer. Make sure that everyone in your household washes their hands frequently.  Take over-the-counter and prescription medicines only as told by your health care provider.  Rest at home while you recover.  Watch your condition for any changes.  Keep all follow-up visits as told by your health care provider. This is important. Contact a health care provider if:  Your vomiting gets worse.  You have new symptoms.  You have a fever.  You cannot drink fluids without vomiting.  You feel light-headed or dizzy.  You have a headache.  You have muscle cramps.  You have a rash.  You have pain while urinating. Get help right away if:  You have pain in your chest, neck, arm, or jaw.  You feel extremely weak or you faint.  You have persistent vomiting.  You have vomit that is bright red or looks like black coffee grounds.  You have stools that are bloody or black, or stools that look like tar.  You have a severe headache, a stiff neck, or both.  You have severe pain, cramping, or bloating in your abdomen.  You have  trouble breathing or you are breathing very quickly.  Your heart is beating very quickly.  Your skin feels cold and clammy.  You feel confused.  You have signs of dehydration, such as: ? Dark urine, very little urine, or no urine. ? Cracked lips. ? Dry mouth. ? Sunken eyes. ? Sleepiness. ? Weakness. These symptoms may represent a serious problem that is an emergency. Do not wait to see if the symptoms will go away. Get medical help right away. Call your local emergency services (911 in the [place] Do not drive yourself to the hospital. Summary  Vomiting occurs when stomach contents are thrown up and out of the mouth. Vomiting can cause you to become dehydrated. Older adults and people who have other diseases or a weak immune system are at higher risk for dehydration.  It is important to treat vomiting as told by your health care provider. Follow your health care provider's instructions about eating and drinking.  Wash your hands often using soap and water. If soap and water are not available, use hand sanitizer. Make sure that everyone in your household washes their hands frequently.  Watch your condition for any changes and for signs of dehydration.  Keep all follow-up visits as told by your health care provider. This is important. This information is not intended to replace advice given to you by your health care provider. Make sure you discuss any questions you have with your health care provider. Document Released: 11/18/2015 Document Revised: 04/01/2018 Document Reviewed: 04/01/2018 Elsevier Patient Education  2020 [employer].

## 2019-05-11 NOTE — Progress Notes (Deleted)
Pants not fitting t  S/p hysterectomy  - spotting  Threw up this morning. Medication makes pt feel jittery, but has been taking it everyday -- but hasn't vomitted all weekend.   Weight loss - a piece of pork chop yesterday. Today, a piece of chicken from the grill.   SOB  UPT negative on 05/07/19  TSH

## 2019-05-11 NOTE — Progress Notes (Signed)
Established Patient - Clinic Visit Subjective  Subjective  Patient ID: MRN [id number]  Date of birth: November 09, 1975   PCP: Lyndee Hensen, MD Name: Allison Leonard. Dreese, 43 y.o. female  CC: ED Follow up Nausea and vomiting   HPI This is a 43 year old female with pmhx s/f chronic cyclical vomiting who presents for an ED follow up for vomiting over several days last week. She reports that she could not keep anything down for 4 to 5 days.  She went to the ED twice.  On her second visit, she received fluids. Was prescribed ativan in the ED due to long QT on EKG and pt reports that it makes her jittery, but she hasn't vomitted since using it, with th exception of this morning.   She has had admissions in the past for dehydration and poor p.o. intake.  She reports that he otherwise has appetite for when this nausea happens. She reports that she is not smoking marijuana any longer, though it did help with her nausea in the past.  Per chart review, patient has been seen by several GI doctors with extensive work-up, though today, patient reports never having seen a GI doctor in [place] and asking for referral.   She was told not to take certain antinausea medications due to EKG finding. She has had an acute weight loss with this last episode and reports that her pants are not fitting as usual. UPT negative on 05/07/19. Denies any food intolerance.   Spotting  Patient also reports that she has had some recent vaginal spotting. She reports that this has never happened before and she has had a hysterectomy but doesn't know if she has a cervix. The spotting is not consistent with menstrual cycles. She denies sexual activity x 2 years. No abnormal discharge, dysuria.   ROS: See HPI  HISTORY Meds  Allergies: Reviewed as appropriate  Current Outpatient Medications  Medication Instructions  . aspirin-acetaminophen-caffeine (EXCEDRIN MIGRAINE) 250-250-65 MG tablet 1 tablet, Oral, Every 4 hours PRN  .  dicyclomine (BENTYL) 20 mg, Oral, 3 times daily before meals & bedtime  . famotidine (PEPCID) 20 mg, Oral, 2 times daily  . LORazepam (ATIVAN) 1 mg, Oral, 3 times daily PRN  . metoCLOPramide (REGLAN) 10 mg, Oral, Every 6 hours  . metroNIDAZOLE (FLAGYL) 1,000 mg, Oral, 2 times daily  . prochlorperazine (COMPAZINE) 10 mg, Oral, Every 8 hours PRN  . promethazine (PHENERGAN) 25 mg, Rectal, Every 6 hours PRN   Social Hx: Lilian reports that she has been smoking cigarettes. She has a 3.50 pack-year smoking history. She has never used smokeless tobacco. She reports previous alcohol use. She reports previous drug use. Drug: Marijuana.     Objective   Objective  Physical Exam:  BP 120/80 (BP Location: Right Arm, Patient Position: Sitting, Cuff Size: Small)   Pulse (!) 138   Resp (!) 34   Wt 93 lb 12.8 oz (42.5 kg)   SpO2 99%   BMI 16.62 kg/m  General: NAD, non-toxic, african american woman, thin, sitting comfortably in chair   HEENT: [place]/AT. PERRLA. EOMI.  Cardiovascular: tachycardic, normal S1, S2. B/L 2+ RP. no BLEE Respiratory: CTAB. No IWOB.  Abdomen: + BS. NT, ND, soft to palpation.  Extremities: Warm and well perfused. Moving spontaneously.  Integumentary: No obvious rashes, lesions, trauma on general exam. Neuro: A & O x4. CN grossly intact. No FND GU: External vulva and vagina nonerythematous, without any obvious lesions or rash.  no discharge appreciated. No cervix  appreciated on speculum or bimanual exam.  There is no cervical motion tenderness, masses or gross abnormalities appreciated during bimanual exam.    Pertinent Labs & Imaging:  CBC: Recent Labs  Lab 05/07/19 1001 05/11/19 1704  WBC 10.8* 11.9*  NEUTROABS  --  6.5  HGB 15.9* 14.5  HCT 52.8* 42.7  MCV 85.9 82  PLT 314 227   CMP: Recent Labs  Lab 05/07/19 1001 05/07/19 1056 05/11/19 1704  NA  --  134* 134  K  --  4.4 4.4  CL  --  107 94*  CO2  --  12* 19*  GLUCOSE  --  102* 102*  BUN  --  13 25*   CREATININE  --  0.62 0.64  CALCIUM  --  8.8* 10.6*  MG 2.1 1.8  --   ALBUMIN  --  4.7 5.1*     Liver Function Tests: Recent Labs  Lab 05/07/19 1056 05/11/19 1704  AST 19 23  ALT 14 9  ALKPHOS 70 79  BILITOT 0.9 0.4  PROT 8.5* 8.1  ALBUMIN 4.7 5.1*   Recent Labs  Lab 05/07/19 1056  LIPASE 26   Thyroid Function Tests: Recent Labs    05/11/19 1704  TSH 1.010     Assessment  Assessment & Plan  Vaginal spotting Patient reported recent onset of vaginal spotting that was very light, however concerning is patient is status post hysterectomy.  Vaginal cytology collected and negative for any intraepithelial lesions.  Trichomonas positive.  Patient reported no sexual encounters for 2 years.  Patient agreeable obtaining HIV testing today.  Trichimoniasis Most likely cause of patient's vaginal spotting.  Given patient's vomiting will attempt with 2 g single dose treatment and encourage patient to take food with medication to avoid nausea.  As patient's vomiting waxes and wanes, I am unsure that a 7-day course would be possible for this patient.  If patient fails 2 g single dose, can consider 1g twice daily x1 day.  Patient underweight Patient denies any food tolerance issues and is unsure why she has consistent nausea vomiting.  Patient has previously seen nutrition in the past and reports that it did not work for her, as she does continue to eat the foods she knows that make her sick.  Patient reports cutting back on these foods and continues to work on this. In addition to nausea vomiting, can also consider other causes for weight loss including HIV and malignancy.  Vaginal cytology returned negative.  Patient will come in for HIV labs.   Volume depletion Patient continues to drink free water at home.  Encourage patient to take in Gatorade or Pedialyte.  If she is to feel patient given return precautions to go to ED.  Cannabinoid hyperemesis syndrome ([place]) This is most likely  cause of patient's intractable vomiting and nausea.  Patient reports that she has cut back a lot from previously.  We will continue to encourage patient to avoid THC use.  Patient aware that she will have to go to the ED for symptoms during episodes of intractable vomiting for IV fluid and labs.  Nausea vomiting can also be exacerbated by foods that patient eats.  She reports that she is used to a way of eating and hard time staying away from the foods that she should not have.  Encourage patient to use a food diary.  She has already seen nutrition in the past and declines another referral. Patient also followed with GI.  In February 2020,  patient encouraged to follow-up for further work-up.  Given the broad differential diagnosis for nausea vomiting, the following have been considered.  Infectious: No fevers, chills. no diarrhea.  Less likely given the episodes  GI: Extensive work-up has been done including endoscopies, ruling out PUD.  H. pylori previously ruled out.  Can consider functional dyspepsia for structural issues such as SMA syndrome.  Psych: Consider bulimia or anorexia.  Factitious disorder.    UPT negative  Ingestion: Accidental exposure or ingestion of toxins, alcohol     Wilber Oliphant, M.D.  PGY-2  Family Medicine  05/14/2019 8:54 AM

## 2019-05-12 ENCOUNTER — Telehealth: Payer: Self-pay | Admitting: *Deleted

## 2019-05-12 ENCOUNTER — Encounter: Payer: Self-pay | Admitting: Family Medicine

## 2019-05-12 LAB — COMPREHENSIVE METABOLIC PANEL
ALT: 9 IU/L (ref 0–32)
AST: 23 IU/L (ref 0–40)
Albumin/Globulin Ratio: 1.7 (ref 1.2–2.2)
Albumin: 5.1 g/dL — ABNORMAL HIGH (ref 3.8–4.8)
Alkaline Phosphatase: 79 IU/L (ref 39–117)
BUN/Creatinine Ratio: 39 — ABNORMAL HIGH (ref 9–23)
BUN: 25 mg/dL — ABNORMAL HIGH (ref 6–24)
Bilirubin Total: 0.4 mg/dL (ref 0.0–1.2)
CO2: 19 mmol/L — ABNORMAL LOW (ref 20–29)
Calcium: 10.6 mg/dL — ABNORMAL HIGH (ref 8.7–10.2)
Chloride: 94 mmol/L — ABNORMAL LOW (ref 96–106)
Creatinine, Ser: 0.64 mg/dL (ref 0.57–1.00)
GFR calc Af Amer: 127 mL/min/{1.73_m2} (ref >59)
GFR calc non Af Amer: 110 mL/min/{1.73_m2} (ref >59)
Globulin, Total: 3 g/dL (ref 1.5–4.5)
Glucose: 102 mg/dL — ABNORMAL HIGH (ref 65–99)
Potassium: 4.4 mmol/L (ref 3.5–5.2)
Sodium: 134 mmol/L (ref 134–144)
Total Protein: 8.1 g/dL (ref 6.0–8.5)

## 2019-05-12 LAB — CBC WITH DIFFERENTIAL/PLATELET
Basophils Absolute: 0.1 10*3/uL (ref 0.0–0.2)
Basos: 1 %
EOS (ABSOLUTE): 0.2 10*3/uL (ref 0.0–0.4)
Eos: 2 %
Hematocrit: 42.7 % (ref 34.0–46.6)
Hemoglobin: 14.5 g/dL (ref 11.1–15.9)
Immature Grans (Abs): 0 10*3/uL (ref 0.0–0.1)
Immature Granulocytes: 0 %
Lymphocytes Absolute: 4.1 10*3/uL — ABNORMAL HIGH (ref 0.7–3.1)
Lymphs: 35 %
MCH: 27.7 pg (ref 26.6–33.0)
MCHC: 34 g/dL (ref 31.5–35.7)
MCV: 82 fL (ref 79–97)
Monocytes Absolute: 1 10*3/uL — ABNORMAL HIGH (ref 0.1–0.9)
Monocytes: 8 %
Neutrophils Absolute: 6.5 10*3/uL (ref 1.4–7.0)
Neutrophils: 54 %
Platelets: 227 10*3/uL (ref 150–450)
RBC: 5.24 x10E6/uL (ref 3.77–5.28)
RDW: 13 % (ref 11.7–15.4)
WBC: 11.9 10*3/uL — ABNORMAL HIGH (ref 3.4–10.8)

## 2019-05-12 LAB — TSH: TSH: 1.01 u[IU]/mL (ref 0.450–4.500)

## 2019-05-12 MED ORDER — PROMETHAZINE HCL 25 MG RE SUPP: 25.0000 mg | Freq: Four times a day (QID) | RECTAL | 0 refills | Status: DC | PRN

## 2019-05-12 MED ORDER — METOCLOPRAMIDE HCL 10 MG PO TABS: 10.0000 mg | ORAL_TABLET | Freq: Four times a day (QID) | ORAL | 0 refills | Status: DC

## 2019-05-12 NOTE — Telephone Encounter (Signed)
Pt states that MD was going to call in reglan and phenergan for her yesterday but they have not been received yet.  Will forward to Dr. Maudie Mercury. Christen Bame, CMA

## 2019-05-13 LAB — CERVICOVAGINAL ANCILLARY ONLY
Chlamydia: NEGATIVE
Neisseria Gonorrhea: NEGATIVE

## 2019-05-13 LAB — CYTOLOGY - PAP
Diagnosis: NEGATIVE
HPV: NOT DETECTED

## 2019-05-14 ENCOUNTER — Encounter: Payer: Self-pay | Admitting: Family Medicine

## 2019-05-14 DIAGNOSIS — R636 Underweight: Secondary | ICD-10-CM | POA: Insufficient documentation

## 2019-05-14 DIAGNOSIS — N939 Abnormal uterine and vaginal bleeding, unspecified: Secondary | ICD-10-CM | POA: Insufficient documentation

## 2019-05-14 DIAGNOSIS — A599 Trichomoniasis, unspecified: Secondary | ICD-10-CM | POA: Insufficient documentation

## 2019-05-14 MED ORDER — METRONIDAZOLE 500 MG PO TABS: 2000.0000 mg | ORAL_TABLET | Freq: Once | ORAL | 0 refills | Status: DC

## 2019-05-14 MED ORDER — METRONIDAZOLE 500 MG PO TABS: 1000.0000 mg | ORAL_TABLET | Freq: Two times a day (BID) | ORAL | 0 refills | Status: AC

## 2019-05-14 NOTE — Assessment & Plan Note (Addendum)
Patient reported recent onset of vaginal spotting that was very light, however concerning is patient is status post hysterectomy.  Vaginal cytology collected and negative for any intraepithelial lesions.  Trichomonas positive.  Patient reported no sexual encounters for 2 years.  Patient agreeable obtaining HIV testing today.

## 2019-05-14 NOTE — Assessment & Plan Note (Signed)
This is most likely cause of patient's intractable vomiting and nausea.  Patient reports that she has cut back a lot from previously.  We will continue to encourage patient to avoid THC use.  Patient aware that she will have to go to the ED for symptoms during episodes of intractable vomiting for IV fluid and labs.  Nausea vomiting can also be exacerbated by foods that patient eats.  She reports that she is used to a way of eating and hard time staying away from the foods that she should not have.  Encourage patient to use a food diary.  She has already seen nutrition in the past and declines another referral. Patient also followed with GI.  In February 2020, patient encouraged to follow-up for further work-up.  Given the broad differential diagnosis for nausea vomiting, the following have been considered.  Infectious: No fevers, chills. no diarrhea.  Less likely given the episodes  GI: Extensive work-up has been done including endoscopies, ruling out PUD.  H. pylori previously ruled out.  Can consider functional dyspepsia for structural issues such as SMA syndrome.  Psych: Consider bulimia or anorexia.  Factitious disorder.    UPT negative  Ingestion: Accidental exposure or ingestion of toxins, alcohol

## 2019-05-14 NOTE — Assessment & Plan Note (Signed)
Patient continues to drink free water at home.  Encourage patient to take in Gatorade or Pedialyte.  If she is to feel patient given return precautions to go to ED.

## 2019-05-14 NOTE — Assessment & Plan Note (Signed)
Most likely cause of patient's vaginal spotting.  Given patient's vomiting will attempt with 2 g single dose treatment and encourage patient to take food with medication to avoid nausea.  As patient's vomiting waxes and wanes, I am unsure that a 7-day course would be possible for this patient.  If patient fails 2 g single dose, can consider 1g twice daily x1 day.

## 2019-05-14 NOTE — Assessment & Plan Note (Addendum)
Patient denies any food tolerance issues and is unsure why she has consistent nausea vomiting.  Patient has previously seen nutrition in the past and reports that it did not work for her, as she does continue to eat the foods she knows that make her sick.  Patient reports cutting back on these foods and continues to work on this. In addition to nausea vomiting, can also consider other causes for weight loss including HIV and malignancy.  Vaginal cytology returned negative.  Patient will come in for HIV labs.

## 2019-05-27 ENCOUNTER — Other Ambulatory Visit: Payer: Medicaid Other

## 2019-05-28 ENCOUNTER — Other Ambulatory Visit: Payer: Self-pay

## 2019-05-28 ENCOUNTER — Other Ambulatory Visit: Payer: Medicaid Other

## 2019-05-28 DIAGNOSIS — R634 Abnormal weight loss: Secondary | ICD-10-CM | POA: Diagnosis not present

## 2019-05-29 LAB — HIV ANTIBODY (ROUTINE TESTING W REFLEX): HIV Screen 4th Generation wRfx: NONREACTIVE

## 2019-05-31 ENCOUNTER — Encounter: Payer: Self-pay | Admitting: Family Medicine

## 2019-06-08 ENCOUNTER — Other Ambulatory Visit: Payer: Self-pay | Admitting: Gastroenterology

## 2019-06-08 ENCOUNTER — Other Ambulatory Visit: Payer: Self-pay | Admitting: Family Medicine

## 2019-07-15 ENCOUNTER — Encounter: Payer: Self-pay | Admitting: Family Medicine

## 2019-09-04 ENCOUNTER — Other Ambulatory Visit: Payer: Self-pay | Admitting: Family Medicine

## 2019-09-11 ENCOUNTER — Emergency Department (HOSPITAL_COMMUNITY)
Admission: EM | Admit: 2019-09-11 | Discharge: 2019-09-11 | Disposition: A | Discharge status: Home / Self Care | Payer: Medicaid Other | Attending: Emergency Medicine | Admitting: Emergency Medicine

## 2019-09-11 ENCOUNTER — Other Ambulatory Visit: Payer: Self-pay

## 2019-09-11 DIAGNOSIS — Z79899 Other long term (current) drug therapy: Secondary | ICD-10-CM | POA: Diagnosis not present

## 2019-09-11 DIAGNOSIS — R1084 Generalized abdominal pain: Secondary | ICD-10-CM | POA: Diagnosis not present

## 2019-09-11 DIAGNOSIS — R112 Nausea with vomiting, unspecified: Secondary | ICD-10-CM | POA: Diagnosis not present

## 2019-09-11 DIAGNOSIS — R1013 Epigastric pain: Secondary | ICD-10-CM | POA: Diagnosis present

## 2019-09-11 DIAGNOSIS — T407X1A Poisoning by cannabis (derivatives), accidental (unintentional), initial encounter: Secondary | ICD-10-CM | POA: Insufficient documentation

## 2019-09-11 DIAGNOSIS — F17210 Nicotine dependence, cigarettes, uncomplicated: Secondary | ICD-10-CM | POA: Diagnosis not present

## 2019-09-11 DIAGNOSIS — E860 Dehydration: Secondary | ICD-10-CM | POA: Diagnosis not present

## 2019-09-11 LAB — CBC WITH DIFFERENTIAL/PLATELET
Abs Immature Granulocytes: 0.03 10*3/uL (ref 0.00–0.07)
Basophils Absolute: 0 10*3/uL (ref 0.0–0.1)
Basophils Relative: 0 %
Eosinophils Absolute: 0.1 10*3/uL (ref 0.0–0.5)
Eosinophils Relative: 1 %
HCT: 42.2 % (ref 36.0–46.0)
Hemoglobin: 13.2 g/dL (ref 12.0–15.0)
Immature Granulocytes: 0 %
Lymphocytes Relative: 19 %
Lymphs Abs: 1.8 10*3/uL (ref 0.7–4.0)
MCH: 27.2 pg (ref 26.0–34.0)
MCHC: 31.3 g/dL (ref 30.0–36.0)
MCV: 86.8 fL (ref 80.0–100.0)
Monocytes Absolute: 0.4 10*3/uL (ref 0.1–1.0)
Monocytes Relative: 4 %
Neutro Abs: 7.2 10*3/uL (ref 1.7–7.7)
Neutrophils Relative %: 76 %
Platelets: 228 10*3/uL (ref 150–400)
RBC: 4.86 MIL/uL (ref 3.87–5.11)
RDW: 13.8 % (ref 11.5–15.5)
WBC: 9.4 10*3/uL (ref 4.0–10.5)
nRBC: 0 % (ref 0.0–0.2)

## 2019-09-11 LAB — RAPID URINE DRUG SCREEN, HOSP PERFORMED
Amphetamines: NOT DETECTED
Barbiturates: NOT DETECTED
Benzodiazepines: NOT DETECTED
Cocaine: NOT DETECTED
Opiates: NOT DETECTED
Tetrahydrocannabinol: POSITIVE — AB

## 2019-09-11 LAB — I-STAT CHEM 8, ED
BUN: 18 mg/dL (ref 6–20)
Calcium, Ion: 1.16 mmol/L (ref 1.15–1.40)
Chloride: 104 mmol/L (ref 98–111)
Creatinine, Ser: 0.4 mg/dL — ABNORMAL LOW (ref 0.44–1.00)
Glucose, Bld: 89 mg/dL (ref 70–99)
HCT: 44 % (ref 36.0–46.0)
Hemoglobin: 15 g/dL (ref 12.0–15.0)
Potassium: 4.7 mmol/L (ref 3.5–5.1)
Sodium: 139 mmol/L (ref 135–145)
TCO2: 26 mmol/L (ref 22–32)

## 2019-09-11 LAB — I-STAT BETA HCG BLOOD, ED (MC, WL, AP ONLY): I-stat hCG, quantitative: <5 m[IU]/mL (ref <5)

## 2019-09-11 MED ORDER — HALOPERIDOL LACTATE 5 MG/ML IJ SOLN
5.0000 mg | Freq: Once | INTRAMUSCULAR | Status: AC
  Administered 2019-09-11: 5 mg via INTRAVENOUS
  Filled 2019-09-11: qty 1

## 2019-09-11 MED ORDER — SODIUM CHLORIDE 0.9 % IV BOLUS
1000.0000 mL | Freq: Once | INTRAVENOUS | Status: AC
  Administered 2019-09-11: 1000 mL via INTRAVENOUS

## 2019-09-11 MED ORDER — METOCLOPRAMIDE HCL 10 MG PO TABS: 10.0000 mg | ORAL_TABLET | Freq: Three times a day (TID) | ORAL | 0 refills | Status: DC | PRN

## 2019-09-11 NOTE — ED Notes (Signed)
Pt provided water, per her request.

## 2019-09-11 NOTE — ED Provider Notes (Signed)
[facility] DEPT Provider Note   CSN: [id number] Arrival date & time: 09/11/19  0542     History   Chief Complaint Chief Complaint  Patient presents with  . Abdominal Pain    HPI Allison Leonard is a 43 y.o. female.     The history is provided by the patient and medical records. No language interpreter was used.  Abdominal Pain    43 year old female with history of recently diagnosed cyclic vomiting syndrome, general anxiety disorder, GERD, celiac sprue presenting for evaluation of abdominal pain.  Patient report for the past 4 days she has had persistent epigastric abdominal pain she described as a crampy achy sensation, 7 out of 10, with associate nausea vomiting and diarrhea.  States she vomits about 5-6 episodes of nonbloody nonbilious content daily and now she does not have much to bring it up.  She denies having fever or chills no chest pain shortness of breath cough no dysuria.  She tries taking her home medication without relief.  She report last marijuana use was a week ago.  She denies alcohol abuse.  She has had similar symptoms like this in the past.  She denies any Covid symptoms.  Past Medical History:  Diagnosis Date  . Acid reflux 07/04/2018  . Anemia   . Celiac disease/sprue   . Cyclic vomiting syndrome   . Generalized anxiety disorder 07/04/2018  . History of colon polyps    last colonoscopy 2018 removed multiple benign polyps  . Migraines 07/04/2018    Patient Active Problem List   Diagnosis Date Noted  . Vaginal spotting 05/14/2019  . Trichimoniasis 05/14/2019  . Patient underweight 05/14/2019  . Metabolic acidosis 85/88/5027  . Volume depletion 11/19/2018  . Cannabis abuse 11/19/2018  . Nausea and vomiting 11/19/2018  . Cannabinoid hyperemesis syndrome 10/27/2018  . Stress 07/04/2018  . Acid reflux 07/04/2018  . Migraines 07/04/2018    Past Surgical History:  Procedure Laterality Date  . ABDOMINAL  HYSTERECTOMY  2006  . APPENDECTOMY  1999  . COLONOSCOPY  2018  . ESOPHAGOGASTRODUODENOSCOPY  2018     OB History   No obstetric history on file.      Home Medications    Prior to Admission medications   Medication Sig Start Date End Date Taking? Authorizing Provider  aspirin-acetaminophen-caffeine (EXCEDRIN MIGRAINE) [phone] MG tablet Take 1 tablet by mouth every 4 (four) hours as needed for headache.    [provider]  dicyclomine (BENTYL) 20 MG tablet TAKE 1 TABLET (20 MG TOTAL) BY MOUTH 4 (FOUR) TIMES DAILY - BEFORE MEALS AND AT BEDTIME. 06/08/19   Thornton Park, MD  famotidine (PEPCID) 20 MG tablet Take 1 tablet (20 mg total) by mouth 2 (two) times daily. 12/18/18   Thornton Park, MD  LORazepam (ATIVAN) 0.5 MG tablet Take 2 tablets (1 mg total) by mouth 3 (three) times daily as needed (nausea and vomiting). 05/07/19   Tegeler, Gwenyth Allegra, MD  metoCLOPramide (REGLAN) 10 MG tablet TAKE 1 TABLET (10 MG TOTAL) BY MOUTH EVERY 6 (SIX) HOURS. 09/08/19   Lyndee Hensen, MD  prochlorperazine (COMPAZINE) 10 MG tablet Take 1 tablet (10 mg total) by mouth every 8 (eight) hours as needed for nausea or vomiting. Patient not taking: Reported on 05/07/2019 02/20/19   Duffy Bruce, MD  promethazine (PHENERGAN) 25 MG suppository Place 1 suppository (25 mg total) rectally every 6 (six) hours as needed for refractory nausea / vomiting. 05/12/19   Wilber Oliphant, MD  Family History Family History  Problem Relation Age of Onset  . Diabetes Father   . Hypertension Father   . Hyperlipidemia Father   . Colon polyps Father   . Heart attack Maternal Grandmother   . Heart attack Paternal Grandmother   . Cervical cancer Maternal Aunt   . Throat cancer Maternal Uncle   . Stroke Maternal Uncle   . Down syndrome Son   . Colon cancer Neg Hx     Social History Social History   Tobacco Use  . Smoking status: Current Some Day Smoker    Packs/day: 0.50    Years: 7.00    Pack years:  3.50    Types: Cigarettes  . Smokeless tobacco: Never Used  Substance Use Topics  . Alcohol use: Not Currently  . Drug use: Not Currently    Types: Marijuana    Comment: quit a month ago per patient     Allergies   Zofran [ondansetron hcl] and Morphine and related   Review of Systems Review of Systems  Gastrointestinal: Positive for abdominal pain.  All other systems reviewed and are negative.    Physical Exam Updated Vital Signs BP 124/85 (BP Location: Right Arm)   Pulse 89   Temp 98.2 F (36.8 C) (Oral)   Resp 20   Ht 5' 4"  (1.626 m)   Wt 44.5 kg   SpO2 95%   BMI 16.82 kg/m   Physical Exam Vitals signs and nursing note reviewed.  Constitutional:      General: She is not in acute distress.    Appearance: She is well-developed.  HENT:     Head: Atraumatic.  Eyes:     Conjunctiva/sclera: Conjunctivae normal.  Neck:     Musculoskeletal: Neck supple.  Cardiovascular:     Rate and Rhythm: Normal rate and regular rhythm.  Pulmonary:     Effort: Pulmonary effort is normal.     Breath sounds: Normal breath sounds.  Abdominal:     General: Abdomen is flat.     Palpations: Abdomen is soft.     Tenderness: There is abdominal tenderness in the epigastric area.  Skin:    Findings: No rash.  Neurological:     Mental Status: She is alert and oriented to person, place, and time.  Psychiatric:        Mood and Affect: Mood normal.      ED Treatments / Results  Labs (all labs ordered are listed, but only abnormal results are displayed) Labs Reviewed  RAPID URINE DRUG SCREEN, HOSP PERFORMED - Abnormal; Notable for the following components:      Result Value   Tetrahydrocannabinol POSITIVE (*)    All other components within normal limits  I-STAT CHEM 8, ED - Abnormal; Notable for the following components:   Creatinine, Ser 0.40 (*)    All other components within normal limits  CBC WITH DIFFERENTIAL/PLATELET  I-STAT BETA HCG BLOOD, ED ([facility], [facility], [facility] ONLY)     EKG EKG Interpretation  Date/Time:  Friday September 11 2019 06:14:29 EST Ventricular Rate:  82 PR Interval:    QRS Duration: 92 QT Interval:  375 QTC Calculation: 438 R Axis:   88 Text Interpretation: Sinus rhythm Probable left atrial enlargement Left ventricular hypertrophy Confirmed by Randal Buba, April ([id number]) on 09/11/2019 6:22:13 AM   Radiology No results found.  Procedures Procedures (including critical care time)  Medications Ordered in ED Medications  sodium chloride 0.9 % bolus 1,000 mL (0 mLs Intravenous Stopped 09/11/19 0939)  haloperidol  lactate (HALDOL) injection 5 mg (5 mg Intravenous Given 09/11/19 0727)     Initial Impression / Assessment and Plan / ED Course  I have reviewed the triage vital signs and the nursing notes.  Pertinent labs & imaging results that were available during my care of the patient were reviewed by me and considered in my medical decision making (see chart for details).        BP 124/85 (BP Location: Right Arm)   Pulse 89   Temp 98.2 F (36.8 C) (Oral)   Resp 20   Ht 5' 4"  (1.626 m)   Wt 44.5 kg   SpO2 95%   BMI 16.82 kg/m    Final Clinical Impressions(s) / ED Diagnoses   Final diagnoses:  Cannabinoid hyperemesis syndrome    ED Discharge Orders         Ordered    metoCLOPramide (REGLAN) 10 MG tablet  Every 8 hours PRN     09/11/19 1022         7:08 AM Patient with history of cannabinoid hyperemesis syndrome, cyclic vomiting, here with abdominal discomfort and associate nausea vomiting and diarrhea similar to prior history.  UDS positive for tetrahydrocannabinol aid.  Labs otherwise reassuring.  Will provide symptomatic treatment.  10:18 AM Patient did report improvement of symptoms after receiving Haldol and fluid.  She still endorsed mild nausea but felt stable enough to go home.  Encourage avoid marijuana use.  Recommend outpatient follow-up with GI specialist as needed.  Return precaution given.   Domenic Moras, PA-C  09/11/19 1023    Wyvonnia Dusky, MD 09/12/19 1150

## 2019-09-11 NOTE — ED Triage Notes (Addendum)
Pt comes to ed via home, " c/o is abdominal pain, and states " she was diagnosed with cyclic vomiting syndrome" last month.  Pt says starting Tuesday has not been able to keep things down. Pain is in her upper stomach. Pt had one episode of dry heaving with ems, bp 130/84, pluses 94, spo2 95, cbg 101, temp 97.7. rr 18  Allergies to zofran/  And morphine  Walks to ems, and alert x 4.

## 2019-09-15 NOTE — Progress Notes (Signed)
   Subjective:    Patient ID: Nary S. Whittinghill, female    DOB: 07/31/1976, 43 y.o.   MRN: [id number]   CC: ED f/u  HPI: Canabinoid hyperemesis Seen on 11/6 emergency department for cannabinoid hyperemesis.  At that time UDS is positive for THC.  Other labs are reassuring.  Had improvement with Haldol and fluids.  Was able to tolerate p.o. and felt stable to go home.  Was encouraged to avoid marijuana use.  Recommended to follow-up with GI specialist.  Patient was seen by Dr. Maudie Mercury on 05/14/2019 for the same problem.  At that time was encouraged to use a food diary and refused nutrition referral.  Was encouraged to follow-up with GI.  Patient has had extensive work-up by GI in the past.  Today patient reports continued symptoms. Reports that she has emesis every AM. Reports this has occurred since her hysterectomy since 2006. Uterus was removed 2/2 to a "mass in her uterus". Vomiting has progressively worsened x2 years. States she does not think it is related to her marijuana use as she stopped using marijuana x2 months and vomiting did not improve. Reports seeing GI before pandemic, cannot remember the name of her physician. Has had 2 colonoscopies in the past and 1 endoscopy.    Objective:  BP 100/70   Pulse (!) 137   Wt 94 lb 9.6 oz (42.9 kg)   SpO2 100%   BMI 16.24 kg/m  Vitals and nursing note reviewed  General: well nourished, in no acute distress HEENT: normocephalic, no scleral icterus or conjunctival pallor, no nasal discharge, moist mucous membranes  Neck: supple, non-tender, without lymphadenopathy Cardiac: RRR, clear S1 and S2, no murmurs, rubs, or gallops, manual pulse 120 Respiratory: clear to auscultation bilaterally, no increased work of breathing Abdomen: soft, nontender, nondistended, no masses or organomegaly. Bowel sounds present Extremities: no edema or cyanosis. Warm, well perfused. 2+ radial pulses bilaterally Skin: warm and dry, no rashes noted, normal skin  turgor  Neuro: alert and oriented, no focal deficits   Assessment & Plan:    Cyclic vomiting syndrome Patient with cyclic vomiting. Colonoscopy performed on 04/2017 showing sessile polyp measuring 8 mm in the sigmoid colon that was removed: Otherwise normal. Patient's emesis previously believed to be 2/2 cannabis use, however, patient states this is not the cause. Advised to try capsaicin cream over abdomen (around umbilicus) as this has been shown to have symptomatic improvement. Advised warm showers as well. 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 Advised follow up with GI, patient will call to schedule this. Will switch from H2 blocker to protonix to help with acid reduction. Follow up in 1 month with [facility]. Weight is down 4 lbs 2/2 emesis. HIV negative in July. Vitals likely 2/2 poor hydration. Patient tolerating PO and wants to avoid hospitalization so advised increased hydration at home. Strict return precautions given.     Return in about 4 weeks (around 10/14/2019) for f/u after seeing GI.   Caroline More, DO, PGY-3

## 2019-09-16 ENCOUNTER — Other Ambulatory Visit: Payer: Self-pay

## 2019-09-16 ENCOUNTER — Encounter: Payer: Self-pay | Admitting: Family Medicine

## 2019-09-16 ENCOUNTER — Ambulatory Visit (INDEPENDENT_AMBULATORY_CARE_PROVIDER_SITE_OTHER): Payer: Medicaid Other | Admitting: Family Medicine

## 2019-09-16 DIAGNOSIS — R1115 Cyclical vomiting syndrome unrelated to migraine: Secondary | ICD-10-CM

## 2019-09-16 MED ORDER — PANTOPRAZOLE SODIUM 20 MG PO TBEC: 20.0000 mg | DELAYED_RELEASE_TABLET | Freq: Every day | ORAL | 1 refills | Status: DC

## 2019-09-16 MED ORDER — METOCLOPRAMIDE HCL 10 MG PO TABS: 10.0000 mg | ORAL_TABLET | Freq: Three times a day (TID) | ORAL | 0 refills | Status: DC | PRN

## 2019-09-16 NOTE — Patient Instructions (Addendum)
Cyclic Vomiting Syndrome, Adult Cyclic vomiting syndrome (CVS) is a condition that causes episodes of severe nausea and vomiting. It can last for hours or even days. Attacks may occur several times a month or several times a year. Between episodes of CVS, you may be otherwise healthy. CVS is also called abdominal migraine. What are the causes? The cause of this condition is not known. Although many of the episodes can happen for no obvious reason, you may have specific CVS triggers. Episodes may be triggered by:  An infection, especially colds and the flu.  Emotional stress, including excitement or anxiety about upcoming events, such as school, parties, or travel.  Certain foods or beverages, such as chocolate, cheese, alcohol, and food additives.  Motion sickness.  Eating a large meal before bed.  Being very tired.  Being too hot. What increases the risk? You are more likely to develop this condition if:  You get migraine headaches.  You have a family history of CVS or migraine headaches. What are the signs or symptoms? Symptoms tend to happen at the same time of day, and each episode tends to last about the same amount of time. Symptoms commonly start at night or when you wake up. Many people have warning signs (prodrome) before an episode, which may include slight nausea, sweating, and pale skin (pallor). The most common symptoms of a CVS attack include:  Severe vomiting. Vomiting may happen every 5-15 minutes.  Severe nausea.  Gagging (retching). Other symptoms may include:  Headache.  Dizziness.  Sensitivity to light.  Extreme thirst.  Abdominal pain. This can be severe.  Loose stools or diarrhea.  Fever.  Pale skin (pallor), especially on the face.  Weakness.  Exhaustion.  Sleepiness after a CVS episode.  Dehydration. This can cause: ? Thirst. ? Dry mouth. ? Decreased urination. ? Fatigue. How is this diagnosed? This condition may be diagnosed  based on your symptoms, medical history, and family history of CVS or migraine. Your health care provider will ask whether you have had:  Episodes of severe nausea and vomiting that have happened a total of 5 or more times, or 3 or more times in the past 6 months.  Episodes that last for 1 hour or more, and occur 1 week apart or farther apart.  Episodes that are similar each time.  Normal health between episodes. Your health care provider will also do a physical exam. To rule out other conditions, you may have tests, such as:  Blood tests.  Urine tests.  Imaging tests. How is this treated? There is no cure for this condition, but treatment can help manage or prevent CVS episodes. Work with your health care provider to find the best treatment for you. Treatment may include:  Avoiding stress and CVS triggers.  Eating smaller, more frequent meals.  Taking medicines, such as: ? Over-the-counter pain medicine. ? Anti-nausea medicines. ? Antacids. ? Antihistamines. ? Medicines for migraines. ? Antidepressants. ? Antibiotics. Severe nausea and vomiting may require you to stay at the hospital. You may need IV fluids to prevent or treat dehydration. Follow these instructions at home: During an episode  Take over-the-counter and prescription medicines only as told by your health care provider.  Stay in bed and rest in a dark, quiet room. After an episode   Drink an oral rehydration solution (ORS), if directed by your health care provider. This is a drink that helps you replace fluids and the salts and minerals in your blood (electrolytes). It can be  found at pharmacies and retail stores.  Drink small amounts of clear fluids slowly and gradually add more. ? Drink clear fluids such as water or fruit juice that has water added (is diluted). You may also eat low-calorie popsicles. ? Avoid drinking fluids that contain a lot of sugar or caffeine, such as sports drinks and soda.  Eat  soft foods in small amounts every 3-4 hours. Eat your regular diet, but avoid spicy or fatty foods, such as french fries and pizza. General instructions  Monitor your condition for any changes.  If you were prescribed an antibiotic medicine, take it as told by your health care provider. Do not stop taking the antibiotic even if you start to feel better.  Keep track of your attacks and symptoms, and pay attention to any triggers. Avoid those triggers when you can.  Keep all follow-up visits as told by your health care provider. This is important. Contact a health care provider if:  Your condition gets worse.  You cannot drink fluids without vomiting.  You have pain and trouble swallowing after an episode. Get help right away if:  You have blood in your vomit.  Your vomit looks like coffee grounds.  You have stools that are bloody or black, or stools that look like tar.  You have signs of dehydration, such as: ? Sunken eyes. ? Not making tears while crying. ? Very dry mouth. ? Cracked lips. ? Decreased urine production. ? Dark urine. Urine may be the color of tea. ? Weakness. ? Sleepiness. Summary  Cyclic vomiting syndrome (CVS) causes episodes of severe nausea and vomiting that can last for hours or even days.  Vomiting and diarrhea can make you feel weak and can lead to dehydration. If you notice signs of dehydration, call your health care provider right away.  Treatment can help you manage or prevent CVS episodes. Work with your health care provider to find the best treatment for you.  Keep all follow-up visits as told by your health care provider. This is important. This information is not intended to replace advice given to you by your health care provider. Make sure you discuss any questions you have with your health care provider. Document Released: 12/07/2016 Document Revised: 02/11/2019 Document Reviewed: 12/07/2016 Elsevier Patient Education  [place].   I would recommend using capsaicin cream.  You can rub this on your belly around your bellybutton daily.  Warm showers can also help.  You need to see your GI doctor as they will be able to come up with a better plan.  I do recommend staying off of marijuana for at minimum of 3 months to see if this helps.  You can also try taking her Reglan at night.  Hopefully can get to Protonix As Well.  Sometimes staying off for a long period of time can do more benefit.  Please follow-up with your PCP Dr. Amedeo Plenty after he see GI.  Dr. Tammi Klippel

## 2019-09-17 DIAGNOSIS — R1115 Cyclical vomiting syndrome unrelated to migraine: Secondary | ICD-10-CM | POA: Insufficient documentation

## 2019-09-17 NOTE — Assessment & Plan Note (Signed)
Patient with cyclic vomiting. Colonoscopy performed on 04/2017 showing sessile polyp measuring 8 mm in the sigmoid colon that was removed: Otherwise normal. Patient's emesis previously believed to be 2/2 cannabis use, however, patient states this is not the cause. Advised to try capsaicin cream over abdomen (around umbilicus) as this has been shown to have symptomatic improvement. Advised warm showers as well. 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 Advised follow up with GI, patient will call to schedule this. Will switch from H2 blocker to protonix to help with acid reduction. Follow up in 1 month with [facility]. Weight is down 4 lbs 2/2 emesis. HIV negative in July. Vitals likely 2/2 poor hydration. Patient tolerating PO and wants to avoid hospitalization so advised increased hydration at home. Strict return precautions given.

## 2019-09-26 NOTE — Progress Notes (Signed)
09/26/2019 Allison Leonard [id number] [birth year]   History of Present Illness: Allison Leonard is a 43 year old female with a past medical history of anxiety, migraine headaches, GERD, cyclic vomiting and colon polyps. Past hysterectomy and appendectomy. She presented to [facility] on 09/11/2019 with epigastric pain, nausea, vomiting and diarrhea. She received Haldol and IV fluids and her symptoms improved. She was prescribed Reglan 62m po Q 8 hours and she was discharged home. Known history of marijuana use. Urine drug screen was + for THC. She has reduced marijuana smoking to once daily. She stated marijuana settles her stomach. Previously advised to stop smoking marijuana with concerns her cyclic nausea and vomiting were due to having cannabinoid hyperemesis. No other drug use. No alcohol use. She smokes cigarettes 1/2 ppd. She takes Dicyclomine 240mqid, Pantoprazole 2013mD, Famotidine 4m42mily and Lorazepam 0.5mg 59m. She has been seen in the ED 5 times within the past year with epigastric pain, nausea, vomiting, diarrhea and headaches. She's undergone an extensive GI evaluation including several abdominal/pelvic CTs in the past as previously reviewed by Dr. BeaveTarri Glenn results below. No specific food triggers. Her bowel pattern varies, she passes a hard, soft or loose stool twice weekly. No rectal bleeding or melena. She went on gluten free diet for several months one year ago without improvement so she resumed a normal diet. Currently, she has mild nausea. No vomiting or diarrhea since her ER visit 11/6. Stress level is high. She has a son age 34 wi56 Down's Syndrome. She has gained 6 lbs. Weighs 100lbs today. Mother with history of gallbladder disease. No family history of upper GI or colorectal cancer. Father had colon polyps. Maternal uncle with throat cancer.   CBC Latest Ref Rng & Units 09/11/2019 09/11/2019 05/11/2019  WBC 4.0 - 10.5 K/uL - 9.4 11.9(H)  Hemoglobin  12.0 - 15.0 g/dL 15.0 13.2 14.5  Hematocrit 36.0 - 46.0 % 44.0 42.2 42.7  Platelets 150 - 400 K/uL - 228 227   CMP Latest Ref Rng & Units 09/11/2019 05/11/2019 05/07/2019  Glucose 70 - 99 mg/dL 89 102(H) 102(H)  BUN 6 - 20 mg/dL 18 25(H) 13  Creatinine 0.44 - 1.00 mg/dL 0.40(L) 0.64 0.62  Sodium 135 - 145 mmol/L 139 134 134(L)  Potassium 3.5 - 5.1 mmol/L 4.7 4.4 4.4  Chloride 98 - 111 mmol/L 104 94(L) 107  CO2 20 - 29 mmol/L - 19(L) 12(L)  Calcium 8.7 - 10.2 mg/dL - 10.6(H) 8.8(L)  Total Protein 6.0 - 8.5 g/dL - 8.1 8.5(H)  Total Bilirubin 0.0 - 1.2 mg/dL - 0.4 0.9  Alkaline Phos 39 - 117 IU/L - 79 70  AST 0 - 40 IU/L - 23 19  ALT 0 - 32 IU/L - 9 14   Lipase     Component Value Date/Time   LIPASE 26 05/07/2019 1056   Celiac Panel 12/18/2018:  tTG IgA 1. IgA 150.   Colonoscopy  in 2015 with Dr. [place]mond [place] adenomatous polyps removed. Colonoscopy 04/16/2017: A sessile hyperplastic polyp was found, measuring 8mm i48mhe sigmoid colon. Colon was otherwise normal.    Surveillance colonoscopy recommended 04/2020   EGD 2015 - EGD 03/28/2014: The esophagus and the gastroesophageal junction were completely normal in appearance.  The duodenal bulb and postbulbar duodenum were normal.  duodenal biopsies were negative for celiac disease  Abdominal/pelvic CT: 06/2013: 3.7 cm x 1.6 cm structure located near the rectum in the inferior pelvis/perineum. Abdominal/pelvic CT  06/2016: cystic lesions were seen in the vaginal wall thought to represent [facility] gland cysts.  The largest measured 4 x 2.1 x 1.9 cm.   Abdominal sonogram 05/07/2019: Gallbladder:No gallstones or wall thickening visualized. No sonographic Murphy sign noted by sonographer.Common bile duct:Diameter: 4 mm Liver:No focal lesion identified. Within normal limits in parenchymal echogenicity. Portal vein is patent on color Doppler imaging with normal direction of blood flow towards the liver.    Current  Outpatient Medications on File Prior to Visit  Medication Sig Dispense Refill  . aspirin-acetaminophen-caffeine (EXCEDRIN MIGRAINE) 250-250-65 MG tablet Take 1 tablet by mouth every 4 (four) hours as needed for headache.    . dicyclomine (BENTYL) 20 MG tablet TAKE 1 TABLET (20 MG TOTAL) BY MOUTH 4 (FOUR) TIMES DAILY - BEFORE MEALS AND AT BEDTIME. 120 tablet 2  . LORazepam (ATIVAN) 0.5 MG tablet Take 2 tablets (1 mg total) by mouth 3 (three) times daily as needed (nausea and vomiting). 15 tablet 0  . metoCLOPramide (REGLAN) 10 MG tablet Take 1 tablet (10 mg total) by mouth every 8 (eight) hours as needed for nausea or vomiting. 30 tablet 0  . pantoprazole (PROTONIX) 20 MG tablet Take 1 tablet (20 mg total) by mouth daily. 30 tablet 1  . [DISCONTINUED] prochlorperazine (COMPAZINE) 10 MG tablet Take 1 tablet (10 mg total) by mouth every 8 (eight) hours as needed for nausea or vomiting. (Patient not taking: Reported on 05/07/2019) 15 tablet 0   No current facility-administered medications on file prior to visit.    Allergies  Allergen Reactions  . Zofran [Ondansetron Hcl] Anaphylaxis  . Morphine And Related Itching   Current Medications, Allergies, Past Medical History, Past Surgical History, Family History and Social History were reviewed in [employer] record.  Physical Exam: BP 120/82   Pulse 76   Temp (!) 97.2 F (36.2 C)   Ht 5' 3"  (1.6 m)   Wt 100 lb 12.8 oz (45.7 kg)   BMI 17.86 kg/m   General: Thin African American 43 year old female in no acute distress Head: Normocephalic and atraumatic Eyes:  sclerae anicteric, conjunctiva pink  Ears: Normal auditory acuity Lungs: Clear throughout to auscultation Heart: Regular rate and rhythm Abdomen: Soft, non tender and non distended. No masses, no hepatomegaly. Normal bowel sounds. Large lower midline scar intact. Rectal: Deferred. Musculoskeletal: Symmetrical with no gross deformities  Extremities: No edema   Neurological: Alert oriented x 4, grossly nonfocal Psychological:  Alert and cooperative. Normal mood and affect Skin: Numerous tattoos.  Assessment and Recommendations:  50. 43 year old female with recurrent N/V, epigastric pain and nonbloody diarrhea. 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 Trail with Amitriptyline -Follow up in the office in 4 to 6 weeks, consider checking 24 hour urine 5 HIAA, C4, C1 esterase inhibitor if symptoms persist   2. History of Marijuana use -encouraged patient to stop smoking marijuana  3. History of tubular adenomatous colon polyps. Family history of colon polyps.  -next colonoscopy due June 2021

## 2019-09-28 ENCOUNTER — Other Ambulatory Visit: Payer: Self-pay

## 2019-09-28 ENCOUNTER — Ambulatory Visit (INDEPENDENT_AMBULATORY_CARE_PROVIDER_SITE_OTHER): Payer: Medicaid Other | Admitting: Nurse Practitioner

## 2019-09-28 ENCOUNTER — Encounter: Payer: Self-pay | Admitting: Nurse Practitioner

## 2019-09-28 VITALS — BP 120/82 | HR 76 | Temp 97.2°F | Ht 63.0 in | Wt 100.8 lb

## 2019-09-28 DIAGNOSIS — R1115 Cyclical vomiting syndrome unrelated to migraine: Secondary | ICD-10-CM | POA: Diagnosis not present

## 2019-09-28 DIAGNOSIS — R1013 Epigastric pain: Secondary | ICD-10-CM

## 2019-09-28 MED ORDER — PROMETHAZINE HCL 12.5 MG PO TABS: 12.5000 mg | ORAL_TABLET | Freq: Two times a day (BID) | ORAL | 0 refills | Status: DC | PRN

## 2019-09-28 NOTE — Patient Instructions (Signed)
If you are age 42 or older, your body mass index should be between 23-30. Your Body mass index is 17.86 kg/m. If this is out of the aforementioned range listed, please consider follow up with your Primary Care Provider.  If you are age 63 or younger, your body mass index should be between 19-25. Your Body mass index is 17.86 kg/m. If this is out of the aformentioned range listed, please consider follow up with your Primary Care Provider.   You have been scheduled for a gastric emptying scan at [facility] on 10/15/2019 at 7:30am. Please arrive at least 15 minutes prior to your appointment for registration. Please make certain not to have anything to eat or drink after midnight the night before your test. Hold all stomach medications (ex: Zofran, phenergan, Reglan) 48 hours prior to your test. If you need to reschedule your appointment, please contact radiology scheduling at [phone]. _____________________________________________________________________ A gastric-emptying study measures how long it takes for food to move through your stomach. There are several ways to measure stomach emptying. In the most common test, you eat food that contains a small amount of radioactive material. A scanner that detects the movement of the radioactive material is placed over your abdomen to monitor the rate at which food leaves your stomach. This test normally takes about 4 hours to complete. __________________________________________________________   We have sent the following medications to your pharmacy for you to pick up at your convenience:  Phenergan 12.95m 1 tablet every 12 hours as needed  Please pick up Riboflavin-B2 [id number]  Thank you for choosing me and [facility]

## 2019-09-29 NOTE — Progress Notes (Signed)
Reviewed and agree with management plans. ? ?Christepher Melchior L. Shantel Helwig, MD, MPH  ?

## 2019-10-15 ENCOUNTER — Telehealth: Payer: Self-pay | Admitting: Nurse Practitioner

## 2019-10-15 ENCOUNTER — Other Ambulatory Visit: Payer: Self-pay

## 2019-10-15 ENCOUNTER — Encounter (HOSPITAL_COMMUNITY): Payer: Self-pay

## 2019-10-15 ENCOUNTER — Encounter (HOSPITAL_COMMUNITY)
Admission: RE | Admit: 2019-10-15 | Discharge: 2019-10-15 | Disposition: A | Discharge status: Home / Self Care | Payer: Medicaid Other | Source: Ambulatory Visit | Attending: Nurse Practitioner | Admitting: Nurse Practitioner

## 2019-10-15 DIAGNOSIS — R1013 Epigastric pain: Secondary | ICD-10-CM | POA: Insufficient documentation

## 2019-10-15 DIAGNOSIS — R1115 Cyclical vomiting syndrome unrelated to migraine: Secondary | ICD-10-CM | POA: Insufficient documentation

## 2019-10-15 NOTE — Telephone Encounter (Signed)
Please review and advise.

## 2019-10-15 NOTE — Telephone Encounter (Signed)
Jan from Nuclear Medicine called to let you know that this patient came in to do the gastro empty study and that she could not complete the test. She did attempt the test with the scrambled egg but could not tolerate it. Jan let patient know that the other option is oatmeal and patient let her know she was not a breakfast person. Jan was not sure if patient would even want to set up another test or not.

## 2019-10-15 NOTE — Telephone Encounter (Signed)
I will discuss further with patient at time of office follow up

## 2019-10-17 ENCOUNTER — Emergency Department (HOSPITAL_COMMUNITY)
Admission: EM | Admit: 2019-10-17 | Discharge: 2019-10-17 | Disposition: A | Discharge status: Home / Self Care | Payer: Medicaid Other | Attending: Emergency Medicine | Admitting: Emergency Medicine

## 2019-10-17 ENCOUNTER — Encounter (HOSPITAL_COMMUNITY): Payer: Self-pay | Admitting: Emergency Medicine

## 2019-10-17 ENCOUNTER — Emergency Department (HOSPITAL_COMMUNITY): Payer: Medicaid Other

## 2019-10-17 ENCOUNTER — Other Ambulatory Visit: Payer: Self-pay

## 2019-10-17 DIAGNOSIS — R1084 Generalized abdominal pain: Secondary | ICD-10-CM | POA: Diagnosis not present

## 2019-10-17 DIAGNOSIS — R112 Nausea with vomiting, unspecified: Secondary | ICD-10-CM | POA: Insufficient documentation

## 2019-10-17 DIAGNOSIS — Z79899 Other long term (current) drug therapy: Secondary | ICD-10-CM | POA: Diagnosis not present

## 2019-10-17 DIAGNOSIS — F17210 Nicotine dependence, cigarettes, uncomplicated: Secondary | ICD-10-CM | POA: Insufficient documentation

## 2019-10-17 DIAGNOSIS — Z7982 Long term (current) use of aspirin: Secondary | ICD-10-CM | POA: Diagnosis not present

## 2019-10-17 DIAGNOSIS — R110 Nausea: Secondary | ICD-10-CM | POA: Diagnosis not present

## 2019-10-17 LAB — URINALYSIS, ROUTINE W REFLEX MICROSCOPIC
Bilirubin Urine: NEGATIVE
Glucose, UA: 50 mg/dL — AB
Ketones, ur: 80 mg/dL — AB
Leukocytes,Ua: NEGATIVE
Nitrite: NEGATIVE
Protein, ur: 100 mg/dL — AB
Specific Gravity, Urine: 1.017 (ref 1.005–1.030)
pH: 5 (ref 5.0–8.0)

## 2019-10-17 LAB — COMPREHENSIVE METABOLIC PANEL
ALT: 18 U/L (ref 0–44)
AST: 24 U/L (ref 15–41)
Albumin: 5.5 g/dL — ABNORMAL HIGH (ref 3.5–5.0)
Alkaline Phosphatase: 73 U/L (ref 38–126)
Anion gap: 24 — ABNORMAL HIGH (ref 5–15)
BUN: 20 mg/dL (ref 6–20)
CO2: 16 mmol/L — ABNORMAL LOW (ref 22–32)
Calcium: 10.6 mg/dL — ABNORMAL HIGH (ref 8.9–10.3)
Chloride: 99 mmol/L (ref 98–111)
Creatinine, Ser: 0.67 mg/dL (ref 0.44–1.00)
GFR calc Af Amer: >60 mL/min (ref >60)
GFR calc non Af Amer: >60 mL/min (ref >60)
Glucose, Bld: 133 mg/dL — ABNORMAL HIGH (ref 70–99)
Potassium: 3.7 mmol/L (ref 3.5–5.1)
Sodium: 139 mmol/L (ref 135–145)
Total Bilirubin: 1 mg/dL (ref 0.3–1.2)
Total Protein: 9.9 g/dL — ABNORMAL HIGH (ref 6.5–8.1)

## 2019-10-17 LAB — I-STAT BETA HCG BLOOD, ED (MC, WL, AP ONLY): I-stat hCG, quantitative: <5 m[IU]/mL (ref <5)

## 2019-10-17 LAB — RAPID URINE DRUG SCREEN, HOSP PERFORMED
Amphetamines: NOT DETECTED
Barbiturates: NOT DETECTED
Benzodiazepines: NOT DETECTED
Cocaine: NOT DETECTED
Opiates: NOT DETECTED
Tetrahydrocannabinol: POSITIVE — AB

## 2019-10-17 LAB — CBC
HCT: 49.8 % — ABNORMAL HIGH (ref 36.0–46.0)
Hemoglobin: 15 g/dL (ref 12.0–15.0)
MCH: 26.4 pg (ref 26.0–34.0)
MCHC: 30.1 g/dL (ref 30.0–36.0)
MCV: 87.7 fL (ref 80.0–100.0)
Platelets: 291 10*3/uL (ref 150–400)
RBC: 5.68 MIL/uL — ABNORMAL HIGH (ref 3.87–5.11)
RDW: 14.5 % (ref 11.5–15.5)
WBC: 11.9 10*3/uL — ABNORMAL HIGH (ref 4.0–10.5)
nRBC: 0 % (ref 0.0–0.2)

## 2019-10-17 LAB — LACTIC ACID, PLASMA: Lactic Acid, Venous: 1.8 mmol/L (ref 0.5–1.9)

## 2019-10-17 LAB — LIPASE, BLOOD: Lipase: 22 U/L (ref 11–51)

## 2019-10-17 IMAGING — CT CT CTA ABD/PEL W/CM AND/OR W/O CM
3 of 12 series · 13 of 46 positions shown, 17 images · IV contrast (omnipaque)
Comparison: None.

CLINICAL DATA: Nausea, vomiting.

EXAM:
CTA ABDOMEN AND PELVIS WITHOUT AND WITH CONTRAST
TECHNIQUE: Multidetector CT imaging of the abdomen and pelvis was performed
using the standard protocol during bolus administration of
intravenous contrast. Multiplanar reconstructed images and MIPs were
obtained and reviewed to evaluate the vascular anatomy.
CONTRAST:  100mL OMNIPAQUE IOHEXOL 350 MG/ML SOLN

[Series 6: axial arterial · axial · arterial · 0.80mm/px · z∈[-327,-15]mm · 8 of 202 slices shown]
[im 23/202  soft-tissue]
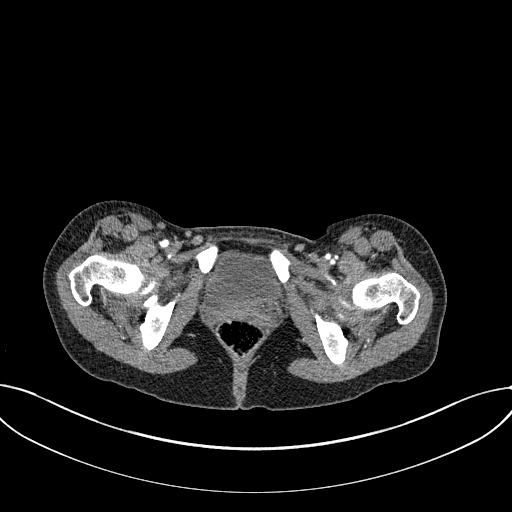
[im 45/202  soft-tissue]
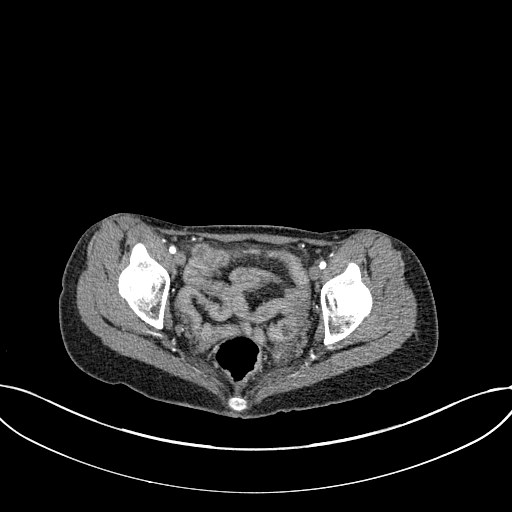
[im 68/202  soft-tissue]
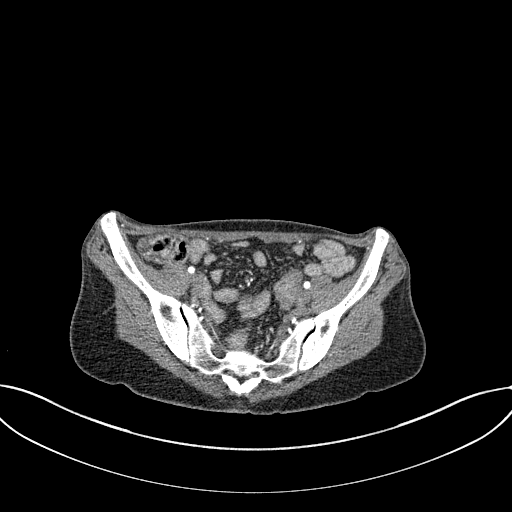
[im 90/202  soft-tissue]
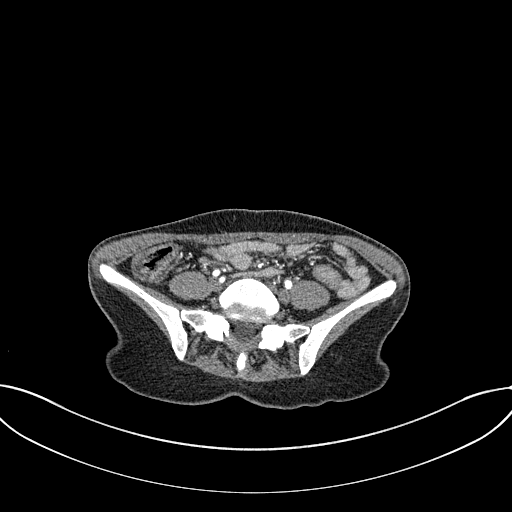
[im 112/202  soft-tissue]
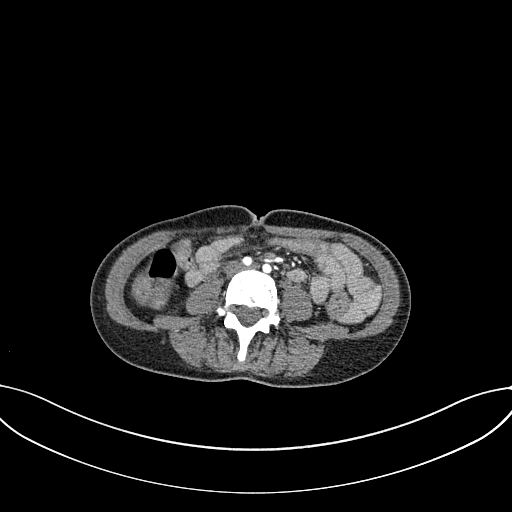
[im 135/202  soft-tissue]
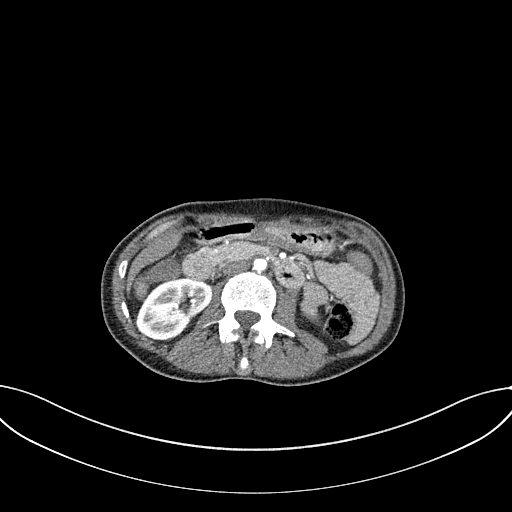
[im 157/202  soft-tissue]
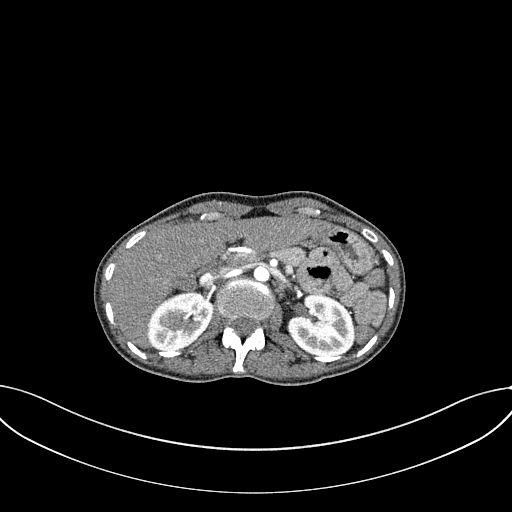
[im 179/202  soft-tissue]
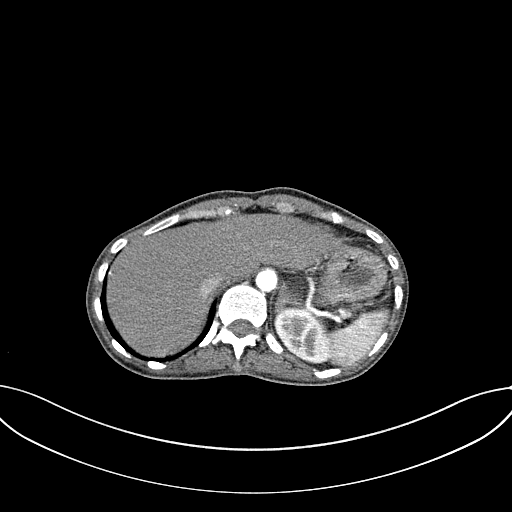

[Series 13: axial portal venous · axial · portal-venous · 0.86mm/px · z∈[-239,-104]mm · 2 of 82 slices shown, 5 images]
[im 28/82  soft-tissue]
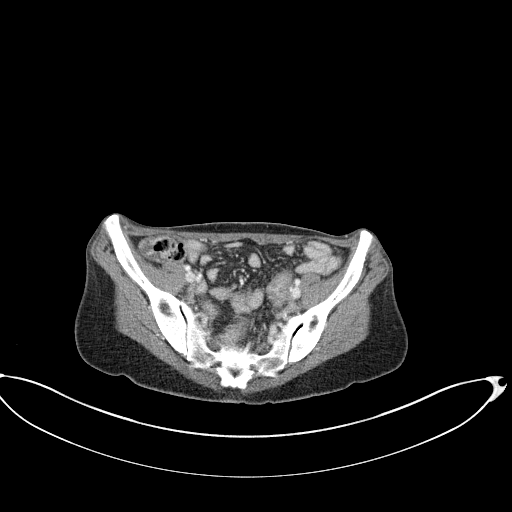
[im 28/82  lung]
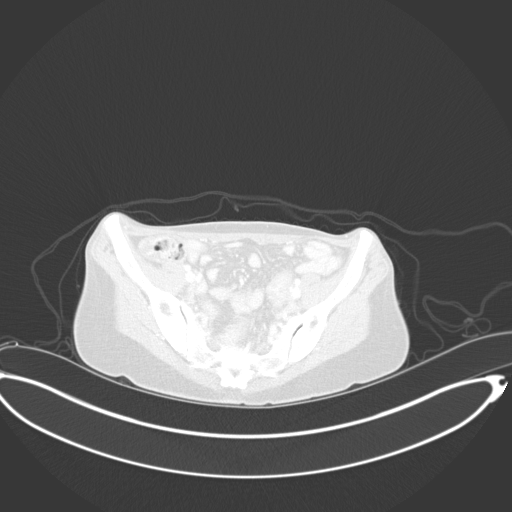
[im 28/82  bone]
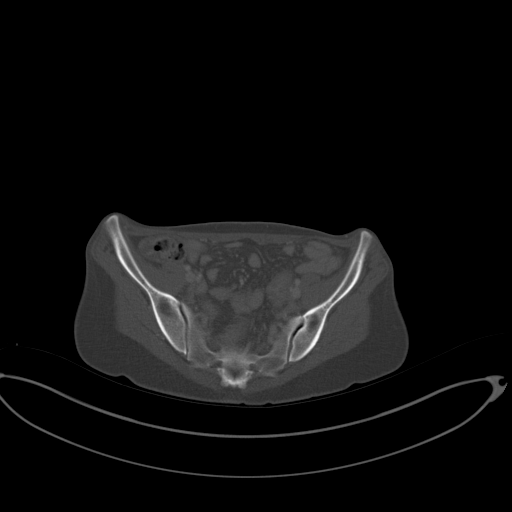
[im 55/82  soft-tissue]
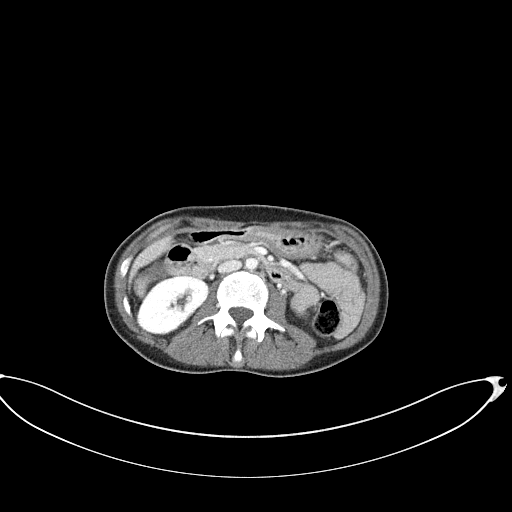
[im 55/82  lung]
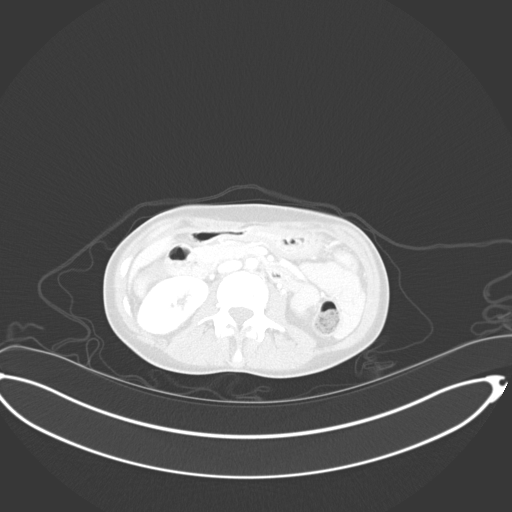

[Series 14: coronal venous · coronal · portal-venous · 0.81mm/px · 3 of 217 slices shown, 4 images]
[im 55/217  soft-tissue]
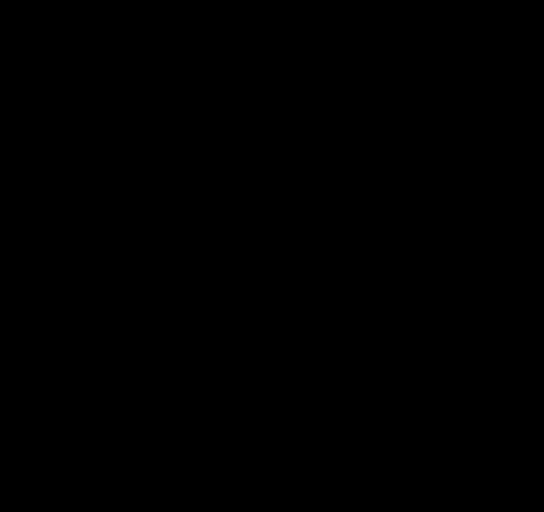
[im 109/217  soft-tissue]
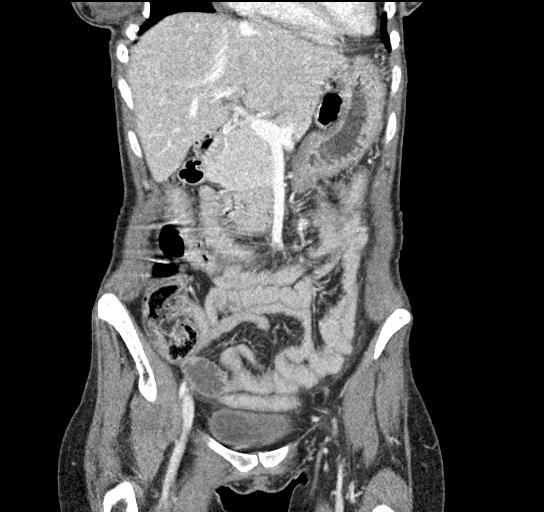
[im 109/217  bone]
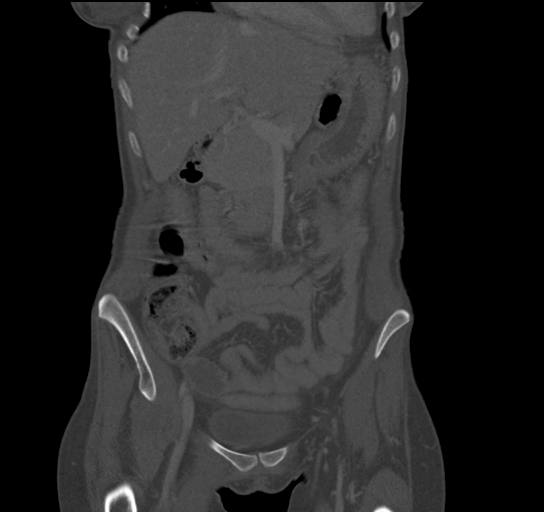
[im 163/217  soft-tissue]
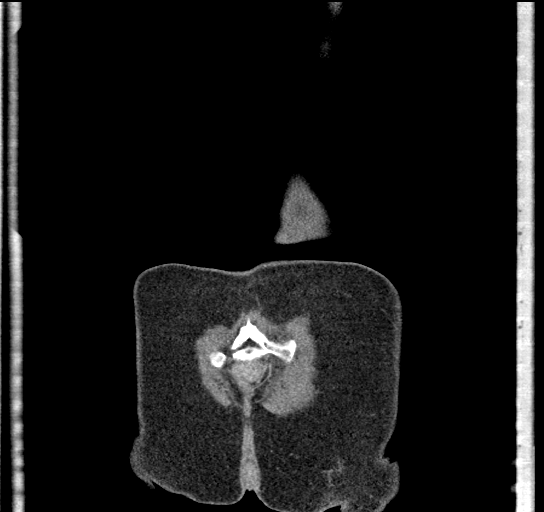

[13 of 46 positions shown; findings below may reference images not displayed]

FINDINGS: VASCULAR

Aorta: Normal caliber aorta without aneurysm, dissection, vasculitis
or significant stenosis.

Celiac: Patent without evidence of aneurysm, dissection, vasculitis
or significant stenosis.

SMA: Patent without evidence of aneurysm, dissection, vasculitis or
significant stenosis.

Renals: Bilateral renal arteries are patent without evidence of
aneurysm, dissection, vasculitis, fibromuscular dysplasia or
significant stenosis. Two left renal arteries are noted.

IMA: Patent without evidence of aneurysm, dissection, vasculitis or
significant stenosis.

Inflow: Patent without evidence of aneurysm, dissection, vasculitis
or significant stenosis.

Proximal Outflow: Bilateral common femoral and visualized portions
of the superficial and profunda femoral arteries are patent without
evidence of aneurysm, dissection, vasculitis or significant
stenosis.

Veins: No obvious venous abnormality within the limitations of this
arterial phase study.

Review of the MIP images confirms the above findings.

NON-VASCULAR

Lower chest: No acute abnormality.

Hepatobiliary: No focal liver abnormality is seen. No gallstones,
gallbladder wall thickening, or biliary dilatation.

Pancreas: Unremarkable. No pancreatic ductal dilatation or
surrounding inflammatory changes.

Spleen: Normal in size without focal abnormality.

Adrenals/Urinary Tract: Adrenal glands are unremarkable. Kidneys are
normal, without renal calculi, focal lesion, or hydronephrosis.
Bladder is unremarkable.

Stomach/Bowel: The stomach appears normal. There is no evidence of
bowel obstruction or inflammation. Status post appendectomy.

Lymphatic: No significant adenopathy is noted.

Reproductive: Status post hysterectomy. No adnexal masses.

Other: No abdominal wall hernia or abnormality. No abdominopelvic
ascites.

Musculoskeletal: No acute or significant osseous findings.
IMPRESSION: No evidence of abdominal aortic aneurysm or dissection.

No evidence of mesenteric or renal artery stenosis is noted.

No acute abnormality seen in the abdomen or pelvis.

## 2019-10-17 MED ORDER — IOHEXOL 350 MG/ML SOLN
100.0000 mL | Freq: Once | INTRAVENOUS | Status: AC | PRN
  Administered 2019-10-17: 08:00:00 100 mL via INTRAVENOUS

## 2019-10-17 MED ORDER — SODIUM CHLORIDE (PF) 0.9 % IJ SOLN
INTRAMUSCULAR | Status: AC
  Filled 2019-10-17: qty 50

## 2019-10-17 MED ORDER — SODIUM CHLORIDE 0.9% FLUSH
3.0000 mL | Freq: Once | INTRAVENOUS | Status: AC
  Administered 2019-10-17: 04:00:00 3 mL via INTRAVENOUS

## 2019-10-17 MED ORDER — PROMETHAZINE HCL 25 MG/ML IJ SOLN
25.0000 mg | Freq: Once | INTRAMUSCULAR | Status: AC
  Administered 2019-10-17: 06:00:00 25 mg via INTRAVENOUS
  Filled 2019-10-17: qty 1

## 2019-10-17 MED ORDER — HALOPERIDOL LACTATE 5 MG/ML IJ SOLN
5.0000 mg | Freq: Once | INTRAMUSCULAR | Status: AC
  Administered 2019-10-17: 5 mg via INTRAVENOUS
  Filled 2019-10-17: qty 1

## 2019-10-17 MED ORDER — FENTANYL CITRATE (PF) 100 MCG/2ML IJ SOLN
50.0000 ug | INTRAMUSCULAR | Status: DC | PRN
  Administered 2019-10-17: 06:00:00 50 ug via INTRAVENOUS
  Filled 2019-10-17: qty 2

## 2019-10-17 MED ORDER — SODIUM CHLORIDE 0.9 % IV BOLUS
1000.0000 mL | Freq: Once | INTRAVENOUS | Status: AC
  Administered 2019-10-17: 1000 mL via INTRAVENOUS

## 2019-10-17 NOTE — ED Provider Notes (Signed)
9:32 AM Received signout at the beginning of shift.  Please refer to previous provider's note for full H&P.  This is a 43 year old female with history of celiac disease, GERD, cyclic vomiting syndrome, cannabinoid hyperemesis, anxiety presenting complaining of abdominal pain associate nausea and vomiting for the past 4 days.  She has been previously evaluated by [facility] gastroenterology last month for her symptom.  It was recommended for patient to have an abdominal pelvic CT angiogram during her next cyclical vomiting episodes.  Today, her work-up does include an abdominal pelvis CT angiogram that shows no acute finding.  Normal lactic acid, pregnancy test is negative, UA showing 80 ketones but no signs of urinary tract infection.  She does have an anion gap of 24 initially, but had been receiving fluid and currently tolerates p.o.  UDS positive for THC.  Mildly elevated white count of 11.9.  I suspect patient symptom is likely related to cannabinoid hyperemesis syndrome.  She does have close follow-up with gastroenterologist which I encourage patient to follow-up for further evaluation.  BP 130/89   Pulse (!) 110   Temp 98 F (36.7 C) (Oral)   Resp 18   Ht 5' 3"  (1.6 m)   Wt 45.7 kg   SpO2 97%   BMI 17.86 kg/m   Results for orders placed or performed during the hospital encounter of 10/17/19  Lipase, blood  Result Value Ref Range   Lipase 22 11 - 51 U/L  Comprehensive metabolic panel  Result Value Ref Range   Sodium 139 135 - 145 mmol/L   Potassium 3.7 3.5 - 5.1 mmol/L   Chloride 99 98 - 111 mmol/L   CO2 16 (L) 22 - 32 mmol/L   Glucose, Bld 133 (H) 70 - 99 mg/dL   BUN 20 6 - 20 mg/dL   Creatinine, Ser 0.67 0.44 - 1.00 mg/dL   Calcium 10.6 (H) 8.9 - 10.3 mg/dL   Total Protein 9.9 (H) 6.5 - 8.1 g/dL   Albumin 5.5 (H) 3.5 - 5.0 g/dL   AST 24 15 - 41 U/L   ALT 18 0 - 44 U/L   Alkaline Phosphatase 73 38 - 126 U/L   Total Bilirubin 1.0 0.3 - 1.2 mg/dL   GFR calc non Af Amer >60 >60  mL/min   GFR calc Af Amer >60 >60 mL/min   Anion gap 24 (H) 5 - 15  CBC  Result Value Ref Range   WBC 11.9 (H) 4.0 - 10.5 K/uL   RBC 5.68 (H) 3.87 - 5.11 MIL/uL   Hemoglobin 15.0 12.0 - 15.0 g/dL   HCT 49.8 (H) 36.0 - 46.0 %   MCV 87.7 80.0 - 100.0 fL   MCH 26.4 26.0 - 34.0 pg   MCHC 30.1 30.0 - 36.0 g/dL   RDW 14.5 11.5 - 15.5 %   Platelets 291 150 - 400 K/uL   nRBC 0.0 0.0 - 0.2 %  Urinalysis, Routine w reflex microscopic  Result Value Ref Range   Color, Urine YELLOW YELLOW   APPearance CLEAR CLEAR   Specific Gravity, Urine 1.017 1.005 - 1.030   pH 5.0 5.0 - 8.0   Glucose, UA 50 (A) NEGATIVE mg/dL   Hgb urine dipstick SMALL (A) NEGATIVE   Bilirubin Urine NEGATIVE NEGATIVE   Ketones, ur 80 (A) NEGATIVE mg/dL   Protein, ur 100 (A) NEGATIVE mg/dL   Nitrite NEGATIVE NEGATIVE   Leukocytes,Ua NEGATIVE NEGATIVE   RBC / HPF 0-5 0 - 5 RBC/hpf   WBC, UA  0-5 0 - 5 WBC/hpf   Bacteria, UA RARE (A) NONE SEEN   Squamous Epithelial / LPF 0-5 0 - 5  Rapid urine drug screen (hospital performed)  Result Value Ref Range   Opiates NONE DETECTED NONE DETECTED   Cocaine NONE DETECTED NONE DETECTED   Benzodiazepines NONE DETECTED NONE DETECTED   Amphetamines NONE DETECTED NONE DETECTED   Tetrahydrocannabinol POSITIVE (A) NONE DETECTED   Barbiturates NONE DETECTED NONE DETECTED  Lactic acid, plasma  Result Value Ref Range   Lactic Acid, Venous 1.8 0.5 - 1.9 mmol/L  I-Stat beta hCG blood, ED  Result Value Ref Range   I-stat hCG, quantitative <5.0 <5 mIU/mL   Comment 3           CT Angio Abd/Pel W and/or Wo Contrast  Result Date: 10/17/2019 CLINICAL DATA:  Nausea, vomiting. EXAM: CTA ABDOMEN AND PELVIS WITHOUT AND WITH CONTRAST TECHNIQUE: Multidetector CT imaging of the abdomen and pelvis was performed using the standard protocol during bolus administration of intravenous contrast. Multiplanar reconstructed images and MIPs were obtained and reviewed to evaluate the vascular anatomy.  CONTRAST:  133m OMNIPAQUE IOHEXOL 350 MG/ML SOLN COMPARISON:  None. FINDINGS: VASCULAR Aorta: Normal caliber aorta without aneurysm, dissection, vasculitis or significant stenosis. Celiac: Patent without evidence of aneurysm, dissection, vasculitis or significant stenosis. SMA: Patent without evidence of aneurysm, dissection, vasculitis or significant stenosis. Renals: Bilateral renal arteries are patent without evidence of aneurysm, dissection, vasculitis, fibromuscular dysplasia or significant stenosis. Two left renal arteries are noted. IMA: Patent without evidence of aneurysm, dissection, vasculitis or significant stenosis. Inflow: Patent without evidence of aneurysm, dissection, vasculitis or significant stenosis. Proximal Outflow: Bilateral common femoral and visualized portions of the superficial and profunda femoral arteries are patent without evidence of aneurysm, dissection, vasculitis or significant stenosis. Veins: No obvious venous abnormality within the limitations of this arterial phase study. Review of the MIP images confirms the above findings. NON-VASCULAR Lower chest: No acute abnormality. Hepatobiliary: No focal liver abnormality is seen. No gallstones, gallbladder wall thickening, or biliary dilatation. Pancreas: Unremarkable. No pancreatic ductal dilatation or surrounding inflammatory changes. Spleen: Normal in size without focal abnormality. Adrenals/Urinary Tract: Adrenal glands are unremarkable. Kidneys are normal, without renal calculi, focal lesion, or hydronephrosis. Bladder is unremarkable. Stomach/Bowel: The stomach appears normal. There is no evidence of bowel obstruction or inflammation. Status post appendectomy. Lymphatic: No significant adenopathy is noted. Reproductive: Status post hysterectomy. No adnexal masses. Other: No abdominal wall hernia or abnormality. No abdominopelvic ascites. Musculoskeletal: No acute or significant osseous findings. IMPRESSION: No evidence of  abdominal aortic aneurysm or dissection. No evidence of mesenteric or renal artery stenosis is noted. No acute abnormality seen in the abdomen or pelvis. Electronically Signed   By: JMarijo ConceptionM.D.   On: 10/17/2019 08:43      TDomenic Moras PA-C 10/17/19 01610   POrpah Greek MD 10/22/19 [phone]

## 2019-10-17 NOTE — ED Triage Notes (Signed)
Patient is complaining of abdominal pain and vomiting. Patient has a hx of celiac disease. Patient states this started two days ago.

## 2019-10-17 NOTE — ED Provider Notes (Signed)
[facility] DEPT Provider Note   CSN: [id number] Arrival date & time: 10/17/19  0301     History Chief Complaint  Patient presents with  . Emesis  . Abdominal Pain    Allison Leonard is a 43 y.o. female with a hx of GERD, celiac's disease, cyclic vomiting syndrome/cannabinoid hyperemesis, anxiety presents to the Emergency Department complaining of gradual, persistent, progressively worsening nausea, vomiting and associated abdominal pain onset 4 days ago.  Patient reports the same as previous episodes of cyclic vomiting syndrome.  She reports no marijuana since last month.  Patient reports intermittent small blood clots with vomiting.  She is not currently anticoagulated.  She is a surgical history of hysterectomy and appendectomy.  She has never had a bowel obstruction.  She denies alcohol usage.  She does smoke half a pack per day of cigarettes.  She has tried taking dicyclomine, pantoprazole, famotidine and lorazepam without relief over the last few days.  Records reviewed.  Patient saw gastroenterology on 09/28/2019.  At that visit she admitted to daily marijuana usage.  Once for epigastric abdominal pain and cyclic vomiting.  She has had numerous abdominal CTs without acute abnormalities.  Upper quadrant abdominal ultrasound in July 2020 was with normal gallbladder.    The history is provided by the patient and medical records. No language interpreter was used.       Past Medical History:  Diagnosis Date  . Acid reflux 07/04/2018  . Anemia   . Celiac disease/sprue   . Cyclic vomiting syndrome   . Generalized anxiety disorder 07/04/2018  . History of colon polyps    last colonoscopy 2018 removed multiple benign polyps  . Migraines 07/04/2018    Patient Active Problem List   Diagnosis Date Noted  . Cyclic vomiting syndrome 09/17/2019  . Vaginal spotting 05/14/2019  . Trichimoniasis 05/14/2019  . Patient underweight 05/14/2019  .  Metabolic acidosis 49/17/9150  . Volume depletion 11/19/2018  . Cannabis abuse 11/19/2018  . Nausea and vomiting 11/19/2018  . Cannabinoid hyperemesis syndrome 10/27/2018  . Stress 07/04/2018  . Acid reflux 07/04/2018  . Migraines 07/04/2018    Past Surgical History:  Procedure Laterality Date  . ABDOMINAL HYSTERECTOMY  2006  . APPENDECTOMY  1999  . COLONOSCOPY  2018  . ESOPHAGOGASTRODUODENOSCOPY  2018     OB History   No obstetric history on file.     Family History  Problem Relation Age of Onset  . Diabetes Father   . Hypertension Father   . Hyperlipidemia Father   . Colon polyps Father   . Heart attack Maternal Grandmother   . Heart attack Paternal Grandmother   . Cervical cancer Maternal Aunt   . Throat cancer Maternal Uncle   . Stroke Maternal Uncle   . Down syndrome Son   . Colon cancer Neg Hx     Social History   Tobacco Use  . Smoking status: Current Some Day Smoker    Packs/day: 0.50    Years: 7.00    Pack years: 3.50    Types: Cigarettes  . Smokeless tobacco: Never Used  Substance Use Topics  . Alcohol use: Not Currently  . Drug use: Not Currently    Types: Marijuana    Comment: quit a month ago per patient    Home Medications Prior to Admission medications   Medication Sig Start Date End Date Taking? Authorizing Provider  aspirin-acetaminophen-caffeine (EXCEDRIN MIGRAINE) [phone] MG tablet Take 1 tablet by mouth every 4 (four) hours  as needed for headache.    [provider]  dicyclomine (BENTYL) 20 MG tablet TAKE 1 TABLET (20 MG TOTAL) BY MOUTH 4 (FOUR) TIMES DAILY - BEFORE MEALS AND AT BEDTIME. 06/08/19   Thornton Park, MD  LORazepam (ATIVAN) 0.5 MG tablet Take 2 tablets (1 mg total) by mouth 3 (three) times daily as needed (nausea and vomiting). 05/07/19   Tegeler, Gwenyth Allegra, MD  metoCLOPramide (REGLAN) 10 MG tablet Take 1 tablet (10 mg total) by mouth every 8 (eight) hours as needed for nausea or vomiting. 09/16/19   Caroline More, DO  pantoprazole (PROTONIX) 20 MG tablet Take 1 tablet (20 mg total) by mouth daily. 09/16/19   Caroline More, DO  promethazine (PHENERGAN) 12.5 MG tablet Take 1 tablet (12.5 mg total) by mouth every 12 (twelve) hours as needed for nausea or vomiting. 09/28/19   Noralyn Pick, NP  prochlorperazine (COMPAZINE) 10 MG tablet Take 1 tablet (10 mg total) by mouth every 8 (eight) hours as needed for nausea or vomiting. Patient not taking: Reported on 05/07/2019 02/20/19 09/11/19  Duffy Bruce, MD    Allergies    Zofran Alvis Lemmings hcl] and Morphine and related  Review of Systems   Review of Systems  Constitutional: Negative for appetite change, diaphoresis, fatigue, fever and unexpected weight change.  HENT: Negative for mouth sores.   Eyes: Negative for visual disturbance.  Respiratory: Negative for cough, chest tightness, shortness of breath and wheezing.   Cardiovascular: Negative for chest pain.  Gastrointestinal: Positive for abdominal pain, nausea and vomiting. Negative for constipation and diarrhea.  Endocrine: Negative for polydipsia, polyphagia and polyuria.  Genitourinary: Negative for dysuria, frequency, hematuria and urgency.  Musculoskeletal: Negative for back pain and neck stiffness.  Skin: Negative for rash.  Allergic/Immunologic: Negative for immunocompromised state.  Neurological: Negative for syncope, light-headedness and headaches.  Hematological: Does not bruise/bleed easily.  Psychiatric/Behavioral: Negative for sleep disturbance. The patient is not nervous/anxious.     Physical Exam Updated Vital Signs BP (!) 142/89   Pulse (!) 102   Temp 98 F (36.7 C) (Oral)   Resp 17   Ht 5' 3"  (1.6 m)   Wt 45.7 kg   SpO2 98%   BMI 17.86 kg/m   Physical Exam Vitals and nursing note reviewed.  Constitutional:      General: She is not in acute distress.    Appearance: She is not diaphoretic.     Comments: Patient dry heaving and gagging on exam.   HENT:     Head: Normocephalic.     Mouth/Throat:     Mouth: Mucous membranes are dry.  Eyes:     General: No scleral icterus.    Conjunctiva/sclera: Conjunctivae normal.  Cardiovascular:     Rate and Rhythm: Regular rhythm. Tachycardia present.     Pulses: Normal pulses.          Radial pulses are 2+ on the right side and 2+ on the left side.  Pulmonary:     Effort: Pulmonary effort is normal. No tachypnea, accessory muscle usage, prolonged expiration, respiratory distress or retractions.     Breath sounds: No stridor.     Comments: Equal chest rise. No increased work of breathing. Abdominal:     General: There is no distension.     Palpations: Abdomen is soft.     Tenderness: There is abdominal tenderness (generalized). There is no right CVA tenderness, left CVA tenderness, guarding or rebound.  Musculoskeletal:     Cervical back: Normal  range of motion.     Comments: Moves all extremities equally and without difficulty.  Skin:    General: Skin is warm and dry.     Capillary Refill: Capillary refill takes less than 2 seconds.  Neurological:     Mental Status: She is alert.     GCS: GCS eye subscore is 4. GCS verbal subscore is 5. GCS motor subscore is 6.     Comments: Speech is clear and goal oriented.  Psychiatric:        Mood and Affect: Mood normal.     ED Results / Procedures / Treatments   Labs (all labs ordered are listed, but only abnormal results are displayed) Labs Reviewed  COMPREHENSIVE METABOLIC PANEL - Abnormal; Notable for the following components:      Result Value   CO2 16 (*)    Glucose, Bld 133 (*)    Calcium 10.6 (*)    Total Protein 9.9 (*)    Albumin 5.5 (*)    Anion gap 24 (*)    All other components within normal limits  CBC - Abnormal; Notable for the following components:   WBC 11.9 (*)    RBC 5.68 (*)    HCT 49.8 (*)    All other components within normal limits  LIPASE, BLOOD  URINALYSIS, ROUTINE W REFLEX MICROSCOPIC  RAPID URINE DRUG  SCREEN, HOSP PERFORMED  LACTIC ACID, PLASMA  I-STAT BETA HCG BLOOD, ED ([facility], [facility], [facility] ONLY)    Procedures Procedures (including critical care time)  Medications Ordered in ED Medications  fentaNYL (SUBLIMAZE) injection 50 mcg (50 mcg Intravenous Given 10/17/19 0605)  sodium chloride flush (NS) 0.9 % injection 3 mL (3 mLs Intravenous Given 10/17/19 0358)  sodium chloride 0.9 % bolus 1,000 mL (0 mLs Intravenous Stopped 10/17/19 0543)  promethazine (PHENERGAN) injection 25 mg (25 mg Intravenous Given 10/17/19 0603)  haloperidol lactate (HALDOL) injection 5 mg (5 mg Intravenous Given 10/17/19 0654)    ED Course  I have reviewed the triage vital signs and the nursing notes.  Pertinent labs & imaging results that were available during my care of the patient were reviewed by me and considered in my medical decision making (see chart for details).  Clinical Course as of Oct 16 657  Sat Oct 17, 2019  9678 Tachycardia  Pulse Rate(!): 119 [HM]  0434 afebrile  Temp: 98 F (36.7 C) [HM]    Clinical Course User Index [HM] Yazhini Mcaulay, Gwenlyn Perking   MDM Rules/Calculators/A&P                         Patient with history of cyclic vomiting and cannabinoid hyperemesis.  She was evaluated by gastroenterology on 09/28/2019.  And their recommendations they recommended considering abdominal pelvic CT angiogram with next attack.  Will order today in the emergency department while attempting to control symptoms.  Persistently dry heaving and gagging into emesis bag.  Abdomen is generally tender.  Could show patient has had improvement with Haldol in the past.  Will give same today along with some pain control.  No improvement after Zofran.  Fluids given.  6:57 AM At shift change care was transferred to Domenic Moras, PA-C who will follow pending studies/labs, re-evaulate and determine disposition.  Given severe symptoms and severe anion gap I suspect she will likely need admission.   Final  Clinical Impression(s) / ED Diagnoses Final diagnoses:  Generalized abdominal pain  Intractable vomiting with nausea, unspecified vomiting type  Rx / DC Orders ED Discharge Orders    None       Shandrell Boda, Gwenlyn Perking 10/17/19 0816    Orpah Greek, MD 10/17/19 [phone]

## 2019-10-17 NOTE — Discharge Instructions (Signed)
Please call and follow up closely with your gastroenterologist for further evaluation and management of your symptoms.  Return if you have any concerns.  Avoid using marijuana as it may contribute to your condition.

## 2019-10-18 ENCOUNTER — Other Ambulatory Visit: Payer: Self-pay | Admitting: Nurse Practitioner

## 2019-10-20 ENCOUNTER — Ambulatory Visit: Payer: Medicaid Other | Admitting: Nurse Practitioner

## 2019-10-26 ENCOUNTER — Ambulatory Visit: Payer: Medicaid Other

## 2019-11-02 ENCOUNTER — Other Ambulatory Visit: Payer: Self-pay

## 2019-11-02 ENCOUNTER — Ambulatory Visit (INDEPENDENT_AMBULATORY_CARE_PROVIDER_SITE_OTHER): Payer: Medicaid Other | Admitting: Family Medicine

## 2019-11-02 VITALS — BP 100/65 | HR 90 | Wt 105.6 lb

## 2019-11-02 DIAGNOSIS — R1115 Cyclical vomiting syndrome unrelated to migraine: Secondary | ICD-10-CM

## 2019-11-02 MED ORDER — PROMETHAZINE HCL 12.5 MG PO TABS: 12.5000 mg | ORAL_TABLET | Freq: Two times a day (BID) | ORAL | 2 refills | Status: DC | PRN

## 2019-11-02 NOTE — Progress Notes (Signed)
   Subjective:   Patient ID: Allison Leonard    DOB: [birth year], 43 y.o. female   MRN: [id number]  Allison Leonard is a 43 y.o. female with a history of migraines, acid reflux, cannabinoid hyperemesis syndrome, cyclic vomiting sydrome, celiac disease here for ED follow up.  ED Follow up for Abdominal pain, Acute on Chronic Nausea/Vomting:  Patient was seen in [facility] ED on 12/12 for abdominal pain, nausea and vomiting. Abdominal pelvis CT angiogram was negative for any acute findings. Labs were unremarkable including normal lactic acid, pregnancy test is negative, UA showing 80 ketones but no signs of urinary tract infection, UDS positive for THC. She was treated with Fentanyl, fluids, phenergan, and haldol with improvement in her symptoms. She was discharged with plan to follow up with her GI doctor.  She follows with [facility] and has appointment scheduled for 11/18/19. She plans to attend. Denies any current pain. Notes she mostly has epigastric pain prior to eating and first thing in the morning, sometimes has some spasms of her intestes. These are all chronic and improved with Dicyclomine, Protonix, Phenergan PRN, and Ativan PRN. These helped with her symptoms. She denies any current pain. Notes her pain in her stomach is improved with the protonix. Notes decreased bowel movements. Recently went 2 weeks before she had a bowel movement. Had her first bowel movement this morning. Takes Colace 1-2x/week. Notes she had a bowel movement this morning that was normal color and consistency. Denies blood. Notes some nausea but no vomiting since her ED visit. Notes compliance with her glutin free diet.  Review of Systems:  Per HPI.   Effort, medications and smoking status reviewed.  Objective:   BP 100/65   Pulse 90   Wt 105 lb 9.6 oz (47.9 kg)   SpO2 100%   BMI 18.71 kg/m  Vitals and nursing note reviewed.  General: pleasant tall, very thin female, sitting comfortably in exam chair, in  no acute distress with non-toxic appearance CV: regular rate and rhythm without murmurs, rubs, or gallops Lungs: clear to auscultation bilaterally with normal work of breathing Abdomen: soft, non-tender, non-distended, mildly decreased bowel sounds Skin: warm, dry, no rashes or lesions Extremities: warm and well perfused MSK: gait normal  Assessment & Plan:   Cyclic vomiting syndrome 43 y.o. very thin female with PMH of cyclic vomiting syndrome, cannabinoid hyperemesis syndrome, dyspepsia/GER, and celiac disease presenting for follow up after presenting to ED on 10/17/19 with abdominal pain, nausea and vomiting. Symptoms resolved with GI cocktail. She is asymptomatic today. Requests refill for Phenergan as this helps with her nausea and vomiting. Patient scheduled for follow up with [facility] on 11/18/19. She intends to attend as scheduled. Exam unremarkable today. Does endorse chronic constipation that does not resolve with intermittent use of Colace. Recommended daily use of Miralax QD to BID until regular. Also recommended OTC Gaviscon for break through dyspepsia. She understood and agreed to plan.  No orders of the defined types were placed in this encounter.  Meds ordered this encounter  Medications  . promethazine (PHENERGAN) 12.5 MG tablet    Sig: Take 1 tablet (12.5 mg total) by mouth every 12 (twelve) hours as needed for nausea or vomiting.    Dispense:  30 tablet    Refill:  2    Mina Marble, DO PGY-2, [facility] 11/06/2019 4:31 PM

## 2019-11-02 NOTE — Patient Instructions (Signed)
Please follow up with your GI doctor as scheduled.  Try Miralax daily to twice a day to help with your bowel movements. Eat lots of fiber as well. I would stop the colace since you are not seeing any benefit  You can try Gaviscon (over the counter) for break through stomach pain as well.

## 2019-11-06 NOTE — Assessment & Plan Note (Addendum)
44 y.o. very thin female with PMH of cyclic vomiting syndrome, cannabinoid hyperemesis syndrome, dyspepsia/GER, and celiac disease presenting for follow up after presenting to ED on 10/17/19 with abdominal pain, nausea and vomiting. Symptoms resolved with GI cocktail. She is asymptomatic today. Requests refill for Phenergan as this helps with her nausea and vomiting. Patient scheduled for follow up with [facility] on 11/18/19. She intends to attend as scheduled. Exam unremarkable today. Does endorse chronic constipation that does not resolve with intermittent use of Colace. Recommended daily use of Miralax QD to BID until regular. Also recommended OTC Gaviscon for break through dyspepsia. She understood and agreed to plan.

## 2019-11-09 ENCOUNTER — Telehealth: Payer: Self-pay

## 2019-11-09 DIAGNOSIS — G43909 Migraine, unspecified, not intractable, without status migrainosus: Secondary | ICD-10-CM

## 2019-11-09 NOTE — Telephone Encounter (Signed)
Pt calling and asking for referral to [facility] to be placed. Ottis Stain, CMA

## 2019-11-09 NOTE — Telephone Encounter (Signed)
Spoke with patient who stated that she was having increased frequency of her headaches.  Reported no change in her headache type.  Requesting referral to neurology.  Referral ordered.  Lyndee Hensen, DO PGY-1, [facility] 11/09/2019 4:56 PM

## 2019-11-18 ENCOUNTER — Ambulatory Visit: Payer: Medicaid Other | Admitting: Nurse Practitioner

## 2019-11-25 ENCOUNTER — Ambulatory Visit: Payer: Medicaid Other | Admitting: Nurse Practitioner

## 2019-12-01 ENCOUNTER — Other Ambulatory Visit: Payer: Self-pay | Admitting: Family Medicine

## 2019-12-07 ENCOUNTER — Ambulatory Visit: Payer: Medicaid Other | Admitting: Nurse Practitioner

## 2019-12-14 ENCOUNTER — Other Ambulatory Visit: Payer: Self-pay | Admitting: *Deleted

## 2019-12-14 MED ORDER — METOCLOPRAMIDE HCL 10 MG PO TABS: 10.0000 mg | ORAL_TABLET | Freq: Three times a day (TID) | ORAL | 0 refills | Status: DC | PRN

## 2019-12-16 ENCOUNTER — Encounter: Payer: Self-pay | Admitting: Neurology

## 2019-12-16 NOTE — Progress Notes (Addendum)
Virtual Visit via Video Note The purpose of this virtual visit is to provide medical care while limiting exposure to the novel coronavirus.    Consent was obtained for video visit:  Yes.   Answered questions that patient had about telehealth interaction:  Yes.   I discussed the limitations, risks, security and privacy concerns of performing an evaluation and management service by telemedicine. I also discussed with the patient that there may be a patient responsible charge related to this service. The patient expressed understanding and agreed to proceed.  Pt location: Home Physician Location: office Name of referring provider:  McDiarmid, Blane Ohara, MD I connected with Allison Leonard at patients initiation/request on 12/18/2019 at  2:50 PM EST by video enabled telemedicine application and verified that I am speaking with the correct person using two identifiers. Pt MRN:  [id number] Pt DOB:  1976-10-14 Video Participants:  Allison Leonard;   History of Present Illness:  Allison Leonard is a 44 year old female with Celiac disease and Cyclic Vomiting Syndrome who presents for migraines.  History supplemented by ED note.  Started having headaches at age 16.  Worse over the past 3 to 4 years.  They are mostly severe sharp or pounding headache in her right temple.  Usually lasts a day.  Sometimes they wake her up at night.  17 days over last 30 days.  Nausea, vomiting, photophobia, phonophobia, blurred vision.  No numbness or weakness.  No specific triggers.  Vomiting may help make it better.  Current NSAIDS:  none Current analgesics:  Excedrin Migraine (4 to 5 days a week) Current triptans:  none Current ergotamine:  none Current anti-emetic:  Reglan 14m; promethazine 12.556mCurrent muscle relaxants:  none Current anti-anxiolytic:  Lorazepam 72m68mRN Current sleep aide:  none Current Antihypertensive medications:  none Current Antidepressant medications:  none  Current Anticonvulsant medications:  none Current anti-CGRP:  none Current Vitamins/Herbal/Supplements:  none Current Antihistamines/Decongestants:  none Other therapy:  none Hormone/birth control:  none  Past NSAIDS:  ibuprofen Past analgesics:  Fioricet Past abortive triptans:  none Past abortive ergotamine:  none Past muscle relaxants:  none Past anti-emetic:  Compazine Past antihypertensive medications:  none Past antidepressant medications:  none Past anticonvulsant medications:  none Past anti-CGRP:  none Past vitamins/Herbal/Supplements:  none Past antihistamines/decongestants:  Benadryl Other past therapies:  none  Caffeine:  Coffee 1 to 2 times a month.  No soda Exercise:  no Depression:  no; Anxiety:  no Other pain:  Sometimes abdominal discomfort Sleep hygiene:  Restless Family history of headache:  Brother, sister, mother, daughter  10/17/2019 LABS:  CBC with WBC 11.9, HGB 15, HCT 49.8, PLT 291; CMP with Na 139, K 3.7, Cl 99, CO2 16, glucose 133, BUN 20, Cr 0.67, t bili 1, ALP 73, AST 24, ALT 18.  Past Medical History: Past Medical History:  Diagnosis Date  . Acid reflux 07/04/2018  . Anemia   . Celiac disease/sprue   . Cyclic vomiting syndrome   . Generalized anxiety disorder 07/04/2018  . History of colon polyps    last colonoscopy 2018 removed multiple benign polyps  . Migraines 07/04/2018    Medications: Outpatient Encounter Medications as of 12/18/2019  Medication Sig  . aspirin-acetaminophen-caffeine (EXCEDRIN MIGRAINE) 250-250-65 MG tablet Take 1 tablet by mouth every 4 (four) hours as needed for headache.  . dicyclomine (BENTYL) 20 MG tablet TAKE 1 TABLET (20 MG TOTAL) BY MOUTH 4 (FOUR) TIMES DAILY - BEFORE MEALS AND AT BEDTIME.  .Marland Kitchen  LORazepam (ATIVAN) 0.5 MG tablet Take 2 tablets (1 mg total) by mouth 3 (three) times daily as needed (nausea and vomiting).  . metoCLOPramide (REGLAN) 10 MG tablet Take 1 tablet (10 mg total) by mouth every 8 (eight) hours  as needed for nausea or vomiting.  . pantoprazole (PROTONIX) 20 MG tablet TAKE 1 TABLET BY MOUTH EVERY DAY  . promethazine (PHENERGAN) 12.5 MG tablet Take 1 tablet (12.5 mg total) by mouth every 12 (twelve) hours as needed for nausea or vomiting.  . SUMAtriptan (IMITREX) 100 MG tablet Take 1 tablet earliest onset of migraine.  May repeat in 2 hours if headache persists or recurs.  Maximum 2 tablets in 24 hours  . topiramate (TOPAMAX) 25 MG tablet Take 1 tablet at bedtime for a week, then increase to 2 tablets at bedtime  . [DISCONTINUED] pantoprazole (PROTONIX) 20 MG tablet Take 1 tablet (20 mg total) by mouth daily.  . [DISCONTINUED] prochlorperazine (COMPAZINE) 10 MG tablet Take 1 tablet (10 mg total) by mouth every 8 (eight) hours as needed for nausea or vomiting. (Patient not taking: Reported on 05/07/2019)   No facility-administered encounter medications on file as of 12/18/2019.    Allergies: Allergies  Allergen Reactions  . Zofran [Ondansetron Hcl] Anaphylaxis  . Morphine And Related Itching    Family History: Family History  Problem Relation Age of Onset  . Diabetes Father   . Hypertension Father   . Hyperlipidemia Father   . Colon polyps Father   . Heart attack Maternal Grandmother   . Heart attack Paternal Grandmother   . Cervical cancer Maternal Aunt   . Throat cancer Maternal Uncle   . Stroke Maternal Uncle   . Down syndrome Son   . Colon cancer Neg Hx     Social History: Social History   Socioeconomic History  . Marital status: Single    Spouse name: Not on file  . Number of children: 3  . Years of education: Not on file  . Highest education level: Not on file  Occupational History  . Not on file  Tobacco Use  . Smoking status: Current Some Day Smoker    Packs/day: 0.50    Years: 7.00    Pack years: 3.50    Types: Cigarettes  . Smokeless tobacco: Never Used  Substance and Sexual Activity  . Alcohol use: Not Currently  . Drug use: Not Currently     Types: Marijuana    Comment: quit a month ago per patient  . Sexual activity: Not on file  Other Topics Concern  . Not on file  Social History Narrative   Right handed   One  Story   Drinks coffee occasionally   Social Determinants of Health   Financial Resource Strain:   . Difficulty of Paying Living Expenses: Not on file  Food Insecurity:   . Worried About Charity fundraiser in the Last Year: Not on file  . Ran Out of Food in the Last Year: Not on file  Transportation Needs:   . Lack of Transportation (Medical): Not on file  . Lack of Transportation (Non-Medical): Not on file  Physical Activity:   . Days of Exercise per Week: Not on file  . Minutes of Exercise per Session: Not on file  Stress:   . Feeling of Stress : Not on file  Social Connections:   . Frequency of Communication with Friends and Family: Not on file  . Frequency of Social Gatherings with Friends and Family: Not  on file  . Attends Religious Services: Not on file  . Active Member of Clubs or Organizations: Not on file  . Attends Archivist Meetings: Not on file  . Marital Status: Not on file  Intimate Partner Violence:   . Fear of Current or Ex-Partner: Not on file  . Emotionally Abused: Not on file  . Physically Abused: Not on file  . Sexually Abused: Not on file    Observations/Objective:   Height 5' 2"  (1.575 m), weight 108 lb (49 kg). No acute distress.  Alert and oriented.  Speech fluent and not dysarthric.  Language intact.   Assessment and Plan:   Chronic migraine without aura, without status migrainosus, not intractable  1.  For preventative management, start topiramate 21m at bedtime for one week, then increase to 539mat bedtime.  We can increase to 7573mt bedtime in 5 weeks if needed. 2.  For abortive therapy, sumatriptan 100m60m  Stop Excedrin.  Limit use of pain relievers to no more than 2 days out of week to prevent risk of rebound or medication-overuse headache. 4.  Keep  headache diary 5.  Exercise, hydration, caffeine cessation, sleep hygiene, monitor for and avoid triggers 6. Follow up in 4 months.   Follow Up Instructions:    -I discussed the assessment and treatment plan with the patient. The patient was provided an opportunity to ask questions and all were answered. The patient agreed with the plan and demonstrated an understanding of the instructions.   The patient was advised to call back or seek an in-person evaluation if the symptoms worsen or if the condition fails to improve as anticipated.   AdamDudley Major  ADDENDUM:: As headaches have been more severe and are waking her up at night, will check MRI and MRA of head to evaluate for mass lesion or aneurysm.

## 2019-12-18 ENCOUNTER — Telehealth (INDEPENDENT_AMBULATORY_CARE_PROVIDER_SITE_OTHER): Payer: Medicaid Other | Admitting: Neurology

## 2019-12-18 ENCOUNTER — Other Ambulatory Visit: Payer: Self-pay

## 2019-12-18 VITALS — Ht 62.0 in | Wt 108.0 lb

## 2019-12-18 DIAGNOSIS — R519 Headache, unspecified: Secondary | ICD-10-CM

## 2019-12-18 DIAGNOSIS — G43709 Chronic migraine without aura, not intractable, without status migrainosus: Secondary | ICD-10-CM | POA: Diagnosis not present

## 2019-12-18 MED ORDER — SUMATRIPTAN SUCCINATE 100 MG PO TABS: ORAL_TABLET | ORAL | 2 refills | Status: DC

## 2019-12-18 MED ORDER — TOPIRAMATE 25 MG PO TABS: ORAL_TABLET | ORAL | 0 refills | Status: DC

## 2019-12-21 ENCOUNTER — Other Ambulatory Visit: Payer: Self-pay

## 2019-12-21 DIAGNOSIS — R519 Headache, unspecified: Secondary | ICD-10-CM

## 2020-01-21 ENCOUNTER — Telehealth: Payer: Self-pay | Admitting: Nurse Practitioner

## 2020-01-21 ENCOUNTER — Encounter: Payer: Self-pay | Admitting: Neurology

## 2020-01-21 DIAGNOSIS — R1115 Cyclical vomiting syndrome unrelated to migraine: Secondary | ICD-10-CM

## 2020-01-21 NOTE — Telephone Encounter (Signed)
The patient has not been seen in a while and would like to have something new for Nausea. Please advise on how you would like to handle this.

## 2020-01-22 MED ORDER — PROMETHAZINE HCL 12.5 MG PO TABS: 12.5000 mg | ORAL_TABLET | Freq: Two times a day (BID) | ORAL | 0 refills | Status: DC | PRN

## 2020-01-22 NOTE — Telephone Encounter (Signed)
Tracy, pls refill Promethazine 12.29m po bid PRN # 20. Pt to schedule office follow up if sx worsen. Thx

## 2020-01-23 ENCOUNTER — Other Ambulatory Visit: Payer: Medicaid Other

## 2020-02-04 ENCOUNTER — Other Ambulatory Visit: Payer: Self-pay | Admitting: Nurse Practitioner

## 2020-02-04 DIAGNOSIS — R1115 Cyclical vomiting syndrome unrelated to migraine: Secondary | ICD-10-CM

## 2020-02-05 ENCOUNTER — Telehealth: Payer: Self-pay | Admitting: Nurse Practitioner

## 2020-02-05 NOTE — Telephone Encounter (Signed)
Olivia Mackie, pls call patient and let her know I refilled her Phenergan RX. Pls schedule pt for a follow up appt as she was in the ED with GI sx 10/17/2019.  thx

## 2020-02-08 NOTE — Telephone Encounter (Signed)
Notified the patient that colleen sent out a script for Phenergan. She was scheduled to follow up 02/24/2020 at 1:30

## 2020-02-09 ENCOUNTER — Emergency Department (HOSPITAL_COMMUNITY)
Admission: EM | Admit: 2020-02-09 | Discharge: 2020-02-09 | Disposition: A | Discharge status: Home / Self Care | Payer: Medicaid Other | Attending: Emergency Medicine | Admitting: Emergency Medicine

## 2020-02-09 ENCOUNTER — Other Ambulatory Visit: Payer: Self-pay

## 2020-02-09 DIAGNOSIS — F17210 Nicotine dependence, cigarettes, uncomplicated: Secondary | ICD-10-CM | POA: Insufficient documentation

## 2020-02-09 DIAGNOSIS — K900 Celiac disease: Secondary | ICD-10-CM | POA: Diagnosis not present

## 2020-02-09 DIAGNOSIS — R1084 Generalized abdominal pain: Secondary | ICD-10-CM | POA: Diagnosis not present

## 2020-02-09 DIAGNOSIS — R1115 Cyclical vomiting syndrome unrelated to migraine: Secondary | ICD-10-CM | POA: Diagnosis not present

## 2020-02-09 DIAGNOSIS — R110 Nausea: Secondary | ICD-10-CM | POA: Diagnosis not present

## 2020-02-09 DIAGNOSIS — R52 Pain, unspecified: Secondary | ICD-10-CM | POA: Diagnosis not present

## 2020-02-09 DIAGNOSIS — Z79899 Other long term (current) drug therapy: Secondary | ICD-10-CM | POA: Diagnosis not present

## 2020-02-09 DIAGNOSIS — R1111 Vomiting without nausea: Secondary | ICD-10-CM | POA: Diagnosis not present

## 2020-02-09 DIAGNOSIS — I10 Essential (primary) hypertension: Secondary | ICD-10-CM | POA: Diagnosis not present

## 2020-02-09 DIAGNOSIS — R112 Nausea with vomiting, unspecified: Secondary | ICD-10-CM | POA: Diagnosis present

## 2020-02-09 LAB — URINALYSIS, ROUTINE W REFLEX MICROSCOPIC
Bilirubin Urine: NEGATIVE
Glucose, UA: NEGATIVE mg/dL
Hgb urine dipstick: NEGATIVE
Ketones, ur: 80 mg/dL — AB
Nitrite: NEGATIVE
Protein, ur: NEGATIVE mg/dL
Specific Gravity, Urine: 1.018 (ref 1.005–1.030)
pH: 6 (ref 5.0–8.0)

## 2020-02-09 LAB — CBC WITH DIFFERENTIAL/PLATELET
Abs Immature Granulocytes: 0.03 10*3/uL (ref 0.00–0.07)
Basophils Absolute: 0 10*3/uL (ref 0.0–0.1)
Basophils Relative: 0 %
Eosinophils Absolute: 0 10*3/uL (ref 0.0–0.5)
Eosinophils Relative: 0 %
HCT: 40.9 % (ref 36.0–46.0)
Hemoglobin: 12.6 g/dL (ref 12.0–15.0)
Immature Granulocytes: 0 %
Lymphocytes Relative: 15 %
Lymphs Abs: 1.5 10*3/uL (ref 0.7–4.0)
MCH: 26.5 pg (ref 26.0–34.0)
MCHC: 30.8 g/dL (ref 30.0–36.0)
MCV: 85.9 fL (ref 80.0–100.0)
Monocytes Absolute: 0.3 10*3/uL (ref 0.1–1.0)
Monocytes Relative: 3 %
Neutro Abs: 8.1 10*3/uL — ABNORMAL HIGH (ref 1.7–7.7)
Neutrophils Relative %: 82 %
Platelets: 252 10*3/uL (ref 150–400)
RBC: 4.76 MIL/uL (ref 3.87–5.11)
RDW: 13.5 % (ref 11.5–15.5)
WBC: 9.9 10*3/uL (ref 4.0–10.5)
nRBC: 0 % (ref 0.0–0.2)

## 2020-02-09 LAB — RAPID URINE DRUG SCREEN, HOSP PERFORMED
Amphetamines: NOT DETECTED
Barbiturates: NOT DETECTED
Benzodiazepines: NOT DETECTED
Cocaine: NOT DETECTED
Opiates: NOT DETECTED
Tetrahydrocannabinol: POSITIVE — AB

## 2020-02-09 LAB — COMPREHENSIVE METABOLIC PANEL
ALT: 12 U/L (ref 0–44)
AST: 17 U/L (ref 15–41)
Albumin: 4.6 g/dL (ref 3.5–5.0)
Alkaline Phosphatase: 54 U/L (ref 38–126)
Anion gap: 15 (ref 5–15)
BUN: 15 mg/dL (ref 6–20)
CO2: 21 mmol/L — ABNORMAL LOW (ref 22–32)
Calcium: 9.4 mg/dL (ref 8.9–10.3)
Chloride: 106 mmol/L (ref 98–111)
Creatinine, Ser: 0.54 mg/dL (ref 0.44–1.00)
GFR calc Af Amer: >60 mL/min (ref >60)
GFR calc non Af Amer: >60 mL/min (ref >60)
Glucose, Bld: 129 mg/dL — ABNORMAL HIGH (ref 70–99)
Potassium: 3.7 mmol/L (ref 3.5–5.1)
Sodium: 142 mmol/L (ref 135–145)
Total Bilirubin: 0.6 mg/dL (ref 0.3–1.2)
Total Protein: 7.8 g/dL (ref 6.5–8.1)

## 2020-02-09 LAB — I-STAT BETA HCG BLOOD, ED (MC, WL, AP ONLY): I-stat hCG, quantitative: <5 m[IU]/mL (ref <5)

## 2020-02-09 LAB — LIPASE, BLOOD: Lipase: 23 U/L (ref 11–51)

## 2020-02-09 MED ORDER — PROMETHAZINE HCL 12.5 MG PO TABS: 12.5000 mg | ORAL_TABLET | Freq: Four times a day (QID) | ORAL | 0 refills | Status: DC | PRN

## 2020-02-09 MED ORDER — METOCLOPRAMIDE HCL 10 MG PO TABS: 10.0000 mg | ORAL_TABLET | Freq: Four times a day (QID) | ORAL | 0 refills | Status: DC

## 2020-02-09 MED ORDER — FAMOTIDINE IN NACL 20-0.9 MG/50ML-% IV SOLN
20.0000 mg | Freq: Once | INTRAVENOUS | Status: AC
  Administered 2020-02-09: 20 mg via INTRAVENOUS
  Filled 2020-02-09: qty 50

## 2020-02-09 MED ORDER — PROMETHAZINE HCL 25 MG/ML IJ SOLN
25.0000 mg | Freq: Once | INTRAMUSCULAR | Status: AC
  Administered 2020-02-09: 25 mg via INTRAVENOUS
  Filled 2020-02-09: qty 1

## 2020-02-09 MED ORDER — SODIUM CHLORIDE 0.9 % IV BOLUS
1000.0000 mL | Freq: Once | INTRAVENOUS | Status: AC
  Administered 2020-02-09: 1000 mL via INTRAVENOUS

## 2020-02-09 NOTE — ED Provider Notes (Signed)
[facility] DEPT Provider Note   CSN: [id number] Arrival date & time: 02/09/20  5697     History Chief Complaint  Patient presents with  . Abdominal Pain    Allison Leonard is a 44 y.o. female with cyclical vomiting syndrome, celiac disease, GERD presents with N/V. She states that symptoms started 3-4 days ago. She's had intractable N/V along with generalized abdominal pain. She has had an extensive work up by [facility] and has been seen in the ED multiple times. She states she quit smoking marijuana but also states her brother got married over the weekend and she did smoke some then. She denies fever, chills, chest pain, SOB, diarrhea/constipation, urinary symptoms. She states she not longer has periods. She also ran out of her prescribed phenergan and reglan.  HPI   Past Medical History:  Diagnosis Date  . Acid reflux 07/04/2018  . Anemia   . Celiac disease/sprue   . Cyclic vomiting syndrome   . Generalized anxiety disorder 07/04/2018  . History of colon polyps    last colonoscopy 2018 removed multiple benign polyps  . Migraines 07/04/2018    Patient Active Problem List   Diagnosis Date Noted  . Cyclic vomiting syndrome 09/17/2019  . Patient underweight 05/14/2019  . Cannabis abuse 11/19/2018  . Cannabinoid hyperemesis syndrome 10/27/2018  . Acid reflux 07/04/2018  . Migraines 07/04/2018    Past Surgical History:  Procedure Laterality Date  . ABDOMINAL HYSTERECTOMY  2006  . APPENDECTOMY  1999  . COLONOSCOPY  2018  . ESOPHAGOGASTRODUODENOSCOPY  2018     OB History   No obstetric history on file.     Family History  Problem Relation Age of Onset  . Diabetes Father   . Hypertension Father   . Hyperlipidemia Father   . Colon polyps Father   . Heart attack Maternal Grandmother   . Heart attack Paternal Grandmother   . Cervical cancer Maternal Aunt   . Throat cancer Maternal Uncle   . Stroke Maternal Uncle   . Down  syndrome Son   . Colon cancer Neg Hx     Social History   Tobacco Use  . Smoking status: Current Some Day Smoker    Packs/day: 0.50    Years: 7.00    Pack years: 3.50    Types: Cigarettes  . Smokeless tobacco: Never Used  Substance Use Topics  . Alcohol use: Not Currently  . Drug use: Not Currently    Types: Marijuana    Comment: quit a month ago per patient    Home Medications Prior to Admission medications   Medication Sig Start Date End Date Taking? Authorizing Provider  aspirin-acetaminophen-caffeine (EXCEDRIN MIGRAINE) [phone] MG tablet Take 1 tablet by mouth every 4 (four) hours as needed for headache.    [provider]  dicyclomine (BENTYL) 20 MG tablet TAKE 1 TABLET (20 MG TOTAL) BY MOUTH 4 (FOUR) TIMES DAILY - BEFORE MEALS AND AT BEDTIME. 06/08/19   Thornton Park, MD  LORazepam (ATIVAN) 0.5 MG tablet Take 2 tablets (1 mg total) by mouth 3 (three) times daily as needed (nausea and vomiting). 05/07/19   Tegeler, Gwenyth Allegra, MD  metoCLOPramide (REGLAN) 10 MG tablet Take 1 tablet (10 mg total) by mouth every 8 (eight) hours as needed for nausea or vomiting. 12/14/19   Brimage, Ronnette Juniper, DO  pantoprazole (PROTONIX) 20 MG tablet TAKE 1 TABLET BY MOUTH EVERY DAY 12/01/19   Brimage, Ronnette Juniper, DO  promethazine (PHENERGAN) 12.5 MG tablet TAKE  1 TABLET (12.5 MG TOTAL) BY MOUTH EVERY 12 (TWELVE) HOURS AS NEEDED FOR NAUSEA OR VOMITING. 02/05/20   Noralyn Pick, NP  SUMAtriptan (IMITREX) 100 MG tablet Take 1 tablet earliest onset of migraine.  May repeat in 2 hours if headache persists or recurs.  Maximum 2 tablets in 24 hours 12/18/19   Pieter Partridge, DO  topiramate (TOPAMAX) 25 MG tablet Take 1 tablet at bedtime for a week, then increase to 2 tablets at bedtime 12/18/19   Metta Clines R, DO  pantoprazole (PROTONIX) 20 MG tablet Take 1 tablet (20 mg total) by mouth daily. 09/16/19   Caroline More, DO  prochlorperazine (COMPAZINE) 10 MG tablet Take 1 tablet (10 mg total)  by mouth every 8 (eight) hours as needed for nausea or vomiting. Patient not taking: Reported on 05/07/2019 02/20/19 09/11/19  Duffy Bruce, MD    Allergies    Zofran Alvis Lemmings hcl] and Morphine and related  Review of Systems   Review of Systems  Constitutional: Negative for chills and fever.  Respiratory: Negative for cough and shortness of breath.   Cardiovascular: Negative for chest pain.  Gastrointestinal: Positive for abdominal pain, nausea and vomiting. Negative for constipation and diarrhea.  Genitourinary: Negative for dysuria and flank pain.  All other systems reviewed and are negative.   Physical Exam Updated Vital Signs BP 132/74 (BP Location: Right Arm)   Pulse 86   Temp 98.2 F (36.8 C)   Resp 18   Ht 5' 2"  (1.575 m)   Wt 49 kg   SpO2 96%   BMI 19.76 kg/m   Physical Exam Vitals and nursing note reviewed.  Constitutional:      General: She is not in acute distress.    Appearance: Normal appearance. She is well-developed. She is not ill-appearing.  HENT:     Head: Normocephalic and atraumatic.  Eyes:     General: No scleral icterus.       Right eye: No discharge.        Left eye: No discharge.     Conjunctiva/sclera: Conjunctivae normal.     Pupils: Pupils are equal, round, and reactive to light.  Cardiovascular:     Rate and Rhythm: Normal rate and regular rhythm.  Pulmonary:     Effort: Pulmonary effort is normal. No respiratory distress.     Breath sounds: Normal breath sounds.  Abdominal:     General: There is no distension.     Palpations: Abdomen is soft.     Tenderness: There is abdominal tenderness (generalized).  Musculoskeletal:     Cervical back: Normal range of motion.  Skin:    General: Skin is warm and dry.  Neurological:     Mental Status: She is alert and oriented to person, place, and time.  Psychiatric:        Behavior: Behavior normal.     ED Results / Procedures / Treatments   Labs (all labs ordered are listed, but only  abnormal results are displayed) Labs Reviewed  CBC WITH DIFFERENTIAL/PLATELET - Abnormal; Notable for the following components:      Result Value   Neutro Abs 8.1 (*)    All other components within normal limits  COMPREHENSIVE METABOLIC PANEL - Abnormal; Notable for the following components:   CO2 21 (*)    Glucose, Bld 129 (*)    All other components within normal limits  URINALYSIS, ROUTINE W REFLEX MICROSCOPIC - Abnormal; Notable for the following components:   Ketones, ur 80 (*)  Leukocytes,Ua TRACE (*)    Bacteria, UA MANY (*)    All other components within normal limits  RAPID URINE DRUG SCREEN, HOSP PERFORMED - Abnormal; Notable for the following components:   Tetrahydrocannabinol POSITIVE (*)    All other components within normal limits  URINE CULTURE  LIPASE, BLOOD  I-STAT BETA HCG BLOOD, ED ([facility], [facility], [facility] ONLY)    EKG None  Radiology No results found.  Procedures Procedures (including critical care time)  Medications Ordered in ED Medications  sodium chloride 0.9 % bolus 1,000 mL (0 mLs Intravenous Stopped 02/09/20 0918)  promethazine (PHENERGAN) injection 25 mg (25 mg Intravenous Given 02/09/20 0807)  famotidine (PEPCID) IVPB 20 mg premix (0 mg Intravenous Stopped 02/09/20 0837)    ED Course  I have reviewed the triage vital signs and the nursing notes.  Pertinent labs & imaging results that were available during my care of the patient were reviewed by me and considered in my medical decision making (see chart for details).  44 year old female with cyclical vomiting syndrome presents with abdominal pain and nausea and vomiting after running out of her medicines and smoking marijuana over the weekend.  She is requesting IV medications.  Her vital signs are normal.  Heart is regular rate and rhythm.  Lungs are clear to auscultation.  Abdomen is soft but generally tender.  She is somewhat difficult to examine and curled up in the fetal position on the stretcher.  Will  obtain basic labs, urine, drug screen and provide fluids, Phenergan, Pepcid  Labs are normal.  UDS positive for marijuana.  UA has 80 ketones, many bacteria, trace leukocytes.  Patient denies any urinary symptoms.  Will defer treatment and send off a urine culture.  She feels better after meds.  She was p.o. challenged and tolerated well.  Will prescribe Phenergan and Reglan for her and it was courage to follow-up with GI.  MDM Rules/Calculators/A&P                       Final Clinical Impression(s) / ED Diagnoses Final diagnoses:  Cyclical vomiting syndrome    Rx / DC Orders ED Discharge Orders    None       Recardo Evangelist, PA-C 02/09/20 1213    Lacretia Leigh, MD 02/11/20 1302

## 2020-02-09 NOTE — ED Notes (Signed)
Pt provided with water and graham crackers.

## 2020-02-09 NOTE — ED Triage Notes (Signed)
As per EMS pt has Abd x 5 days. Pt ran out of phenergan

## 2020-02-09 NOTE — Discharge Instructions (Signed)
Take Phenergan or Reglan for nausea/vomiting Please drink plenty of fluids Return if you are worsening Follow up with your GI doctor

## 2020-02-10 LAB — URINE CULTURE

## 2020-02-11 DIAGNOSIS — R000 Tachycardia, unspecified: Secondary | ICD-10-CM | POA: Diagnosis not present

## 2020-02-11 DIAGNOSIS — E162 Hypoglycemia, unspecified: Secondary | ICD-10-CM | POA: Diagnosis not present

## 2020-02-11 DIAGNOSIS — R1084 Generalized abdominal pain: Secondary | ICD-10-CM | POA: Diagnosis not present

## 2020-02-11 DIAGNOSIS — E161 Other hypoglycemia: Secondary | ICD-10-CM | POA: Diagnosis not present

## 2020-02-11 DIAGNOSIS — R52 Pain, unspecified: Secondary | ICD-10-CM | POA: Diagnosis not present

## 2020-02-11 NOTE — Progress Notes (Signed)
    SUBJECTIVE:   CHIEF COMPLAINT / HPI:   CC: "discuss my medications"  Allison Leonard is a 44 y.o. female here for medication management.     Nausea and Vomiting  Patient states she was just discharged from the ED for persistent nausea and vomiting this morning. Additionally, had been seen in the ED earlier this week for similar complaints. Taking Reglan, phenergan and bentyl without relief.  States phenergan is making her nauseous and has to switch anti-emetics from time to time. Has upcoming appointment with GI. Last vomiting early this morning while in the ED.  Reports blood streaked emesis.  Denies abdominal pain, hematochezia and melena.       PERTINENT  PMH / PSH: cyclic vomiting syndrome, hx of THC hyperemesis   OBJECTIVE:   BP 128/88   Pulse (!) 137   Ht 5' 6.5" (1.689 m)   Wt 101 lb 3.2 oz (45.9 kg)   SpO2 97%   BMI 16.09 kg/m    GEN: thin female in no acute distress  HENT: mucus membranes moist  CV: tachycardic, regular rhythm RESP: no increased work of breathing, clear to ascultation bilaterally  ABD: Bowel sounds present. Soft, Nontender, Nondistended. SKIN: warm, dry, normal skin turgor     ASSESSMENT/PLAN:   Cyclic vomiting syndrome Continue Reglan. Switch phenergan to compazine and refill Ativan. PDMP reviewed. Patient to follow up with GI on 01/24/20. Recent ED notes reviewed.  Pt contines to be tachycardiac however received fluids in ED and there is no evidence of dehydration on exam.   - Compazine 20m TID prn  - Ativan 0.552mTID  - Consider low dose opioid, ativan and antiemetic cocktail if symptoms do not improve      VoLyndee HensenDO [place]

## 2020-02-12 ENCOUNTER — Other Ambulatory Visit: Payer: Self-pay

## 2020-02-12 ENCOUNTER — Encounter (HOSPITAL_COMMUNITY): Payer: Self-pay

## 2020-02-12 ENCOUNTER — Emergency Department (HOSPITAL_COMMUNITY)
Admission: EM | Admit: 2020-02-12 | Discharge: 2020-02-12 | Disposition: A | Discharge status: Home / Self Care | Payer: Medicaid Other | Attending: Emergency Medicine | Admitting: Emergency Medicine

## 2020-02-12 ENCOUNTER — Encounter: Payer: Self-pay | Admitting: Family Medicine

## 2020-02-12 ENCOUNTER — Ambulatory Visit (INDEPENDENT_AMBULATORY_CARE_PROVIDER_SITE_OTHER): Payer: Medicaid Other | Admitting: Family Medicine

## 2020-02-12 VITALS — BP 128/88 | HR 137 | Ht 66.5 in | Wt 101.2 lb

## 2020-02-12 DIAGNOSIS — Z79899 Other long term (current) drug therapy: Secondary | ICD-10-CM | POA: Diagnosis not present

## 2020-02-12 DIAGNOSIS — R1084 Generalized abdominal pain: Secondary | ICD-10-CM | POA: Insufficient documentation

## 2020-02-12 DIAGNOSIS — R1115 Cyclical vomiting syndrome unrelated to migraine: Secondary | ICD-10-CM

## 2020-02-12 DIAGNOSIS — F17210 Nicotine dependence, cigarettes, uncomplicated: Secondary | ICD-10-CM | POA: Insufficient documentation

## 2020-02-12 DIAGNOSIS — R1110 Vomiting, unspecified: Secondary | ICD-10-CM | POA: Diagnosis present

## 2020-02-12 LAB — LIPASE, BLOOD: Lipase: 30 U/L (ref 11–51)

## 2020-02-12 LAB — COMPREHENSIVE METABOLIC PANEL
ALT: 17 U/L (ref 0–44)
AST: 19 U/L (ref 15–41)
Albumin: 5.1 g/dL — ABNORMAL HIGH (ref 3.5–5.0)
Alkaline Phosphatase: 71 U/L (ref 38–126)
Anion gap: 17 — ABNORMAL HIGH (ref 5–15)
BUN: 10 mg/dL (ref 6–20)
CO2: 13 mmol/L — ABNORMAL LOW (ref 22–32)
Calcium: 9.7 mg/dL (ref 8.9–10.3)
Chloride: 106 mmol/L (ref 98–111)
Creatinine, Ser: 0.59 mg/dL (ref 0.44–1.00)
GFR calc Af Amer: >60 mL/min (ref >60)
GFR calc non Af Amer: >60 mL/min (ref >60)
Glucose, Bld: 116 mg/dL — ABNORMAL HIGH (ref 70–99)
Potassium: 4 mmol/L (ref 3.5–5.1)
Sodium: 136 mmol/L (ref 135–145)
Total Bilirubin: 1 mg/dL (ref 0.3–1.2)
Total Protein: 9 g/dL — ABNORMAL HIGH (ref 6.5–8.1)

## 2020-02-12 LAB — I-STAT BETA HCG BLOOD, ED (MC, WL, AP ONLY): I-stat hCG, quantitative: <5 m[IU]/mL (ref <5)

## 2020-02-12 LAB — CBC
HCT: 48.6 % — ABNORMAL HIGH (ref 36.0–46.0)
Hemoglobin: 15.2 g/dL — ABNORMAL HIGH (ref 12.0–15.0)
MCH: 26.7 pg (ref 26.0–34.0)
MCHC: 31.3 g/dL (ref 30.0–36.0)
MCV: 85.4 fL (ref 80.0–100.0)
Platelets: 305 10*3/uL (ref 150–400)
RBC: 5.69 MIL/uL — ABNORMAL HIGH (ref 3.87–5.11)
RDW: 13.8 % (ref 11.5–15.5)
WBC: 12.1 10*3/uL — ABNORMAL HIGH (ref 4.0–10.5)
nRBC: 0 % (ref 0.0–0.2)

## 2020-02-12 MED ORDER — ALUM & MAG HYDROXIDE-SIMETH 200-200-20 MG/5ML PO SUSP
30.0000 mL | Freq: Once | ORAL | Status: AC
  Administered 2020-02-12: 30 mL via ORAL
  Filled 2020-02-12: qty 30

## 2020-02-12 MED ORDER — SODIUM CHLORIDE 0.9 % IV BOLUS
1000.0000 mL | Freq: Once | INTRAVENOUS | Status: AC
  Administered 2020-02-12: 02:00:00 1000 mL via INTRAVENOUS

## 2020-02-12 MED ORDER — METOCLOPRAMIDE HCL 5 MG/ML IJ SOLN
10.0000 mg | Freq: Once | INTRAMUSCULAR | Status: AC
  Administered 2020-02-12: 10 mg via INTRAVENOUS
  Filled 2020-02-12: qty 2

## 2020-02-12 MED ORDER — LIDOCAINE VISCOUS HCL 2 % MT SOLN
15.0000 mL | Freq: Once | OROMUCOSAL | Status: AC
  Administered 2020-02-12: 03:00:00 15 mL via ORAL
  Filled 2020-02-12: qty 15

## 2020-02-12 MED ORDER — PROCHLORPERAZINE MALEATE 10 MG PO TABS: 10.0000 mg | ORAL_TABLET | Freq: Three times a day (TID) | ORAL | 0 refills | Status: DC | PRN

## 2020-02-12 MED ORDER — LIDOCAINE VISCOUS HCL 2 % MT SOLN: 15.0000 mL | Freq: Four times a day (QID) | OROMUCOSAL | 0 refills | Status: DC | PRN

## 2020-02-12 MED ORDER — LORAZEPAM 0.5 MG PO TABS: 1.0000 mg | ORAL_TABLET | Freq: Three times a day (TID) | ORAL | 0 refills | Status: DC | PRN

## 2020-02-12 MED ORDER — HYOSCYAMINE SULFATE 0.125 MG SL SUBL
0.2500 mg | SUBLINGUAL_TABLET | Freq: Once | SUBLINGUAL | Status: AC
  Administered 2020-02-12: 02:00:00 0.25 mg via SUBLINGUAL
  Filled 2020-02-12: qty 2

## 2020-02-12 MED ORDER — HYOSCYAMINE SULFATE SL 0.125 MG SL SUBL: 1.0000 | SUBLINGUAL_TABLET | Freq: Four times a day (QID) | SUBLINGUAL | 0 refills | Status: DC | PRN

## 2020-02-12 NOTE — ED Triage Notes (Signed)
Pt c/o vomiting since Sunday. Generalized abdominal pain. Pt arrives with hr in upper 120's.

## 2020-02-12 NOTE — ED Provider Notes (Signed)
[facility] DEPT Provider Note  CSN: [id number] Arrival date & time: 02/12/20 0010  Chief Complaint(s) Emesis  HPI Allison Leonard is a 44 y.o. female with a past medical history listed below including celiac disease cyclical vomiting syndrome/cannabinoid hyperemesis syndrome who presents to the emergency department with several days of generalized abdominal pain with nausea and nonbloody nonbilious emesis with decreased oral tolerance.  Patient was seen here 3 days ago for the same and treated symptomatically.  Work-up at that time was reassuring patient was discharged home with her prescription for Phenergan and Reglan.  Patient reports that she has been unable to keep her medication down.  There is no associated fevers chills.  No chest pain or shortness of breath.  No diarrhea.  No urinary symptoms.  No other physical complaints.  She denies any known suspicious food intake or sick contacts.  Patient reports that she quit using marijuana at the end of March.  Her UDS on February 6 was positive for THC.  HPI  Past Medical History Past Medical History:  Diagnosis Date  . Acid reflux 07/04/2018  . Anemia   . Celiac disease/sprue   . Cyclic vomiting syndrome   . Generalized anxiety disorder 07/04/2018  . History of colon polyps    last colonoscopy 2018 removed multiple benign polyps  . Migraines 07/04/2018   Patient Active Problem List   Diagnosis Date Noted  . Cyclic vomiting syndrome 09/17/2019  . Patient underweight 05/14/2019  . Cannabis abuse 11/19/2018  . Cannabinoid hyperemesis syndrome 10/27/2018  . Acid reflux 07/04/2018  . Migraines 07/04/2018   Home Medication(s) Prior to Admission medications   Medication Sig Start Date End Date Taking? Authorizing Provider  dicyclomine (BENTYL) 20 MG tablet TAKE 1 TABLET (20 MG TOTAL) BY MOUTH 4 (FOUR) TIMES DAILY - BEFORE MEALS AND AT BEDTIME. Patient taking differently: Take 20 mg by mouth 3  (three) times daily as needed for spasms.  06/08/19  Yes Thornton Park, MD  metoCLOPramide (REGLAN) 10 MG tablet Take 1 tablet (10 mg total) by mouth every 6 (six) hours. 02/09/20  Yes Recardo Evangelist, PA-C  promethazine (PHENERGAN) 12.5 MG tablet Take 1 tablet (12.5 mg total) by mouth every 6 (six) hours as needed for nausea or vomiting. 02/09/20  Yes Recardo Evangelist, PA-C  SUMAtriptan (IMITREX) 100 MG tablet Take 1 tablet earliest onset of migraine.  May repeat in 2 hours if headache persists or recurs.  Maximum 2 tablets in 24 hours 12/18/19  Yes Jaffe, Adam R, DO  topiramate (TOPAMAX) 25 MG tablet Take 1 tablet at bedtime for a week, then increase to 2 tablets at bedtime Patient taking differently: Take 50 mg by mouth at bedtime as needed (migraine).  12/18/19  Yes Jaffe, Adam R, DO  Hyoscyamine Sulfate SL (LEVSIN/SL) 0.125 MG SUBL Place 1 each under the tongue 4 (four) times daily as needed for up to 5 days. 02/12/20 02/17/20  Fatima Blank, MD  lidocaine (XYLOCAINE) 2 % solution Use as directed 15 mLs in the mouth or throat every 6 (six) hours as needed for mouth pain. 02/12/20   Appolonia Ackert, Grayce Sessions, MD  LORazepam (ATIVAN) 0.5 MG tablet Take 2 tablets (1 mg total) by mouth 3 (three) times daily as needed (nausea and vomiting). Patient not taking: Reported on 02/12/2020 05/07/19   Tegeler, Gwenyth Allegra, MD  pantoprazole (PROTONIX) 20 MG tablet TAKE 1 TABLET BY MOUTH EVERY DAY Patient not taking: No sig reported 12/01/19   Brimage,  Vondra, DO  pantoprazole (PROTONIX) 20 MG tablet Take 1 tablet (20 mg total) by mouth daily. 09/16/19   Caroline More, DO  prochlorperazine (COMPAZINE) 10 MG tablet Take 1 tablet (10 mg total) by mouth every 8 (eight) hours as needed for nausea or vomiting. Patient not taking: Reported on 05/07/2019 02/20/19 09/11/19  Duffy Bruce, MD                                                                                                                                     Past Surgical History Past Surgical History:  Procedure Laterality Date  . ABDOMINAL HYSTERECTOMY  2006  . APPENDECTOMY  1999  . COLONOSCOPY  2018  . ESOPHAGOGASTRODUODENOSCOPY  2018   Family History Family History  Problem Relation Age of Onset  . Diabetes Father   . Hypertension Father   . Hyperlipidemia Father   . Colon polyps Father   . Heart attack Maternal Grandmother   . Heart attack Paternal Grandmother   . Cervical cancer Maternal Aunt   . Throat cancer Maternal Uncle   . Stroke Maternal Uncle   . Down syndrome Son   . Colon cancer Neg Hx     Social History Social History   Tobacco Use  . Smoking status: Current Some Day Smoker    Packs/day: 0.50    Years: 7.00    Pack years: 3.50    Types: Cigarettes  . Smokeless tobacco: Never Used  Substance Use Topics  . Alcohol use: Not Currently  . Drug use: Not Currently    Types: Marijuana    Comment: quit a month ago per patient   Allergies Zofran [ondansetron hcl] and Morphine and related  Review of Systems Review of Systems All other systems are reviewed and are negative for acute change except as noted in the HPI  Physical Exam Vital Signs  I have reviewed the triage vital signs BP (!) 156/116 (BP Location: Right Arm)   Pulse (!) 140   Temp 98.1 F (36.7 C) (Oral)   Resp 20   Ht 5' 4"  (1.626 m)   Wt 48.1 kg   SpO2 98%   BMI 18.19 kg/m   Physical Exam Vitals reviewed.  Constitutional:      General: She is not in acute distress.    Appearance: She is well-developed. She is not diaphoretic.  HENT:     Head: Normocephalic and atraumatic.     Right Ear: External ear normal.     Left Ear: External ear normal.     Nose: Nose normal.  Eyes:     General: No scleral icterus.    Conjunctiva/sclera: Conjunctivae normal.  Neck:     Trachea: Phonation normal.  Cardiovascular:     Rate and Rhythm: Regular rhythm. Tachycardia present.  Pulmonary:     Effort: Pulmonary effort is normal. No  respiratory distress.     Breath sounds: No stridor.  Abdominal:  General: There is no distension.     Tenderness: There is generalized abdominal tenderness. There is guarding. There is no rebound.  Musculoskeletal:        General: Normal range of motion.     Cervical back: Normal range of motion.  Neurological:     Mental Status: She is alert and oriented to person, place, and time.  Psychiatric:        Behavior: Behavior normal.     ED Results and Treatments Labs (all labs ordered are listed, but only abnormal results are displayed) Labs Reviewed  COMPREHENSIVE METABOLIC PANEL - Abnormal; Notable for the following components:      Result Value   CO2 13 (*)    Glucose, Bld 116 (*)    Total Protein 9.0 (*)    Albumin 5.1 (*)    Anion gap 17 (*)    All other components within normal limits  CBC - Abnormal; Notable for the following components:   WBC 12.1 (*)    RBC 5.69 (*)    Hemoglobin 15.2 (*)    HCT 48.6 (*)    All other components within normal limits  LIPASE, BLOOD  URINALYSIS, ROUTINE W REFLEX MICROSCOPIC  I-STAT BETA HCG BLOOD, ED ([facility], [facility], [facility] ONLY)                                                                                                                         EKG  EKG Interpretation  Date/Time:    Ventricular Rate:    PR Interval:    QRS Duration:   QT Interval:    QTC Calculation:   R Axis:     Text Interpretation:        Radiology No results found.  Pertinent labs & imaging results that were available during my care of the patient were reviewed by me and considered in my medical decision making (see chart for details).  Medications Ordered in ED Medications  sodium chloride 0.9 % bolus 1,000 mL (1,000 mLs Intravenous New Bag/Given 02/12/20 0143)  metoCLOPramide (REGLAN) injection 10 mg (10 mg Intravenous Given 02/12/20 0215)  alum & mag hydroxide-simeth (MAALOX/MYLANTA) 200-200-20 MG/5ML suspension 30 mL (30 mLs Oral Given 02/12/20 0313)     And  lidocaine (XYLOCAINE) 2 % viscous mouth solution 15 mL (15 mLs Oral Given 02/12/20 0313)  hyoscyamine (LEVSIN SL) SL tablet 0.25 mg (0.25 mg Sublingual Given 02/12/20 0215)  Procedures .1-3 Lead EKG Interpretation Performed by: Fatima Blank, MD Authorized by: Fatima Blank, MD     Interpretation: abnormal     ECG rate:  112   ECG rate assessment: tachycardic     Rhythm: sinus rhythm     Ectopy: none     Conduction: normal      (including critical care time)  Medical Decision Making / ED Course I have reviewed the nursing notes for this encounter and the patient's prior records (if available in EHR or on provided paperwork).   Allison Leonard was evaluated in Emergency Department on 02/12/2020 for the symptoms described in the history of present illness. She was evaluated in the context of the global COVID-19 pandemic, which necessitated consideration that the patient might be at risk for infection with the SARS-CoV-2 virus that causes COVID-19. Institutional protocols and algorithms that pertain to the evaluation of patients at risk for COVID-19 are in a state of rapid change based on information released by regulatory bodies including the CDC and federal and state organizations. These policies and algorithms were followed during the patient's care in the ED.  Repeat labs drawn in triage showed a CBC consistent with hemoconcentration, otherwise labs are grossly reassuring.  Patient has diffuse abdominal discomfort without evidence of peritonitis.  Patient is tachycardic and has evidence of dehydration on exam.  Will provide patient with IV fluids, antiemetics and GI cocktail.  We will reassess.   5:02 AM Following IV fluids, Reglan and GI cocktail patient had significant relief.  Able to tolerate oral intake.      Final  Clinical Impression(s) / ED Diagnoses Final diagnoses:  Cyclic vomiting syndrome    The patient appears reasonably screened and/or stabilized for discharge and I doubt any other medical condition or other [facility] requiring further screening, evaluation, or treatment in the ED at this time prior to discharge. Safe for discharge with strict return precautions.  Disposition: Discharge  Condition: Good  I have discussed the results, Dx and Tx plan with the patient/family who expressed understanding and agree(s) with the plan. Discharge instructions discussed at length. The patient/family was given strict return precautions who verbalized understanding of the instructions. No further questions at time of discharge.    ED Discharge Orders         Ordered    Hyoscyamine Sulfate SL (LEVSIN/SL) 0.125 MG SUBL  4 times daily PRN     02/12/20 0501    lidocaine (XYLOCAINE) 2 % solution  Every 6 hours PRN     02/12/20 0501            Follow Up: Primary care provider  Schedule an appointment as soon as possible for a visit  As needed    This chart was dictated using voice recognition software.  Despite best efforts to proofread,  errors can occur which can change the documentation meaning.   Fatima Blank, MD 02/12/20 [phone]

## 2020-02-12 NOTE — ED Notes (Signed)
Patient refused to provide a urine sample

## 2020-02-12 NOTE — Patient Instructions (Signed)
It was great seeing you today!   I'd like to see you back for your yearly check up but if you need to be seen earlier than that for any new issues we're happy to fit you in, just give [place] a call!  - Stop by the pharmacy to pick up your medications.   If you have questions or concerns please do not hesitate to call at [phone].  Dr. Rushie [facility]

## 2020-02-17 NOTE — Assessment & Plan Note (Signed)
Continue Reglan. Switch phenergan to compazine and refill Ativan. PDMP reviewed. Patient to follow up with GI on 01/24/20. Recent ED notes reviewed.  Pt contines to be tachycardiac however received fluids in ED and there is no evidence of dehydration on exam.   - Compazine 44m TID prn  - Ativan 0.558mTID  - Consider low dose opioid, ativan and antiemetic cocktail if symptoms do not improve

## 2020-02-19 ENCOUNTER — Other Ambulatory Visit: Payer: Self-pay | Admitting: Neurology

## 2020-02-24 ENCOUNTER — Ambulatory Visit: Payer: Medicaid Other | Admitting: Nurse Practitioner

## 2020-03-15 NOTE — Progress Notes (Signed)
03/15/2020 Allison Leonard [id number] Jun 26, 1976   Chief Complaint: N/V upper abdominal pain  History of Present Illness: History of Present Illness: Allison Leonard is a 44 year old female with a past medical history of anxiety, migraine headaches, GERD, cyclic vomiting with cannabinoid hyperemesis syndrome component  and colon polyps. Past hysterectomy and appendectomy.  Last saw the patient in the office on 09/28/2019 with nausea/vomiting and diarrhea.  Gastric emptying study was ordered but was incomplete as she could not tolerate eating the eggs which were prepared for this study.  Abdominal/pelvic CT angiogram was negative.  She was to follow-up in the office in 4 to 6 weeks.  He presents to our office today with recurrence of her nausea/vomiting and upper abdominal pain. She awakened at approximately 3 AM 02/06/2020 with nausea, vomiting and sweats.  She reported vomiting partially digested food.  No hematemesis.  She vomited 2-3 times that night.  She reported taking Promethazine, Phenergan and Reglan intermittently over the next 3 days without improvement.  She continued to vomit with worsening epigastric pain. She presented to [facility] ED on 02/09/2020 for further evaluation.  A urine drug screen was + for marijuana, all other labs were normal. She was prescribed Phenergan and Reglan and she was discharged home. She presented back to [facility] ED on 02/12/2020 with the same symptoms. She received IV fluids, Reglan and a GI cocktail and her symptoms significantly improved. She was prescribed Hyoscyamine  0.129m Sl one tab qid PRN and Lidocaine 2% solution.  Since that time, she is controlling her nausea and vomiting by taking Reglan 10 mg once daily, Compazine 10 mg once daily and promethazine 12.5 mg once daily.  She takes the antiemetics at different times throughout the day and at bedtime.  She stated Phenergan suppositories were ineffective when she tried to use them in the past and  resulted in stimulating a bowel movement which was not favorable during the time of her active nausea and vomiting.  She reports having daily heartburn for years.  She is taking Pantoprazole 20 mg daily.  She is taking Famotidine 20 mg once daily as well.  No NSAID use.  She is having fairly soft to formed bowel movements daily.  No rectal bleeding or melena.  Diarrhea.She reports having a good appetite and eats all foods.  Her weight fluctuates, she reports weight has dropped as low as 2 100 pounds.  She is up to her baseline weight of 106 pounds today. No fevers.  She has significantly reduced her marijuana use.  She is smoking marijuana once or twice weekly at this point. As previously reviewed, her last EGD was 03/28/2014 which was normal.  Her most recent colonoscopy was 04/16/2017 and a sessile hyperplastic polyp was removed.   She was advised to repeat a colonoscopy June 2021.  Her sister was recently diagnosed with stage IV colon cancer at the age of 459  She wishes to schedule a follow-up colonoscopy at this time.   CBC Latest Ref Rng & Units 02/12/2020 02/09/2020 10/17/2019  WBC 4.0 - 10.5 K/uL 12.1(H) 9.9 11.9(H)  Hemoglobin 12.0 - 15.0 g/dL 15.2(H) 12.6 15.0  Hematocrit 36.0 - 46.0 % 48.6(H) 40.9 49.8(H)  Platelets 150 - 400 K/uL 305 252 291   CMP Latest Ref Rng & Units 02/12/2020 02/09/2020 10/17/2019  Glucose 70 - 99 mg/dL 116(H) 129(H) 133(H)  BUN 6 - 20 mg/dL 10 15 20   Creatinine 0.44 - 1.00 mg/dL 0.59 0.54 0.67  Sodium  135 - 145 mmol/L 136 142 139  Potassium 3.5 - 5.1 mmol/L 4.0 3.7 3.7  Chloride 98 - 111 mmol/L 106 106 99  CO2 22 - 32 mmol/L 13(L) 21(L) 16(L)  Calcium 8.9 - 10.3 mg/dL 9.7 9.4 10.6(H)  Total Protein 6.5 - 8.1 g/dL 9.0(H) 7.8 9.9(H)  Total Bilirubin 0.3 - 1.2 mg/dL 1.0 0.6 1.0  Alkaline Phos 38 - 126 U/L 71 54 73  AST 15 - 41 U/L 19 17 24   ALT 0 - 44 U/L 17 12 18    Abdominal/pelvic CT angiogram 10/17/2019: No evidence of abdominal aortic aneurysm or dissection. No  evidence of mesenteric or renal artery stenosis is noted. No acute abnormality seen in the abdomen or pelvis.  Abdominal sonogram 05/07/2019: Gallbladder:No gallstones or wall thickening visualized. No sonographic Murphy sign noted by sonographer.Common bile duct:Diameter: 4 mm Liver:No focal lesion identified. Within normal limits in parenchymal echogenicity. Portal vein is patent on color Doppler imaging with normal direction of blood flow towards the liver.  Colonoscopy  in 2015 with Dr. Redmond Pulling in [place]: 5 tubular adenomatous polyps removed.  Colonoscopy 04/16/2017: A sessile hyperplastic polyp was found, measuring 56m in the sigmoid colon. Colon was otherwise normal. Surveillance colonoscopy recommended 04/2020   EGD 2015 - EGD 03/28/2014: The esophagus and the gastroesophageal junction were completely normal in appearance.  The duodenal bulb and postbulbar duodenum were normal.  duodenal biopsies were negative for celiac disease   Current Outpatient Medications on File Prior to Visit  Medication Sig Dispense Refill  . dicyclomine (BENTYL) 20 MG tablet TAKE 1 TABLET (20 MG TOTAL) BY MOUTH 4 (FOUR) TIMES DAILY - BEFORE MEALS AND AT BEDTIME. (Patient taking differently: Take 20 mg by mouth 3 (three) times daily as needed for spasms. ) 120 tablet 2  . lidocaine (XYLOCAINE) 2 % solution Use as directed 15 mLs in the mouth or throat every 6 (six) hours as needed for mouth pain. 100 mL 0  . LORazepam (ATIVAN) 0.5 MG tablet Take 2 tablets (1 mg total) by mouth 3 (three) times daily as needed (nausea and vomiting). 30 tablet 0  . metoCLOPramide (REGLAN) 10 MG tablet Take 1 tablet (10 mg total) by mouth every 6 (six) hours. 15 tablet 0  . pantoprazole (PROTONIX) 20 MG tablet TAKE 1 TABLET BY MOUTH EVERY DAY 30 tablet 1  . prochlorperazine (COMPAZINE) 10 MG tablet Take 1 tablet (10 mg total) by mouth every 8 (eight) hours as needed for nausea or vomiting. 30 tablet 0  . promethazine  (PHENERGAN) 12.5 MG tablet Take 1 tablet (12.5 mg total) by mouth every 6 (six) hours as needed for nausea or vomiting. 15 tablet 0  . SUMAtriptan (IMITREX) 100 MG tablet Take 1 tablet earliest onset of migraine.  May repeat in 2 hours if headache persists or recurs.  Maximum 2 tablets in 24 hours 10 tablet 2  . topiramate (TOPAMAX) 50 MG tablet Take 1 tablet (50 mg total) by mouth at bedtime as needed (migraine). 30 tablet 1  . Hyoscyamine Sulfate SL (LEVSIN/SL) 0.125 MG SUBL Place 1 each under the tongue 4 (four) times daily as needed for up to 5 days. 30 tablet 0  . [DISCONTINUED] pantoprazole (PROTONIX) 20 MG tablet Take 1 tablet (20 mg total) by mouth daily. 30 tablet 1   No current facility-administered medications on file prior to visit.   Allergies  Allergen Reactions  . Zofran [Ondansetron Hcl] Anaphylaxis  . Morphine And Related Itching    Current Medications, Allergies,  Past Medical History, Past Surgical History, Family History and Social History were reviewed in [employer] record.   Physical Exam: BP 98/70   Pulse 72   Temp 98.5 F (36.9 C)   Ht 5' 4"  (1.626 m)   Wt 106 lb 6.4 oz (48.3 kg)   BMI 18.26 kg/m   General: Thin 44 year old female in no acute distress. Head: Normocephalic and atraumatic. Eyes: No scleral icterus. Conjunctiva pink . Ears: Normal auditory acuity. Mouth: Dentition intact. No ulcers or lesions.  Lungs: Clear throughout to auscultation. Heart: Regular rate and rhythm, no murmur. Abdomen: Soft, nontender. Tenderness throughout the upper abdomen without rebound or guarding. Nondistended. No masses or hepatomegaly. Normal bowel sounds x 4 quadrants.  Rectal: Deferred.  Musculoskeletal: Symmetrical with no gross deformities. Extremities: No edema. Neurological: Alert oriented x 4. No focal deficits.  Psychological: Alert and cooperative. Normal mood and affect  Assessment and Recommendations:  71. 44 year old female with  recurrent N/V, epigastric pain and daily heartburn. -EGD benefits and risks discussed including risk with sedation, risk of bleeding, perforation and infection.  -Continue Pantoprazole 20 mg daily.  Increase Famotidine 20 mg twice daily for now. -Continue to  alternate Reglan, Compazine and Phenergan once daily as needed -Dicyclomine 20 mg 2-3 times daily as needed for abdominal pain (patient has supply) -If EGD negative, consider [facility] HDIA and re-attempt  gastric empty study with non egg component  -Stop marijuana use   2.  History of colon polyps (5 TA polyps in 2015 and 1 81m hyperplastic polyp 04/2027).   Sister recently diagnosed with stage IV colon cancer the age of 44 -Colonoscopy at the time of EGD,  benefits and risks discussed including risk with sedation, risk of bleeding, perforation and infection   Further follow-up to be determined after the above evaluation completed

## 2020-03-16 ENCOUNTER — Encounter: Payer: Self-pay | Admitting: Nurse Practitioner

## 2020-03-16 ENCOUNTER — Ambulatory Visit: Payer: Medicaid Other | Admitting: Nurse Practitioner

## 2020-03-16 VITALS — BP 98/70 | HR 72 | Temp 98.5°F | Ht 64.0 in | Wt 106.4 lb

## 2020-03-16 DIAGNOSIS — R1115 Cyclical vomiting syndrome unrelated to migraine: Secondary | ICD-10-CM | POA: Diagnosis not present

## 2020-03-16 DIAGNOSIS — Z86010 Personal history of colonic polyps: Secondary | ICD-10-CM | POA: Diagnosis not present

## 2020-03-16 DIAGNOSIS — Z800 Family history of malignant neoplasm of digestive organs: Secondary | ICD-10-CM | POA: Diagnosis not present

## 2020-03-16 DIAGNOSIS — K219 Gastro-esophageal reflux disease without esophagitis: Secondary | ICD-10-CM | POA: Diagnosis not present

## 2020-03-16 DIAGNOSIS — R1013 Epigastric pain: Secondary | ICD-10-CM | POA: Diagnosis not present

## 2020-03-16 MED ORDER — NA SULFATE-K SULFATE-MG SULF 17.5-3.13-1.6 GM/177ML PO SOLN: 1.0000 | Freq: Once | ORAL | 0 refills | Status: AC

## 2020-03-16 MED ORDER — FAMOTIDINE 20 MG PO TABS: 20.0000 mg | ORAL_TABLET | Freq: Two times a day (BID) | ORAL | 0 refills | Status: DC

## 2020-03-16 NOTE — Progress Notes (Signed)
Reviewed and agree with management plans. ? ?Zoran Yankee L. Sreeja Spies, MD, MPH  ?

## 2020-03-16 NOTE — Patient Instructions (Addendum)
If you are age 44 or older, your body mass index should be between 23-30. Your Body mass index is 18.26 kg/m. If this is out of the aforementioned range listed, please consider follow up with your Primary Care Provider.  If you are age 20 or younger, your body mass index should be between 19-25. Your Body mass index is 18.26 kg/m. If this is out of the aformentioned range listed, please consider follow up with your Primary Care Provider.   We have sent the following medications to your pharmacy for you to pick up at your convenience:  suprep Famotidine 20 mg twice daily  No marijuanna  Is recommended  Call the office if your symptoms worsen  Due to recent changes in healthcare laws, you may see the results of your imaging and laboratory studies on MyChart before your provider has had a chance to review them.  We understand that in some cases there may be results that are confusing or concerning to you. Not all laboratory results come back in the same time frame and the provider may be waiting for multiple results in order to interpret others.  Please give [place] 48 hours in order for your provider to thoroughly review all the results before contacting the office for clarification of your results.   Thank you for choosing [facility] Noralyn Pick, CRNP

## 2020-03-19 ENCOUNTER — Other Ambulatory Visit: Payer: Self-pay | Admitting: Family Medicine

## 2020-03-22 ENCOUNTER — Other Ambulatory Visit: Payer: Self-pay | Admitting: Family Medicine

## 2020-03-22 ENCOUNTER — Other Ambulatory Visit: Payer: Self-pay | Admitting: Gastroenterology

## 2020-03-22 DIAGNOSIS — R112 Nausea with vomiting, unspecified: Secondary | ICD-10-CM

## 2020-03-22 DIAGNOSIS — R1115 Cyclical vomiting syndrome unrelated to migraine: Secondary | ICD-10-CM

## 2020-04-05 HISTORY — PX: COLONOSCOPY W/ POLYPECTOMY: SHX1380

## 2020-04-14 ENCOUNTER — Other Ambulatory Visit: Payer: Self-pay | Admitting: Gastroenterology

## 2020-04-14 ENCOUNTER — Ambulatory Visit (INDEPENDENT_AMBULATORY_CARE_PROVIDER_SITE_OTHER): Payer: Medicaid Other

## 2020-04-14 DIAGNOSIS — Z1159 Encounter for screening for other viral diseases: Secondary | ICD-10-CM | POA: Diagnosis not present

## 2020-04-14 LAB — SARS CORONAVIRUS 2 (TAT 6-24 HRS): SARS Coronavirus 2: NEGATIVE

## 2020-04-15 ENCOUNTER — Other Ambulatory Visit: Payer: Self-pay

## 2020-04-15 ENCOUNTER — Ambulatory Visit (AMBULATORY_SURGERY_CENTER): Payer: Medicaid Other | Admitting: Gastroenterology

## 2020-04-15 ENCOUNTER — Encounter: Payer: Self-pay | Admitting: Gastroenterology

## 2020-04-15 VITALS — BP 155/89 | HR 94 | Temp 97.1°F | Resp 23 | Ht 64.0 in | Wt 106.0 lb

## 2020-04-15 DIAGNOSIS — D124 Benign neoplasm of descending colon: Secondary | ICD-10-CM | POA: Diagnosis not present

## 2020-04-15 DIAGNOSIS — Z800 Family history of malignant neoplasm of digestive organs: Secondary | ICD-10-CM | POA: Diagnosis not present

## 2020-04-15 DIAGNOSIS — D120 Benign neoplasm of cecum: Secondary | ICD-10-CM

## 2020-04-15 DIAGNOSIS — K3189 Other diseases of stomach and duodenum: Secondary | ICD-10-CM

## 2020-04-15 DIAGNOSIS — K219 Gastro-esophageal reflux disease without esophagitis: Secondary | ICD-10-CM | POA: Diagnosis not present

## 2020-04-15 DIAGNOSIS — K2950 Unspecified chronic gastritis without bleeding: Secondary | ICD-10-CM | POA: Diagnosis not present

## 2020-04-15 DIAGNOSIS — R1115 Cyclical vomiting syndrome unrelated to migraine: Secondary | ICD-10-CM

## 2020-04-15 DIAGNOSIS — Z86010 Personal history of colonic polyps: Secondary | ICD-10-CM

## 2020-04-15 MED ORDER — SODIUM CHLORIDE 0.9 % IV SOLN: 500.0000 mL | Freq: Once | INTRAVENOUS | Status: DC

## 2020-04-15 NOTE — Op Note (Signed)
[place] Patient Name: Allison Leonard Procedure Date: 04/15/2020 2:15 PM MRN: [id number] Endoscopist: Thornton Park MD, MD Age: 44 Referring MD:  Date of Birth: Mar 02, 1976 Gender: Female Account #: [id number] Procedure:                Upper GI endoscopy Indications:              Nausea with vomiting, patient reported history of                            celiac Medicines:                Monitored Anesthesia Care Procedure:                Pre-Anesthesia Assessment:                           - Prior to the procedure, a History and Physical                            was performed, and patient medications and                            allergies were reviewed. The patient's tolerance of                            previous anesthesia was also reviewed. The risks                            and benefits of the procedure and the sedation                            options and risks were discussed with the patient.                            All questions were answered, and informed consent                            was obtained. Prior Anticoagulants: The patient has                            taken no previous anticoagulant or antiplatelet                            agents. ASA Grade Assessment: II - A patient with                            mild systemic disease. After reviewing the risks                            and benefits, the patient was deemed in                            satisfactory condition to undergo the procedure.  After obtaining informed consent, the endoscope was                            passed under direct vision. Throughout the                            procedure, the patient's blood pressure, pulse, and                            oxygen saturations were monitored continuously. The                            Endoscope was introduced through the mouth, and                            advanced to the third part of  duodenum. The upper                            GI endoscopy was accomplished without difficulty.                            The patient tolerated the procedure well. Scope In: Scope Out: Findings:                 The examined esophagus was normal. Biopsies were                            obtained from the proximal and distal esophagus                            with cold forceps for histology.                           Focal, serpiginous erythematous mucosa without                            bleeding was found in the gastric body. Biopsies                            were taken with a cold forceps for histology.                            Estimated blood loss was minimal.                           The examined duodenum was normal. Biopsies were                            taken throughout the examined small bowel with a                            cold forceps for histology. Estimated blood loss  was minimal.                           The cardia and gastric fundus were normal on                            retroflexion.                           The exam was otherwise without abnormality. Complications:            No immediate complications. Estimated blood loss:                            Minimal. Estimated Blood Loss:     Estimated blood loss was minimal. Impression:               - Normal esophagus. Biopsied.                           - Erythematous mucosa in the gastric body. Biopsied.                           - Normal examined duodenum. Biopsied.                           - The examination was otherwise normal. Recommendation:           - Patient has a contact number available for                            emergencies. The signs and symptoms of potential                            delayed complications were discussed with the                            patient. Return to normal activities tomorrow.                            Written discharge instructions  were provided to the                            patient.                           - Resume previous diet.                           - Continue present medications.                           - Await pathology results.                           - Proceed with colonoscopy today as previously  planned. Thornton Park MD, MD 04/15/2020 3:01:55 PM This report has been signed electronically.

## 2020-04-15 NOTE — Progress Notes (Signed)
Called to room to assist during endoscopic procedure.  Patient ID and intended procedure confirmed with present staff. Received instructions for my participation in the procedure from the performing physician.  

## 2020-04-15 NOTE — Op Note (Signed)
[place] Patient Name: Allison Leonard Procedure Date: 04/15/2020 2:14 PM MRN: [id number] Endoscopist: Thornton Park MD, MD Age: 44 Referring MD:  Date of Birth: [birth year] Gender: Female Account #: [id number] Procedure:                Colonoscopy Indications:              Screening in patient at increased risk: Family                            history of 1st-degree relative with colorectal                            cancer before age 63 years, High risk colon cancer                            surveillance: Personal history of colonic polyps                           History of colon polyps                           -5 tubular adenomatous on a colonoscopy in 2015                            with Dr. Redmond Pulling in [place]                           -Hyperplastic polyp on colonoscopy 2018                           -Surveillance colonoscopy recommended 2021                           Sister with colon cancer at age 20 Medicines:                Monitored Anesthesia Care Procedure:                Pre-Anesthesia Assessment:                           - Prior to the procedure, a History and Physical                            was performed, and patient medications and                            allergies were reviewed. The patient's tolerance of                            previous anesthesia was also reviewed. The risks                            and benefits of the procedure and the sedation                            options and  risks were discussed with the patient.                            All questions were answered, and informed consent                            was obtained. Prior Anticoagulants: The patient has                            taken no previous anticoagulant or antiplatelet                            agents. ASA Grade Assessment: II - A patient with                            mild systemic disease. After reviewing the risks                             and benefits, the patient was deemed in                            satisfactory condition to undergo the procedure.                           After obtaining informed consent, the colonoscope                            was passed under direct vision. Throughout the                            procedure, the patient's blood pressure, pulse, and                            oxygen saturations were monitored continuously. The                            Colonoscope was introduced through the anus and                            advanced to the 3 cm into the ileum. The                            colonoscopy was performed without difficulty. The                            patient tolerated the procedure well. The quality                            of the bowel preparation was good. The terminal                            ileum, ileocecal valve, appendiceal orifice, and  rectum were photographed. Scope In: 2:34:43 PM Scope Out: 2:52:52 PM Scope Withdrawal Time: 0 hours 16 minutes 10 seconds  Total Procedure Duration: 0 hours 18 minutes 9 seconds  Findings:                 The perianal and digital rectal examinations were                            normal.                           A 10-12 mm polyp was found in the cecum. The polyp                            was sessile. The polyp was removed with a piecemeal                            technique using a cold snare. Resection and                            retrieval were complete. Estimated blood loss was                            minimal.                           A 2 mm polyp was found in the descending colon. The                            polyp was sessile. The polyp was removed with a                            cold snare. Resection and retrieval were complete.                            Estimated blood loss was minimal.                           The exam was otherwise without abnormality on                             direct and retroflexion views. Complications:            No immediate complications. Estimated blood loss:                            Minimal. Estimated Blood Loss:     Estimated blood loss was minimal. Impression:               - One 10-12 mm polyp in the cecum, removed                            piecemeal using a cold snare. Resected and                            retrieved.                           -  One 2 mm polyp in the descending colon, removed                            with a cold snare. Resected and retrieved.                           - The examination was otherwise normal on direct                            and retroflexion views. Recommendation:           - Patient has a contact number available for                            emergencies. The signs and symptoms of potential                            delayed complications were discussed with the                            patient. Return to normal activities tomorrow.                            Written discharge instructions were provided to the                            patient.                           - Resume previous diet.                           - Continue present medications.                           - Await pathology results.                           - Repeat colonoscopy date to be determined after                            pending pathology results are reviewed for                            surveillance.                           - Emerging evidence supports eating a diet of                            fruits, vegetables, grains, calcium, and yogurt                            while reducing red meat and alcohol may reduce the  risk of colon cancer.                           - Thank you for allowing me to be involved in your                            colon cancer prevention. Thornton Park MD, MD 04/15/2020 3:08:07 PM This report has been signed electronically.

## 2020-04-15 NOTE — Progress Notes (Signed)
Patient states nausea.  dr Tarri Glenn noitifed.  Gave patient crackers due to lack of food. She ate 2 without incident.  Mother is with the patient.  She states that the patient is here due to nausea.  Patient states that she feels better since she passed gas.  Mother helped her get dressed. No longer has nausea.

## 2020-04-15 NOTE — Progress Notes (Signed)
To PACU, VSS. Report to [email or web address] 

## 2020-04-15 NOTE — Patient Instructions (Addendum)
Read all of the handouts given to you by your recovery room nurse.  YOU HAD AN ENDOSCOPIC PROCEDURE TODAY AT [facility]:   Refer to the procedure report that was given to you for any specific questions about what was found during the examination.  If the procedure report does not answer your questions, please call your gastroenterologist to clarify.  If you requested that your care partner not be given the details of your procedure findings, then the procedure report has been included in a sealed envelope for you to review at your convenience later.  YOU SHOULD EXPECT: Some feelings of bloating in the abdomen. Passage of more gas than usual.  Walking can help get rid of the air that was put into your GI tract during the procedure and reduce the bloating. If you had a lower endoscopy (such as a colonoscopy or flexible sigmoidoscopy) you may notice spotting of blood in your stool or on the toilet paper. If you underwent a bowel prep for your procedure, you may not have a normal bowel movement for a few days.  Please Note:  You might notice some irritation and congestion in your nose or some drainage.  This is from the oxygen used during your procedure.  There is no need for concern and it should clear up in a day or so.  SYMPTOMS TO REPORT IMMEDIATELY:   Following lower endoscopy (colonoscopy or flexible sigmoidoscopy):  Excessive amounts of blood in the stool  Significant tenderness or worsening of abdominal pains  Swelling of the abdomen that is new, acute  Fever of 100F or higher   Following upper endoscopy (EGD)  Vomiting of blood or coffee ground material  New chest pain or pain under the shoulder blades  Painful or persistently difficult swallowing  New shortness of breath  Fever of 100F or higher  Black, tarry-looking stools  For urgent or emergent issues, a gastroenterologist can be reached at any hour by calling [phone]. Do not use MyChart messaging for  urgent concerns.    DIET:  We do recommend a small meal at first, but then you may proceed to your regular diet.  Drink plenty of fluids but you should avoid alcoholic beverages for 24 hours.  Try to eat more fiber in your diet, and drink plenty of water.  Cut back on red meat and alcohol.  ACTIVITY:  You should plan to take it easy for the rest of today and you should NOT DRIVE or use heavy machinery until tomorrow (because of the sedation medicines used during the test).    FOLLOW UP: Our staff will call the number listed on your records 48-72 hours following your procedure to check on you and address any questions or concerns that you may have regarding the information given to you following your procedure. If we do not reach you, we will leave a message.  We will attempt to reach you two times.  During this call, we will ask if you have developed any symptoms of COVID 19. If you develop any symptoms (ie: fever, flu-like symptoms, shortness of breath, cough etc.) before then, please call [phone].  If you test positive for Covid 19 in the 2 weeks post procedure, please call and report this information to [place].    If any biopsies were taken you will be contacted by phone or by letter within the next 1-3 weeks.  Please call us at [phone] if you have not heard about the biopsies in  3 weeks.    SIGNATURES/CONFIDENTIALITY: You and/or your care partner have signed paperwork which will be entered into your electronic medical record.  These signatures attest to the fact that that the information above on your After Visit Summary has been reviewed and is understood.  Full responsibility of the confidentiality of this discharge information lies with you and/or your care-partner.

## 2020-04-16 ENCOUNTER — Other Ambulatory Visit: Payer: Self-pay

## 2020-04-16 ENCOUNTER — Emergency Department (HOSPITAL_COMMUNITY)
Admission: EM | Admit: 2020-04-16 | Discharge: 2020-04-17 | Disposition: A | Discharge status: Home / Self Care | Payer: Medicaid Other | Attending: Emergency Medicine | Admitting: Emergency Medicine

## 2020-04-16 ENCOUNTER — Encounter (HOSPITAL_COMMUNITY): Payer: Self-pay

## 2020-04-16 DIAGNOSIS — F17210 Nicotine dependence, cigarettes, uncomplicated: Secondary | ICD-10-CM | POA: Diagnosis not present

## 2020-04-16 DIAGNOSIS — R112 Nausea with vomiting, unspecified: Secondary | ICD-10-CM | POA: Diagnosis not present

## 2020-04-16 DIAGNOSIS — R1084 Generalized abdominal pain: Secondary | ICD-10-CM | POA: Diagnosis not present

## 2020-04-16 DIAGNOSIS — Z79899 Other long term (current) drug therapy: Secondary | ICD-10-CM | POA: Diagnosis not present

## 2020-04-16 DIAGNOSIS — R1115 Cyclical vomiting syndrome unrelated to migraine: Secondary | ICD-10-CM | POA: Insufficient documentation

## 2020-04-16 DIAGNOSIS — R1013 Epigastric pain: Secondary | ICD-10-CM | POA: Insufficient documentation

## 2020-04-16 DIAGNOSIS — R52 Pain, unspecified: Secondary | ICD-10-CM | POA: Diagnosis not present

## 2020-04-16 DIAGNOSIS — E162 Hypoglycemia, unspecified: Secondary | ICD-10-CM | POA: Diagnosis not present

## 2020-04-16 DIAGNOSIS — E161 Other hypoglycemia: Secondary | ICD-10-CM | POA: Diagnosis not present

## 2020-04-16 LAB — COMPREHENSIVE METABOLIC PANEL
ALT: 12 U/L (ref 0–44)
AST: 21 U/L (ref 15–41)
Albumin: 5 g/dL (ref 3.5–5.0)
Alkaline Phosphatase: 66 U/L (ref 38–126)
Anion gap: 18 — ABNORMAL HIGH (ref 5–15)
BUN: 6 mg/dL (ref 6–20)
CO2: 14 mmol/L — ABNORMAL LOW (ref 22–32)
Calcium: 9.2 mg/dL (ref 8.9–10.3)
Chloride: 105 mmol/L (ref 98–111)
Creatinine, Ser: 0.6 mg/dL (ref 0.44–1.00)
GFR calc Af Amer: >60 mL/min (ref >60)
GFR calc non Af Amer: >60 mL/min (ref >60)
Glucose, Bld: 92 mg/dL (ref 70–99)
Potassium: 4.5 mmol/L (ref 3.5–5.1)
Sodium: 137 mmol/L (ref 135–145)
Total Bilirubin: 1.1 mg/dL (ref 0.3–1.2)
Total Protein: 8.7 g/dL — ABNORMAL HIGH (ref 6.5–8.1)

## 2020-04-16 LAB — I-STAT BETA HCG BLOOD, ED (MC, WL, AP ONLY): I-stat hCG, quantitative: <5 m[IU]/mL (ref <5)

## 2020-04-16 LAB — CBC
HCT: 42.5 % (ref 36.0–46.0)
Hemoglobin: 13.3 g/dL (ref 12.0–15.0)
MCH: 27.1 pg (ref 26.0–34.0)
MCHC: 31.3 g/dL (ref 30.0–36.0)
MCV: 86.6 fL (ref 80.0–100.0)
Platelets: 221 10*3/uL (ref 150–400)
RBC: 4.91 MIL/uL (ref 3.87–5.11)
RDW: 14.7 % (ref 11.5–15.5)
WBC: 9.6 10*3/uL (ref 4.0–10.5)
nRBC: 0 % (ref 0.0–0.2)

## 2020-04-16 LAB — LIPASE, BLOOD: Lipase: 24 U/L (ref 11–51)

## 2020-04-16 MED ORDER — METOCLOPRAMIDE HCL 5 MG/ML IJ SOLN
10.0000 mg | Freq: Once | INTRAMUSCULAR | Status: AC
  Administered 2020-04-16: 10 mg via INTRAVENOUS
  Filled 2020-04-16: qty 2

## 2020-04-16 MED ORDER — DIPHENHYDRAMINE HCL 50 MG/ML IJ SOLN
12.5000 mg | Freq: Once | INTRAMUSCULAR | Status: AC
  Administered 2020-04-16: 12.5 mg via INTRAVENOUS
  Filled 2020-04-16: qty 1

## 2020-04-16 MED ORDER — SODIUM CHLORIDE 0.9 % IV BOLUS
2000.0000 mL | Freq: Once | INTRAVENOUS | Status: AC
  Administered 2020-04-16: 2000 mL via INTRAVENOUS

## 2020-04-16 NOTE — ED Triage Notes (Signed)
Pt reports N/V/D and abdominal pain after her colonoscopy yesterday. Hx of cyclic vomiting. VSS with EMS. 75 CBG.

## 2020-04-17 LAB — URINALYSIS, ROUTINE W REFLEX MICROSCOPIC
Bilirubin Urine: NEGATIVE
Glucose, UA: NEGATIVE mg/dL
Ketones, ur: 80 mg/dL — AB
Nitrite: NEGATIVE
Protein, ur: 30 mg/dL — AB
Specific Gravity, Urine: 1.015 (ref 1.005–1.030)
pH: 5 (ref 5.0–8.0)

## 2020-04-17 MED ORDER — PROMETHAZINE HCL 25 MG PO TABS
25.0000 mg | ORAL_TABLET | Freq: Four times a day (QID) | ORAL | 0 refills | Status: DC | PRN
Start: 2020-04-17 — End: 2020-07-07

## 2020-04-17 MED ORDER — PROMETHAZINE HCL 25 MG RE SUPP: 25.0000 mg | Freq: Four times a day (QID) | RECTAL | 0 refills | Status: DC | PRN

## 2020-04-17 NOTE — Discharge Instructions (Addendum)
Continue your regular medications and follow up with your doctor if symptoms are not controlled.   Return to the emergency department with any new or worsening symptoms.

## 2020-04-17 NOTE — ED Provider Notes (Signed)
[facility] DEPT Provider Note   CSN: [id number] Arrival date & time: 04/16/20  2004     History Chief Complaint  Patient presents with  . Nausea    post colonoscopy    Allison Leonard is a 44 y.o. female.  Patient to ED with nausea, vomiting that started yesterday. History of cyclic vomiting syndrome and reports current symptoms c/w this. No fever. No abdominal pain other than epigastric soreness with vomiting. No hematemesis or bloody stools. She underwent upper and lower endoscopies yesterday that she reports were uncomplicated procedures. Symptoms started after her scopes. She normally taken Phenergan at home but states she ran out.  The history is provided by the patient. No language interpreter was used.       Past Medical History:  Diagnosis Date  . Acid reflux 07/04/2018  . Anemia   . Celiac disease/sprue   . Cyclic vomiting syndrome   . Generalized anxiety disorder 07/04/2018  . History of colon polyps    last colonoscopy 2018 removed multiple benign polyps  . Migraines 07/04/2018    Patient Active Problem List   Diagnosis Date Noted  . History of colonic polyps 03/16/2020  . Cyclic vomiting syndrome 09/17/2019  . Patient underweight 05/14/2019  . Cannabis abuse 11/19/2018  . Cannabinoid hyperemesis syndrome 10/27/2018  . GERD (gastroesophageal reflux disease) 07/04/2018  . Migraines 07/04/2018    Past Surgical History:  Procedure Laterality Date  . ABDOMINAL HYSTERECTOMY  2006  . APPENDECTOMY  1999  . COLONOSCOPY  2018  . ESOPHAGOGASTRODUODENOSCOPY  2018     OB History   No obstetric history on file.     Family History  Problem Relation Age of Onset  . Diabetes Father   . Hypertension Father   . Hyperlipidemia Father   . Colon polyps Father   . Heart attack Maternal Grandmother   . Heart attack Paternal Grandmother   . Cervical cancer Maternal Aunt   . Stomach cancer Maternal Aunt   . Throat cancer  Maternal Uncle   . Stroke Maternal Uncle   . Down syndrome Son   . Colon cancer Sister   . Esophageal cancer Neg Hx   . Rectal cancer Neg Hx     Social History   Tobacco Use  . Smoking status: Current Some Day Smoker    Packs/day: 0.50    Years: 7.00    Pack years: 3.50    Types: Cigarettes  . Smokeless tobacco: Never Used  Vaping Use  . Vaping Use: Never used  Substance Use Topics  . Alcohol use: Not Currently  . Drug use: Not Currently    Types: Marijuana    Comment: 04/11/20,last  had    Home Medications Prior to Admission medications   Medication Sig Start Date End Date Taking? Authorizing Provider  dicyclomine (BENTYL) 20 MG tablet Take 1 tablet (20 mg total) by mouth 3 (three) times daily as needed for spasms. 03/22/20   Thornton Park, MD  famotidine (PEPCID) 20 MG tablet Take 1 tablet (20 mg total) by mouth 2 (two) times daily. 03/16/20   Noralyn Pick, NP  Hyoscyamine Sulfate SL (LEVSIN/SL) 0.125 MG SUBL Place 1 each under the tongue 4 (four) times daily as needed for up to 5 days. 02/12/20 02/17/20  Fatima Blank, MD  lidocaine (XYLOCAINE) 2 % solution Use as directed 15 mLs in the mouth or throat every 6 (six) hours as needed for mouth pain. Patient not taking: Reported on 04/15/2020 02/12/20  Fatima Blank, MD  LORazepam (ATIVAN) 0.5 MG tablet Take 2 tablets (1 mg total) by mouth 3 (three) times daily as needed (nausea and vomiting). Patient not taking: Reported on 04/15/2020 02/12/20   Lyndee Hensen, DO  metoCLOPramide (REGLAN) 10 MG tablet Take 1 tablet (10 mg total) by mouth every 6 (six) hours. 02/09/20   Recardo Evangelist, PA-C  pantoprazole (PROTONIX) 20 MG tablet TAKE 1 TABLET BY MOUTH EVERY DAY 03/23/20   Brimage, Ronnette Juniper, DO  prochlorperazine (COMPAZINE) 10 MG tablet TAKE 1 TABLET (10 MG TOTAL) BY MOUTH EVERY 8 (EIGHT) HOURS AS NEEDED FOR NAUSEA OR VOMITING. 03/23/20   Brimage, Ronnette Juniper, DO  promethazine (PHENERGAN) 12.5 MG tablet TAKE 1  TABLET (12.5 MG TOTAL) BY MOUTH EVERY 12 (TWELVE) HOURS AS NEEDED FOR NAUSEA OR VOMITING. Patient not taking: Reported on 04/15/2020 03/23/20   Lyndee Hensen, DO  SUMAtriptan (IMITREX) 100 MG tablet Take 1 tablet earliest onset of migraine.  May repeat in 2 hours if headache persists or recurs.  Maximum 2 tablets in 24 hours 12/18/19   Pieter Partridge, DO  topiramate (TOPAMAX) 50 MG tablet Take 1 tablet (50 mg total) by mouth at bedtime as needed (migraine). 02/19/20   Tomi Likens, Adam R, DO  pantoprazole (PROTONIX) 20 MG tablet Take 1 tablet (20 mg total) by mouth daily. 09/16/19   Caroline More, DO    Allergies    Zofran Alvis Lemmings hcl] and Morphine and related  Review of Systems   Review of Systems  Constitutional: Negative for chills and fever.  HENT: Negative.   Respiratory: Negative.   Cardiovascular: Negative.   Gastrointestinal: Positive for nausea and vomiting. Negative for blood in stool.  Genitourinary: Negative for decreased urine volume.  Musculoskeletal: Negative.   Skin: Negative.   Neurological: Negative.     Physical Exam Updated Vital Signs BP (!) 141/86   Pulse (!) 101   Temp 98 F (36.7 C) (Oral)   Resp 18   Ht 5' 4"  (1.626 m)   Wt 48.1 kg   SpO2 99%   BMI 18.19 kg/m   Physical Exam Vitals and nursing note reviewed.  Constitutional:      Appearance: She is well-developed.  HENT:     Head: Normocephalic.     Mouth/Throat:     Mouth: Mucous membranes are dry.  Cardiovascular:     Rate and Rhythm: Normal rate and regular rhythm.     Heart sounds: No murmur heard.   Pulmonary:     Effort: Pulmonary effort is normal.     Breath sounds: Normal breath sounds. No wheezing, rhonchi or rales.  Abdominal:     General: Bowel sounds are normal. There is no distension.     Palpations: Abdomen is soft.     Tenderness: There is no abdominal tenderness. There is no guarding or rebound.  Musculoskeletal:        General: Normal range of motion.     Cervical back:  Normal range of motion and neck supple.  Skin:    General: Skin is warm and dry.     Findings: No rash.  Neurological:     Mental Status: She is alert and oriented to person, place, and time.     ED Results / Procedures / Treatments   Labs (all labs ordered are listed, but only abnormal results are displayed) Labs Reviewed  COMPREHENSIVE METABOLIC PANEL - Abnormal; Notable for the following components:      Result Value   CO2 14 (*)  Total Protein 8.7 (*)    Anion gap 18 (*)    All other components within normal limits  URINALYSIS, ROUTINE W REFLEX MICROSCOPIC - Abnormal; Notable for the following components:   Hgb urine dipstick SMALL (*)    Ketones, ur 80 (*)    Protein, ur 30 (*)    Leukocytes,Ua SMALL (*)    Bacteria, UA RARE (*)    All other components within normal limits  LIPASE, BLOOD  CBC  I-STAT BETA HCG BLOOD, ED ([facility], [facility], [facility] ONLY)    EKG None  Radiology No results found.  Procedures Procedures (including critical care time)  Medications Ordered in ED Medications  sodium chloride 0.9 % bolus 2,000 mL (2,000 mLs Intravenous New Bag/Given 04/16/20 2345)  metoCLOPramide (REGLAN) injection 10 mg (10 mg Intravenous Given 04/16/20 2345)  diphenhydrAMINE (BENADRYL) injection 12.5 mg (12.5 mg Intravenous Given 04/16/20 2345)    ED Course  I have reviewed the triage vital signs and the nursing notes.  Pertinent labs & imaging results that were available during my care of the patient were reviewed by me and considered in my medical decision making (see chart for details).    MDM Rules/Calculators/A&P                          Patient to ED with N/V that started yesterday, c/w h/o cyclic vomiting. No fever or significant abdominal pain.   IV started with 2 liters NS. Reglan/Benadryl provided. On recheck, she reports her nausea is controlled. She feels better after fluids and is tolerating PO fluids. She reports she feels comfortable with discharge home.   No  abdominal tenderness or bloody emesis/stools. Doubt symptoms are associated with colonoscopy/EGD performed yesterday.   Feel she is appropriate for discharge home.   Final Clinical Impression(s) / ED Diagnoses Final diagnoses:  None   1. Nausea and vomiting 2. History of cyclic vomiting  Rx / DC Orders ED Discharge Orders    None       Dennie Bible 04/17/20 0215    Molpus, Jenny Reichmann, MD 04/17/20 [id number]

## 2020-04-19 ENCOUNTER — Telehealth: Payer: Self-pay | Admitting: *Deleted

## 2020-04-19 NOTE — Telephone Encounter (Signed)
Thank you for the update!

## 2020-04-19 NOTE — Telephone Encounter (Signed)
  Follow up Call-  Call back number 04/15/2020  Post procedure Call Back phone  # [phone]  Permission to leave phone message Yes     Patient questions:  Do you have a fever, pain , or abdominal swelling? No. Pain Score  0 *  Have you tolerated food without any problems? Yes.    Have you been able to return to your normal activities? Yes.    Do you have any questions about your discharge instructions: Diet   No. Medications  No. Follow up visit  No.  Do you have questions or concerns about your Care? No.- patient did report that she had to go the ED the same day of her procedure d/t intractable vomiting after the procedure. Reports she is feeling better now. Will route to Dr. Tarri Glenn just as an Allison Leonard, patient does not report any need to speak with MD at this time.   Actions: * If pain score is 4 or above: No action needed, pain <4.  1. Have you developed a fever since your procedure? no  2.   Have you had an respiratory symptoms (SOB or cough) since your procedure? no  3.   Have you tested positive for COVID 19 since your procedure no  4.   Have you had any family members/close contacts diagnosed with the COVID 19 since your procedure?  no   If yes to any of these questions please route to Joylene John, RN and Erenest Rasher, RN

## 2020-04-20 ENCOUNTER — Telehealth: Payer: Self-pay | Admitting: Gastroenterology

## 2020-04-20 NOTE — Telephone Encounter (Signed)
Lm on vm for patient to return call.   Recommending Miralax, taking 1-3 doses per day until she has a bowel movement, once she has a bowel movement she can decrease to 1 dose daily as needed.

## 2020-04-20 NOTE — Telephone Encounter (Signed)
Pt states that she has been constipated since she had her procedure on 6/11. She would like something prescribed.

## 2020-04-20 NOTE — Telephone Encounter (Signed)
Spoke with patient regarding recommendations, pt advised to call if there is no improvement within a few days. Pt states that she has been doing 1-2 doses of Miralax a day, but has not taken any today. Pt advised to increase to 3 times a day if able to tolerate, pt advised that once she has a BM she can decrease to 1 dose as needed.

## 2020-04-22 ENCOUNTER — Encounter: Payer: Self-pay | Admitting: Gastroenterology

## 2020-04-22 ENCOUNTER — Ambulatory Visit: Payer: Medicaid Other | Admitting: Neurology

## 2020-04-25 ENCOUNTER — Other Ambulatory Visit: Payer: Self-pay | Admitting: Family Medicine

## 2020-04-25 DIAGNOSIS — R1115 Cyclical vomiting syndrome unrelated to migraine: Secondary | ICD-10-CM

## 2020-04-27 NOTE — Progress Notes (Signed)
NEUROLOGY FOLLOW UP OFFICE NOTE  Allison Leonard [id number]  HISTORY OF PRESENT ILLNESS: Allison Leonard is a 44 year old female with Celiac disease and Cyclic Vomiting Syndrome who follows up for migraines.  UPDATE: MRI and MRA of head was ordered but she got sick and was unable to have it done.   She notes no improvement in headaches.  Intensity:  severe Duration:  Sumatriptan eases headache after an hour but not aborts it.  Ineffective if she wakes up with headache. Frequency:  13 headaches in last 30 days Current NSAIDS:  none Current analgesics:  none Current triptans:  sumatriptan 147m Current ergotamine:  none Current anti-emetic:  Reglan 163m promethazine 12.5m38murrent muscle relaxants:  none Current anti-anxiolytic:  Lorazepam 1mg74mN Current sleep aide:  none Current Antihypertensive medications:  none Current Antidepressant medications:  none Current Anticonvulsant medications: topiramate 50mg45mbedtime Current anti-CGRP:  none Current Vitamins/Herbal/Supplements:  none Current Antihistamines/Decongestants:  none Other therapy:  none Hormone/birth control:  none  Caffeine:  Coffee 1 to 2 times a month.  No soda Exercise:  no Depression:  no; Anxiety:  no Other pain:  Sometimes abdominal discomfort Sleep hygiene:  Restless  HISTORY: Started having headaches at age 65.  2rse over the past 3 to 4 years.  They are mostly severe sharp or pounding headache in her right temple.  Usually lasts a day.  Sometimes they wake her up at night.  17 days over last 30 days.  Nausea, vomiting, photophobia, phonophobia, blurred vision.  No numbness or weakness.  No specific triggers.  Vomiting may help make it better.   Past NSAIDS:  ibuprofen Past analgesics:  Fioricet, Excedrin Past abortive triptans:  none Past abortive ergotamine:  none Past muscle relaxants:  none Past anti-emetic:  Compazine Past antihypertensive medications:  none Past  antidepressant medications:  none Past anticonvulsant medications:  none Past anti-CGRP:  none Past vitamins/Herbal/Supplements:  none Past antihistamines/decongestants:  Benadryl Other past therapies:  none  Family history of headache:  Brother, sister, mother, daughter  PAST MEDICAL HISTORY: Past Medical History:  Diagnosis Date  . Acid reflux 07/04/2018  . Anemia   . Celiac disease/sprue   . Cyclic vomiting syndrome   . Generalized anxiety disorder 07/04/2018  . History of colon polyps    last colonoscopy 2018 removed multiple benign polyps  . Migraines 07/04/2018    MEDICATIONS: Current Outpatient Medications on File Prior to Visit  Medication Sig Dispense Refill  . dicyclomine (BENTYL) 20 MG tablet Take 1 tablet (20 mg total) by mouth 3 (three) times daily as needed for spasms. 90 tablet 5  . famotidine (PEPCID) 20 MG tablet Take 1 tablet (20 mg total) by mouth 2 (two) times daily. 60 tablet 0  . Hyoscyamine Sulfate SL (LEVSIN/SL) 0.125 MG SUBL Place 1 each under the tongue 4 (four) times daily as needed for up to 5 days. 30 tablet 0  . lidocaine (XYLOCAINE) 2 % solution Use as directed 15 mLs in the mouth or throat every 6 (six) hours as needed for mouth pain. (Patient not taking: Reported on 04/15/2020) 100 mL 0  . LORazepam (ATIVAN) 0.5 MG tablet Take 2 tablets (1 mg total) by mouth 3 (three) times daily as needed (nausea and vomiting). (Patient not taking: Reported on 04/15/2020) 30 tablet 0  . metoCLOPramide (REGLAN) 10 MG tablet Take 1 tablet (10 mg total) by mouth every 6 (six) hours. 15 tablet 0  . pantoprazole (PROTONIX) 20 MG tablet TAKE  1 TABLET BY MOUTH EVERY DAY 30 tablet 1  . prochlorperazine (COMPAZINE) 10 MG tablet TAKE 1 TABLET (10 MG TOTAL) BY MOUTH EVERY 8 (EIGHT) HOURS AS NEEDED FOR NAUSEA OR VOMITING. 30 tablet 0  . promethazine (PHENERGAN) 25 MG suppository Place 1 suppository (25 mg total) rectally every 6 (six) hours as needed for nausea or vomiting. 12 each 0   . promethazine (PHENERGAN) 25 MG tablet Take 1 tablet (25 mg total) by mouth every 6 (six) hours as needed for nausea or vomiting. 12 tablet 0  . SUMAtriptan (IMITREX) 100 MG tablet Take 1 tablet earliest onset of migraine.  May repeat in 2 hours if headache persists or recurs.  Maximum 2 tablets in 24 hours 10 tablet 2  . topiramate (TOPAMAX) 50 MG tablet Take 1 tablet (50 mg total) by mouth at bedtime as needed (migraine). 30 tablet 1  . [DISCONTINUED] pantoprazole (PROTONIX) 20 MG tablet Take 1 tablet (20 mg total) by mouth daily. 30 tablet 1   Current Facility-Administered Medications on File Prior to Visit  Medication Dose Route Frequency Provider Last Rate Last Admin  . 0.9 %  sodium chloride infusion  500 mL Intravenous Once [place], MD        ALLERGIES: Allergies  Allergen Reactions  . Zofran [Ondansetron Hcl] Anaphylaxis  . Morphine And Related Itching    FAMILY HISTORY: Family History  Problem Relation Age of Onset  . Diabetes Father   . Hypertension Father   . Hyperlipidemia Father   . Colon polyps Father   . Heart attack Maternal Grandmother   . Heart attack Paternal Grandmother   . Cervical cancer Maternal Aunt   . Stomach cancer Maternal Aunt   . Throat cancer Maternal Uncle   . Stroke Maternal Uncle   . Down syndrome Son   . Colon cancer Sister   . Esophageal cancer Neg Hx   . Rectal cancer Neg Hx     SOCIAL HISTORY: Social History   Socioeconomic History  . Marital status: Single    Spouse name: Not on file  . Number of children: 3  . Years of education: Not on file  . Highest education level: Not on file  Occupational History  . Not on file  Tobacco Use  . Smoking status: Current Some Day Smoker    Packs/day: 0.50    Years: 7.00    Pack years: 3.50    Types: Cigarettes  . Smokeless tobacco: Never Used  Vaping Use  . Vaping Use: Never used  Substance and Sexual Activity  . Alcohol use: Not Currently  . Drug use: Not Currently     Types: Marijuana    Comment: 04/11/20,last  had  . Sexual activity: Not on file  Other Topics Concern  . Not on file  Social History Narrative   Right handed   One  Story   Drinks coffee occasionally   Social Determinants of Health   Financial Resource Strain:   . Difficulty of Paying Living Expenses:   Food Insecurity:   . Worried About Charity fundraiser in the Last Year:   . Arboriculturist in the Last Year:   Transportation Needs:   . Film/video editor (Medical):   Marland Kitchen Lack of Transportation (Non-Medical):   Physical Activity:   . Days of Exercise per Week:   . Minutes of Exercise per Session:   Stress:   . Feeling of Stress :   Social Connections:   . Frequency  of Communication with Friends and Family:   . Frequency of Social Gatherings with Friends and Family:   . Attends Religious Services:   . Active Member of Clubs or Organizations:   . Attends Archivist Meetings:   Marland Kitchen Marital Status:   Intimate Partner Violence:   . Fear of Current or Ex-Partner:   . Emotionally Abused:   Marland Kitchen Physically Abused:   . Sexually Abused:     PHYSICAL EXAM: Blood pressure 132/82, pulse (!) 104, height 5' 4"  (1.626 m), weight 105 lb 12.8 oz (48 kg), SpO2 100 %. General: No acute distress.  Patient appears well-groomed.   Head:  Normocephalic/atraumatic Eyes:  Fundi examined but not visualized Neck: supple, no paraspinal tenderness, full range of motion Heart:  Regular rate and rhythm Lungs:  Clear to auscultation bilaterally Back: No paraspinal tenderness Neurological Exam: alert and oriented to person, place, and time. Attention span and concentration intact, recent and remote memory intact, fund of knowledge intact.  Speech fluent and not dysarthric, language intact.  CN II-XII intact. Bulk and tone normal, muscle strength 5/5 throughout.  Sensation to light touch, temperature and vibration intact.  Deep tendon reflexes 2+ throughout, toes downgoing.  Finger to nose and  heel to shin testing intact.  Gait normal, Romberg negative.  IMPRESSION: Chronic migraine without aura, without status migrainosus, not intractable  PLAN: 1.  For preventative management, increase topiramate to 111m at bedtime.  If headaches not improved in 6 weeks, she will contact me. 2.  For abortive therapy, stop sumatriptan tablet and start sumatriptan 240mNS for faster absorption of medication by bypassing her GI system (nausea and vomiting) and since she often wakes up with headache. 3.  Limit use of pain relievers to no more than 2 days out of week to prevent risk of rebound or medication-overuse headache. 4.  Keep headache diary 5.  She will reschedule MRI/MRA of head 6.  Follow up in 4 months.   AdMetta ClinesDO  CC:  VoLyndee HensenDO

## 2020-04-29 ENCOUNTER — Encounter: Payer: Self-pay | Admitting: Neurology

## 2020-04-29 ENCOUNTER — Ambulatory Visit (INDEPENDENT_AMBULATORY_CARE_PROVIDER_SITE_OTHER): Payer: Medicaid Other | Admitting: Neurology

## 2020-04-29 ENCOUNTER — Other Ambulatory Visit: Payer: Self-pay

## 2020-04-29 VITALS — BP 132/82 | HR 104 | Ht 64.0 in | Wt 105.8 lb

## 2020-04-29 DIAGNOSIS — G43019 Migraine without aura, intractable, without status migrainosus: Secondary | ICD-10-CM | POA: Diagnosis not present

## 2020-04-29 DIAGNOSIS — R1115 Cyclical vomiting syndrome unrelated to migraine: Secondary | ICD-10-CM | POA: Diagnosis not present

## 2020-04-29 DIAGNOSIS — R519 Headache, unspecified: Secondary | ICD-10-CM | POA: Diagnosis not present

## 2020-04-29 MED ORDER — TOPIRAMATE 100 MG PO TABS: 100.0000 mg | ORAL_TABLET | Freq: Every day | ORAL | 5 refills | Status: DC

## 2020-04-29 MED ORDER — SUMATRIPTAN 20 MG/ACT NA SOLN: NASAL | 5 refills | Status: DC

## 2020-04-29 NOTE — Patient Instructions (Addendum)
1.  Increase topiramate to 115m at bedtime every night.  If headaches not improved in 6 weeks, contact me. 2.  Stop sumatriptan pill.  Instead, take sumatriptan nasal spray for headache attacks.  Take as directed. 3.  Limit use of pain relievers to no more than 2 days out of week to prevent risk of rebound or medication-overuse headache. 4.  MRI and MRA of head. [facility] will [facility] you directly to schedule your appointment. They are located at [address] If you need to contact them directly please call [phone]  5.  Follow up in 4 months.

## 2020-05-10 ENCOUNTER — Other Ambulatory Visit: Payer: Self-pay | Admitting: Family Medicine

## 2020-05-10 DIAGNOSIS — R1115 Cyclical vomiting syndrome unrelated to migraine: Secondary | ICD-10-CM

## 2020-05-18 ENCOUNTER — Other Ambulatory Visit: Payer: Self-pay | Admitting: *Deleted

## 2020-05-19 MED ORDER — PROMETHAZINE HCL 25 MG RE SUPP: 25.0000 mg | Freq: Four times a day (QID) | RECTAL | 0 refills | Status: DC | PRN

## 2020-05-24 ENCOUNTER — Other Ambulatory Visit: Payer: Self-pay | Admitting: Nurse Practitioner

## 2020-06-02 ENCOUNTER — Other Ambulatory Visit: Payer: Medicaid Other

## 2020-06-02 ENCOUNTER — Other Ambulatory Visit: Payer: Self-pay | Admitting: Family Medicine

## 2020-06-03 ENCOUNTER — Other Ambulatory Visit: Payer: Self-pay

## 2020-06-03 ENCOUNTER — Encounter: Payer: Self-pay | Admitting: Neurology

## 2020-06-03 ENCOUNTER — Ambulatory Visit
Admission: RE | Admit: 2020-06-03 | Discharge: 2020-06-03 | Disposition: A | Discharge status: Home / Self Care | Payer: Medicaid Other | Source: Ambulatory Visit | Attending: Neurology | Admitting: Neurology

## 2020-06-03 DIAGNOSIS — R519 Headache, unspecified: Secondary | ICD-10-CM

## 2020-06-03 IMAGING — MR MR HEAD WO/W CM
12 series · 48 of 48 positions shown · IV contrast (10ml multihance)
Comparison: None.

CLINICAL DATA: Headache

EXAM:
MRI HEAD WITHOUT AND WITH CONTRAST
MRA HEAD WITHOUT CONTRAST
TECHNIQUE: Multiplanar, multiecho pulse sequences of the brain and surrounding
structures were obtained without and with intravenous contrast.
Angiographic images of the head were obtained using MRA technique
without contrast.
CONTRAST:  10mL MULTIHANCE GADOBENATE DIMEGLUMINE 529 MG/ML IV SOLN

[Series 2: T1 · sagittal · 5.0mm · 0.45mm/px · 1 of 21 slices shown]
[im 1/21]
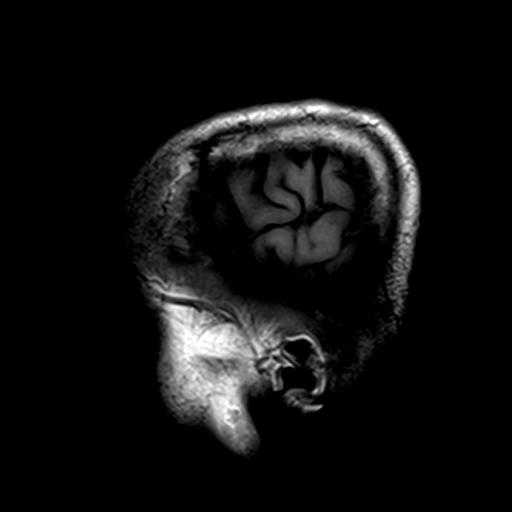

[Series 3: DWI · axial · 3.0mm · 1.80mm/px · z∈[-52,+95]mm · 7 of 99 slices shown (1 of 4)]
[im 1/99]
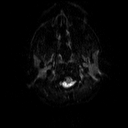
[im 17/99]
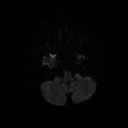
[im 33/99]
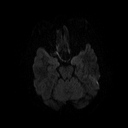
[im 50/99]
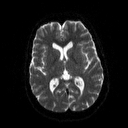
[im 66/99]
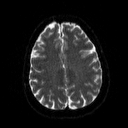
[im 82/99]
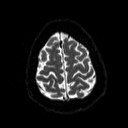
[im 99/99]
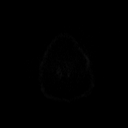

[Series 4: DWI · axial · 3.0mm · 1.80mm/px · z∈[-52,+95]mm · 3 of 49 slices shown (2 of 4)]
[im 1/49]
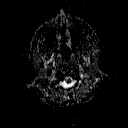
[im 25/49]
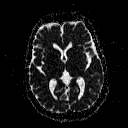
[im 49/49]
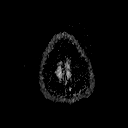

[Series 5: DWI · coronal · 5.0mm · 1.80mm/px · 5 of 67 slices shown (3 of 4)]
[im 1/67]
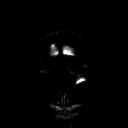
[im 17/67]
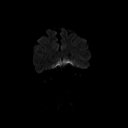
[im 34/67]
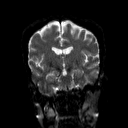
[im 50/67]
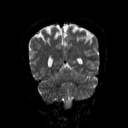
[im 67/67]
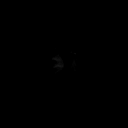

[Series 6: DWI · coronal · 5.0mm · 1.80mm/px · 2 of 34 slices shown (4 of 4)]
[im 1/34]
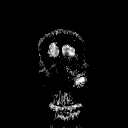
[im 34/34]
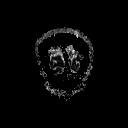

[Series 7: T2 · axial · 5.0mm · 0.60mm/px · z∈[-54,+101]mm · 2 of 24 slices shown (1 of 2)]
[im 1/24]
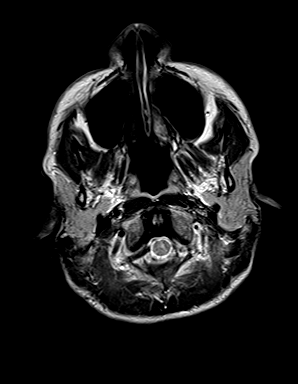
[im 24/24]
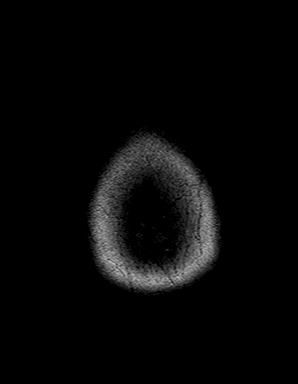

[Series 8: FLAIR · axial · 3.0mm · 0.45mm/px · z∈[-52,+97]mm · 2 of 33 slices shown]
[im 1/33]
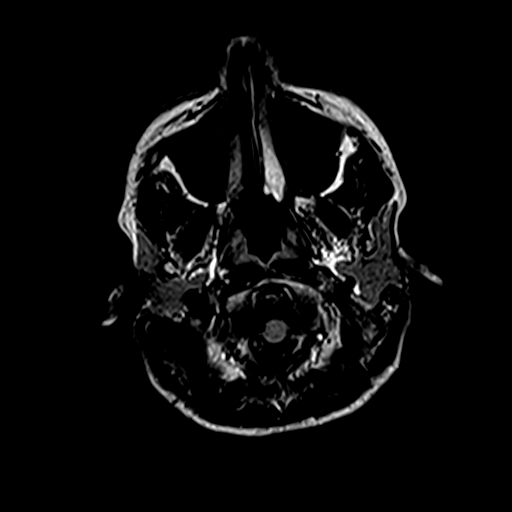
[im 33/33]
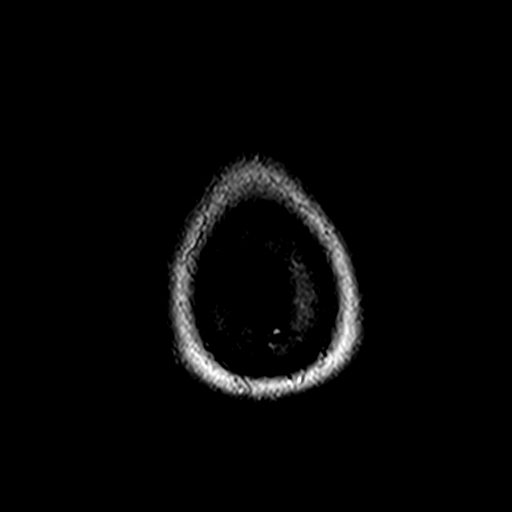

[Series 14: swi_images · axial · 4.0mm · 0.90mm/px · z∈[-47,+93]mm · 2 of 36 slices shown]
[im 1/36]
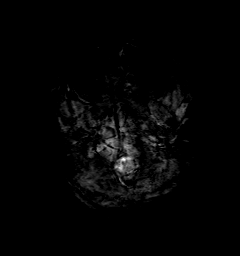
[im 36/36]
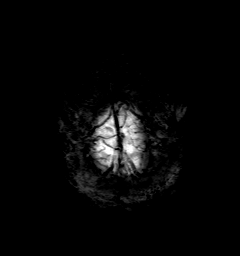

[Series 15: t1_mpr_tra · axial · 1.0mm · 0.75mm/px · z∈[-48,+94]mm · 10 of 144 slices shown (1 of 2)]
[im 1/144]
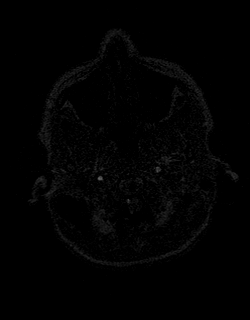
[im 16/144]
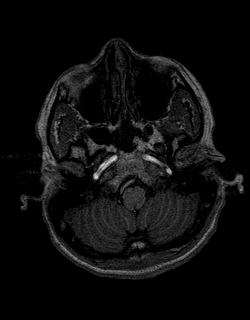
[im 32/144]
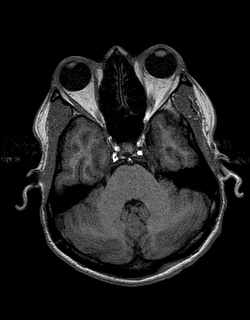
[im 48/144]
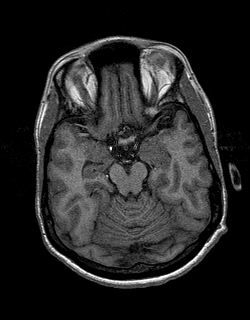
[im 64/144]
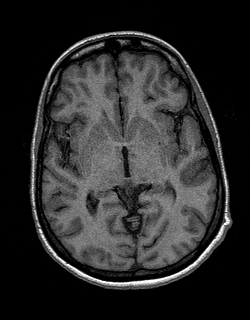
[im 80/144]
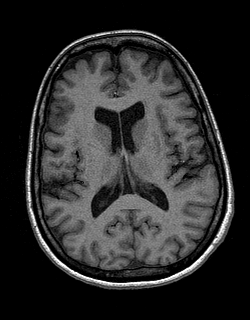
[im 96/144]
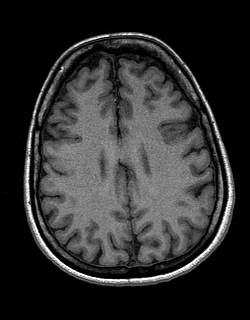
[im 112/144]
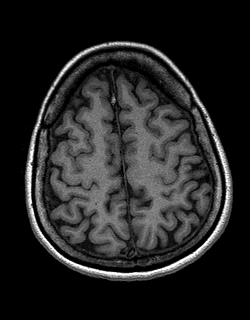
[im 128/144]
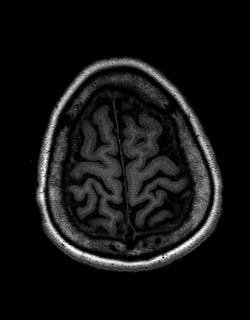
[im 144/144]
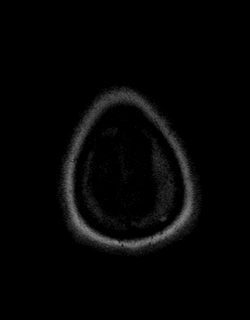

[Series 16: T2 · coronal · 5.0mm · 0.45mm/px · 2 of 25 slices shown (2 of 2)]
[im 1/25]
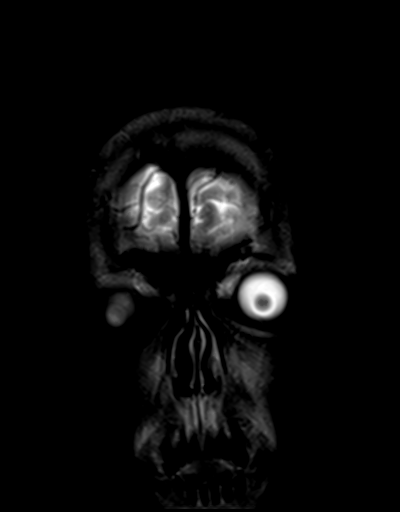
[im 25/25]
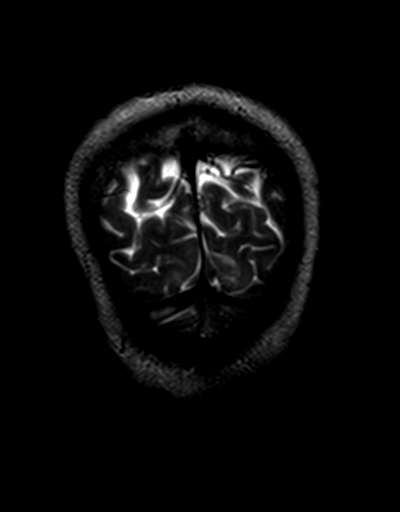

[Series 24: t1_mpr_tra · axial · 1.0mm · 0.75mm/px · z∈[-48,+94]mm · 10 of 144 slices shown (2 of 2)]
[im 1/144]
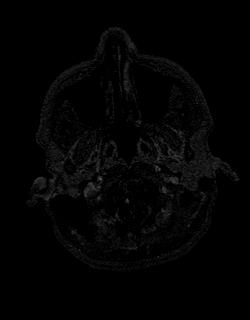
[im 16/144]
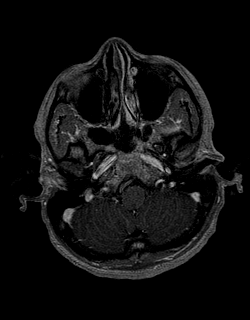
[im 32/144]
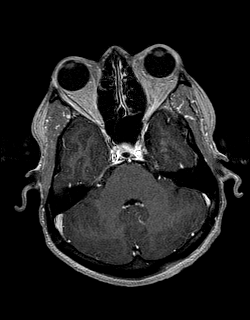
[im 48/144]
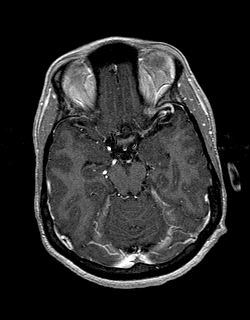
[im 64/144]
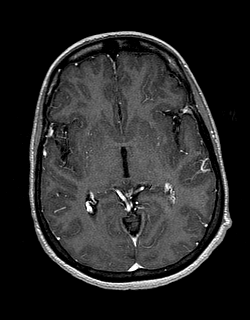
[im 80/144]
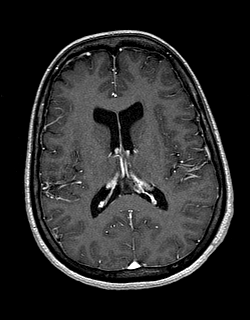
[im 96/144]
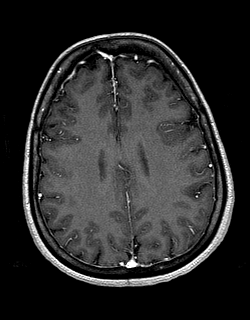
[im 112/144]
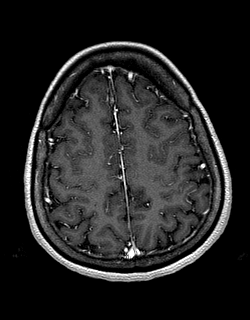
[im 128/144]
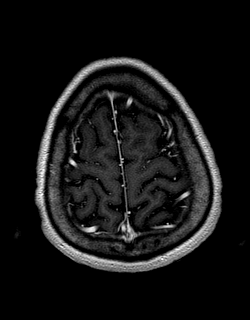
[im 144/144]
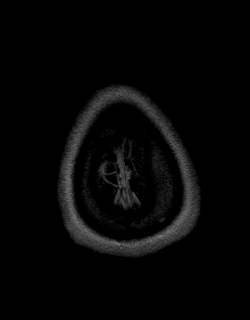

[Series 25: post cor · coronal · 5.0mm · 0.45mm/px · 2 of 25 slices shown]
[im 1/25]
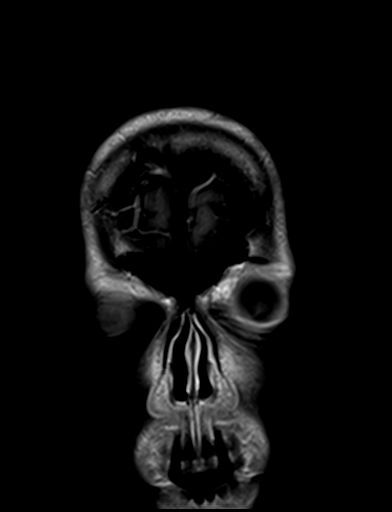
[im 25/25]
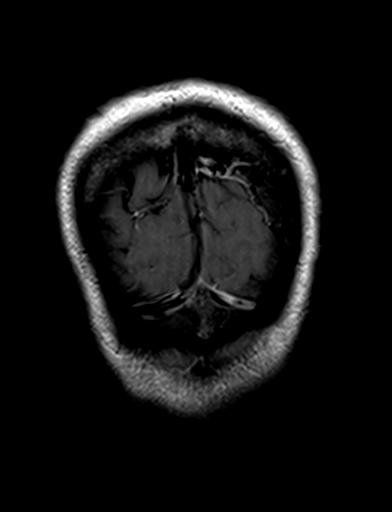

[48 of 48 positions shown; findings below may reference images not displayed]

FINDINGS: MRI HEAD FINDINGS

Brain: Minimal periventricular white matter T2/FLAIR hyperintense
foci. No acute infarct or intracranial hemorrhage. No midline shift,
ventriculomegaly or extra-axial fluid collection. No mass lesion. No
abnormal enhancement.

Skull and upper cervical spine: Normal marrow signal.

Sinuses/Orbits: Normal orbits. Clear paranasal sinuses. No mastoid
effusion.

Other: None.

MRA HEAD FINDINGS

Anterior circulation: Mild-to-moderate narrowing of the bilateral
distal A1 segments ([DATE]). 2-3 mm focal outpouching involving the
left ICA terminus ([DATE]). Query 1-2 mm inferiorly oriented focal
outpouching involving the right intracavernous ICA ([DATE]). No
proximal occlusion or vascular malformation.

Posterior circulation: Dominant right vertebral artery. 2-3 mm
saccular outpouching involving the left lateral aspect of the
proximal basilar artery ([DATE], [DATE]). Dorsally oriented 1-2 mm
focal outpouching involving the right P1 segment ([DATE], [DATE]). No
significant stenosis, proximal occlusion, or vascular malformation.

Venous sinuses: Focal filling defect within the dorsal superior
sagittal sinus with correlative T2 hyperintense signal ([DATE]) is
concerning for chronic nonocclusive thrombus. Remaining dural venous
sinuses appear patent.

Anatomic variants: None of significance.
IMPRESSION: No acute infarct or intracranial hemorrhage.

Nonocclusive thrombus involving the dorsal superior sagittal sinus,
likely chronic. Consider dedicated MRV for further evaluation.

1-3 mm focal outpouchings involving the right intracavernous ICA,
left ICA terminus, proximal basilar artery and right P1 segment.
These may reflect small aneurysms versus infundibula.

Mild-to-moderate bilateral A1 segment narrowing.

Minimal periventricular white matter T2 hyperintensities.
Differential includes chronic microvascular ischemic changes, post
infectious/inflammatory sequela and demyelination.

These results were called by telephone at the time of interpretation
on [DATE] at [DATE] to provider AUNG , who verbally
acknowledged these results.

## 2020-06-03 IMAGING — MR MR MRA HEAD W/O CM
1 series · 22 of 48 positions shown · IV contrast (multihance)
Comparison: None.

CLINICAL DATA: Headache

EXAM:
MRI HEAD WITHOUT AND WITH CONTRAST
MRA HEAD WITHOUT CONTRAST
TECHNIQUE: Multiplanar, multiecho pulse sequences of the brain and surrounding
structures were obtained without and with intravenous contrast.
Angiographic images of the head were obtained using MRA technique
without contrast.
CONTRAST:  10mL MULTIHANCE GADOBENATE DIMEGLUMINE 529 MG/ML IV SOLN

[Series 2: tof_3d_multi-slab · axial · 0.7mm · 0.35mm/px · z∈[-50,+49]mm · 22 of 149 slices shown]
[im 1/149]
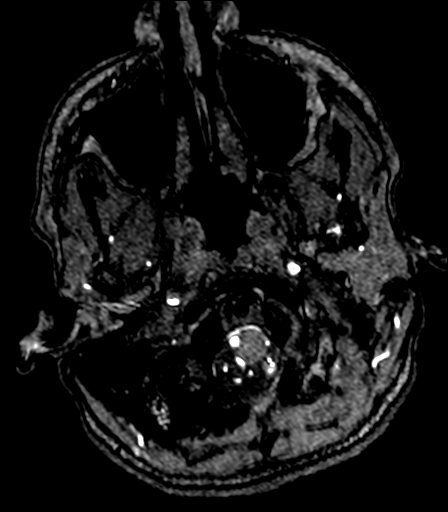
[im 4/149]
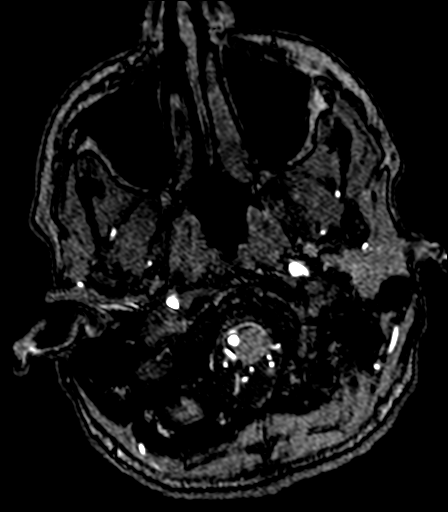
[im 7/149]
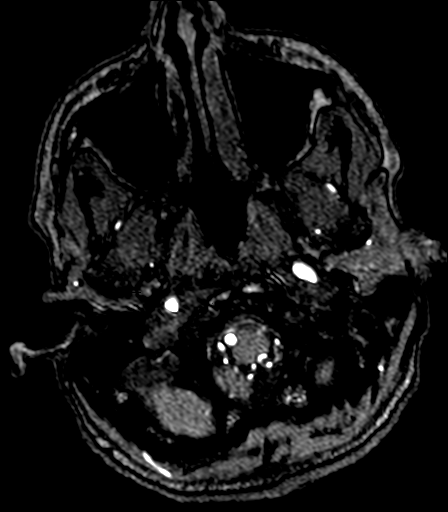
[im 10/149]
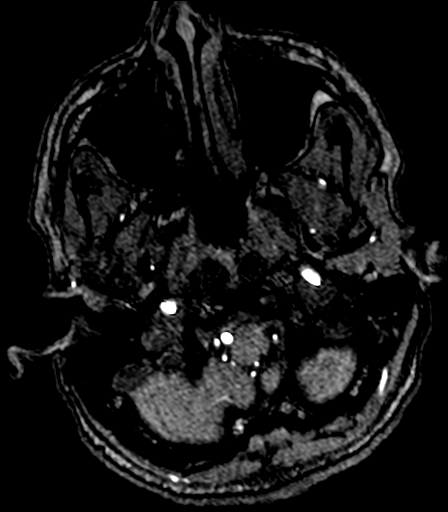
[im 13/149]
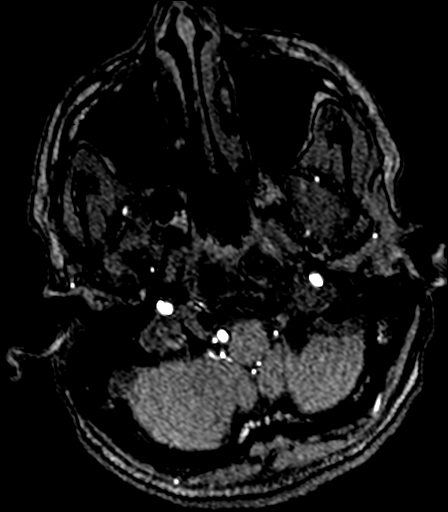
[im 16/149]
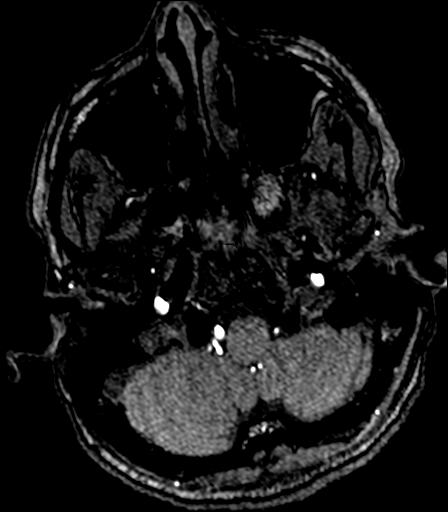
[im 19/149]
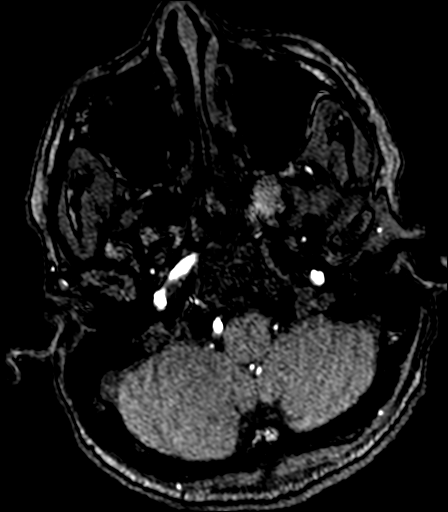
[im 23/149]
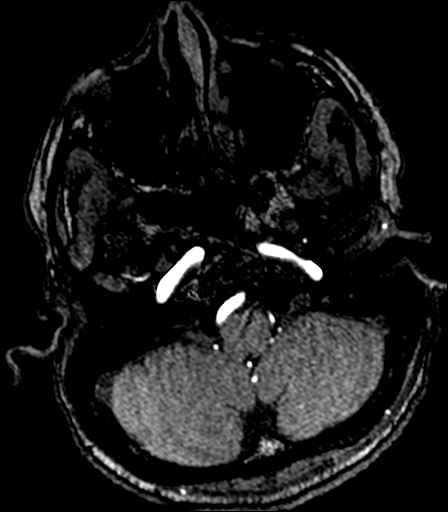
[im 26/149]
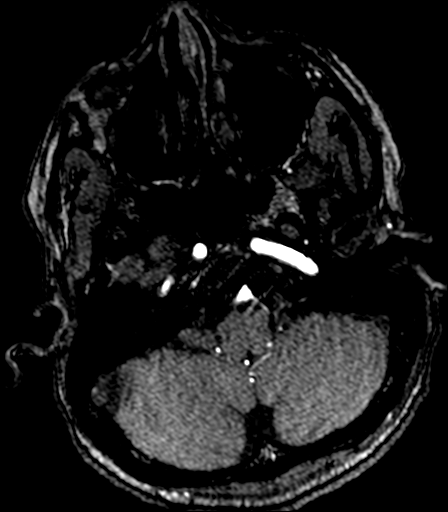
[im 29/149]
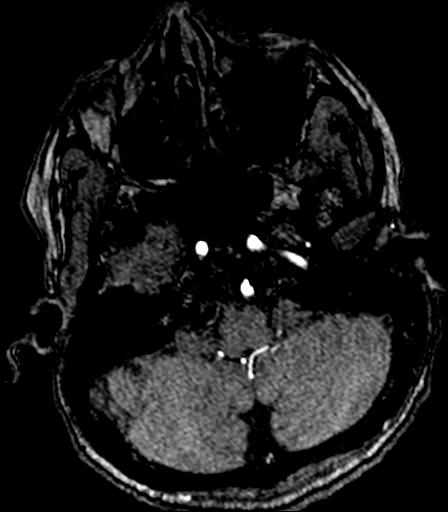
[im 32/149]
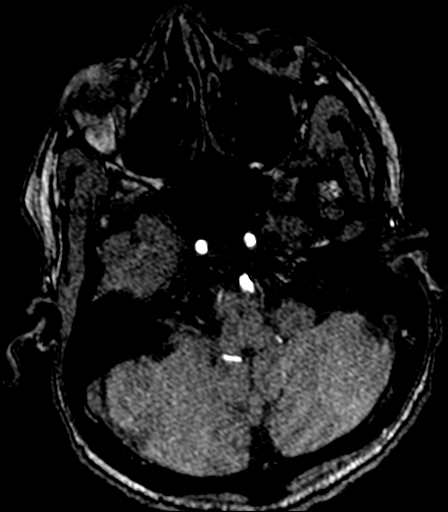
[im 35/149]
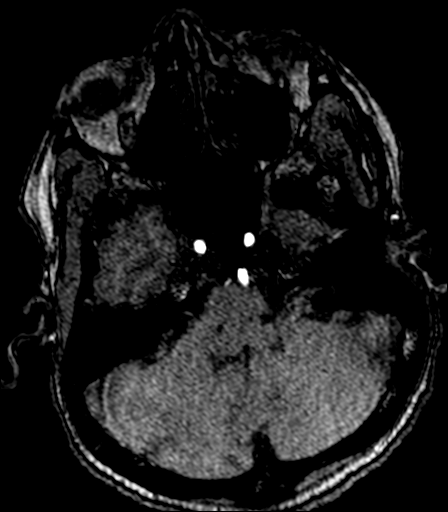
[im 38/149]
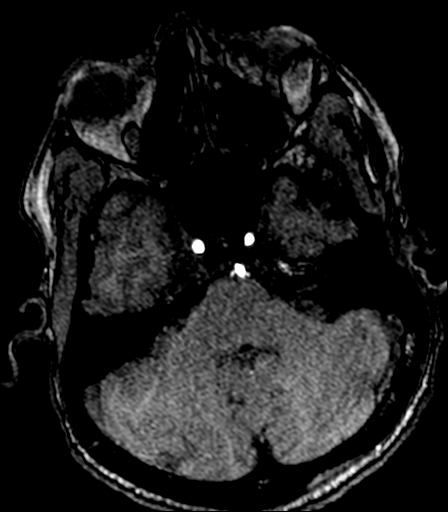
[im 41/149]
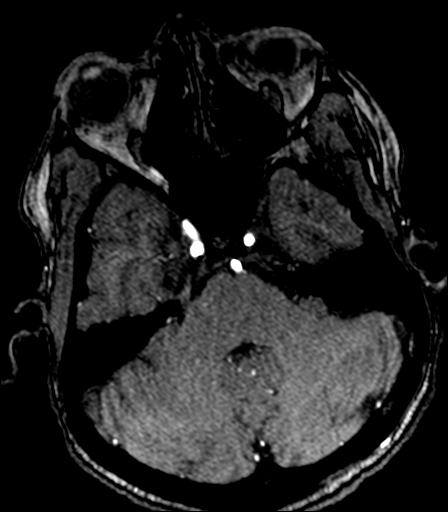
[im 48/149]
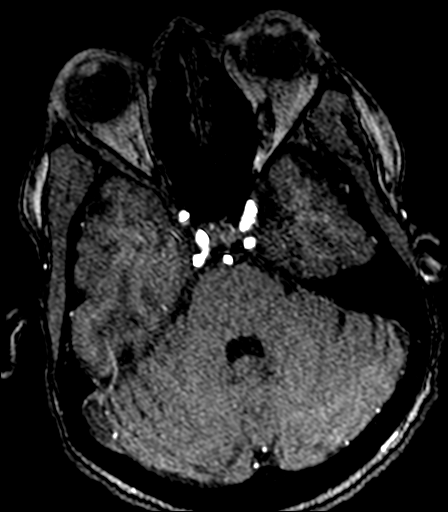
[im 67/149]
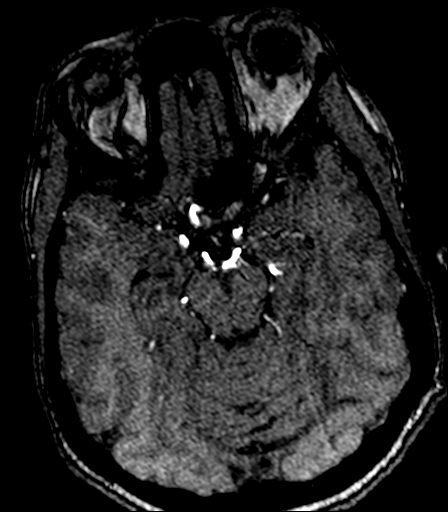
[im 76/149]
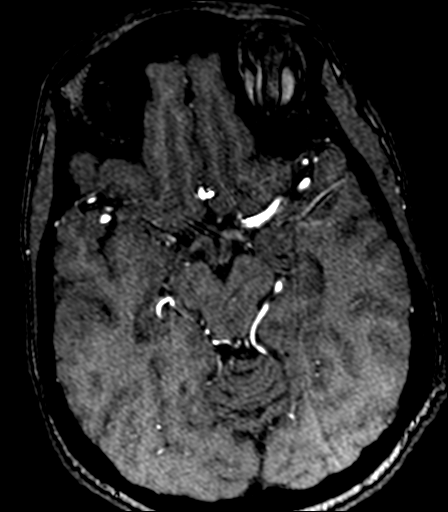
[im 86/149]
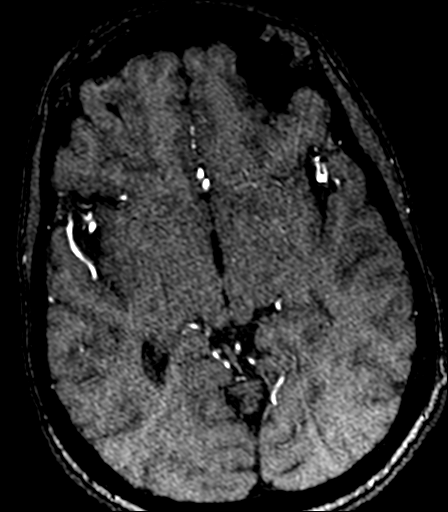
[im 104/149]
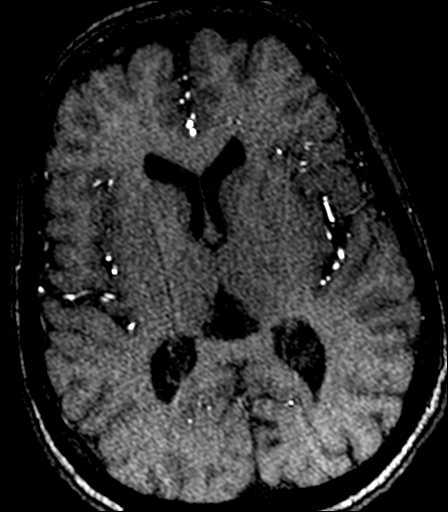
[im 123/149]
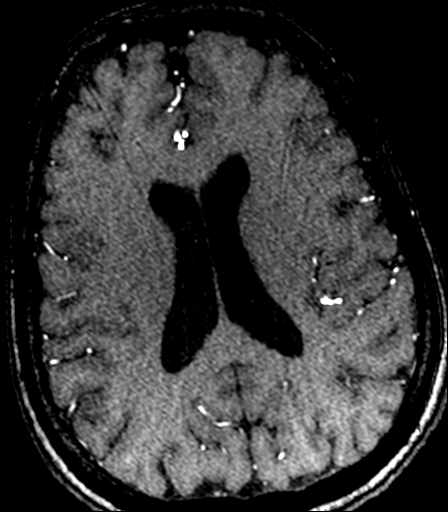
[im 126/149]
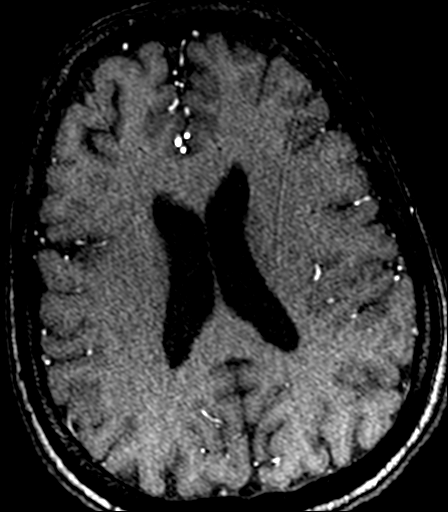
[im 142/149]
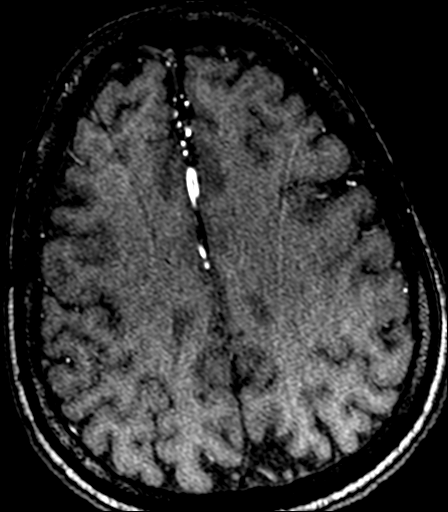

[22 of 48 positions shown; findings below may reference images not displayed]

FINDINGS: MRI HEAD FINDINGS

Brain: Minimal periventricular white matter T2/FLAIR hyperintense
foci. No acute infarct or intracranial hemorrhage. No midline shift,
ventriculomegaly or extra-axial fluid collection. No mass lesion. No
abnormal enhancement.

Skull and upper cervical spine: Normal marrow signal.

Sinuses/Orbits: Normal orbits. Clear paranasal sinuses. No mastoid
effusion.

Other: None.

MRA HEAD FINDINGS

Anterior circulation: Mild-to-moderate narrowing of the bilateral
distal A1 segments ([DATE]). 2-3 mm focal outpouching involving the
left ICA terminus ([DATE]). Query 1-2 mm inferiorly oriented focal
outpouching involving the right intracavernous ICA ([DATE]). No
proximal occlusion or vascular malformation.

Posterior circulation: Dominant right vertebral artery. 2-3 mm
saccular outpouching involving the left lateral aspect of the
proximal basilar artery ([DATE], [DATE]). Dorsally oriented 1-2 mm
focal outpouching involving the right P1 segment ([DATE], [DATE]). No
significant stenosis, proximal occlusion, or vascular malformation.

Venous sinuses: Focal filling defect within the dorsal superior
sagittal sinus with correlative T2 hyperintense signal ([DATE]) is
concerning for chronic nonocclusive thrombus. Remaining dural venous
sinuses appear patent.

Anatomic variants: None of significance.
IMPRESSION: No acute infarct or intracranial hemorrhage.

Nonocclusive thrombus involving the dorsal superior sagittal sinus,
likely chronic. Consider dedicated MRV for further evaluation.

1-3 mm focal outpouchings involving the right intracavernous ICA,
left ICA terminus, proximal basilar artery and right P1 segment.
These may reflect small aneurysms versus infundibula.

Mild-to-moderate bilateral A1 segment narrowing.

Minimal periventricular white matter T2 hyperintensities.
Differential includes chronic microvascular ischemic changes, post
infectious/inflammatory sequela and demyelination.

These results were called by telephone at the time of interpretation
on [DATE] at [DATE] to provider AUNG , who verbally
acknowledged these results.

## 2020-06-03 MED ORDER — GADOBENATE DIMEGLUMINE 529 MG/ML IV SOLN
10.0000 mL | Freq: Once | INTRAVENOUS | Status: AC | PRN
  Administered 2020-06-03: 10 mL via INTRAVENOUS

## 2020-06-03 NOTE — Progress Notes (Signed)
I called and spoke with Allison Leonard regarding MRI findings. 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 There may be some small aneurysms as well. She does not take birth control. I told her to start taking ASA 42m daily and advised her to discontinue sumatriptan. She will continue topiramate. Plan going forward will be MRV, CTA and bloodwork (hypercoagulable work up, thyroid).

## 2020-06-05 ENCOUNTER — Other Ambulatory Visit: Payer: Self-pay | Admitting: Family Medicine

## 2020-06-05 ENCOUNTER — Other Ambulatory Visit: Payer: Self-pay | Admitting: Nurse Practitioner

## 2020-06-05 DIAGNOSIS — R1115 Cyclical vomiting syndrome unrelated to migraine: Secondary | ICD-10-CM

## 2020-06-06 ENCOUNTER — Other Ambulatory Visit: Payer: Self-pay

## 2020-06-06 ENCOUNTER — Telehealth: Payer: Self-pay

## 2020-06-06 DIAGNOSIS — R519 Headache, unspecified: Secondary | ICD-10-CM

## 2020-06-06 DIAGNOSIS — G43019 Migraine without aura, intractable, without status migrainosus: Secondary | ICD-10-CM

## 2020-06-06 MED ORDER — UBRELVY 100 MG PO TABS: 1.0000 | ORAL_TABLET | ORAL | 0 refills | Status: DC | PRN

## 2020-06-06 NOTE — Telephone Encounter (Signed)
Spoke with pt and inst to come in for lab draw, check in here for appt with Endocrine lab on 2nd floor. She can also p/u Ubrelvy samples, 1 tab at onset of headache, 1 more tab after 2 hrs if needed, no more than 2 tabs in 24 hrs. Also will get prior auth for CTA and call her with the appointment date/time. She verbalized understanding of all inst.

## 2020-06-07 ENCOUNTER — Telehealth: Payer: Self-pay

## 2020-06-07 NOTE — Telephone Encounter (Signed)
Prior auth form for CTA head w w/o contrast faxed to [facility] [phone]

## 2020-06-08 ENCOUNTER — Other Ambulatory Visit: Payer: Self-pay

## 2020-06-08 ENCOUNTER — Other Ambulatory Visit: Payer: Medicaid Other

## 2020-06-10 ENCOUNTER — Other Ambulatory Visit: Payer: Medicaid Other

## 2020-06-10 ENCOUNTER — Other Ambulatory Visit: Payer: Self-pay

## 2020-06-10 DIAGNOSIS — G43019 Migraine without aura, intractable, without status migrainosus: Secondary | ICD-10-CM

## 2020-06-10 DIAGNOSIS — R519 Headache, unspecified: Secondary | ICD-10-CM

## 2020-06-13 LAB — FACTOR 5 LEIDEN

## 2020-06-14 ENCOUNTER — Telehealth: Payer: Self-pay

## 2020-06-14 NOTE — Telephone Encounter (Signed)
[employer] for CTA head w w/o contrast  Ref # [id number] Josem Kaufmann 06/14/2020-08/12/2020

## 2020-06-20 ENCOUNTER — Ambulatory Visit
Admission: RE | Admit: 2020-06-20 | Discharge: 2020-06-20 | Disposition: A | Discharge status: Home / Self Care | Payer: Medicaid Other | Source: Ambulatory Visit | Attending: Neurology | Admitting: Neurology

## 2020-06-20 ENCOUNTER — Other Ambulatory Visit: Payer: Self-pay

## 2020-06-20 DIAGNOSIS — I725 Aneurysm of other precerebral arteries: Secondary | ICD-10-CM | POA: Diagnosis not present

## 2020-06-20 DIAGNOSIS — G43019 Migraine without aura, intractable, without status migrainosus: Secondary | ICD-10-CM

## 2020-06-20 DIAGNOSIS — G43909 Migraine, unspecified, not intractable, without status migrainosus: Secondary | ICD-10-CM | POA: Diagnosis not present

## 2020-06-20 DIAGNOSIS — I671 Cerebral aneurysm, nonruptured: Secondary | ICD-10-CM | POA: Diagnosis not present

## 2020-06-20 DIAGNOSIS — R519 Headache, unspecified: Secondary | ICD-10-CM

## 2020-06-20 IMAGING — CT CT ANGIO HEAD
2 of 4 series · 6 of 30 positions shown · IV contrast (iopamidol)
Comparison: MRI brain and MRA head [DATE]

CLINICAL DATA: Worsening headaches. Intractable migraine without
HUN and without status migrainosus. Additional provided by scanning
technologist: Patient reports headaches, history of migraines for 9
years, worsening blurry vision with nausea/vomiting.

EXAM:
CT ANGIOGRAPHY HEAD
TECHNIQUE: Multidetector CT imaging of the head was performed using the
standard protocol during bolus administration of intravenous
contrast. Multiplanar CT image reconstructions and MIPs were
obtained to evaluate the vascular anatomy.
CONTRAST:  75mL [ZE] IOPAMIDOL ([ZE]) INJECTION 76%

[Series 3: head w/(date) · axial · 0.42mm/px · z∈[-128,-68]mm · 2 of 37 slices shown]
[im 13/37  brain]
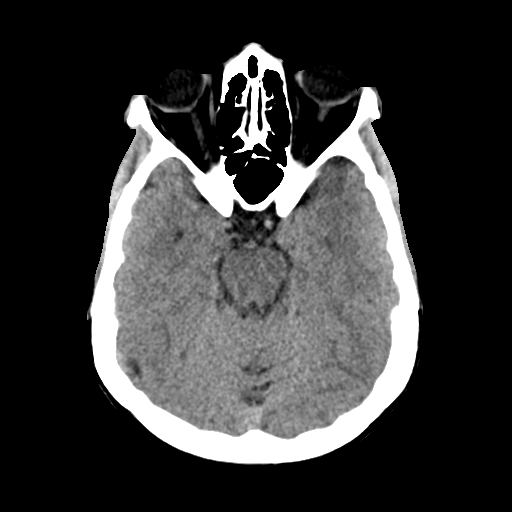
[im 25/37  brain]
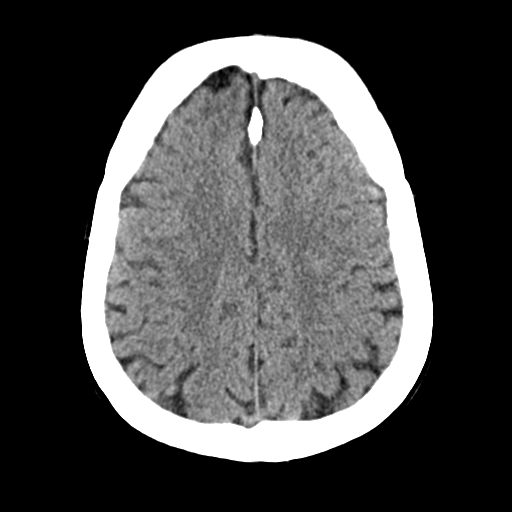

[Series 8: head angio · axial · 0.42mm/px · z∈[-153,-45]mm · 4 of 62 slices shown]
[im 13/62  brain]
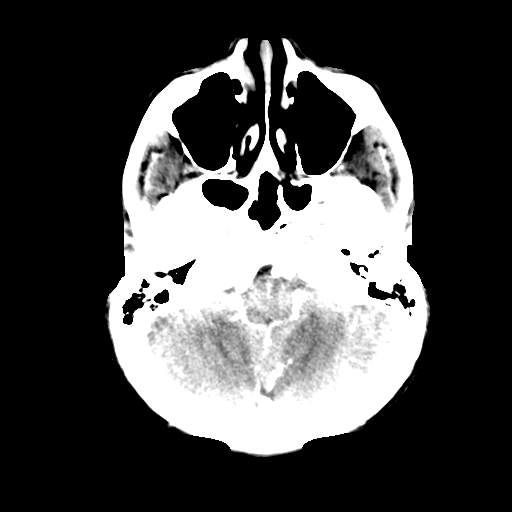
[im 25/62  bone]
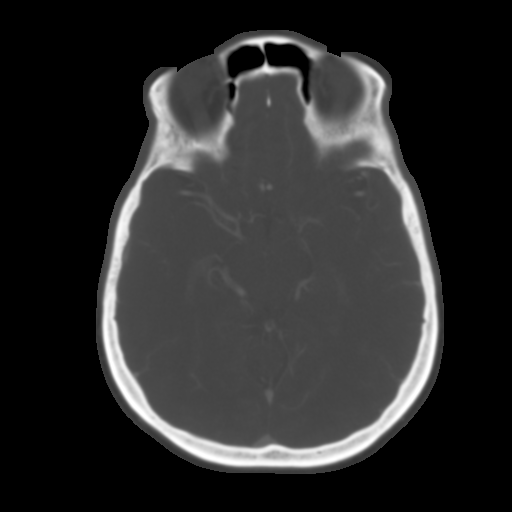
[im 37/62  brain]
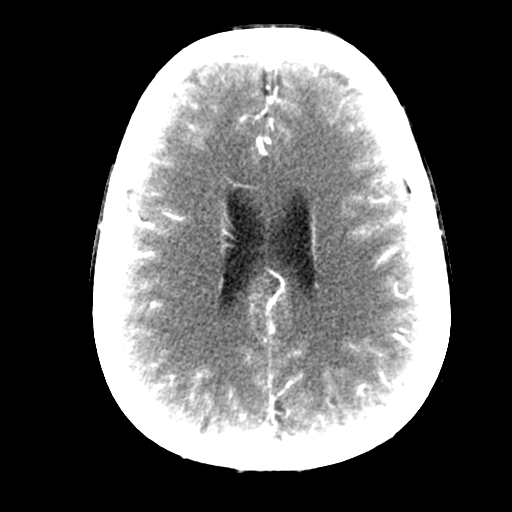
[im 49/62  bone]
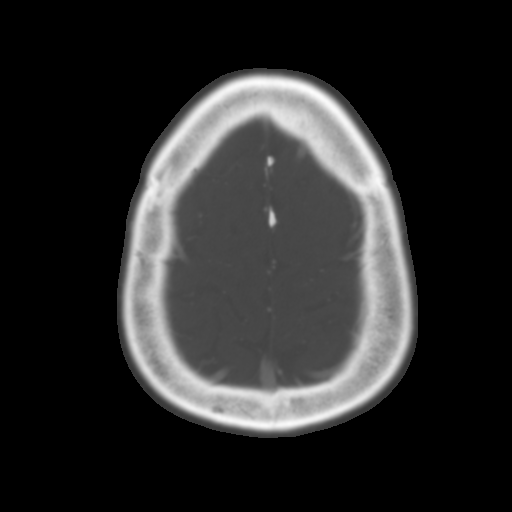

[6 of 30 positions shown; findings below may reference images not displayed]

FINDINGS: CT HEAD

Brain:

Cerebral volume is normal.

There is no acute intracranial hemorrhage.

No demarcated cortical infarct.

No extra-axial fluid collection.

No evidence of intracranial mass.

No midline shift.

Vascular: No hyperdense vessel.

Skull: Normal. Negative for fracture or focal lesion.

Sinuses: Subcentimeter osteoma within the right ethmoid sinuses. No
significant mastoid effusion.

Orbits: No acute abnormality.

CTA HEAD

Anterior circulation:

The intracranial internal carotid arteries are patent. The M1 middle
cerebral arteries are patent without significant stenosis. No M2
proximal branch occlusion or high-grade proximal stenosis is
identified. The anterior cerebral arteries are patent.

Redemonstrated 1-2 mm inferiorly projecting vascular protrude
arising from the distal cavernous/paraclinoid right ICA (series 13,
image 92). A previously questioned vascular protrusion arising from
the left ICA terminus is not appreciated on the current exam.

Posterior circulation:

The dominant intracranial right vertebral artery is patent without
significant stenosis. The non dominant intracranial left vertebral
artery is developmentally diminutive, but patent. The basilar artery
is patent without significant stenosis. The posterior cerebral
arteries are patent. Posterior communicating arteries are present
bilaterally.

Redemonstrated 2 mm left lateral projecting vascular protrusion
arising from the proximal basilar artery likely reflecting a small
aneurysm (series 16, image 38). A previously questioned aneurysm
arising from the P1 right PCA is not appreciated.

Venous sinuses: Within limitations of contrast timing, no convincing
thrombus. Prominent arachnoid granulations within the dorsal aspect
of the superior sagittal sinus

Anatomic variants: None significant.
IMPRESSION: CT head:

Unremarkable non-contrast CT appearance of the brain. No evidence of
acute intracranial abnormality.

CTA head:

1. No intracranial large vessel occlusion or proximal high-grade
arterial stenosis.
2. Redemonstrated 1-2 mm inferiorly projecting vascular protrusion
arising from the distal cavernous/paraclinoid right ICA which may
reflect a small aneurysm.
3. Redemonstrated 2 mm aneurysm arising from the proximal basilar
artery.
4. Small aneurysms of the P1 right PCA and left ICA terminus
questioned on the prior MRA of [DATE] are not appreciated.

## 2020-06-20 MED ORDER — IOPAMIDOL (ISOVUE-370) INJECTION 76%
75.0000 mL | Freq: Once | INTRAVENOUS | Status: AC | PRN
  Administered 2020-06-20: 75 mL via INTRAVENOUS

## 2020-06-21 ENCOUNTER — Other Ambulatory Visit: Payer: Self-pay | Admitting: Family Medicine

## 2020-06-21 ENCOUNTER — Telehealth: Payer: Self-pay

## 2020-06-21 DIAGNOSIS — R519 Headache, unspecified: Secondary | ICD-10-CM

## 2020-06-21 DIAGNOSIS — R1115 Cyclical vomiting syndrome unrelated to migraine: Secondary | ICD-10-CM

## 2020-06-21 MED ORDER — METOCLOPRAMIDE HCL 10 MG PO TABS: 10.0000 mg | ORAL_TABLET | Freq: Four times a day (QID) | ORAL | 0 refills | Status: DC

## 2020-06-21 NOTE — Telephone Encounter (Signed)
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  However, based on their location, I would like her to be evaluated by interventional radiology.  She may need a cerebral angiogram for better evaluation to assess if they may require interventional management.

## 2020-06-21 NOTE — Telephone Encounter (Signed)
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 However, based on their location, I would like her to be evaluated by interventional radiology. She may need a cerebral angiogram for better evaluation to assess if they may require interventional management  Told pt that referral would be placed and someone should reach out to her for an appointment, she verbalized understanding.

## 2020-06-28 LAB — HYPERCOAGULABLE PANEL, COMPREHENSIVE
APTT: 27.1 s
AT III Act/Nor PPP Chro: 116 %
Act. Prt C Resist w/FV Defic.: 2.7 ratio
Anticardiolipin Ab, IgG: <10 [GPL'U]
Anticardiolipin Ab, IgM: <10 [MPL'U]
Beta-2 Glycoprotein I, IgA: <10 SAU
Beta-2 Glycoprotein I, IgG: <10 SGU
Beta-2 Glycoprotein I, IgM: <10 SMU
DRVVT Screen Seconds: 31.8 s
Factor VII Antigen**: 115 %
Factor VIII Activity: 78 %
Hexagonal Phospholipid Neutral: 0 s
Homocysteine: 7.1 umol/L
Prot C Ag Act/Nor PPP Imm: 91 %
Prot S Ag Act/Nor PPP Imm: 91 %
Protein C Ag/FVII Ag Ratio**: 0.8 ratio
Protein S Ag/FVII Ag Ratio**: 0.8 ratio

## 2020-06-29 ENCOUNTER — Telehealth: Payer: Self-pay

## 2020-06-29 NOTE — Telephone Encounter (Signed)
Called patient and informed her of results. Pt verbalized understanding

## 2020-06-29 NOTE — Telephone Encounter (Signed)
-----   Message from Pieter Partridge, DO sent at 06/28/2020  3:35 PM EDT ----- Labs do not reveal anything that would cause the blood to clot fast.

## 2020-07-04 ENCOUNTER — Other Ambulatory Visit: Payer: Self-pay | Admitting: Family Medicine

## 2020-07-07 ENCOUNTER — Other Ambulatory Visit: Payer: Self-pay | Admitting: Family Medicine

## 2020-07-07 DIAGNOSIS — R1115 Cyclical vomiting syndrome unrelated to migraine: Secondary | ICD-10-CM

## 2020-07-30 ENCOUNTER — Other Ambulatory Visit: Payer: Self-pay | Admitting: Family Medicine

## 2020-08-04 ENCOUNTER — Other Ambulatory Visit: Payer: Self-pay | Admitting: Family Medicine

## 2020-08-04 DIAGNOSIS — R1115 Cyclical vomiting syndrome unrelated to migraine: Secondary | ICD-10-CM

## 2020-08-23 ENCOUNTER — Other Ambulatory Visit: Payer: Self-pay | Admitting: Family Medicine

## 2020-08-23 DIAGNOSIS — R1115 Cyclical vomiting syndrome unrelated to migraine: Secondary | ICD-10-CM

## 2020-08-27 ENCOUNTER — Other Ambulatory Visit: Payer: Self-pay | Admitting: Family Medicine

## 2020-09-05 ENCOUNTER — Telehealth: Payer: Self-pay | Admitting: Neurology

## 2020-09-05 ENCOUNTER — Other Ambulatory Visit: Payer: Self-pay | Admitting: Neurology

## 2020-09-05 ENCOUNTER — Encounter: Payer: Self-pay | Admitting: Neurology

## 2020-09-05 MED ORDER — UBRELVY 100 MG PO TABS: 1.0000 | ORAL_TABLET | ORAL | 5 refills | Status: DC | PRN

## 2020-09-05 NOTE — Progress Notes (Signed)
Subjective:  Allison Leonard (Key: [id number]) Ubrelvy 100MG tablets   Form [facility] [facility] Electronic [place] Form [phone] NCPDP) Created 4 minutes ago Sent to Plan 3 minutes ago Plan Response 3 minutes ago Submit Clinical Questions 1 minute ago Determination Favorable 1 minute ago Message from Plan PA Case: [id number], Status: Approved, Coverage Starts on: 09/05/2020 12:00:00 AM, Coverage Ends on: 09/05/2021 12:00:00 AM.

## 2020-09-05 NOTE — Progress Notes (Signed)
Allison Leonard (Key: [id number]) Rx #: [id number] Ubrelvy 100MG tablets   Form [employer] [facility] Electronic [place] Form [phone] NCPDP) Created 14 minutes ago Sent to Plan 4 minutes ago Plan Response 4 minutes ago Submit Clinical Questions less than a minute ago Determination Wait for Determination Please wait for [facility] [facility] to return a determination.

## 2020-09-05 NOTE — Telephone Encounter (Signed)
Done

## 2020-09-05 NOTE — Telephone Encounter (Signed)
Called to check on the pt after pt called the after hours line.  Per pt States she is feeling a lot better today. Pt had to take up to three Topamax at bedtime the headache was so bad. Pt states she only had the samples of Ubrlevy from her visit so she was unable to take that. Pt would like to have a script sent into her [facility] on file.

## 2020-09-20 NOTE — Progress Notes (Deleted)
NEUROLOGY FOLLOW UP OFFICE NOTE  JESSLYN VIGLIONE [id number]   Subjective:  Allison Leonard is a 44 year old female with Celiac disease and Cyclic Vomiting Syndrome who follows up for migraines.  MRI/MRA of brain and CTA of head personally reviewed.  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  Follow up CTA of head on 06/20/2020 showed 1-2 mm right cavernous protrusion (possibly aneurysm) and 2 mm aneurysm from proximal basilar artery but small aneurysms of the right P1 PCA and left ICA terminus not seen.  Due to findings, she was taken off of sumatriptan and prescribed Ubrelvy for migraine rescue.  Hypercoagulable panel and Factor V Leiden gene mutation from August were negative.  She was referred to interventional radiology for further evaluation and possible endovascular therapy but ***.    Intensity:  severe Duration:  Sumatriptan eases headache after an hour but not aborts it.  Ineffective if she wakes up with headache. Frequency:  13 headaches in last 30 days Current NSAIDS:none Current analgesics:none Current triptans:sumatriptan 40m NS Current ergotamine:none Current anti-emetic:Reglan 171m promethazine 12.72m30murrent muscle relaxants:none Current anti-anxiolytic:Lorazepam 1mg772mN Current sleep aide:none Current Antihypertensive medications:none Current Antidepressant medications:none Current Anticonvulsant medications:topiramate 100mg37mbedtime Current anti-CGRP:none Current Vitamins/Herbal/Supplements:none Current Antihistamines/Decongestants:none Other  therapy:none Hormone/birth control:none  Caffeine:Coffee 1 to 2 times a month. No soda Exercise:no Depression:no; Anxiety:no Other pain:Sometimes abdominal discomfort Sleep hygiene:Restless  HISTORY: Started having headaches at age 8. W55se over the past 3 to 4 years. They are mostly severe sharp or pounding headache in her right temple. Usually lasts a day. Sometimes they wake her up at night. 17 days over last 30 days. Nausea, vomiting, photophobia, phonophobia, blurred vision. No numbness or weakness. No specific triggers. Vomiting may help make it better.   Past NSAIDS:ibuprofen Past analgesics:Fioricet, Excedrin Past abortive triptans:sumatriptan 100mg 21m abortive ergotamine:none Past muscle relaxants:none Past anti-emetic:Compazine Past antihypertensive medications:none Past antidepressant medications:none Past anticonvulsant medications:none Past anti-CGRP:none Past vitamins/Herbal/Supplements:none Past antihistamines/decongestants:Benadryl Other past therapies:none  Family history of headache:Brother, sister, mother, daughter   PAST MEDICAL HISTORY: Past Medical History:  Diagnosis Date  . Acid reflux 07/04/2018  . Anemia   . Celiac disease/sprue   . Cyclic vomiting syndrome   . Generalized anxiety disorder 07/04/2018  . History of colon polyps    last colonoscopy 2018 removed multiple benign polyps  . Migraines 07/04/2018    MEDICATIONS: Current Outpatient Medications on File Prior to Visit  Medication Sig Dispense Refill  . pantoprazole (PROTONIX) 20 MG tablet TAKE 1 TABLET BY MOUTH EVERY DAY 30 tablet 1  . prochlorperazine (COMPAZINE) 10 MG tablet TAKE 1 TABLET (10 MG TOTAL) BY MOUTH EVERY 8 (EIGHT) HOURS AS NEEDED FOR NAUSEA OR VOMITING. 90 tablet 0  . promethazine (PHENERGAN) 25 MG tablet TAKE 1 TABLET (25 MG TOTAL) BY MOUTH EVERY 6 (SIX) HOURS AS NEEDED FOR NAUSEA OR VOMITING. 12 tablet 0  .  dicyclomine (BENTYL) 20 MG tablet Take 1 tablet (20 mg total) by mouth 3 (three) times daily as needed for spasms. 90 tablet 5  . famotidine (PEPCID) 20 MG tablet TAKE 1 TABLET BY MOUTH TWICE A DAY 60 tablet 0  . Hyoscyamine Sulfate SL (LEVSIN/SL)  0.125 MG SUBL Place 1 each under the tongue 4 (four) times daily as needed for up to 5 days. 30 tablet 0  . lidocaine (XYLOCAINE) 2 % solution Use as directed 15 mLs in the mouth or throat every 6 (six) hours as needed for mouth pain. (Patient not taking: Reported on 04/15/2020) 100 mL 0  . LORazepam (ATIVAN) 0.5 MG tablet Take 2 tablets (1 mg total) by mouth 3 (three) times daily as needed (nausea and vomiting). (Patient not taking: Reported on 04/15/2020) 30 tablet 0  . metoCLOPramide (REGLAN) 10 MG tablet Take 1 tablet (10 mg total) by mouth every 6 (six) hours. 15 tablet 0  . promethazine (PHENERGAN) 25 MG suppository Place 1 suppository (25 mg total) rectally every 6 (six) hours as needed for nausea or vomiting. 12 each 0  . SUMAtriptan (IMITREX) 20 MG/ACT nasal spray Place 1 spray into nose at earliest onset of migraine.  May repeat in 2 hours if headache persists or recurs.  Maximum 2 sprays in 24 hours 1 Inhaler 5  . topiramate (TOPAMAX) 100 MG tablet Take 1 tablet (100 mg total) by mouth at bedtime. 30 tablet 5  . Ubrogepant (UBRELVY) 100 MG TABS Take 1 tablet by mouth as needed (May repeat 1 tablet in 2 hours.  Maximum 2 tablets in 24 hours.). 10 tablet 5  . [DISCONTINUED] pantoprazole (PROTONIX) 20 MG tablet Take 1 tablet (20 mg total) by mouth daily. 30 tablet 1   Current Facility-Administered Medications on File Prior to Visit  Medication Dose Route Frequency Provider Last Rate Last Admin  . 0.9 %  sodium chloride infusion  500 mL Intravenous Once Thornton Park, MD        ALLERGIES: Allergies  Allergen Reactions  . Zofran [Ondansetron Hcl] Anaphylaxis  . Morphine And Related Itching    FAMILY HISTORY: Family History  Problem  Relation Age of Onset  . Diabetes Father   . Hypertension Father   . Hyperlipidemia Father   . Colon polyps Father   . Heart attack Maternal Grandmother   . Heart attack Paternal Grandmother   . Cervical cancer Maternal Aunt   . Stomach cancer Maternal Aunt   . Throat cancer Maternal Uncle   . Stroke Maternal Uncle   . Down syndrome Son   . Colon cancer Sister   . Esophageal cancer Neg Hx   . Rectal cancer Neg Hx     SOCIAL HISTORY: Social History   Socioeconomic History  . Marital status: Single    Spouse name: Not on file  . Number of children: 3  . Years of education: Not on file  . Highest education level: Not on file  Occupational History  . Not on file  Tobacco Use  . Smoking status: Current Some Day Smoker    Packs/day: 0.50    Years: 7.00    Pack years: 3.50    Types: Cigarettes  . Smokeless tobacco: Never Used  Vaping Use  . Vaping Use: Never used  Substance and Sexual Activity  . Alcohol use: Not Currently  . Drug use: Not Currently    Types: Marijuana    Comment: 04/11/20,last  had  . Sexual activity: Not on file  Other Topics Concern  . Not on file  Social History Narrative   Right handed   One  Story   Drinks coffee occasionally   Social Determinants of Health   Financial Resource Strain:   . Difficulty of Paying Living Expenses: Not on file  Food  Insecurity:   . Worried About Charity fundraiser in the Last Year: Not on file  . Ran Out of Food in the Last Year: Not on file  Transportation Needs:   . Lack of Transportation (Medical): Not on file  . Lack of Transportation (Non-Medical): Not on file  Physical Activity:   . Days of Exercise per Week: Not on file  . Minutes of Exercise per Session: Not on file  Stress:   . Feeling of Stress : Not on file  Social Connections:   . Frequency of Communication with Friends and Family: Not on file  . Frequency of Social Gatherings with Friends and Family: Not on file  . Attends Religious Services:  Not on file  . Active Member of Clubs or Organizations: Not on file  . Attends Archivist Meetings: Not on file  . Marital Status: Not on file  Intimate Partner Violence:   . Fear of Current or Ex-Partner: Not on file  . Emotionally Abused: Not on file  . Physically Abused: Not on file  . Sexually Abused: Not on file     Objective:  *** General: No acute distress.  Patient appears well-groomed.   Head:  Normocephalic/atraumatic Eyes:  Fundi examined but not visualized Neck: supple, no paraspinal tenderness, full range of motion Heart:  Regular rate and rhythm Lungs:  Clear to auscultation bilaterally Back: No paraspinal tenderness Neurological Exam: alert and oriented to person, place, and time. Attention span and concentration intact, recent and remote memory intact, fund of knowledge intact.  Speech fluent and not dysarthric, language intact.  CN II-XII intact. Bulk and tone normal, muscle strength 5/5 throughout.  Sensation to light touch, temperature and vibration intact.  Deep tendon reflexes 2+ throughout, toes downgoing.  Finger to nose and heel to shin testing intact.  Gait normal, Romberg negative.   Assessment/Plan:   1.  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  Hypercoagulable workup negative.  ***  Metta Clines, DO  CC: Lyndee Hensen, DO

## 2020-09-21 ENCOUNTER — Ambulatory Visit: Payer: Medicaid Other | Admitting: Neurology

## 2020-09-21 ENCOUNTER — Other Ambulatory Visit: Payer: Self-pay | Admitting: Family Medicine

## 2020-10-07 ENCOUNTER — Other Ambulatory Visit: Payer: Self-pay | Admitting: Family Medicine

## 2020-10-07 DIAGNOSIS — R1115 Cyclical vomiting syndrome unrelated to migraine: Secondary | ICD-10-CM

## 2020-10-15 ENCOUNTER — Other Ambulatory Visit: Payer: Self-pay | Admitting: Family Medicine

## 2020-11-03 ENCOUNTER — Other Ambulatory Visit: Payer: Self-pay | Admitting: Nurse Practitioner

## 2020-11-03 ENCOUNTER — Other Ambulatory Visit: Payer: Self-pay | Admitting: Neurology

## 2020-11-03 ENCOUNTER — Other Ambulatory Visit: Payer: Self-pay | Admitting: Family Medicine

## 2020-11-03 DIAGNOSIS — R1115 Cyclical vomiting syndrome unrelated to migraine: Secondary | ICD-10-CM

## 2020-11-05 DIAGNOSIS — I671 Cerebral aneurysm, nonruptured: Secondary | ICD-10-CM

## 2020-11-05 HISTORY — DX: Cerebral aneurysm, nonruptured: I67.1

## 2020-11-16 ENCOUNTER — Other Ambulatory Visit: Payer: Self-pay | Admitting: Family Medicine

## 2020-11-27 NOTE — Progress Notes (Deleted)
NEUROLOGY FOLLOW UP OFFICE NOTE  [name] [id number]   Subjective:  Allison Leonard is a 45 year old female with Celiac disease and Cyclic Vomiting Syndrome who follows up for migraines.  UPDATE: Due to worsening headaches, she had an MRI of brain with and without contrast and MRA of head performed on 06/03/2020 which was personally reviewed and demonstrated a probable chronic nonocclusive thrombus in the dorsal superior sagittal sinus as well as 1-3 mm focal outpouchings involving the right intracavernous ICA, left ICA terminus, proximal basilar artery and right P1 segment indicating small aneurysms which were confirmed on follow up CTA on 06/20/2020. I referred her to interventional radiology ***. She was advised to start ASA 54m daily and discontinue sumatriptan and instead tried UIran  MRV was ordered but not performed, ***.  Hypercoagulable workup including Factor V Leiden was negative.  Topiramate was increased to 1042mat bedtime in June.  *** Intensity:  *** Duration:  *** Frequency:  13 headaches in last 30 days Current NSAIDS:none Current analgesics:none Current triptans:none Current ergotamine:none Current anti-emetic:Reglan 1078mpromethazine 12.5mg89mrrent muscle relaxants:none Current anti-anxiolytic:Lorazepam 1mg 35m Current sleep aide:none Current Antihypertensive medications:none Current Antidepressant medications:none Current Anticonvulsant medications:topiramate 100mg 59medtime Current anti-CGRP:Ubrelvy 100mg C74mnt Vitamins/Herbal/Supplements:none Current Antihistamines/Decongestants:none Other therapy:none Hormone/birth control:none  Caffeine:Coffee 1 to 2 times a month. No soda Exercise:no Depression:no; Anxiety:no Other pain:Sometimes abdominal discomfort Sleep hygiene:Restless  HISTORY: Started having headaches at age 87. Wor5 over the past 3 to 4 years. They are mostly  severe sharp or pounding headache in her right temple. Usually lasts a day. Sometimes they wake her up at night. 17 days over last 30 days. Nausea, vomiting, photophobia, phonophobia, blurred vision. No numbness or weakness. No specific triggers. Vomiting may help make it better.   Past NSAIDS:ibuprofen Past analgesics:Fioricet, Excedrin Past abortive triptans:sumatriptan 100mg, s33mriptan 20mg NS 25m abortive ergotamine:none Past muscle relaxants:none Past anti-emetic:Compazine Past antihypertensive medications:none Past antidepressant medications:none Past anticonvulsant medications:none Past anti-CGRP:none Past vitamins/Herbal/Supplements:none Past antihistamines/decongestants:Benadryl Other past therapies:none  Family history of headache:Brother, sister, mother, daughter  PAST MEDICAL HISTORY: Past Medical History:  Diagnosis Date  . Acid reflux 07/04/2018  . Anemia   . Celiac disease/sprue   . Cyclic vomiting syndrome   . Generalized anxiety disorder 07/04/2018  . History of colon polyps    last colonoscopy 2018 removed multiple benign polyps  . Migraines 07/04/2018    MEDICATIONS: Current Outpatient Medications on File Prior to Visit  Medication Sig Dispense Refill  . promethazine (PHENERGAN) 25 MG tablet TAKE 1 TABLET (25 MG TOTAL) BY MOUTH EVERY 6 (SIX) HOURS AS NEEDED FOR NAUSEA OR VOMITING. 12 tablet 0  . dicyclomine (BENTYL) 20 MG tablet Take 1 tablet (20 mg total) by mouth 3 (three) times daily as needed for spasms. 90 tablet 5  . famotidine (PEPCID) 20 MG tablet TAKE 1 TABLET BY MOUTH TWICE A DAY 60 tablet 0  . Hyoscyamine Sulfate SL (LEVSIN/SL) 0.125 MG SUBL Place 1 each under the tongue 4 (four) times daily as needed for up to 5 days. 30 tablet 0  . lidocaine (XYLOCAINE) 2 % solution Use as directed 15 mLs in the mouth or throat every 6 (six) hours as needed for mouth pain. (Patient not taking: Reported on 04/15/2020) 100 mL 0   . LORazepam (ATIVAN) 0.5 MG tablet Take 2 tablets (1 mg total) by mouth 3 (three) times daily as needed (nausea and vomiting). (Patient not taking: Reported on 04/15/2020) 30 tablet 0  . metoCLOPramide (REGLAN)  10 MG tablet Take 1 tablet (10 mg total) by mouth every 6 (six) hours. 15 tablet 0  . pantoprazole (PROTONIX) 20 MG tablet TAKE 1 TABLET BY MOUTH EVERY DAY 30 tablet 1  . prochlorperazine (COMPAZINE) 10 MG tablet TAKE 1 TABLET (10 MG TOTAL) BY MOUTH EVERY 8 (EIGHT) HOURS AS NEEDED FOR NAUSEA OR VOMITING. 90 tablet 0  . promethazine (PHENERGAN) 25 MG suppository Place 1 suppository (25 mg total) rectally every 6 (six) hours as needed for nausea or vomiting. 12 each 0  . SUMAtriptan (IMITREX) 20 MG/ACT nasal spray Place 1 spray into nose at earliest onset of migraine.  May repeat in 2 hours if headache persists or recurs.  Maximum 2 sprays in 24 hours 1 Inhaler 5  . topiramate (TOPAMAX) 100 MG tablet Take 1 tablet (100 mg total) by mouth at bedtime. 30 tablet 5  . Ubrogepant (UBRELVY) 100 MG TABS Take 1 tablet by mouth as needed (May repeat 1 tablet in 2 hours.  Maximum 2 tablets in 24 hours.). 10 tablet 5   Current Facility-Administered Medications on File Prior to Visit  Medication Dose Route Frequency Provider Last Rate Last Admin  . 0.9 %  sodium chloride infusion  500 mL Intravenous Once Thornton Park, MD        ALLERGIES: Allergies  Allergen Reactions  . Zofran [Ondansetron Hcl] Anaphylaxis  . Morphine And Related Itching    FAMILY HISTORY: Family History  Problem Relation Age of Onset  . Diabetes Father   . Hypertension Father   . Hyperlipidemia Father   . Colon polyps Father   . Heart attack Maternal Grandmother   . Heart attack Paternal Grandmother   . Cervical cancer Maternal Aunt   . Stomach cancer Maternal Aunt   . Throat cancer Maternal Uncle   . Stroke Maternal Uncle   . Down syndrome Son   . Colon cancer Sister   . Esophageal cancer Neg Hx   . Rectal  cancer Neg Hx     SOCIAL HISTORY: Social History   Socioeconomic History  . Marital status: Single    Spouse name: Not on file  . Number of children: 3  . Years of education: Not on file  . Highest education level: Not on file  Occupational History  . Not on file  Tobacco Use  . Smoking status: Current Some Day Smoker    Packs/day: 0.50    Years: 7.00    Pack years: 3.50    Types: Cigarettes  . Smokeless tobacco: Never Used  Vaping Use  . Vaping Use: Never used  Substance and Sexual Activity  . Alcohol use: Not Currently  . Drug use: Not Currently    Types: Marijuana    Comment: 04/11/20,last  had  . Sexual activity: Not on file  Other Topics Concern  . Not on file  Social History Narrative   Right handed   One  Story   Drinks coffee occasionally   Social Determinants of Health   Financial Resource Strain: Not on file  Food Insecurity: Not on file  Transportation Needs: Not on file  Physical Activity: Not on file  Stress: Not on file  Social Connections: Not on file  Intimate Partner Violence: Not on file     Objective:  *** General: No acute distress.  Patient appears well-groomed.   Head:  Normocephalic/atraumatic Eyes:  Fundi examined but not visualized Neck: supple, no paraspinal tenderness, full range of motion Heart:  Regular rate and rhythm Lungs:  Clear to auscultation bilaterally Back: No paraspinal tenderness Neurological Exam: alert and oriented to person, place, and time. Attention span and concentration intact, recent and remote memory intact, fund of knowledge intact.  Speech fluent and not dysarthric, language intact.  CN II-XII intact. Bulk and tone normal, muscle strength 5/5 throughout.  Sensation to light touch, temperature and vibration intact.  Deep tendon reflexes 2+ throughout, toes downgoing.  Finger to nose and heel to shin testing intact.  Gait normal, Romberg negative.   Assessment/Plan:   1.  Migraine without aura, without status  migrainosus, not intractable 2.  Chronic sagittal sinus thrombosis, incidental finding 3.  Cerebral aneurysms  1. Refer to interventional radiology 2.  Migraine prevention:  *** 3.  Migraine rescue:  Ubrelvy 164m.  Triptans contraindicated. 4.  ***  AMetta Clines DO  CC: VLyndee Hensen DO

## 2020-11-28 ENCOUNTER — Ambulatory Visit: Payer: Medicaid Other | Admitting: Neurology

## 2020-12-03 ENCOUNTER — Other Ambulatory Visit: Payer: Self-pay | Admitting: Family Medicine

## 2020-12-09 ENCOUNTER — Other Ambulatory Visit: Payer: Self-pay | Admitting: Gastroenterology

## 2020-12-16 ENCOUNTER — Other Ambulatory Visit: Payer: Self-pay | Admitting: Family Medicine

## 2020-12-16 DIAGNOSIS — R1115 Cyclical vomiting syndrome unrelated to migraine: Secondary | ICD-10-CM

## 2021-01-11 ENCOUNTER — Other Ambulatory Visit: Payer: Self-pay | Admitting: Gastroenterology

## 2021-01-26 ENCOUNTER — Other Ambulatory Visit: Payer: Self-pay | Admitting: Family Medicine

## 2021-01-26 ENCOUNTER — Other Ambulatory Visit: Payer: Self-pay | Admitting: Neurology

## 2021-01-26 DIAGNOSIS — R1115 Cyclical vomiting syndrome unrelated to migraine: Secondary | ICD-10-CM

## 2021-02-02 ENCOUNTER — Other Ambulatory Visit: Payer: Self-pay | Admitting: Family Medicine

## 2021-02-02 DIAGNOSIS — R1115 Cyclical vomiting syndrome unrelated to migraine: Secondary | ICD-10-CM

## 2021-03-03 ENCOUNTER — Other Ambulatory Visit: Payer: Self-pay | Admitting: Family Medicine

## 2021-03-03 DIAGNOSIS — R1115 Cyclical vomiting syndrome unrelated to migraine: Secondary | ICD-10-CM

## 2021-03-10 ENCOUNTER — Encounter: Payer: Self-pay | Admitting: Family Medicine

## 2021-03-10 ENCOUNTER — Other Ambulatory Visit: Payer: Self-pay

## 2021-03-10 ENCOUNTER — Ambulatory Visit (INDEPENDENT_AMBULATORY_CARE_PROVIDER_SITE_OTHER): Payer: Medicaid Other | Admitting: Family Medicine

## 2021-03-10 VITALS — BP 98/60 | HR 112 | Ht 64.0 in | Wt 109.0 lb

## 2021-03-10 DIAGNOSIS — R1115 Cyclical vomiting syndrome unrelated to migraine: Secondary | ICD-10-CM | POA: Diagnosis not present

## 2021-03-10 DIAGNOSIS — Z1159 Encounter for screening for other viral diseases: Secondary | ICD-10-CM

## 2021-03-10 MED ORDER — PROMETHAZINE HCL 25 MG PO TABS
25.0000 mg | ORAL_TABLET | Freq: Four times a day (QID) | ORAL | 0 refills | Status: DC | PRN
Start: 2021-03-10 — End: 2021-03-29

## 2021-03-10 NOTE — Patient Instructions (Signed)
It was great seeing you today!  Visit Remembers: - Stop by the pharmacy to pick up your prescriptions  - Continue to work on your healthy eating habits and incorporating exercise into your daily life.  - To Do: Call your gut doctors for a follow up appointment.   Call us if: Your vomiting does not improve with treatment. Go to the ED if you are unable to drink liquids or have decreased urination.    Regarding lab work today:  Due to recent changes in healthcare laws, you may see the results of your imaging and laboratory studies on MyChart before your provider has had a chance to review them.  I understand that in some cases there may be results that are confusing or concerning to you. Not all laboratory results come back in the same time frame and you may be waiting for multiple results in order to interpret others.  Please give [place] 72 hours in order for your provider to thoroughly review all the results before contacting the office for clarification of your results. If everything is normal, you will get a letter in the mail or a message in My Chart. Please give [place] a call if you do not hear from [place] after 2 weeks.  Please bring all of your medications with you to each visit.    If you haven't already, sign up for My Chart to have easy access to your labs results, and communication with your primary care physician.  Feel free to call with any questions or concerns at any time, at [phone].   Take care,  Dr. Rushie [facility]

## 2021-03-10 NOTE — Progress Notes (Signed)
   SUBJECTIVE:   CHIEF COMPLAINT / HPI:   Chief Complaint  Patient presents with  . Nausea     Allison Leonard is a 45 y.o. female here for nausea.   Pt reports last week she vomited for 3 days. Has non-bloody, non-bilious vomiting on average 2-3 times a day. Feels like she pulled a muscle in her stomach after vomiting which is unchanged from baseline. Taking prochlormazine relief.  Reports better relief when she was taking Phenergan.  Has history of cyclic vomiting syndrome.  Follows with GI.  Cyclic vomitingpreviously thought to be due to [facility] use but she stopped and it did not help. Last used in marijuana in February 2022. Denies fever, congestion, sore throat, myaslagis.    PERTINENT  PMH / PSH: reviewed and updated as appropriate   OBJECTIVE:   BP 98/60   Pulse (!) 112   Ht 5' 4"  (1.626 m)   Wt 109 lb (49.4 kg)   SpO2 96%   BMI 18.71 kg/m    GEN: thin pleasant female, in no acute distress  CV: Tachycardic with regular rhythm, no murmurs appreciated  RESP: no increased work of breathing, clear to ascultation bilaterally with no crackles, wheezes, or rhonchi  ABD: Bowel sounds present.  Soft, mild epigastric tenderness, MSK: thin extremities, normal ROM  SKIN: warm, dry PSYCH: Normal affect, appropriate speech and behavior    ASSESSMENT/PLAN:   Cyclic vomiting syndrome Stable.  Has 2-3 episodes of emesis at baseline.  Remains intermittently tachycardic. Switch from Compazine to Phenergan.  Obtain CMP and CBC. No history of pancreatitis.  Advised to follow-up with GI.  Consider adding back low-dose Ativan 0.5 mg three times daily if Phenergan does not resolve nausea.  Avoid THC use.  ED precautions given.   Obtain hepatitis C screening.  Lyndee Hensen, DO PGY-2, [facility] 03/10/2021

## 2021-03-11 LAB — CBC
Hematocrit: 41.7 % (ref 34.0–46.6)
Hemoglobin: 13.6 g/dL (ref 11.1–15.9)
MCH: 27.4 pg (ref 26.6–33.0)
MCHC: 32.6 g/dL (ref 31.5–35.7)
MCV: 84 fL (ref 79–97)
Platelets: 222 10*3/uL (ref 150–450)
RBC: 4.96 x10E6/uL (ref 3.77–5.28)
RDW: 12.6 % (ref 11.7–15.4)
WBC: 9 10*3/uL (ref 3.4–10.8)

## 2021-03-11 LAB — COMPREHENSIVE METABOLIC PANEL
ALT: 12 IU/L (ref 0–32)
AST: 17 IU/L (ref 0–40)
Albumin/Globulin Ratio: 1.8 (ref 1.2–2.2)
Albumin: 5 g/dL — ABNORMAL HIGH (ref 3.8–4.8)
Alkaline Phosphatase: 78 IU/L (ref 44–121)
BUN/Creatinine Ratio: 18 (ref 9–23)
BUN: 12 mg/dL (ref 6–24)
Bilirubin Total: 0.2 mg/dL (ref 0.0–1.2)
CO2: 26 mmol/L (ref 20–29)
Calcium: 10.1 mg/dL (ref 8.7–10.2)
Chloride: 100 mmol/L (ref 96–106)
Creatinine, Ser: 0.68 mg/dL (ref 0.57–1.00)
Globulin, Total: 2.8 g/dL (ref 1.5–4.5)
Glucose: 148 mg/dL — ABNORMAL HIGH (ref 65–99)
Potassium: 4.8 mmol/L (ref 3.5–5.2)
Sodium: 142 mmol/L (ref 134–144)
Total Protein: 7.8 g/dL (ref 6.0–8.5)
eGFR: 110 mL/min/{1.73_m2} (ref >59)

## 2021-03-11 LAB — HEPATITIS C ANTIBODY: Hep C Virus Ab: <0.1 s/co ratio (ref 0.0–0.9)

## 2021-03-11 NOTE — Assessment & Plan Note (Addendum)
Stable.  Has 2-3 episodes of emesis at baseline.  Remains intermittently tachycardic. Switch from Compazine to Phenergan.  Obtain CMP and CBC. No history of pancreatitis.  Advised to follow-up with GI.  Consider adding back low-dose Ativan 0.5 mg three times daily if Phenergan does not resolve nausea.  Avoid THC use.  ED precautions given.

## 2021-03-28 ENCOUNTER — Other Ambulatory Visit: Payer: Self-pay | Admitting: Gastroenterology

## 2021-03-28 ENCOUNTER — Other Ambulatory Visit: Payer: Self-pay | Admitting: Family Medicine

## 2021-03-28 DIAGNOSIS — R1115 Cyclical vomiting syndrome unrelated to migraine: Secondary | ICD-10-CM

## 2021-04-22 ENCOUNTER — Other Ambulatory Visit: Payer: Self-pay | Admitting: Neurology

## 2021-04-26 ENCOUNTER — Other Ambulatory Visit: Payer: Self-pay | Admitting: Family Medicine

## 2021-04-26 DIAGNOSIS — R1115 Cyclical vomiting syndrome unrelated to migraine: Secondary | ICD-10-CM

## 2021-05-20 ENCOUNTER — Other Ambulatory Visit: Payer: Self-pay | Admitting: Family Medicine

## 2021-05-20 ENCOUNTER — Other Ambulatory Visit: Payer: Self-pay | Admitting: Gastroenterology

## 2021-05-20 DIAGNOSIS — R1115 Cyclical vomiting syndrome unrelated to migraine: Secondary | ICD-10-CM

## 2021-06-06 ENCOUNTER — Telehealth: Payer: Self-pay | Admitting: Gastroenterology

## 2021-06-06 ENCOUNTER — Other Ambulatory Visit: Payer: Self-pay

## 2021-06-06 DIAGNOSIS — R1013 Epigastric pain: Secondary | ICD-10-CM

## 2021-06-06 MED ORDER — DICYCLOMINE HCL 20 MG PO TABS: 20.0000 mg | ORAL_TABLET | Freq: Three times a day (TID) | ORAL | 0 refills | Status: DC | PRN

## 2021-06-06 NOTE — Telephone Encounter (Signed)
Pt calling requesting refill for Bentyl. Pt has scheduled appt for 09/06 at 1:50pm..   Plz advise  thanks

## 2021-06-06 NOTE — Telephone Encounter (Signed)
Outpatient Medication Detail   Disp Refills Start End   dicyclomine (BENTYL) 20 MG tablet 30 tablet 0 06/06/2021    Sig - Route: Take 1 tablet (20 mg total) by mouth 3 (three) times daily as needed for spasms. - Oral   Sent to pharmacy as: dicyclomine (BENTYL) 20 MG tablet   Notes to Pharmacy: MUST KEEP SCHEDULED APPT   E-Prescribing Status: Receipt confirmed by pharmacy (06/06/2021  9:04 AM EDT)    According to orders, pt is to be taking PRN. If taking routinely, appt is required to further eval increased need. Only 30 tabs refilled.

## 2021-06-23 ENCOUNTER — Ambulatory Visit: Payer: Medicaid Other | Admitting: Gastroenterology

## 2021-06-26 NOTE — Progress Notes (Signed)
NEUROLOGY FOLLOW UP OFFICE NOTE  Allison Leonard [id number]  Assessment/Plan:   Chronic migraine without aura, without status migrainosus, not intractable Cerebral aneurysms (distal cavernous/paraclinoid right ICA and basilar artery) Chronic nonocculsive superior venous thrombosis - hypercoagulable panel negative Cyclic vomiting syndrome  Will start nortriptyline 47m at bedtime.  It may help with cyclic vomiting syndrome as well.  If no improvement in 8 weeks, would increase to 519mat bedtime Will have her try Nurtec for rescue medication Regarding the cerebral aneurysms, refer again to Dr. DeEstanislado Pandyf interventional radiology.  Will have a repeat CTA of head performed prior to consultation.  If she is not contacted about making an appointment over the next week, she will contact me Limit use of pain relievers to no more than 2 days out of week to prevent risk of rebound or medication-overuse headache. Keep headache diary Follow up 6 months.   Subjective:  Allison Leonard a [age]ear old female with Celiac disease and Cyclic Vomiting Syndrome who follows up for migraines.   UPDATE: Last seen in June 2021.  At that time, topiramatewas increased.  MRI brain with and without contrast on 06/03/2020 personally reviewed showed no acute intracranial abnormality but did show chronic nonocclusive thrombus in the superior sagittal sinus; 1-3 mm outpouchings involving the right cavernous ICA, left ICA terminus, proximal basilar artery and right P1 segment; and mild to moderate bilateral A1 segment narrowing.  Follow up CTA of head on 06/20/2020 personally reviewed redemonstrated 1-2 mm distal cavernous/paraclinoid right ICA aneurysm and 2 mm proximal basilar artery aneurysm but previously seen small aneurysms of the right P1 PCA and left ICA terminus not appreciated.  She was referred to interventional radiology at that time but she never received a call.  Hypercoagulable panel  including Factor V Leiden gene mutation, from June and August 2021 was negative.  She tried UbIranwhich was ineffective.  She discontinued topiramate because it was ineffective.  She has a persistent dull nagging headache.  Takes Tylenol extended release every other day. Migraines occur 2-3 times a week, severe, lasting all day.  Usually wakes up with the migraine.    Frequency:  13 headaches in last 30 days Current NSAIDS:  none Current analgesics:  none Current triptans:  contraindicated - cerebral aneurysms, history of cerebral venous thrombosis Current ergotamine:  none Current anti-emetic:  none Current muscle relaxants:  none Current sleep aide:  none Current Antihypertensive medications:  none Current Antidepressant medications:  none Current Anticonvulsant medications: none Current anti-CGRP:  none Current Vitamins/Herbal/Supplements:  none Current Antihistamines/Decongestants:  none Other therapy:  none Hormone/birth control:  none   Caffeine:  Coffee 1 to 2 times a month.  No soda Exercise:  no Depression:  no; Anxiety:  no Other pain:  Sometimes abdominal discomfort Sleep hygiene:  Restless   HISTORY:  Started having headaches at age 45 Worse over the past 3 to 4 years.  They are mostly severe sharp or pounding headache in her right temple.  Usually lasts a day.  Sometimes they wake her up at night.  17 days over last 30 days.  Nausea, vomiting, photophobia, phonophobia, blurred vision.  No numbness or weakness.  No specific triggers.  Vomiting may help make it better.     Past NSAIDS:  ibuprofen Past analgesics:  Fioricet, Excedrin Past abortive triptans:  sumatriptan 10026mast abortive ergotamine:  none Past muscle relaxants:  none Past anti-emetic:  Compazine, Reglan 21m1mromethazine 12.5mg 32mt antihypertensive medications:  none Past antidepressant medications:  none Past anticonvulsant medications:  topiramate Past anti-CGRP:  Roselyn Meier Past  vitamins/Herbal/Supplements:  none Past antihistamines/decongestants:  Benadryl Other past therapies:  none   Family history of headache:  Brother, sister, mother, daughter  PAST MEDICAL HISTORY: Past Medical History:  Diagnosis Date   Acid reflux 07/04/2018   Anemia    Celiac disease/sprue    Cyclic vomiting syndrome    Generalized anxiety disorder 07/04/2018   History of colon polyps    last colonoscopy 2018 removed multiple benign polyps   Migraines 07/04/2018    MEDICATIONS: Current Outpatient Medications on File Prior to Visit  Medication Sig Dispense Refill   dicyclomine (BENTYL) 20 MG tablet Take 1 tablet (20 mg total) by mouth 3 (three) times daily as needed for spasms. 30 tablet 0   famotidine (PEPCID) 20 MG tablet TAKE 1 TABLET BY MOUTH TWICE A DAY 60 tablet 2   Hyoscyamine Sulfate SL (LEVSIN/SL) 0.125 MG SUBL Place 1 each under the tongue 4 (four) times daily as needed for up to 5 days. 30 tablet 0   lidocaine (XYLOCAINE) 2 % solution Use as directed 15 mLs in the mouth or throat every 6 (six) hours as needed for mouth pain. (Patient not taking: Reported on 04/15/2020) 100 mL 0   LORazepam (ATIVAN) 0.5 MG tablet Take 2 tablets (1 mg total) by mouth 3 (three) times daily as needed (nausea and vomiting). (Patient not taking: Reported on 04/15/2020) 30 tablet 0   metoCLOPramide (REGLAN) 10 MG tablet Take 1 tablet (10 mg total) by mouth every 6 (six) hours. 15 tablet 0   pantoprazole (PROTONIX) 20 MG tablet TAKE 1 TABLET BY MOUTH EVERY DAY 90 tablet 1   prochlorperazine (COMPAZINE) 10 MG tablet TAKE 1 TABLET BY MOUTH EVERY 8 HOURS AS NEEDED FOR NAUSEA AND VOMITING 90 tablet 0   promethazine (PHENERGAN) 25 MG tablet TAKE 1 TABLET BY MOUTH EVERY 6 HOURS AS NEEDED FOR NAUSEA OR VOMITING. 12 tablet 0   SUMAtriptan (IMITREX) 20 MG/ACT nasal spray Place 1 spray into nose at earliest onset of migraine.  May repeat in 2 hours if headache persists or recurs.  Maximum 2 sprays in 24 hours 1  Inhaler 5   topiramate (TOPAMAX) 100 MG tablet TAKE 1 TABLET BY MOUTH EVERYDAY AT BEDTIME 90 tablet 0   Ubrogepant (UBRELVY) 100 MG TABS Take 1 tablet by mouth as needed (May repeat 1 tablet in 2 hours.  Maximum 2 tablets in 24 hours.). 10 tablet 5   Current Facility-Administered Medications on File Prior to Visit  Medication Dose Route Frequency Provider Last Rate Last Admin   0.9 %  sodium chloride infusion  500 mL Intravenous Once Thornton Park, MD        ALLERGIES: Allergies  Allergen Reactions   Zofran [Ondansetron Hcl] Anaphylaxis   Morphine And Related Itching    FAMILY HISTORY: Family History  Problem Relation Age of Onset   Diabetes Father    Hypertension Father    Hyperlipidemia Father    Colon polyps Father    Heart attack Maternal Grandmother    Heart attack Paternal Grandmother    Cervical cancer Maternal Aunt    Stomach cancer Maternal Aunt    Throat cancer Maternal Uncle    Stroke Maternal Uncle    Down syndrome Son    Colon cancer Sister    Esophageal cancer Neg Hx    Rectal cancer Neg Hx       Objective:  Blood pressure  119/83, pulse (!) 102, height 5' 2"  (1.575 m), weight 108 lb 3.2 oz (49.1 kg), SpO2 98 %. General: No acute distress.  Patient appears well-groomed.   Head:  Normocephalic/atraumatic Eyes:  Fundi examined but not visualized Neck: supple, no paraspinal tenderness, full range of motion Heart:  Regular rate and rhythm Lungs:  Clear to auscultation bilaterally Back: No paraspinal tenderness Neurological Exam: alert and oriented to person, place, and time.  Speech fluent and not dysarthric, language intact.  CN II-XII intact. Bulk and tone normal, muscle strength 5/5 throughout.  Sensation to light touch intact.  Deep tendon reflexes 2+ throughout, toes downgoing.  Finger to nose testing intact.  Gait normal, Romberg negative.   Metta Clines, DO  CC: Lyndee Hensen, DO

## 2021-06-27 ENCOUNTER — Encounter: Payer: Self-pay | Admitting: Neurology

## 2021-06-27 ENCOUNTER — Other Ambulatory Visit: Payer: Self-pay

## 2021-06-27 ENCOUNTER — Ambulatory Visit: Payer: Medicaid Other | Admitting: Neurology

## 2021-06-27 VITALS — BP 119/83 | HR 102 | Ht 62.0 in | Wt 108.2 lb

## 2021-06-27 DIAGNOSIS — G43719 Chronic migraine without aura, intractable, without status migrainosus: Secondary | ICD-10-CM | POA: Diagnosis not present

## 2021-06-27 DIAGNOSIS — G08 Intracranial and intraspinal phlebitis and thrombophlebitis: Secondary | ICD-10-CM

## 2021-06-27 DIAGNOSIS — R1115 Cyclical vomiting syndrome unrelated to migraine: Secondary | ICD-10-CM | POA: Diagnosis not present

## 2021-06-27 DIAGNOSIS — I671 Cerebral aneurysm, nonruptured: Secondary | ICD-10-CM | POA: Diagnosis not present

## 2021-06-27 MED ORDER — NURTEC 75 MG PO TBDP: ORAL_TABLET | ORAL | 0 refills | Status: DC

## 2021-06-27 MED ORDER — NORTRIPTYLINE HCL 25 MG PO CAPS: 25.0000 mg | ORAL_CAPSULE | Freq: Every day | ORAL | 5 refills | Status: DC

## 2021-06-27 NOTE — Patient Instructions (Signed)
Start nortriptyline 58m at bedtime.  If no improvement in headache frequency in 6 weeks, contact me and we can increase dose Try taking Nurtec as soon as you have a migraine (or if you first wake up with migraine).  Maximum 1 tablet in 24 hours.  If effective, contact me for prescription Limit use of pain relievers (Tylenol) to no more than 2 days out of week to prevent risk of rebound or medication-overuse headache. Check CTA of head.  I want you seen by Dr. DEstanislado Pandyafter CTA performed Follow up 6 months.

## 2021-07-03 ENCOUNTER — Other Ambulatory Visit: Payer: Self-pay | Admitting: Family Medicine

## 2021-07-03 ENCOUNTER — Other Ambulatory Visit: Payer: Self-pay | Admitting: Gastroenterology

## 2021-07-03 DIAGNOSIS — R1115 Cyclical vomiting syndrome unrelated to migraine: Secondary | ICD-10-CM

## 2021-07-03 DIAGNOSIS — R1013 Epigastric pain: Secondary | ICD-10-CM

## 2021-07-04 NOTE — Telephone Encounter (Signed)
Pt requires appt for refills.

## 2021-07-05 ENCOUNTER — Ambulatory Visit (INDEPENDENT_AMBULATORY_CARE_PROVIDER_SITE_OTHER): Payer: Medicaid Other | Admitting: Gastroenterology

## 2021-07-05 ENCOUNTER — Encounter: Payer: Self-pay | Admitting: Gastroenterology

## 2021-07-05 VITALS — BP 118/62 | Ht 62.0 in | Wt 106.0 lb

## 2021-07-05 DIAGNOSIS — R1013 Epigastric pain: Secondary | ICD-10-CM

## 2021-07-05 DIAGNOSIS — R112 Nausea with vomiting, unspecified: Secondary | ICD-10-CM

## 2021-07-05 DIAGNOSIS — K5909 Other constipation: Secondary | ICD-10-CM

## 2021-07-05 MED ORDER — TRULANCE 3 MG PO TABS: 3.0000 mg | ORAL_TABLET | Freq: Every day | ORAL | 2 refills | Status: DC

## 2021-07-05 MED ORDER — FAMOTIDINE 20 MG PO TABS: 20.0000 mg | ORAL_TABLET | Freq: Two times a day (BID) | ORAL | 3 refills | Status: DC

## 2021-07-05 MED ORDER — DICYCLOMINE HCL 20 MG PO TABS: 20.0000 mg | ORAL_TABLET | Freq: Three times a day (TID) | ORAL | 0 refills | Status: DC | PRN

## 2021-07-05 MED ORDER — METOCLOPRAMIDE HCL 10 MG PO TABS: 10.0000 mg | ORAL_TABLET | Freq: Four times a day (QID) | ORAL | 0 refills | Status: DC | PRN

## 2021-07-05 NOTE — Progress Notes (Signed)
Referring Provider: Lyndee Hensen, DO Primary Care Physician:  Lyndee Hensen, DO  Chief complaint:  Nausea, vomiting, and constipation   IMPRESSION:  Progressive constipation Nausea and vomiting Heartburn History of colon polyps    -5 tubular adenomas removed in 2015    -1 hyperplastic polyp removed in 2018    - 2 tubular adenomas 2021 (42m cecal, 277mdescending colon) Family history of colon cancer (sister at age 45   - surveillance colonoscopy 2026  Recommend trial of Motegrity to treat diffuse GI motility disorder. Unfortunately, insurance will not cover. Provided samples of Tulance today. Hopefully improving constipation will improve her other symptoms.   PLAN: - Continue Reglan, Pepcid, and Bentyl - Trulance 57m27maily (samples provided) - Follow-up in 1 month, earlier if needed  HPI: Allison Leonard a [age]o. female who was initially seen in 2020 and 2021 for nausea, vomiting, and upper abdominal pain.  This is my first office visit with Allison Leonard.  She has anxiety, migraine headaches, cyclic vomiting with cannabinoid hyperemesis syndrome, and a history of colon polyps.  Has had a prior hysterectomy and appendectomy.  Evaluation for nausea, vomiting, and diarrhea included an abdominal ultrasound, incomplete gastric emptying scan, normal CT angiogram of the abdomen and pelvis.  Now having constipation. It has been 9 days since her last BM. Normally having 3 BM weekly. Fleet suppositories, Miralax QD-BID x 7 days, OTC laxative. Associated straining. Sense of incomplete evacuation. No response to Linzess in the past.  No blood or mucous in the stool   Prior endoscopic evaluation: - EGD 2015: Normal.  Duodenal biopsies negative for celiac disease - Colonoscopy 2015 in [place] tubular adenomas - Colonoscopy 2018: Sessile hyperplastic polyp sigmoid, surveillance colonoscopy recommended in 2021 - EGD 04/15/2020: Chronic gastritis without H. pylori,  normal duodenal biopsies, reflux, no eosinophilic esophagitis - Colonoscopy 04/15/2020.  Cecal tubular adenoma descending: >40m48mbular adenoma call the office after the procedure reporting constipation.  Was instructed to take MiraLAX.  Sister with stage IV colon cancer diagnosed at the age of 46. 94ast Medical History:  Diagnosis Date   Acid reflux 07/04/2018   Anemia    Celiac disease/sprue    Cyclic vomiting syndrome    Generalized anxiety disorder 07/04/2018   History of colon polyps    last colonoscopy 2018 removed multiple benign polyps   Migraines 07/04/2018    Past Surgical History:  Procedure Laterality Date   ABDOMINAL HYSTERECTOMY  2006   APPENDECTOMY  1999   COLONOSCOPY  2018   ESOPHAGOGASTRODUODENOSCOPY  2018    Prior to Admission medications   Medication Sig Start Date End Date Taking? Authorizing Provider  dicyclomine (BENTYL) 20 MG tablet Take 1 tablet (20 mg total) by mouth 3 (three) times daily as needed for spasms. 06/06/21   BeavThornton Park  famotidine (PEPCID) 20 MG tablet TAKE 1 TABLET BY MOUTH TWICE A DAY 01/11/21   BeavThornton Park  Hyoscyamine Sulfate SL (LEVSIN/SL) 0.125 MG SUBL Place 1 each under the tongue 4 (four) times daily as needed for up to 5 days. 02/12/20 02/17/20  CardFatima Blank  metoCLOPramide (REGLAN) 10 MG tablet Take 1 tablet (10 mg total) by mouth every 6 (six) hours. 06/21/20   BrimLyndee Hensen  nortriptyline (PAMELOR) 25 MG capsule Take 1 capsule (25 mg total) by mouth at bedtime. 06/27/21   JaffPieter Partridge  prochlorperazine (COMPAZINE) 10 MG tablet TAKE 1 TABLET BY MOUTH EVERY 8 HOURS AS  NEEDED FOR NAUSEA AND VOMITING 07/04/21   Lyndee Hensen, DO  Rimegepant Sulfate (NURTEC) 75 MG TBDP Samples of this drug were given to the patient, quantity 2, Lot Number [id number] exp 07/2023 06/27/21   Pieter Partridge, DO  SUMAtriptan (IMITREX) 20 MG/ACT nasal spray Place 1 spray into nose at earliest onset of migraine.  May repeat in 2  hours if headache persists or recurs.  Maximum 2 sprays in 24 hours 04/29/20   Pieter Partridge, DO    Current Outpatient Medications  Medication Sig Dispense Refill   nortriptyline (PAMELOR) 25 MG capsule Take 1 capsule (25 mg total) by mouth at bedtime. 30 capsule 5   Plecanatide (TRULANCE) 3 MG TABS Take 3 mg by mouth daily. 30 tablet 2   prochlorperazine (COMPAZINE) 10 MG tablet TAKE 1 TABLET BY MOUTH EVERY 8 HOURS AS NEEDED FOR NAUSEA AND VOMITING 90 tablet 0   Rimegepant Sulfate (NURTEC) 75 MG TBDP Samples of this drug were given to the patient, quantity 2, Lot Number [id number] exp 07/2023 30 tablet 0   dicyclomine (BENTYL) 20 MG tablet Take 1 tablet (20 mg total) by mouth 3 (three) times daily as needed for spasms. 30 tablet 0   famotidine (PEPCID) 20 MG tablet Take 1 tablet (20 mg total) by mouth 2 (two) times daily. 180 tablet 3   metoCLOPramide (REGLAN) 10 MG tablet Take 1 tablet (10 mg total) by mouth every 6 (six) hours as needed for nausea. 15 tablet 0   Current Facility-Administered Medications  Medication Dose Route Frequency Provider Last Rate Last Admin   0.9 %  sodium chloride infusion  500 mL Intravenous Once Thornton Park, MD        Allergies as of 07/05/2021 - Review Complete 07/05/2021  Allergen Reaction Noted   Zofran Alvis Lemmings hcl] Anaphylaxis 10/17/2018   Morphine and related Itching 06/23/2018    Family History  Problem Relation Age of Onset   Diabetes Father    Hypertension Father    Hyperlipidemia Father    Colon polyps Father    Heart attack Maternal Grandmother    Heart attack Paternal Grandmother    Cervical cancer Maternal Aunt    Stomach cancer Maternal Aunt    Throat cancer Maternal Uncle    Stroke Maternal Uncle    Down syndrome Son    Colon cancer Sister    Esophageal cancer Neg Hx    Rectal cancer Neg Hx     Physical Exam: General:   Alert,  well-nourished, pleasant and cooperative in NAD Head:  Normocephalic and atraumatic. Eyes:   Sclera clear, no icterus.   Conjunctiva pink. Abdomen:  Soft, nontender, nondistended, normal bowel sounds, no rebound or guarding. No hepatosplenomegaly.   Rectal:  Deferred  Msk:  Symmetrical. No boney deformities LAD: No inguinal or umbilical LAD Extremities:  No clubbing or edema. Neurologic:  Alert and  oriented x4;  grossly nonfocal Skin:  Intact without significant lesions or rashes. Psych:  Alert and cooperative. Normal mood and affect.    Rolando Hessling L. Tarri Glenn, MD, MPH 07/06/2021, 1:28 PM

## 2021-07-05 NOTE — Patient Instructions (Addendum)
It was my pleasure to provide care to you today. Based on our discussion, I am providing you with my recommendations below:  RECOMMENDATION(S):   PRESCRIPTION MEDICATION(S):   We have sent the following medication(s) to your pharmacy:  Reglan Trulance Pepcid Bentyl  NOTE: If your medication(s) requires a PRIOR AUTHORIZATION, we will receive notification from your pharmacy. Once received, the process to submit for approval may take up to 7-10 business days. You will be contacted about any denials we have received from your insurance company as well as alternatives recommended by your provider.  FOLLOW UP:  I would like for you to follow up with me in 4-6 weeks. Please refer to your appointments included within this AVS.  BMI:  If you are age 50 or younger, your body mass index should be between 19-25. Your Body mass index is 19.39 kg/m. If this is out of the aformentioned range listed, please consider follow up with your Primary Care Provider.   MY CHART:  The [place] [facility] providers would like to encourage you to use [facility] to communicate with providers for non-urgent requests or questions.  Due to long hold times on the telephone, sending your provider a message by [facility] may be a faster and more efficient way to get a response.  Please allow 48 business hours for a response.  Please remember that this is for non-urgent requests.   Thank you for trusting me with your gastrointestinal care!    Thornton Park, MD, MPH

## 2021-07-06 ENCOUNTER — Encounter: Payer: Self-pay | Admitting: Gastroenterology

## 2021-07-06 ENCOUNTER — Telehealth: Payer: Self-pay

## 2021-07-06 NOTE — Telephone Encounter (Signed)
APPROVAL  Medication: [place]: [employer] [facility] [place] response: Approved Approval dates: 07/06/21 through 07/06/22 Misc. Notes: Allison Leonard Key: [id number] - PA Case ID: [id number] - Rx #: [id number] Need help? Call us at [phone] Outcome Approvedtoday PA Case: [id number], Status: Approved, Coverage Starts on: 07/06/2021 12:00:00 AM, Coverage Ends on: 07/06/2022 12:00:00 AM. Drug Trulance 3MG tablets Form [employer] [facility] Electronic [place] Form [phone] NCPDP) [place] [phone]

## 2021-07-06 NOTE — Telephone Encounter (Signed)
PRIOR AUTHORIZATION  PA initiation date: 07/06/21  Medication: [place]: [employer] [facility] [employer] Submission completed electronically through [employer] My Meds: Yes  Will await insurance response re: approval/denial.  Allison Leonard (Key: [id number])  Your information has been sent to [place] [employer].

## 2021-07-11 ENCOUNTER — Ambulatory Visit: Payer: Medicaid Other | Admitting: Gastroenterology

## 2021-07-19 ENCOUNTER — Other Ambulatory Visit: Payer: Medicaid Other

## 2021-07-20 ENCOUNTER — Ambulatory Visit: Payer: Medicaid Other

## 2021-07-20 NOTE — Progress Notes (Deleted)
    SUBJECTIVE:   CHIEF COMPLAINT / HPI:   Lump Under Arm   PERTINENT  PMH / PSH:  Past Medical History:  Diagnosis Date   Acid reflux 07/04/2018   Anemia    Celiac disease/sprue    Cyclic vomiting syndrome    Generalized anxiety disorder 07/04/2018   History of colon polyps    last colonoscopy 2018 removed multiple benign polyps   Migraines 07/04/2018     OBJECTIVE:   There were no vitals taken for this visit. ***  General: NAD, pleasant, able to participate in exam Cardiac: RRR, no murmurs. Respiratory: CTAB, normal effort, No wheezes, rales or rhonchi Abdomen: Bowel sounds present, nontender, nondistended, no hepatosplenomegaly. Extremities: no edema or cyanosis. Skin: warm and dry, no rashes noted Neuro: alert, no obvious focal deficits Psych: Normal affect and mood  ASSESSMENT/PLAN:   No problem-specific Assessment & Plan notes found for this encounter.     Sharion Settler, [place]

## 2021-07-27 ENCOUNTER — Other Ambulatory Visit: Payer: Self-pay | Admitting: Gastroenterology

## 2021-07-27 ENCOUNTER — Other Ambulatory Visit: Payer: Self-pay | Admitting: Neurology

## 2021-07-27 ENCOUNTER — Other Ambulatory Visit: Payer: Self-pay | Admitting: Family Medicine

## 2021-07-27 DIAGNOSIS — R1115 Cyclical vomiting syndrome unrelated to migraine: Secondary | ICD-10-CM

## 2021-07-27 DIAGNOSIS — R1013 Epigastric pain: Secondary | ICD-10-CM

## 2021-08-02 ENCOUNTER — Ambulatory Visit
Admission: RE | Admit: 2021-08-02 | Discharge: 2021-08-02 | Disposition: A | Discharge status: Home / Self Care | Payer: Medicaid Other | Source: Ambulatory Visit | Attending: Neurology | Admitting: Neurology

## 2021-08-02 ENCOUNTER — Other Ambulatory Visit: Payer: Self-pay

## 2021-08-02 DIAGNOSIS — G43709 Chronic migraine without aura, not intractable, without status migrainosus: Secondary | ICD-10-CM | POA: Diagnosis not present

## 2021-08-02 DIAGNOSIS — I671 Cerebral aneurysm, nonruptured: Secondary | ICD-10-CM

## 2021-08-02 IMAGING — CT CT ANGIO HEAD
2 of 4 series · 6 of 30 positions shown · IV contrast (iopamidol)
Comparison: [DATE]

CLINICAL DATA: Cerebral aneurysm.  Chronic migraines.

EXAM:
CT ANGIOGRAPHY HEAD
TECHNIQUE: Multidetector CT imaging of the head was performed using the
standard protocol during bolus administration of intravenous
contrast. Multiplanar CT image reconstructions and MIPs were
obtained to evaluate the vascular anatomy.
CONTRAST:  75mL [XT] IOPAMIDOL ([XT]) INJECTION 76%

[Series 4: head w/(date) · axial · 0.43mm/px · z∈[-158,-93]mm · 2 of 39 slices shown]
[im 13/39  brain]
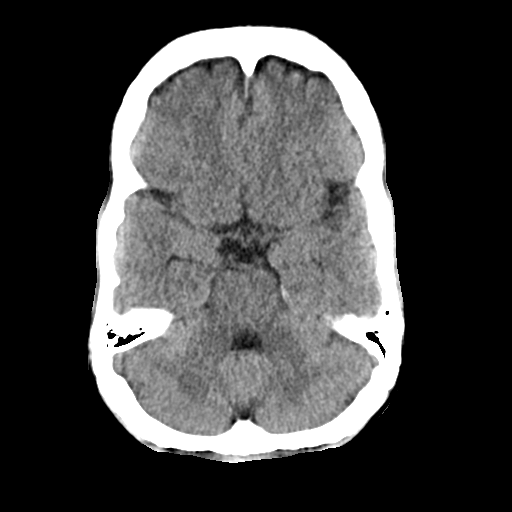
[im 26/39  brain]
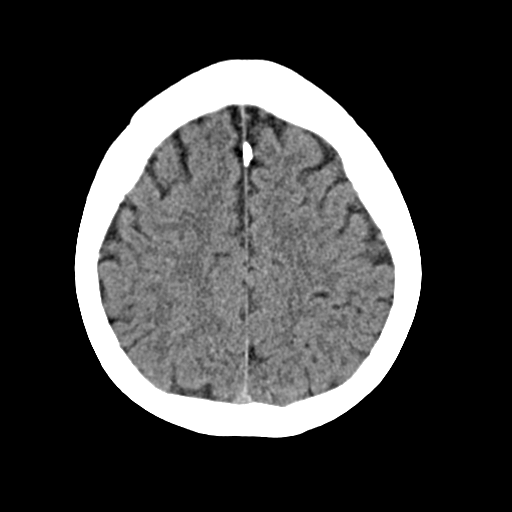

[Series 7: head angio · axial · 0.43mm/px · z∈[-182,-68]mm · 4 of 64 slices shown]
[im 13/64  brain]
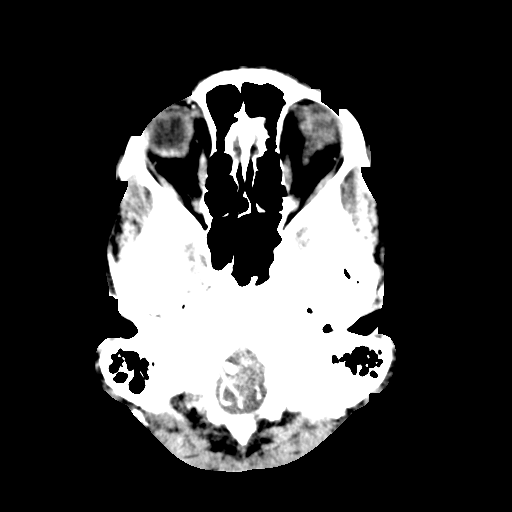
[im 26/64  bone]
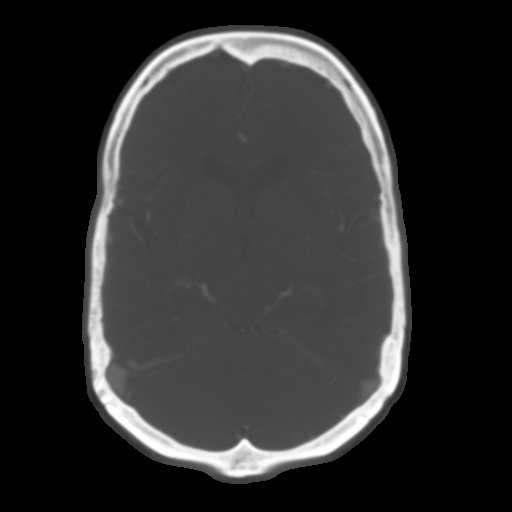
[im 38/64  brain]
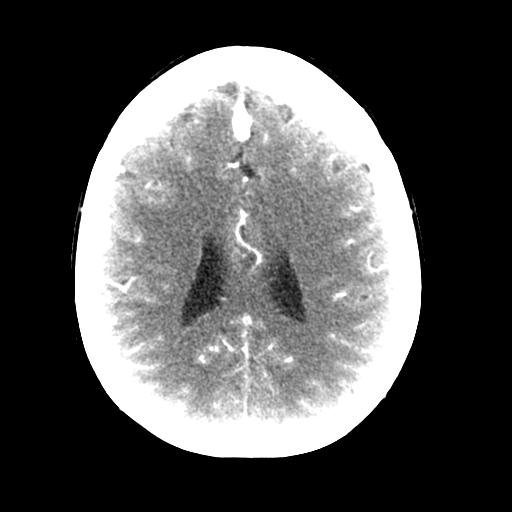
[im 51/64  bone]
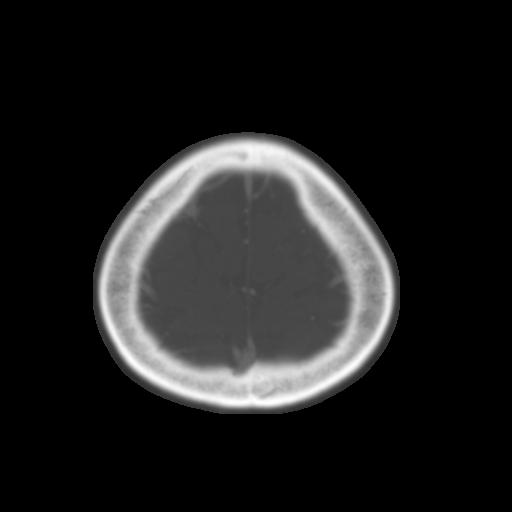

[6 of 30 positions shown; findings below may reference images not displayed]

FINDINGS: CT HEAD

Brain: There is no evidence of an acute infarct, intracranial
hemorrhage, mass, midline shift, or extra-axial fluid collection.
The ventricles and sulci are within normal limits.

Vascular: No hyperdense vessel.

Skull: No fracture or suspicious osseous lesion.

Sinuses: Visualized paranasal sinuses and mastoid air cells are
clear.

Other: Unremarkable.

CTA HEAD

Anterior circulation: The internal carotid arteries are widely
patent from skull base to carotid termini. A 1-2 mm inferiorly
directed protrusion from the right paraclinoid ICA is unchanged
(series 14, image 88). ACAs and MCAs are patent without evidence of
a proximal branch occlusion or significant proximal stenosis.

Posterior circulation: The intracranial vertebral arteries are
patent to the basilar with the right being strongly dominant and the
left being diminutive distal to the PICA origin. The basilar artery
is widely patent. Abnormal contour of the proximal basilar artery is
unchanged and could reflect a 2 mm aneurysm or fenestration. There
patent posterior communicating arteries bilaterally. Both PCAs are
patent without evidence of a significant proximal stenosis.

Venous sinuses: Patent.

Anatomic variants: None.

Review of the MIP images confirms the above findings.
IMPRESSION: 1. Unchanged 1-2 mm right paraclinoid ICA aneurysm versus
infundibulum.
2. Unchanged 2 mm aneurysm or fenestration of the proximal basilar
artery.
3. Unremarkable noncontrast CT appearance of the brain.

## 2021-08-02 MED ORDER — IOPAMIDOL (ISOVUE-370) INJECTION 76%
75.0000 mL | Freq: Once | INTRAVENOUS | Status: AC | PRN
  Administered 2021-08-02: 75 mL via INTRAVENOUS

## 2021-08-08 ENCOUNTER — Other Ambulatory Visit (HOSPITAL_COMMUNITY): Payer: Self-pay | Admitting: Interventional Radiology

## 2021-08-08 ENCOUNTER — Other Ambulatory Visit: Payer: Self-pay

## 2021-08-08 DIAGNOSIS — I671 Cerebral aneurysm, nonruptured: Secondary | ICD-10-CM

## 2021-08-11 ENCOUNTER — Other Ambulatory Visit: Payer: Self-pay

## 2021-08-11 ENCOUNTER — Ambulatory Visit (HOSPITAL_COMMUNITY)
Admission: RE | Admit: 2021-08-11 | Discharge: 2021-08-11 | Disposition: A | Discharge status: Home / Self Care | Payer: Medicaid Other | Source: Ambulatory Visit | Attending: Interventional Radiology | Admitting: Interventional Radiology

## 2021-08-11 ENCOUNTER — Ambulatory Visit: Payer: Medicaid Other | Admitting: Gastroenterology

## 2021-08-11 DIAGNOSIS — R519 Headache, unspecified: Secondary | ICD-10-CM | POA: Diagnosis not present

## 2021-08-11 DIAGNOSIS — I671 Cerebral aneurysm, nonruptured: Secondary | ICD-10-CM

## 2021-08-17 ENCOUNTER — Telehealth: Payer: Self-pay | Admitting: Neurology

## 2021-08-17 NOTE — Telephone Encounter (Signed)
She went to radiology appt- needs a procedure with stint, has not heard from anyone.

## 2021-08-17 NOTE — Telephone Encounter (Signed)
PT spoke to [facility] and her appt is scheduled.

## 2021-08-21 ENCOUNTER — Other Ambulatory Visit (HOSPITAL_COMMUNITY): Payer: Self-pay | Admitting: Interventional Radiology

## 2021-08-21 DIAGNOSIS — I671 Cerebral aneurysm, nonruptured: Secondary | ICD-10-CM

## 2021-08-21 HISTORY — PX: IR RADIOLOGIST EVAL & MGMT: IMG5224

## 2021-08-25 ENCOUNTER — Other Ambulatory Visit: Payer: Self-pay | Admitting: Gastroenterology

## 2021-08-25 ENCOUNTER — Other Ambulatory Visit: Payer: Self-pay | Admitting: Family Medicine

## 2021-08-25 DIAGNOSIS — R1013 Epigastric pain: Secondary | ICD-10-CM

## 2021-08-25 DIAGNOSIS — R1115 Cyclical vomiting syndrome unrelated to migraine: Secondary | ICD-10-CM

## 2021-08-28 ENCOUNTER — Telehealth: Payer: Self-pay

## 2021-08-28 NOTE — Telephone Encounter (Signed)
New message   Allison Leonard Key: [id number] help? Call us at [phone]  Outcome Additional Information Required Available without authorization. Drug Ubrelvy 100MG tablets Form [facility] Electronic [place] Form [phone] NCPDP)  [facility] is unable to respond with clinical questions. Please see more information at the bottom of the page for next steps.

## 2021-08-30 ENCOUNTER — Other Ambulatory Visit (HOSPITAL_COMMUNITY): Payer: Self-pay | Admitting: Physician Assistant

## 2021-08-30 ENCOUNTER — Telehealth: Payer: Self-pay | Admitting: Neurology

## 2021-08-30 MED ORDER — NURTEC 75 MG PO TBDP
75.0000 mg | ORAL_TABLET | ORAL | 5 refills | Status: DC | PRN
Start: 2021-08-30 — End: 2022-01-15

## 2021-08-30 NOTE — Telephone Encounter (Signed)
Pt states that the Nurtec works for her and she would like a RX sent in to the [facility] on [address]

## 2021-08-31 ENCOUNTER — Other Ambulatory Visit: Payer: Self-pay | Admitting: Radiology

## 2021-09-01 ENCOUNTER — Other Ambulatory Visit: Payer: Self-pay

## 2021-09-01 ENCOUNTER — Other Ambulatory Visit (HOSPITAL_COMMUNITY): Payer: Self-pay | Admitting: Interventional Radiology

## 2021-09-01 ENCOUNTER — Ambulatory Visit (HOSPITAL_COMMUNITY)
Admission: RE | Admit: 2021-09-01 | Discharge: 2021-09-01 | Disposition: A | Discharge status: Home / Self Care | Payer: Medicaid Other | Source: Ambulatory Visit | Attending: Interventional Radiology | Admitting: Interventional Radiology

## 2021-09-01 VITALS — BP 128/68 | HR 80 | Resp 16

## 2021-09-01 DIAGNOSIS — Z885 Allergy status to narcotic agent status: Secondary | ICD-10-CM | POA: Insufficient documentation

## 2021-09-01 DIAGNOSIS — Z79899 Other long term (current) drug therapy: Secondary | ICD-10-CM | POA: Diagnosis not present

## 2021-09-01 DIAGNOSIS — I671 Cerebral aneurysm, nonruptured: Secondary | ICD-10-CM

## 2021-09-01 DIAGNOSIS — K900 Celiac disease: Secondary | ICD-10-CM | POA: Diagnosis not present

## 2021-09-01 DIAGNOSIS — G43A0 Cyclical vomiting, not intractable: Secondary | ICD-10-CM | POA: Diagnosis not present

## 2021-09-01 DIAGNOSIS — Z888 Allergy status to other drugs, medicaments and biological substances status: Secondary | ICD-10-CM | POA: Insufficient documentation

## 2021-09-01 DIAGNOSIS — R519 Headache, unspecified: Secondary | ICD-10-CM | POA: Diagnosis not present

## 2021-09-01 DIAGNOSIS — F17210 Nicotine dependence, cigarettes, uncomplicated: Secondary | ICD-10-CM | POA: Diagnosis not present

## 2021-09-01 DIAGNOSIS — F411 Generalized anxiety disorder: Secondary | ICD-10-CM | POA: Insufficient documentation

## 2021-09-01 DIAGNOSIS — G43909 Migraine, unspecified, not intractable, without status migrainosus: Secondary | ICD-10-CM | POA: Diagnosis not present

## 2021-09-01 DIAGNOSIS — E875 Hyperkalemia: Secondary | ICD-10-CM

## 2021-09-01 HISTORY — PX: IR ANGIO INTRA EXTRACRAN SEL COM CAROTID INNOMINATE BILAT MOD SED: IMG5360

## 2021-09-01 HISTORY — PX: IR ANGIO VERTEBRAL SEL VERTEBRAL BILAT MOD SED: IMG5369

## 2021-09-01 HISTORY — PX: IR 3D INDEPENDENT WKST: IMG2385

## 2021-09-01 LAB — CBC
HCT: 39 % (ref 36.0–46.0)
Hemoglobin: 12.6 g/dL (ref 12.0–15.0)
MCH: 27.2 pg (ref 26.0–34.0)
MCHC: 32.3 g/dL (ref 30.0–36.0)
MCV: 84.2 fL (ref 80.0–100.0)
Platelets: 252 10*3/uL (ref 150–400)
RBC: 4.63 MIL/uL (ref 3.87–5.11)
RDW: 14.5 % (ref 11.5–15.5)
WBC: 10.4 10*3/uL (ref 4.0–10.5)
nRBC: 0 % (ref 0.0–0.2)

## 2021-09-01 LAB — BASIC METABOLIC PANEL
Anion gap: 6 (ref 5–15)
BUN: 13 mg/dL (ref 6–20)
CO2: 28 mmol/L (ref 22–32)
Calcium: 9.1 mg/dL (ref 8.9–10.3)
Chloride: 100 mmol/L (ref 98–111)
Creatinine, Ser: 0.55 mg/dL (ref 0.44–1.00)
GFR, Estimated: >60 mL/min (ref >60)
Glucose, Bld: 110 mg/dL — ABNORMAL HIGH (ref 70–99)
Potassium: 7.3 mmol/L (ref 3.5–5.1)
Sodium: 134 mmol/L — ABNORMAL LOW (ref 135–145)

## 2021-09-01 LAB — PROTIME-INR
INR: 0.9 (ref 0.8–1.2)
Prothrombin Time: 12.5 seconds (ref 11.4–15.2)

## 2021-09-01 LAB — POTASSIUM: Potassium: 4.5 mmol/L (ref 3.5–5.1)

## 2021-09-01 IMAGING — XA IR ANGIO INTRA EXTRACRAN SEL COM CAROTID INNOMINATE BILAT MOD SE
2 series · 13 of 24 positions shown · IV contrast (IODINE)
Comparison: CT angiogram of the head and neck [DATE].

CLINICAL DATA: History of headaches. Recent CT angiogram of the
head and neck reveals an outpouching suspicious of an aneurysm in
the intracranial right paraclinoid region, and proximal basilar
artery fenestration.

EXAM:
BILATERAL COMMON CAROTID AND INNOMINATE ANGIOGRAPHY
TECHNIQUE: Informed written consent was obtained from the patient after a
thorough discussion of the procedural risks, benefits and
alternatives. All questions were addressed. Maximal Sterile Barrier
Technique was utilized including caps, mask, sterile gowns, sterile
gloves, sterile drape, hand hygiene and skin antiseptic. A timeout
was performed prior to the initiation of the procedure.

[Series 12: 3d head sub medium ee smooth (ax3d) · axial · 0.3mm · 0.28mm/px · z∈[-174,-71]mm · 9 of 378 slices shown]
[im 1/378]
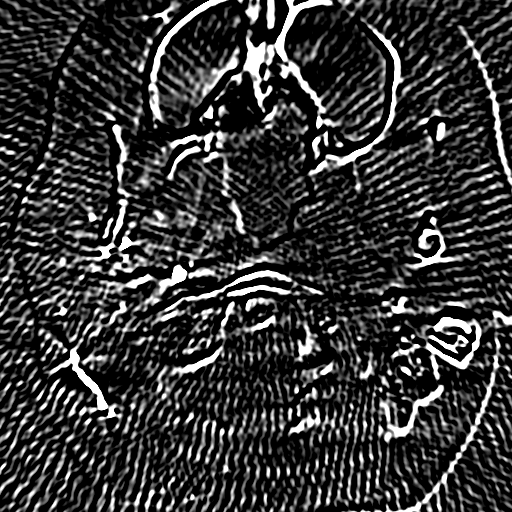
[im 48/378]
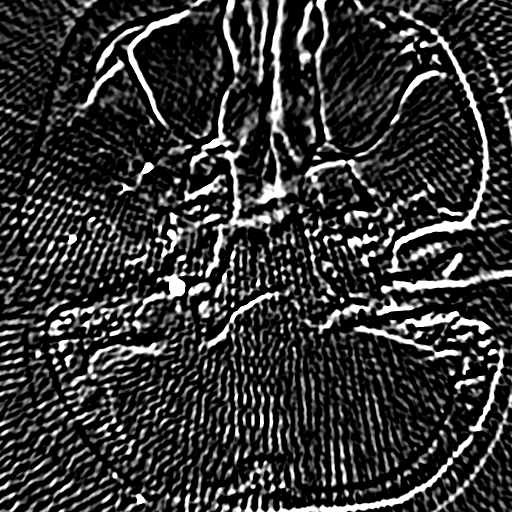
[im 95/378]
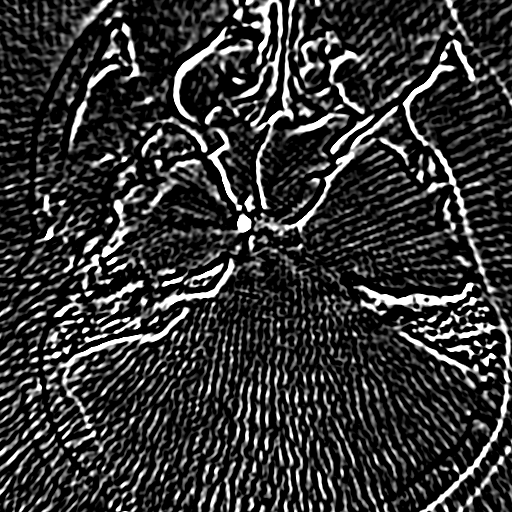
[im 142/378]
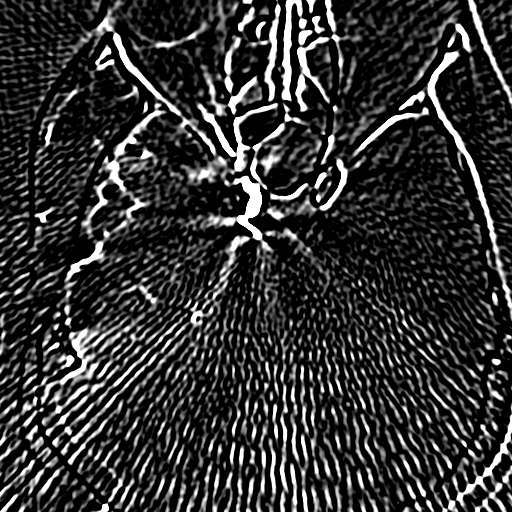
[im 189/378]
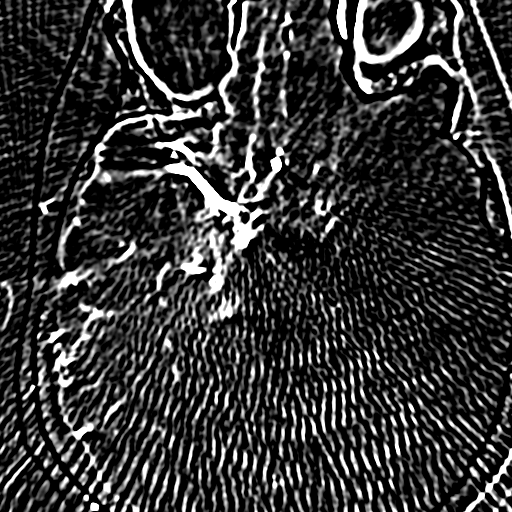
[im 236/378]
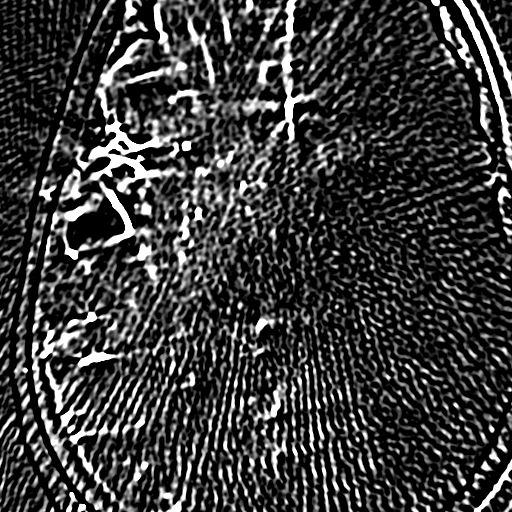
[im 283/378  full-range]
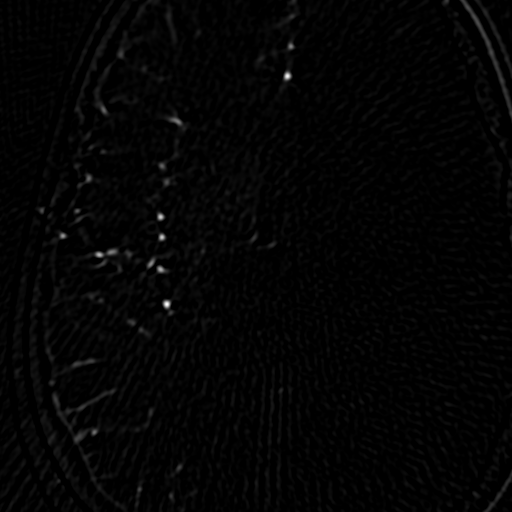
[im 307/378  full-range]
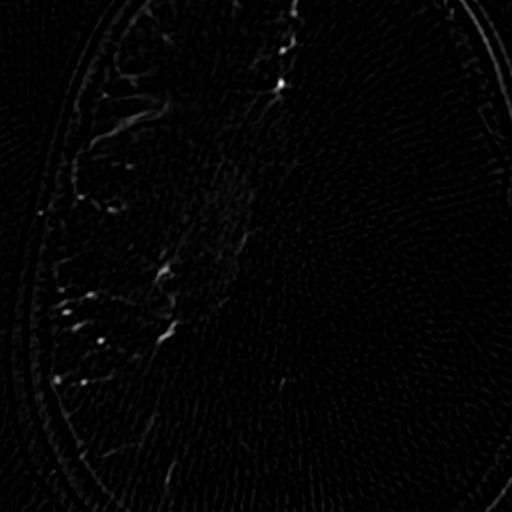
[im 354/378  full-range]
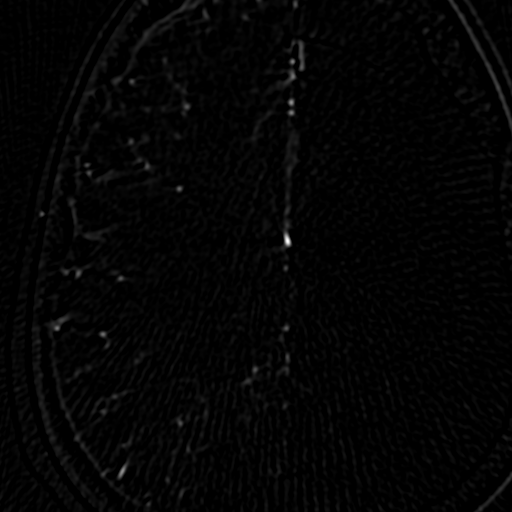

[Series 300: dr. (person_name) · 4 of 169 slices shown]
[im 1/169]
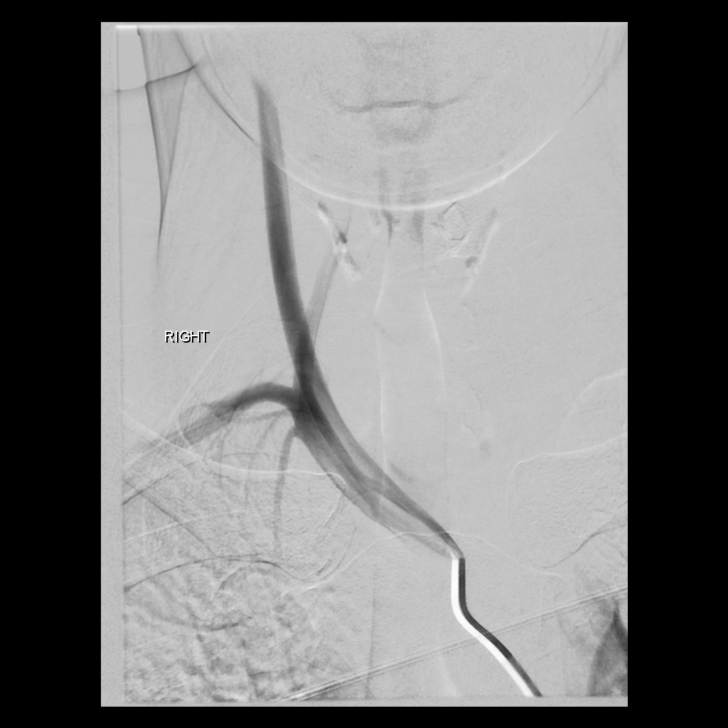
[im 57/169]
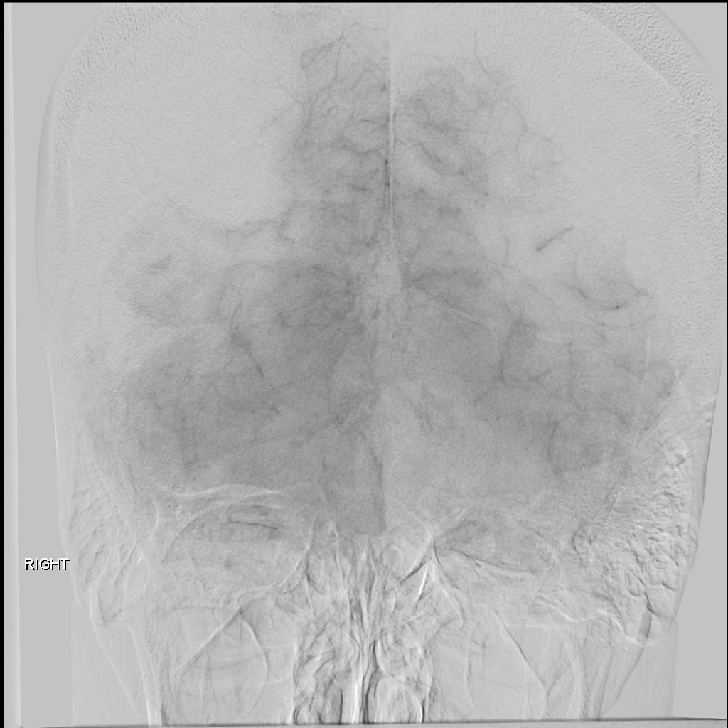
[im 113/169]
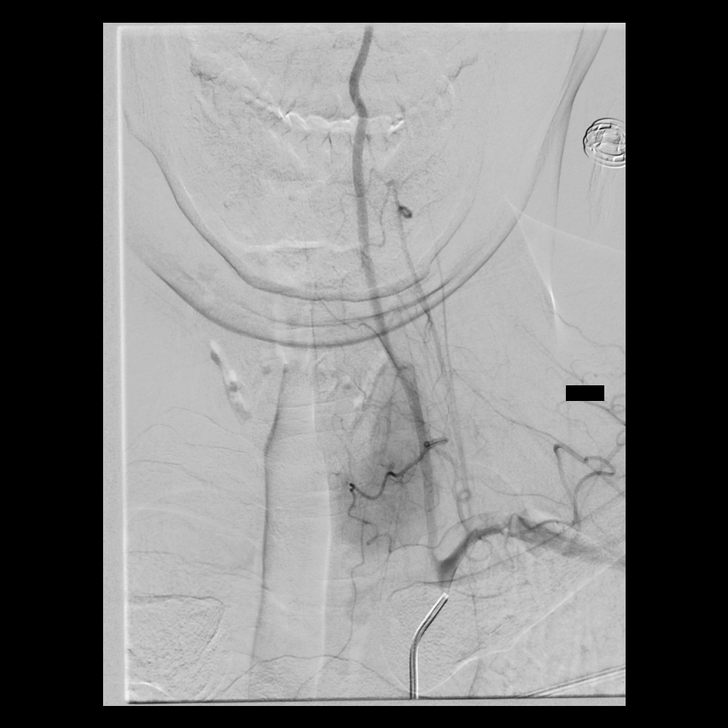
[im 169/169]
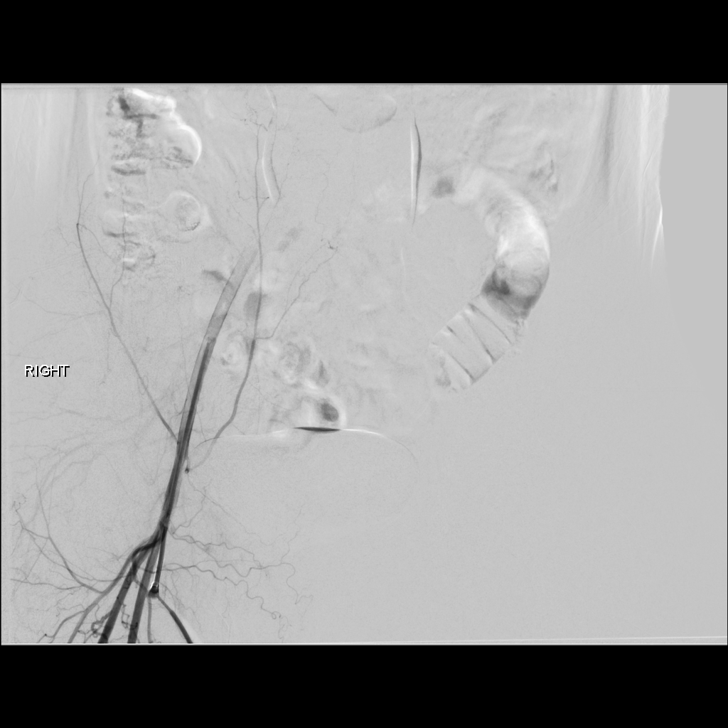

[13 of 24 positions shown; findings below may reference images not displayed]

MEDICATIONS:
Heparin [1E] units IV. No antibiotic was administered within 1 hour
of the procedure.

ANESTHESIA/SEDATION:
Versed 1 mg IV; Fentanyl 25 mcg IV

Moderate Sedation Time:  48 minutes

The patient was continuously monitored during the procedure by the
interventional radiology nurse under my direct supervision.

CONTRAST:  Omnipaque 300 proximally 65 mL.

FLUOROSCOPY TIME:  Fluoroscopy Time: 6 minutes 48 seconds (936 mGy).

COMPLICATIONS:
None immediate.
The right groin was prepped and draped in the usual sterile fashion.
Thereafter using modified Seldinger technique, transfemoral access
into the right common femoral artery was obtained without
difficulty. Over a 0.035 inch guidewire, a 5 French Pinnacle sheath
was inserted. Through this, and also over 0.035 inch guidewire, a 5
French JB 1 catheter was advanced to the aortic arch region and
selectively positioned in the right common carotid artery, the right
vertebral artery, the left common carotid artery and the left
vertebral artery.
FINDINGS: The innominate arteriogram demonstrates the right subclavian artery
proximally, and the right common carotid artery to be widely patent.
The right common carotid arteriogram demonstrates the right external
carotid artery and its major branches to be widely patent.

The right internal carotid artery at the bulb to the cranial skull
base is widely patent.

The right PCOM is seen opacifying transiently the right posterior
cerebral distribution.

Arising in the right paraclinoid region from the posterior wall of
the right intracranial internal carotid artery is a saccular
outpouching.

3D rotational arteriogram performed via the right common carotid
artery injection with reformations on a separate workstation
confirms the presence of an approximately 2.1 mm x 1.9 mm saccular
aneurysm projecting inferiorly and slightly posteriorly.

The right middle cerebral artery and the right anterior cerebral
artery opacify into the capillary and venous phases.

The right vertebral artery origin is widely patent.

The vessel opacifies to the cranial skull base. Patency is seen of
the right posterior-inferior cerebellar artery and the right
vertebrobasilar junction.

A fenestration is seen off the proximal basilar artery, with a
dominant right arm of the fenestration.

More distally, the basilar artery, the posterior cerebral arteries,
the superior cerebellar arteries and the anterior-inferior
cerebellar arteries opacify into the capillary and venous phases.

The left common carotid arteriogram demonstrates the left external
carotid artery and its major branches to be widely patent.

The left internal carotid artery at the bulb to the cranial skull
base is widely patent.

The petrous, the cavernous and the supraclinoid segments demonstrate
wide patency.

The left posterior communicating artery is seen opacifying
transiently the left posterior cerebral artery distribution.

The left anterior cerebral artery and the left middle cerebral
artery opacify into the capillary and venous phases with predominant
a flux through the right transverse sinus and right sigmoid sinus.

The non dominant left vertebral artery origin is widely patent.

The vessel opacifies to the cranial skull base where it supplies the
left posterior-inferior cerebellar artery. A developmentally
diminutive left vertebrobasilar junction is seen projecting into the
basilar artery with mixed contrast noted in the entirety of the
basilar artery on the lateral projection.
IMPRESSION: Approximate 2.1 mm x 1.9 mm saccular aneurysm arising in the right
paraclinoid intracranial ICA portion.

Fenestration of the proximal basilar artery with a dominant right
arm of the fenestration.

PLAN:
Findings were briefly discussed with the patient. Follow-up in the
clinic next week to discuss the angiographic findings and subsequent
management.

Patient expressed understanding and agreement with the above
management plan.

## 2021-09-01 MED ORDER — MIDAZOLAM HCL 2 MG/2ML IJ SOLN
INTRAMUSCULAR | Status: AC | PRN
Start: 2021-09-01 — End: 2021-09-01
  Administered 2021-09-01: 1 mg via INTRAVENOUS

## 2021-09-01 MED ORDER — HEPARIN SODIUM (PORCINE) 1000 UNIT/ML IJ SOLN
INTRAMUSCULAR | Status: AC | PRN
  Administered 2021-09-01: 1000 [IU] via INTRAVENOUS

## 2021-09-01 MED ORDER — SODIUM CHLORIDE 0.9 % IV SOLN: INTRAVENOUS | Status: AC

## 2021-09-01 MED ORDER — FENTANYL CITRATE (PF) 100 MCG/2ML IJ SOLN
INTRAMUSCULAR | Status: AC
  Filled 2021-09-01: qty 2

## 2021-09-01 MED ORDER — IOHEXOL 300 MG/ML  SOLN
100.0000 mL | Freq: Once | INTRAMUSCULAR | Status: AC | PRN
  Administered 2021-09-01: 30 mL via INTRA_ARTERIAL

## 2021-09-01 MED ORDER — LIDOCAINE HCL (PF) 1 % IJ SOLN
INTRAMUSCULAR | Status: AC | PRN
  Administered 2021-09-01: 10 mL

## 2021-09-01 MED ORDER — SODIUM CHLORIDE 0.9 % IV SOLN: INTRAVENOUS | Status: DC

## 2021-09-01 MED ORDER — HEPARIN SODIUM (PORCINE) 1000 UNIT/ML IJ SOLN
INTRAMUSCULAR | Status: AC
  Filled 2021-09-01: qty 2

## 2021-09-01 MED ORDER — SODIUM CHLORIDE 0.9 % IV SOLN
INTRAVENOUS | Status: AC | PRN
  Administered 2021-09-01: 75 mL/h via INTRAVENOUS

## 2021-09-01 MED ORDER — LIDOCAINE HCL 1 % IJ SOLN
INTRAMUSCULAR | Status: AC
  Filled 2021-09-01: qty 20

## 2021-09-01 MED ORDER — FENTANYL CITRATE (PF) 100 MCG/2ML IJ SOLN
INTRAMUSCULAR | Status: AC | PRN
  Administered 2021-09-01: 25 ug via INTRAVENOUS

## 2021-09-01 MED ORDER — MIDAZOLAM HCL 2 MG/2ML IJ SOLN
INTRAMUSCULAR | Status: AC
  Filled 2021-09-01: qty 2

## 2021-09-01 NOTE — H&P (Signed)
Chief Complaint: Patient was seen in consultation today for diagnostic cerebral angiogram.   Referring Physician(s): Dr. Tomi Likens  Supervising Physician: Luanne Bras  Patient Status: [facility] - Out-pt  History of Present Illness: Allison Leonard is a 45 y.o. female with a medical history significant for generalized anxiety disorder, Celiac disease/sprue, cyclic vomiting syndrome and migraines. An MRI/MRA was obtained for worsening migraine symptoms and multiple areas suspicious for aneurysms were identified. Additional imaging obtained.  CT Angio Head 08/02/21 IMPRESSION: 1. Unchanged 1-2 mm right paraclinoid ICA aneurysm versus infundibulum. 2. 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 3. Unremarkable noncontrast CT appearance of the brain.  Neuro Interventional Radiology asked to evaluate this patient for an image-guided diagnostic cerebral angiogram for further work up.      Past Medical History:  Diagnosis Date   Acid reflux 07/04/2018   Anemia    Celiac disease/sprue    Cyclic vomiting syndrome    Generalized anxiety disorder 07/04/2018   History of colon polyps    last colonoscopy 2018 removed multiple benign polyps   Migraines 07/04/2018    Past Surgical History:  Procedure Laterality Date   ABDOMINAL HYSTERECTOMY  2006   APPENDECTOMY  1999   COLONOSCOPY  2018   ESOPHAGOGASTRODUODENOSCOPY  2018   IR RADIOLOGIST EVAL & MGMT  08/21/2021    Allergies: Zofran [ondansetron hcl] and Morphine and related  Medications: Prior to Admission medications   Medication Sig Start Date End Date Taking? Authorizing Provider  dicyclomine (BENTYL) 20 MG tablet TAKE 1 TABLET (20 MG TOTAL) BY MOUTH 3 (THREE) TIMES DAILY AS NEEDED FOR SPASMS. Patient taking differently: Take 20 mg by mouth 3 (three) times daily. 08/25/21  Yes Thornton Park, MD  famotidine (PEPCID) 20 MG tablet Take 1 tablet (20 mg total) by mouth 2 (two) times daily.  07/05/21  Yes Thornton Park, MD  nortriptyline (PAMELOR) 25 MG capsule Take 1 capsule (25 mg total) by mouth at bedtime. 06/27/21  Yes Jaffe, Adam R, DO  Plecanatide (TRULANCE) 3 MG TABS Take 3 mg by mouth daily. 07/05/21  Yes Thornton Park, MD  prochlorperazine (COMPAZINE) 10 MG tablet TAKE 1 TABLET BY MOUTH EVERY 8 HOURS AS NEEDED FOR NAUSEA AND VOMITING 08/29/21  Yes Brimage, Vondra, DO  Rimegepant Sulfate (NURTEC) 75 MG TBDP Samples of this drug were given to the patient, quantity 2, Lot Number [id number] exp 07/2023 Patient taking differently: Take 75 mg by mouth daily as needed (migraines). Samples of this drug were given to the patient, quantity 2, Lot Number [id number] exp 07/2023 06/27/21  Yes Tomi Likens, Adam R, DO  Rimegepant Sulfate (NURTEC) 75 MG TBDP Take 75 mg by mouth as needed (Take 1 tab at the earlist onset of Migraine. max 1 tab in 24 hours). 08/30/21  Yes Tomi Likens, Adam R, DO  metoCLOPramide (REGLAN) 10 MG tablet Take 1 tablet (10 mg total) by mouth every 6 (six) hours as needed for nausea. Patient not taking: Reported on 08/29/2021 07/05/21   Thornton Park, MD     Family History  Problem Relation Age of Onset   Diabetes Father    Hypertension Father    Hyperlipidemia Father    Colon polyps Father    Heart attack Maternal Grandmother    Heart attack Paternal Grandmother    Cervical cancer Maternal Aunt    Stomach cancer Maternal Aunt    Throat cancer Maternal Uncle    Stroke Maternal Uncle    Down syndrome Son  Colon cancer Sister    Esophageal cancer Neg Hx    Rectal cancer Neg Hx     Social History   Socioeconomic History   Marital status: Single    Spouse name: Not on file   Number of children: 3   Years of education: Not on file   Highest education level: Not on file  Occupational History   Not on file  Tobacco Use   Smoking status: Some Days    Packs/day: 0.50    Years: 7.00    Pack years: 3.50    Types: Cigarettes   Smokeless tobacco: Never  Vaping  Use   Vaping Use: Never used  Substance and Sexual Activity   Alcohol use: Not Currently   Drug use: Not Currently    Types: Marijuana    Comment: 04/11/20,last  had   Sexual activity: Not on file  Other Topics Concern   Not on file  Social History Narrative   Right handed   [employer]   Drinks coffee occasionally   Social Determinants of Health   Financial Resource Strain: Not on file  Food Insecurity: Not on file  Transportation Needs: Not on file  Physical Activity: Not on file  Stress: Not on file  Social Connections: Not on file    Review of Systems: A 12 point ROS discussed and pertinent positives are indicated in the HPI above.  All other systems are negative.  Review of Systems  Constitutional:  Negative for appetite change and fatigue.  Cardiovascular:  Negative for chest pain and leg swelling.  Gastrointestinal:  Positive for nausea. Negative for abdominal distention, diarrhea and vomiting.  Neurological:  Positive for headaches. Negative for dizziness.   Vital Signs: BP 131/81 (BP Location: Right Arm)   Pulse 82   Resp 16   Physical Exam Constitutional:      General: She is not in acute distress.    Appearance: She is not ill-appearing.  HENT:     Mouth/Throat:     Mouth: Mucous membranes are moist.     Pharynx: Oropharynx is clear.  Cardiovascular:     Rate and Rhythm: Normal rate and regular rhythm.  Pulmonary:     Effort: Pulmonary effort is normal.     Breath sounds: Normal breath sounds.  Abdominal:     General: Bowel sounds are normal.     Palpations: Abdomen is soft.  Musculoskeletal:        General: Normal range of motion.  Skin:    General: Skin is warm and dry.  Neurological:     Mental Status: She is alert and oriented to person, place, and time.    Imaging: IR Radiologist Eval & Mgmt  Result Date: 08/21/2021 EXAM: NEW PATIENT OFFICE VISIT CHIEF COMPLAINT: The patient has been referred by Dr. Tomi Likens, her neurologist in view of the  recent MRI MRA findings in relation to the patient's history of intractable migrainous headaches. Current Pain Level: 1-10 HISTORY OF PRESENT ILLNESS: History was obtained from the patient, and also on review of previous notes and of neuro imaging. Patient reports a history of headaches as a teenager with gradual evolution over a period of years. Patient has had off and on treatments with multiple anti migrainous related medications without persistent clinical benefit. Patient reports history of headaches of a 3-4 times per week which may last a whole day, and may be proceeded by blurred vision without blindness or diplopia and generalized sense of weakness. This management usually results  in severe generalized headache or may be generalized to one side or the other side. She describes these headaches as pounding associated with photophobia, nausea and occasional vomiting. Following the episode, the patient feels extremely tired and spent out. She reports no precipitating or exacerbating factors. Recently the patient was started on Nurtec as a rescue medication which appears to alleviate her headaches rather quickly according to the patient. Patient also reports occasional episodes of palpitations which may occur 2 to 3 times a week are transitory and non debilitating not associated with chest pain or shortness of breath or diaphoresis. More specifically, the patient reports a reasonable constant weight. Patient reports she does get hungry although is not a big eater. She denies any wheezing, coughing, sputum production or hemoptysis. She does carry a history of cyclic vomiting syndrome. She denies any difficulty with swallowing, chewing, vomiting, or of abdominal cramps or constipation, diarrhea or melena. She denies any dysuria, frequency of micturition, or of hematuria. Diagnosis * : Date . * : Acid reflux * : 07/04/2018 . * : Anemia * : . * : Celiac disease/sprue * : . * : Cyclic vomiting syndrome * : . * :  Generalized anxiety disorder * : 07/04/2018 . * : History of colon polyps * : * : last colonoscopy 2018 removed multiple benign polyps . * : Migraines * : 07/04/2018 MEDICATIONS: Current Outpatient Medications on File Prior to Visit Medication * : Sig * : Dispense * : Refill . * : dicyclomine (BENTYL) 20 MG tablet * : Take 1 tablet (20 mg total) by mouth 3 (three) times daily as needed for spasms. * : 30 tablet * : 0 . * : famotidine (PEPCID) 20 MG tablet * : TAKE 1 TABLET BY MOUTH TWICE A DAY * : 60 tablet * : 2 . * : Hyoscyamine Sulfate SL (LEVSIN/SL) 0.125 MG SUBL * : Place 1 each under the tongue 4 (four) times daily as needed for up to 5 days. * : 30 tablet * : 0 . * : lidocaine (XYLOCAINE) 2 % solution * : Use as directed 15 mLs in the mouth or throat every 6 (six) hours as needed for mouth pain. (Patient not taking: Reported on 04/15/2020) * : 100 mL * : 0 . * : LORazepam (ATIVAN) 0.5 MG tablet * : Take 2 tablets (1 mg total) by mouth 3 (three) times daily as needed (nausea and vomiting). (Patient not taking: Reported on 04/15/2020) * : 30 tablet * : 0 . * : metoCLOPramide (REGLAN) 10 MG tablet * : Take 1 tablet (10 mg total) by mouth every 6 (six) hours. * : 15 tablet * : 0 . * : pantoprazole (PROTONIX) 20 MG tablet * : TAKE 1 TABLET BY MOUTH EVERY DAY * : 90 tablet * : 1 . * : prochlorperazine (COMPAZINE) 10 MG tablet * : TAKE 1 TABLET BY MOUTH EVERY 8 HOURS AS NEEDED FOR NAUSEA AND VOMITING * : 90 tablet * : 0 . * : promethazine (PHENERGAN) 25 MG tablet * : TAKE 1 TABLET BY MOUTH EVERY 6 HOURS AS NEEDED FOR NAUSEA OR VOMITING. * : 12 tablet * : 0 . * : SUMAtriptan (IMITREX) 20 MG/ACT nasal spray * : Place 1 spray into nose at earliest onset of migraine. May repeat in 2 hours if headache persists or recurs. Maximum 2 sprays in 24 hours * : 1 Inhaler * : 5 . * : topiramate (TOPAMAX) 100 MG tablet * : TAKE  1 TABLET BY MOUTH EVERYDAY AT BEDTIME * : 90 tablet * : 0 . * : Ubrogepant (UBRELVY) 100 MG TABS * : Take 1  tablet by mouth as needed (May repeat 1 tablet in 2 hours. Maximum 2 tablets in 24 hours.). * : 10 tablet * : 5 Current Facility-Administered Medications on File Prior to Visit Medication * : Dose * : Route * : Frequency * : Provider * : Last Rate * : Last Admin . * : 0.9 %  sodium chloride infusion * : 500 mL * : Intravenous * : Once * : Thornton Park, MD * : * : ALLERGIES: Allergies Allergen * : Reactions . * : Zofran Ondansetron Hcl * : Anaphylaxis . * : Morphine And Related * : Itching FAMILY HISTORY: Family History Problem * : Relation * : Age of Onset . * : Diabetes * : Father * : . * : Hypertension * : Father * : . * : Hyperlipidemia * : Father * : . * : Colon polyps * : Father * : . * : Heart attack * : Maternal Grandmother * : . * : Heart attack * : Paternal Grandmother * : . * : Cervical cancer * : Maternal Aunt * : . * : Stomach cancer * : Maternal Aunt * : . * : Throat cancer * : Maternal Uncle * : . * : Stroke * : Maternal Uncle * : . * : Down syndrome * : Son * : . * : Colon cancer * : Sister * : . * : Esophageal cancer * : Neg Hx * : . * : Rectal cancer * : Neg Hx * : REVIEW OF SYSTEMS: Negative unless as mentioned above. PHYSICAL EXAMINATION: Alert, awake appears somewhat depressed due to the headaches. Affect appropriate, otherwise. Responses prompt. Speech and comprehension normal. No lateralizing abnormal neurological features on gross examination. ASSESSMENT AND PLAN: The patient's recent MRI MRA findings reviewed with the patient. She was informed of the outpouching suspicious of aneurysms in the right intra cavernous, the right posterior cerebral artery P1 segment, and in the basilar artery proximally projecting left laterally inferiorly. Given the patient's persistent history of intractable headaches, it was suggested that the next step in further characterizing these abnormalities intracranially would be to do a formal catheter arteriogram. The procedure was discussed in detail. The  approach would be either radial or femoral. Extreme low risk of the procedure was also reviewed with the patient. Depending on the results of the diagnostic catheter arteriogram, further management consideration would be discussed. Questions were answered to her satisfaction. The patient would like to proceed with a diagnostic catheter arteriogram as soon as possible. In the meantime, the patient advised to refrain from smoking marijuana and cigarettes. The patient leaves with a good understanding and agreement with the above management plan. Electronically Signed   By: Luanne Bras M.D.   On: 08/21/2021 08:23    Labs:  CBC: Recent Labs    03/10/21 1414 09/01/21 1000  WBC 9.0 10.4  HGB 13.6 12.6  HCT 41.7 39.0  PLT 222 252    COAGS: Recent Labs    09/01/21 1000  INR 0.9    BMP: Recent Labs    03/10/21 1414 09/01/21 1000 09/01/21 1105  NA 142 134*  --   K 4.8 7.3* 4.5  CL 100 100  --   CO2 26 28  --   GLUCOSE 148* 110*  --   BUN 12 13  --  CALCIUM 10.1 9.1  --   CREATININE 0.68 0.55  --   GFRNONAA  --  >60  --     LIVER FUNCTION TESTS: Recent Labs    03/10/21 1414  BILITOT 0.2  AST 17  ALT 12  ALKPHOS 78  PROT 7.8  ALBUMIN 5.0*    TUMOR MARKERS: No results for input(s): AFPTM, CEA, CA199, CHROMGRNA in the last 8760 hours.  Assessment and Plan:  Chronic migraines; aneurysms: Deniz S. Pacini, 45 year old female, presents today for an image-guided diagnostic cerebral angiogram.  Risks and benefits of this procedure were discussed with the patient including, but not limited to bleeding, infection, vascular injury or contrast induced renal failure.  This interventional procedure involves the use of X-rays and because of the nature of the planned procedure, it is possible that we will have prolonged use of X-ray fluoroscopy.  Potential radiation risks to you include (but are not limited to) the following: - A slightly elevated risk for cancer   several years later in life. This risk is typically less than 0.5% percent. This risk is low in comparison to the normal incidence of human cancer, which is 33% for women and 50% for men according to the [employer]. - Radiation induced injury can include skin redness, resembling a rash, tissue breakdown / ulcers and hair loss (which can be temporary or permanent).   The likelihood of either of these occurring depends on the difficulty of the procedure and whether you are sensitive to radiation due to previous procedures, disease, or genetic conditions.   IF your procedure requires a prolonged use of radiation, you will be notified and given written instructions for further action.  It is your responsibility to monitor the irradiated area for the 2 weeks following the procedure and to notify your physician if you are concerned that you have suffered a radiation induced injury.    All of the patient's questions were answered, patient is agreeable to proceed. She has been NPO. Labs and vitals have been reviewed.   Consent signed and in chart.  Thank you for this interesting consult.  I greatly enjoyed meeting Allison Leonard and look forward to participating in their care.  A copy of this report was sent to the requesting provider on this date.  Electronically Signed: Soyla Dryer, AGACNP-BC [phone] 09/01/2021, 12:45 PM   I spent a total of  30 Minutes   in face to face in clinical consultation, greater than 50% of which was counseling/coordinating care for diagnostic cerebral angiogram

## 2021-09-01 NOTE — Progress Notes (Signed)
Discharge instructions reviewed with pt and her mother (via telephone) both voice understanding.

## 2021-09-01 NOTE — Sedation Documentation (Signed)
Patient transported to short stay. Lisabeth Pick RN to the bedside to receive the patient. Groin site assessed. Clean, dry and intact. No drainage noted from dressing. Soft to touch upon palpation. No hematoma noted. +2 distal pulses intact.

## 2021-09-01 NOTE — Discharge Instructions (Signed)
Femoral Site Care This sheet gives you information about how to care for yourself after your procedure. Your health care provider may also give you more specific instructions. If you have problems or questions, contact your health care provider. What can I expect after the procedure? After the procedure, it is common to have: Bruising that usually fades within 1-2 weeks. Tenderness at the site. Follow these instructions at home: Wound care May remove bandage after 24 hours. Do not take baths, swim, or use a hot tub for 5 days. You may shower 24-48 hours after the procedure. Gently wash the site with plain soap and water. Pat the area dry with a clean towel. Do not rub the site. This may cause bleeding. Do not apply powder or lotion to the site. Keep the site clean and dry. Check your femoral site every day for signs of infection. Check for: Redness, swelling, or pain. Fluid or blood. Warmth. Pus or a bad smell. Activity For the first 2-3 days after your procedure, or as long as directed: Avoid climbing stairs as much as possible. Do not squat. Do not lift, push or pull anything that is heavier than 10 lb for 5 days. Rest as directed. Avoid sitting for a long time without moving. Get up to take short walks every 1-2 hours. Do not drive for 24 hours. General instructions Take over-the-counter and prescription medicines only as told by your health care provider. Keep all follow-up visits as told by your health care provider. This is important. DRINK PLENTY OF FLUIDS FOR THE NEXT 2-3 DAYS. Contact a health care provider if you have: A fever or chills. You have redness, swelling, or pain around your insertion site. Get help right away if: The catheter insertion area swells very fast. You pass out. You suddenly start to sweat or your skin gets clammy. The catheter insertion area is bleeding, and the bleeding does not stop when you hold steady pressure on the area. The area near or  just beyond the catheter insertion site becomes pale, cool, tingly, or numb. These symptoms may represent a serious problem that is an emergency. Do not wait to see if the symptoms will go away. Get medical help right away. Call your local emergency services (911 in the [place] Do not drive yourself to the hospital. Summary After the procedure, it is common to have bruising that usually fades within 1-2 weeks. Check your femoral site every day for signs of infection. Do not lift, push or pull anything that is heavier than 10 lb for 5 days.  This information is not intended to replace advice given to you by your health care provider. Make sure you discuss any questions you have with your health care provider. Document Revised: 11/04/2017 Document Reviewed: 11/04/2017 Elsevier Patient Education  2020 [employer].

## 2021-09-01 NOTE — Sedation Documentation (Signed)
5 Fr. Exoseal to right groin deployed

## 2021-09-01 NOTE — Procedures (Signed)
S/P 4 vessel cerebral artrriogram RT CFA approach n Findings. 1.Approx 86m x 1.8 mm RT ICA paraclinoid aneurysm. 2.Prox  basilar artery fenestration. S.Alysse Rathe MD

## 2021-09-01 NOTE — Sedation Documentation (Signed)
ETC02 removed per Dr. Estanislado Pandy

## 2021-09-01 NOTE — Progress Notes (Signed)
Becky/ main lab called with critical K+ 7.3, spoke with Candiss Norse PA, repeat K+ and will monitor.

## 2021-09-04 ENCOUNTER — Other Ambulatory Visit (HOSPITAL_COMMUNITY): Payer: Self-pay | Admitting: Interventional Radiology

## 2021-09-04 DIAGNOSIS — I671 Cerebral aneurysm, nonruptured: Secondary | ICD-10-CM

## 2021-09-06 ENCOUNTER — Other Ambulatory Visit: Payer: Self-pay

## 2021-09-06 ENCOUNTER — Ambulatory Visit (HOSPITAL_COMMUNITY)
Admission: RE | Admit: 2021-09-06 | Discharge: 2021-09-06 | Disposition: A | Discharge status: Home / Self Care | Payer: Medicaid Other | Source: Ambulatory Visit | Attending: Interventional Radiology | Admitting: Interventional Radiology

## 2021-09-06 DIAGNOSIS — I671 Cerebral aneurysm, nonruptured: Secondary | ICD-10-CM | POA: Diagnosis not present

## 2021-09-06 DIAGNOSIS — Z7902 Long term (current) use of antithrombotics/antiplatelets: Secondary | ICD-10-CM | POA: Diagnosis not present

## 2021-09-07 HISTORY — PX: IR RADIOLOGIST EVAL & MGMT: IMG5224

## 2021-09-08 ENCOUNTER — Telehealth: Payer: Self-pay

## 2021-09-08 ENCOUNTER — Ambulatory Visit (INDEPENDENT_AMBULATORY_CARE_PROVIDER_SITE_OTHER): Payer: Medicaid Other | Admitting: Gastroenterology

## 2021-09-08 ENCOUNTER — Other Ambulatory Visit (HOSPITAL_COMMUNITY): Payer: Self-pay | Admitting: Interventional Radiology

## 2021-09-08 ENCOUNTER — Encounter: Payer: Self-pay | Admitting: Gastroenterology

## 2021-09-08 ENCOUNTER — Telehealth: Payer: Self-pay | Admitting: Internal Medicine

## 2021-09-08 VITALS — BP 100/70 | HR 97 | Ht 62.0 in | Wt 111.4 lb

## 2021-09-08 DIAGNOSIS — R1013 Epigastric pain: Secondary | ICD-10-CM | POA: Diagnosis not present

## 2021-09-08 DIAGNOSIS — I671 Cerebral aneurysm, nonruptured: Secondary | ICD-10-CM

## 2021-09-08 DIAGNOSIS — K5909 Other constipation: Secondary | ICD-10-CM

## 2021-09-08 MED ORDER — DICYCLOMINE HCL 20 MG PO TABS: 20.0000 mg | ORAL_TABLET | Freq: Three times a day (TID) | ORAL | 11 refills | Status: DC | PRN

## 2021-09-08 MED ORDER — CLOPIDOGREL BISULFATE 75 MG PO TABS: 75.0000 mg | ORAL_TABLET | Freq: Every day | ORAL | 2 refills | Status: DC

## 2021-09-08 MED ORDER — TRULANCE 3 MG PO TABS
3.0000 mg | ORAL_TABLET | Freq: Every day | ORAL | 11 refills | Status: DC
Start: 2021-09-08 — End: 2023-08-13

## 2021-09-08 MED ORDER — FAMOTIDINE 20 MG PO TABS
20.0000 mg | ORAL_TABLET | Freq: Two times a day (BID) | ORAL | 3 refills | Status: DC
Start: 2021-09-08 — End: 2022-09-17

## 2021-09-08 NOTE — Patient Instructions (Addendum)
It was my pleasure to provide care to you today. Based on our discussion, I am providing you with my recommendations below:  RECOMMENDATION(S):   Continue to take Trulance every day.  All prescriptions have been sent with a years worth of refills  Your constipation may worsen as you take medications about your brain surgery. During that time, it will be particularly important that you eat at least 25-30 grams of fiber daily and drink at least 64 ounces of water daily. You will want to gradually increase the fiber in your diet to avoid bloating. You may increase the fiber through diet and through fiber supplements including psyllium and methycellulose.   Natural laxatives include prunes, apples, apricots, cherries, peaches, pears, aloe, rhubarb, kiwi, bananas, mango, papaya, and watermelon. In particular, two kiwi a day has been show to cause less likely to cause bloating than prunes or psyllium.  As long as you have healthy kidneys, another options is using magnesium oxide supplements. I recommend starting with 500 mg daily. You could increase the dose to 1000 mg daily after one week if that doesn't seem to be helping.   FOLLOW UP:  I would like for you to follow up with me annually. Please call the office at [phone] to schedule your appointment.  BMI:  If you are age 68 or younger, your body mass index should be between 19-25. Your Body mass index is 20.37 kg/m. If this is out of the aformentioned range listed, please consider follow up with your Primary Care Provider.   MY CHART:  The [facility] providers would like to encourage you to use [facility] to communicate with providers for non-urgent requests or questions.  Due to long hold times on the telephone, sending your provider a message by [facility] may be a faster and more efficient way to get a response.  Please allow 48 business hours for a response.  Please remember that this is for non-urgent requests.   Thank you for trusting  me with your gastrointestinal care!    Thornton Park, MD, MPH

## 2021-09-08 NOTE — Telephone Encounter (Signed)
New message   Your information has been sent to [facility].  Allison Leonard (Key: [id number]) Rx #: [id number] Nurtec 75MG dispersible tablets   Form [facility] Electronic [place] Form [phone] NCPDP) Created 9 days ago Sent to Plan 8 minutes ago Plan Response 8 minutes ago Submit Clinical Questions less than a minute ago Determination Wait for Determination Please wait for [facility] to return a determination.

## 2021-09-08 NOTE — Progress Notes (Signed)
RX for Plavix sent to pharmacy on file. Pt has been instructed when to start Plavix and 81 mg ASA prior to her procedure.  Narda Rutherford, AGNP-BC 09/08/2021, 1:20 PM

## 2021-09-08 NOTE — Progress Notes (Signed)
Referring Provider: Lyndee Hensen, DO Primary Care Physician:  Lyndee Hensen, DO  Chief complaint:  Constipation   IMPRESSION:  Chronic constipation with associated constipation Heartburn with nausea and vomiting History of colon polyps    -5 tubular adenomas removed in 2015    -1 hyperplastic polyp removed in 2018    - 2 tubular adenomas 2021 (44m cecal, 2211mdescending colon) Family history of colon cancer (sister at age 45   - surveillance colonoscopy 2026  Constipation and associated abdominal pain are controlled on Trulance. May worsen after surgery for her cerebral aneurysm and we discussed strategies today. Consider anorectal manometry and biofeedback if symptoms recur given the high index of suspicion for concurrent pelvic floor dyssynergia.   PLAN: - Continue Pepcid and Bentyl - Continue Trulance 11m8maily (Refilled) - Follow-up in 12 months, earlier if needed  HPI: [name] a 45 55o. female who was initially seen in 2020 and 2021 for nausea, vomiting, and upper abdominal pain.  This is my first office visit with Allison Leonard.  She has anxiety, migraine headaches, cyclic vomiting with cannabinoid hyperemesis syndrome, and a history of colon polyps.  Has had a prior hysterectomy and appendectomy.  Evaluation for nausea, vomiting, and diarrhea included an abdominal ultrasound, incomplete gastric emptying scan, normal CT angiogram of the abdomen and pelvis.  At the time of her last visit 07/05/2021 she reported constipation having 9 days between t BM. Normally having 3 BM weekly. Fleet suppositories, Miralax QD-BID x 7 days, OTC laxative. Associated straining. Sense of incomplete evacuation. No response to Linzess in the past.  No blood or mucous in the stool.  She was started on Trulance 3 mg daily with samples provided.  She returns in scheduled follow-up.  Her abdominal pain has resolved and she is having a formed bowel movement every day to every  other day on Trulance. Has some sense of incomplete evacuation. No blood or mucous.   Recently diagnosed with a cerebral aneurysm during the evaluation of headaches. She is expecting surgery in the near future.    Prior endoscopic evaluation: - EGD 2015: Normal.  Duodenal biopsies negative for celiac disease - Colonoscopy 2015 in [place] tubular adenomas - Colonoscopy 2018: Sessile hyperplastic polyp sigmoid, surveillance colonoscopy recommended in 2021 - EGD 04/15/2020: Chronic gastritis without H. pylori, normal duodenal biopsies, reflux, no eosinophilic esophagitis - Colonoscopy 04/15/2020.  Cecal tubular adenoma descending: >68m15mbular adenoma call the office after the procedure reporting constipation.  Was instructed to take MiraLAX.  Sister with stage IV colon cancer diagnosed at the age of 46. 35ast Medical History:  Diagnosis Date   Acid reflux 07/04/2018   Anemia    Brain aneurysm 2022   Celiac disease/sprue    Cyclic vomiting syndrome    Generalized anxiety disorder 07/04/2018   History of colon polyps    last colonoscopy 2018 removed multiple benign polyps   Migraines 07/04/2018    Past Surgical History:  Procedure Laterality Date   ABDOMINAL HYSTERECTOMY  2006   APPENDECTOMY  1999   COLONOSCOPY  2018   ESOPHAGOGASTRODUODENOSCOPY  2018   IR 3D INDEPENDENT WKST  09/01/2021   IR ANGIO INTRA EXTRACRAN SEL COM CAROTID INNOMINATE BILAT MOD SED  09/01/2021   IR ANGIO VERTEBRAL SEL VERTEBRAL BILAT MOD SED  09/01/2021   IR RADIOLOGIST EVAL & MGMT  08/21/2021   IR RADIOLOGIST EVAL & MGMT  09/07/2021      Current Outpatient Medications  Medication Sig Dispense  Refill   nortriptyline (PAMELOR) 25 MG capsule Take 1 capsule (25 mg total) by mouth at bedtime. 30 capsule 5   prochlorperazine (COMPAZINE) 10 MG tablet TAKE 1 TABLET BY MOUTH EVERY 8 HOURS AS NEEDED FOR NAUSEA AND VOMITING 90 tablet 0   Rimegepant Sulfate (NURTEC) 75 MG TBDP Take 75 mg by mouth as needed  (Take 1 tab at the earlist onset of Migraine. max 1 tab in 24 hours). 16 tablet 5   dicyclomine (BENTYL) 20 MG tablet Take 1 tablet (20 mg total) by mouth 3 (three) times daily as needed for spasms. 30 tablet 11   famotidine (PEPCID) 20 MG tablet Take 1 tablet (20 mg total) by mouth 2 (two) times daily. 180 tablet 3   Plecanatide (TRULANCE) 3 MG TABS Take 3 mg by mouth daily. 30 tablet 11   No current facility-administered medications for this visit.    Allergies as of 09/08/2021 - Review Complete 09/08/2021  Allergen Reaction Noted   Zofran [ondansetron hcl] Anaphylaxis 10/17/2018   Morphine and related Itching 06/23/2018    Family History  Problem Relation Age of Onset   Diabetes Father    Hypertension Father    Hyperlipidemia Father    Colon polyps Father    Heart attack Maternal Grandmother    Heart attack Paternal Grandmother    Cervical cancer Maternal Aunt    Stomach cancer Maternal Aunt    Throat cancer Maternal Uncle    Stroke Maternal Uncle    Down syndrome Son    Colon cancer Sister    Esophageal cancer Neg Hx    Rectal cancer Neg Hx     Physical Exam: General:   Alert,  well-nourished, pleasant and cooperative in NAD Head:  Normocephalic and atraumatic. Eyes:  Sclera clear, no icterus.   Conjunctiva pink. Abdomen:  Soft, nontender, nondistended, normal bowel sounds, no rebound or guarding. No hepatosplenomegaly.   Rectal:  Deferred  Msk:  Symmetrical. No boney deformities LAD: No inguinal or umbilical LAD Extremities:  No clubbing or edema. Neurologic:  Alert and  oriented x4;  grossly nonfocal Skin:  Intact without significant lesions or rashes. Psych:  Alert and cooperative. Normal mood and affect.    Lylie Blacklock L. Tarri Glenn, MD, MPH 09/08/2021, 10:03 AM

## 2021-09-11 NOTE — Telephone Encounter (Signed)
F/u   Allison Leonard (Key: [id number]) Nurtec 75MG dispersible tablets   Form Refer to Determination Appeal Form Appeal Created 3 days ago Sent to Plan less than a minute ago Determination Wait for Determination Please wait for the payer to return a determination.

## 2021-09-11 NOTE — Telephone Encounter (Signed)
F/u   Allison Leonard (Key: [id number]) Rx #: [id number] Nurtec 75MG dispersible tablets   Form [facility] Electronic [place] Form [phone] NCPDP) Created 12 days ago Sent to Plan 3 days ago Plan Response 3 days ago Submit Clinical Questions 3 days ago Determination Unfavorable 3 days ago Your prior authorization request has been denied. COMPLETE APPEAL Your request for prior authorization was denied, but an appeal is available for your patient. For assistance, contact our support team at [phone].  Message from plan: PA Case: [id number], Status: Denied. Notification: Completed.

## 2021-09-19 ENCOUNTER — Telehealth: Payer: Self-pay | Admitting: Radiology

## 2021-09-19 NOTE — Telephone Encounter (Signed)
-----   Message from Danielle Dess sent at 09/19/2021  8:59 AM EST ----- Regarding: Bleeding with Plavix [name],   Allison Leonard is schedule with D on Monday for treatment. She started her Plavix on 11/11. She called and said that since she has started taking the Plavix, she has started spotting vaginally and this is not normal for her. She called the emergency room and they advised her to let Deveshwar know because it may be related to the blood thinner. Please advise.   Thanks,  Lia Foyer

## 2021-09-19 NOTE — Progress Notes (Signed)
Patient ID: Allison Leonard, female   DOB: [birth year], 45 y.o.   MRN: [id number]  45 yo. Female. History of migraines. 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 Patient scheduled for intervention on 11.21.22. In preparation for the procedure the Allison Leonard was placed on dual anticoagulation per protocol. Patient called NIR reporting vaginal bleeding. Patient states that she had a hysterectomy on 2006.  Patient describes the bleeding as intermittent spotting X 2 days that has since resolved. She denies any nausea, vomiting, diarrhea or abdominal/ pelvic cramping. Case discussed with [facility] Attending Dr. Estanislado Pandy who recommends Patient stop Plavix and be seen by her PCP for workup on potential causation. Allison Leonard was communicated directly to Allison Leonard who is in agreement with the plan of care. Patient will contact [place] office if she in unable to coordinate her appointment prior to scheduled NIR lab work on 11.17.22.

## 2021-09-20 ENCOUNTER — Telehealth (HOSPITAL_COMMUNITY): Payer: Self-pay

## 2021-09-20 NOTE — Telephone Encounter (Signed)
Pt agreed to f/u with PCP. She will call back after she has seen them and will discuss rescheduling. Surgery is canceled for Monday, 09/25/21. Pt agreed with this plan. AW

## 2021-09-20 NOTE — Telephone Encounter (Signed)
-----   Message from Tyson Alias, NP sent at 09/20/2021 12:43 PM EST ----- Regarding: RE: Bleeding with Plavix Hello Caryl Pina, Dr. D said that she needs to continue NOT taking Plavix. He also does not want her to come in for labs since she in not taking Plavix as well.  She needs to follow up with PCP to figure out why she is bleeding. Once that has been established, she can call us back with further plans.   Thanks,  Manuela Schwartz   ----- Message ----- From: Danielle Dess Sent: 09/20/2021  10:38 AM EST To: Tyson Alias, NP Subject: FW: Bleeding with Plavix                       Manuela Schwartz,   Please view message below. I just spoke with the patient and she is not able to get in to see her PCP before Monday. She is supposed to come tomorrow for a p2y12. She also said that she isn't bleeding now. Please let me know what to do.   Thanks,  Lia Foyer ----- Message ----- From: Jacqualine Mau, NP Sent: 09/19/2021   3:09 PM EST To: Danielle Dess Subject: RE: Bleeding with Plavix                       Fran Lowes, I spoke to D and he is recommending that she stop her plavix and be worked up by her PCP for causes. I spoke to her and she is in agreement with the plan of care. She is suppose to come in for labwork for [place] on Thursday. She is going to try and see her PCP before then. I asked her to reach back out to [place] if she could not. D says he does not want to perform a procedure on someone who is bleeding if we do not know why.  We may have to reschedule.  I will put a note in her chart.  ----- Message ----- From: Danielle Dess Sent: 09/19/2021   9:02 AM EST To: Jacqualine Mau, NP Subject: Bleeding with Plavix                           [name],   Ms. Loveland is schedule with D on Monday for treatment. She started her Plavix on 11/11. She called and said that since she has started taking the Plavix, she has started spotting vaginally and this is not normal for her. She called the  emergency room and they advised her to let Deveshwar know because it may be related to the blood thinner. Please advise.   Thanks,  Lia Foyer

## 2021-09-25 ENCOUNTER — Ambulatory Visit (HOSPITAL_COMMUNITY)
Admission: RE | Admit: 2021-09-25 | Payer: Medicaid Other | Source: Home / Self Care | Admitting: Interventional Radiology

## 2021-09-25 ENCOUNTER — Ambulatory Visit (HOSPITAL_COMMUNITY): Payer: Medicaid Other

## 2021-09-25 ENCOUNTER — Encounter (HOSPITAL_COMMUNITY): Admission: RE | Payer: Self-pay | Source: Home / Self Care

## 2021-09-25 ENCOUNTER — Encounter (HOSPITAL_COMMUNITY): Payer: Self-pay

## 2021-09-25 SURGERY — RADIOLOGY WITH ANESTHESIA
Anesthesia: General

## 2021-10-01 ENCOUNTER — Other Ambulatory Visit: Payer: Self-pay | Admitting: Family Medicine

## 2021-10-01 DIAGNOSIS — R1115 Cyclical vomiting syndrome unrelated to migraine: Secondary | ICD-10-CM

## 2021-10-02 ENCOUNTER — Other Ambulatory Visit: Payer: Self-pay

## 2021-10-02 ENCOUNTER — Encounter: Payer: Self-pay | Admitting: Family Medicine

## 2021-10-02 ENCOUNTER — Ambulatory Visit: Payer: Medicaid Other | Admitting: Family Medicine

## 2021-10-02 VITALS — BP 110/75 | HR 120 | Ht 62.0 in | Wt 107.4 lb

## 2021-10-02 DIAGNOSIS — N939 Abnormal uterine and vaginal bleeding, unspecified: Secondary | ICD-10-CM

## 2021-10-02 DIAGNOSIS — Z23 Encounter for immunization: Secondary | ICD-10-CM

## 2021-10-02 LAB — POCT URINALYSIS DIP (CLINITEK)
Bilirubin, UA: NEGATIVE
Glucose, UA: NEGATIVE mg/dL
Ketones, POC UA: NEGATIVE mg/dL
Nitrite, UA: NEGATIVE
POC PROTEIN,UA: 30 — AB
Spec Grav, UA: 1.015 (ref 1.010–1.025)
Urobilinogen, UA: 1 E.U./dL
pH, UA: 6.5 (ref 5.0–8.0)

## 2021-10-02 LAB — POCT UA - MICROSCOPIC ONLY

## 2021-10-02 NOTE — Patient Instructions (Addendum)
It was nice seeing you today!  I think the bleeding you had was likely due to the medication. I did not see any sources of bleeding on your pelvic exam.  You should be able to continue with surgery as planned.  Stay well, Zola Button, MD [place] [phone]

## 2021-10-02 NOTE — Progress Notes (Signed)
    SUBJECTIVE:   CHIEF COMPLAINT / HPI: vaginal bleeding   Recently diagnosed with cerebral aneurysm, planning for surgery initially 11/21. However, she started having vaginal bleeding shortly after she was started on clopidogrel. She had spotting for 5 days which resolved on 11/19, not enough to saturated a pad. Denies lightheadedness, dizziness. Clopidogrel has been discontinued. Sent here for further evaluation. She had a hysterectomy in 2006 and reports she does not have a uterus or cervix. Denies vaginal discharge. Reports she is not sexually active. Does not think bleeding is coming from urine.  PERTINENT  PMH / PSH: migraines, GERD, cannabis abuse, hysterectomy in 2006  OBJECTIVE:   BP 110/75   Pulse (!) 120   Ht 5' 2"  (1.575 m)   Wt 107 lb 6.4 oz (48.7 kg)   SpO2 99%   BMI 19.64 kg/m   General: alert, NAD GU: Chaperone present.  No external lesions visualized.  Scant amount of white discharge noted.  No active bleeding or ulcerations visualized.  Cervix which is surgically absent not visualized.   ASSESSMENT/PLAN:   Vaginal bleeding Now resolved, suspect secondary to antiplatelet medication.  She is s/p total hysterectomy.  No abnormalities seen on pelvic exam.  No RBCs seen on UA.  Will obtain CBC.   HCM - flu shot given  Zola Button, MD [place]

## 2021-10-03 ENCOUNTER — Encounter: Payer: Self-pay | Admitting: Family Medicine

## 2021-10-03 ENCOUNTER — Telehealth (HOSPITAL_COMMUNITY): Payer: Self-pay

## 2021-10-03 LAB — CBC
Hematocrit: 44.2 % (ref 34.0–46.6)
Hemoglobin: 14.1 g/dL (ref 11.1–15.9)
MCH: 26.6 pg (ref 26.6–33.0)
MCHC: 31.9 g/dL (ref 31.5–35.7)
MCV: 83 fL (ref 79–97)
Platelets: 277 10*3/uL (ref 150–450)
RBC: 5.3 x10E6/uL — ABNORMAL HIGH (ref 3.77–5.28)
RDW: 12.4 % (ref 11.7–15.4)
WBC: 10.8 10*3/uL (ref 3.4–10.8)

## 2021-10-03 NOTE — Telephone Encounter (Signed)
Returned pt's call, no answer, left vm. AW

## 2021-10-09 ENCOUNTER — Telehealth: Payer: Self-pay

## 2021-10-09 NOTE — Telephone Encounter (Signed)
Patient calls nurse line reporting continued AUB. Patient reports a history of a total hysterectomy. Patient reports she stopped bleeding for a few days after her 11/28 office visit, however started bleeding again on 12/1. Patient reports she has been wearing a panty liner using ~3 per day.  Patient scheduled with PCP on 12/15.

## 2021-10-19 ENCOUNTER — Encounter: Payer: Self-pay | Admitting: Family Medicine

## 2021-10-19 ENCOUNTER — Ambulatory Visit (INDEPENDENT_AMBULATORY_CARE_PROVIDER_SITE_OTHER): Payer: Medicaid Other | Admitting: Family Medicine

## 2021-10-19 ENCOUNTER — Other Ambulatory Visit: Payer: Self-pay

## 2021-10-19 VITALS — BP 123/81 | HR 104 | Ht 62.0 in | Wt 108.5 lb

## 2021-10-19 DIAGNOSIS — N939 Abnormal uterine and vaginal bleeding, unspecified: Secondary | ICD-10-CM | POA: Diagnosis not present

## 2021-10-19 NOTE — Progress Notes (Signed)
° °  SUBJECTIVE:   CHIEF COMPLAINT / HPI:    [name] is a 45 y.o. female here for abnormal vaginal bleeding.   Pt reports vaginal bleeding started after clopidogrel.  She was seen at [facility] previously for same and was told to follow up if blood returns. Bleeding returned on 12/5.   States the bleeding stopped after stopping Plavix then returned. Then it started back and stopped again. She is taking Plavix prior to her cerebral vascular surgery next week. She does not believe she will need to continue taking this medication.  Pt reports she is sure she is having bleeding from her vagina and not her rectum.  She had a total hysterectomy in 2006 due to abnormal vaginal bleeding. Denies hematuria, melena and hematochezia.    PERTINENT  PMH / PSH: reviewed and updated as appropriate   OBJECTIVE:   BP 123/81    Pulse (!) 104    Ht 5' 2"  (1.575 m)    Wt 108 lb 8 oz (49.2 kg)    SpO2 100%    BMI 19.84 kg/m    GEN: well appearing female in no acute distress  CVS: well perfused , tachycardic  RESP: speaking in full sentences without pause, no respiratory distress  GU: deferred    ASSESSMENT/PLAN:    Vaginal Bleeding  Likely due to Plavix use but it is very unusual that she has vaginal bleeding s/p total hysterectomy.  Reviewed Dr. Lynnda Child note. No blood in vaginal vault.  Advised pt to use tampon instead of pad to ensure blood is internal and not external to vagina.  Will need to further investigate further she sees blood with tampon use.  Deferred exam today as she is not bleeding. Pt to follow up after her cerebral aneurysm clipping next week.  Scheduled for 11/13/20.  ED precautions provided.     Allison Hensen, DO PGY-3, [facility] 10/20/2021

## 2021-10-19 NOTE — Patient Instructions (Signed)
Follow up on Janurary 9th at 9:30 AM.  Be sure to use a tampon if your bleeding starts again.

## 2021-10-20 ENCOUNTER — Other Ambulatory Visit (HOSPITAL_COMMUNITY): Payer: Self-pay | Admitting: Radiology

## 2021-10-20 ENCOUNTER — Other Ambulatory Visit (HOSPITAL_COMMUNITY)
Admission: RE | Admit: 2021-10-20 | Discharge: 2021-10-20 | Disposition: A | Discharge status: Home / Self Care | Payer: Medicaid Other | Source: Ambulatory Visit | Attending: Interventional Radiology | Admitting: Interventional Radiology

## 2021-10-20 DIAGNOSIS — I671 Cerebral aneurysm, nonruptured: Secondary | ICD-10-CM | POA: Diagnosis not present

## 2021-10-23 ENCOUNTER — Other Ambulatory Visit: Payer: Self-pay | Admitting: Rheumatology

## 2021-10-24 ENCOUNTER — Encounter (HOSPITAL_COMMUNITY): Payer: Self-pay | Admitting: Physician Assistant

## 2021-10-24 ENCOUNTER — Other Ambulatory Visit: Payer: Self-pay

## 2021-10-24 ENCOUNTER — Encounter (HOSPITAL_COMMUNITY): Payer: Self-pay | Admitting: Interventional Radiology

## 2021-10-24 NOTE — Progress Notes (Addendum)
Ms Allison Leonard denies chest pain or shortness of breath. Patient denies having any s/s of Covid in her household.  Patient denies any known exposure to Covid. Ms Allison Leonard reports that she gets short of breath walking in the home. Patient describes it as getting winded and tired.  Ms Allison Leonard states that she usually be doing something else besides walking, "like washing clothes," patient reports that heart rate is usually increased. Ms Allison Leonard states she mentioned it to her PCP, Dr, Lyndee Hensen. I do not see it mentioned in office visit notes and no testing  was done.Ms Allison Leonard did not seem short of breath or 'winded' during the interview. I called IR, I asked to speak to a PA-C, the receptionist  informed  Dr Estanislado Pandy.  Dr. Arlean Hopping  reportedly said, to let the anesthesiology dept, she will be evaluated day of procedure.  I asked PA-C  for anesthesia to review.to review.   Ms Allison Leonard has had some "vaginal bleeding", "spotting" patient was seen by PCP for this.  Ms Allison Leonard had a hysterectomy in 2006.  Dr.Brimage asked patient to wear a tampon to see if bleeding if from vagina or else where. Dr. Susa Simmonds notes also said that it will need to be evaluated after aneurmsym is treated.  I instructed patient to shower with antibiotic soap, if it is available.  Dry off with a clean towel. Do not put lotion, powder, cologne or deodorant or makeup.No jewelry or piercings. Men may shave their face and neck. Woman should not shave. No nail polish, artificial or acrylic nails. Wear clean clothes, brush your teeth. Glasses, contact lens,dentures or partials may not be worn in the OR. If you need to wear them, please bring a case for glasses, do not wear contacts or bring a case, the hospital does not have contact cases, dentures or partials will have to be removed , make sure they are clean, we will provide a denture cup to put them in. You will need some one to drive you home and a responsible person  over the age of 103 to stay with you for the first 24 hours after surgery.

## 2021-10-25 ENCOUNTER — Observation Stay (HOSPITAL_COMMUNITY): Admission: RE | Admit: 2021-10-25 | Payer: Medicaid Other | Source: Ambulatory Visit

## 2021-10-25 ENCOUNTER — Other Ambulatory Visit: Payer: Self-pay | Admitting: Student

## 2021-10-25 ENCOUNTER — Other Ambulatory Visit: Payer: Self-pay | Admitting: Radiology

## 2021-10-26 ENCOUNTER — Encounter (HOSPITAL_COMMUNITY)
Admission: RE | Disposition: A | Discharge status: Home / Self Care | Payer: Self-pay | Source: Home / Self Care | Attending: Interventional Radiology

## 2021-10-26 ENCOUNTER — Encounter (HOSPITAL_COMMUNITY): Payer: Self-pay | Admitting: Interventional Radiology

## 2021-10-26 ENCOUNTER — Other Ambulatory Visit: Payer: Self-pay

## 2021-10-26 ENCOUNTER — Ambulatory Visit (HOSPITAL_COMMUNITY)
Admission: RE | Admit: 2021-10-26 | Discharge: 2021-10-26 | Disposition: A | Discharge status: Home / Self Care | Payer: Medicaid Other | Attending: Interventional Radiology | Admitting: Interventional Radiology

## 2021-10-26 ENCOUNTER — Ambulatory Visit (HOSPITAL_COMMUNITY)
Admission: RE | Admit: 2021-10-26 | Discharge: 2021-10-26 | Disposition: A | Discharge status: Home / Self Care | Payer: Medicaid Other | Source: Ambulatory Visit | Attending: Interventional Radiology | Admitting: Interventional Radiology

## 2021-10-26 DIAGNOSIS — Z538 Procedure and treatment not carried out for other reasons: Secondary | ICD-10-CM | POA: Insufficient documentation

## 2021-10-26 DIAGNOSIS — I729 Aneurysm of unspecified site: Secondary | ICD-10-CM | POA: Diagnosis not present

## 2021-10-26 HISTORY — DX: Dyspnea, unspecified: R06.00

## 2021-10-26 LAB — PLATELET INHIBITION P2Y12

## 2021-10-26 SURGERY — IR WITH ANESTHESIA
Anesthesia: General

## 2021-10-26 MED ORDER — ACETAMINOPHEN 500 MG PO TABS: 1000.0000 mg | ORAL_TABLET | Freq: Once | ORAL | Status: DC

## 2021-10-26 MED ORDER — CEFAZOLIN SODIUM-DEXTROSE 2-4 GM/100ML-% IV SOLN
2.0000 g | INTRAVENOUS | Status: DC
  Filled 2021-10-26: qty 100

## 2021-10-26 MED ORDER — ASPIRIN EC 325 MG PO TBEC: 325.0000 mg | DELAYED_RELEASE_TABLET | ORAL | Status: DC

## 2021-10-26 MED ORDER — CHLORHEXIDINE GLUCONATE 0.12 % MT SOLN
OROMUCOSAL | Status: AC
  Administered 2021-10-26: 10:00:00 15 mL
  Filled 2021-10-26: qty 15

## 2021-10-26 MED ORDER — NIMODIPINE 30 MG PO CAPS: 0.0000 mg | ORAL_CAPSULE | ORAL | Status: DC

## 2021-10-26 MED ORDER — CLOPIDOGREL BISULFATE 75 MG PO TABS: 75.0000 mg | ORAL_TABLET | ORAL | Status: DC

## 2021-10-26 MED ORDER — SODIUM CHLORIDE 0.9 % IV SOLN: INTRAVENOUS | Status: DC

## 2021-10-26 NOTE — Progress Notes (Signed)
Per Dr. Estanislado Pandy, case is being cancelled due to P2Y12 result.

## 2021-10-26 NOTE — Anesthesia Preprocedure Evaluation (Deleted)
Anesthesia Evaluation  Patient identified by MRN, date of birth, ID band Patient awake    Reviewed: Allergy & Precautions, NPO status , Patient's Chart, lab work & pertinent test results  Airway Mallampati: II  TM Distance: >3 FB Neck ROM: Full    Dental no notable dental hx. (+) Dental Advisory Given   Pulmonary neg pulmonary ROS, Current Smoker and Patient abstained from smoking.,    Pulmonary exam normal        Cardiovascular negative cardio ROS Normal cardiovascular exam     Neuro/Psych  Headaches, PSYCHIATRIC DISORDERS Anxiety Cerebral aneurysm     GI/Hepatic Neg liver ROS, GERD  ,  Endo/Other  negative endocrine ROS  Renal/GU negative Renal ROS     Musculoskeletal negative musculoskeletal ROS (+)   Abdominal   Peds  Hematology negative hematology ROS (+)   Anesthesia Other Findings   Reproductive/Obstetrics                            Anesthesia Physical Anesthesia Plan  ASA: 2  Anesthesia Plan: General   Post-op Pain Management: Tylenol PO (pre-op) and Minimal or no pain anticipated   Induction:   PONV Risk Score and Plan: 2 and Ondansetron and Treatment may vary due to age or medical condition  Airway Management Planned: Oral ETT  Additional Equipment:   Intra-op Plan:   Post-operative Plan: Extubation in OR  Informed Consent: I have reviewed the patients History and Physical, chart, labs and discussed the procedure including the risks, benefits and alternatives for the proposed anesthesia with the patient or authorized representative who has indicated his/her understanding and acceptance.     Dental advisory given  Plan Discussed with: Anesthesiologist and CRNA  Anesthesia Plan Comments:        Anesthesia Quick Evaluation

## 2021-10-26 NOTE — Progress Notes (Signed)
Message received from Short Stay RN regarding low P2Y12 result.  Discuss with Dr. Estanislado Pandy, it is too risky to proceed today with P2Y12 of 8.   Dr. Estanislado Pandy informed the patient that the risk of bleeding is too high at this moment, he strongly recommended the procedure to be rescheduled. Patient was instructed to start taking Plavix 37.5 mg instead of 75 mg daily, and she will obtain repeat P2Y12 next Tuesday.  Patient agreed with the plan.   Patient to be discharged home, IR schedulers notified that the patient needs repeat P2Y12 next Tuesday 12/27.   Please call IR for questions and concerns.   Armando Gang Breya Cass PA-C 10/26/2021 10:57 AM

## 2021-10-27 LAB — SARS CORONAVIRUS 2 (TAT 6-24 HRS): SARS Coronavirus 2: NEGATIVE

## 2021-10-27 NOTE — Progress Notes (Signed)
This is not my patient and the orders were placed incorrectly under my name.

## 2021-10-31 ENCOUNTER — Other Ambulatory Visit (HOSPITAL_COMMUNITY)
Admission: RE | Admit: 2021-10-31 | Discharge: 2021-10-31 | Disposition: A | Discharge status: Home / Self Care | Payer: Medicaid Other | Attending: Interventional Radiology | Admitting: Interventional Radiology

## 2021-10-31 ENCOUNTER — Other Ambulatory Visit (HOSPITAL_COMMUNITY): Payer: Self-pay

## 2021-10-31 DIAGNOSIS — I729 Aneurysm of unspecified site: Secondary | ICD-10-CM

## 2021-11-02 ENCOUNTER — Telehealth (HOSPITAL_COMMUNITY): Payer: Self-pay

## 2021-11-02 NOTE — Telephone Encounter (Signed)
Returned pt's call, Allison Leonard is off this week. Will call her next week once he has reviewed her p2y12 results regarding reschedule. Pt agreed. AW

## 2021-11-03 ENCOUNTER — Other Ambulatory Visit: Payer: Self-pay | Admitting: Family Medicine

## 2021-11-03 DIAGNOSIS — R1115 Cyclical vomiting syndrome unrelated to migraine: Secondary | ICD-10-CM

## 2021-11-05 DIAGNOSIS — I729 Aneurysm of unspecified site: Secondary | ICD-10-CM

## 2021-11-05 HISTORY — DX: Aneurysm of unspecified site: I72.9

## 2021-11-08 LAB — PLATELET INHIBITION P2Y12

## 2021-11-13 ENCOUNTER — Ambulatory Visit: Payer: Medicaid Other | Admitting: Family Medicine

## 2021-11-13 NOTE — Progress Notes (Deleted)
° °  SUBJECTIVE:   CHIEF COMPLAINT / HPI:   No chief complaint on file.    Allison Leonard is a 46 y.o. female here for ***   Pt reports ***    PERTINENT  PMH / PSH: reviewed and updated as appropriate   OBJECTIVE:   There were no vitals taken for this visit.  ***  ASSESSMENT/PLAN:   No problem-specific Assessment & Plan notes found for this encounter.     Lyndee Hensen, DO PGY-3, [facility] 11/13/2021      {    This will disappear when note is signed, click to select method of visit    :1}

## 2021-11-23 ENCOUNTER — Other Ambulatory Visit: Payer: Self-pay | Admitting: Radiology

## 2021-11-23 ENCOUNTER — Encounter (HOSPITAL_COMMUNITY): Payer: Self-pay | Admitting: Interventional Radiology

## 2021-11-23 NOTE — Progress Notes (Signed)
DUE TO COVID-19 ONLY ONE VISITOR IS ALLOWED TO COME WITH YOU AND STAY IN THE WAITING ROOM ONLY DURING PRE OP AND PROCEDURE DAY OF SURGERY.   Two VISITORS MAY VISIT WITH YOU AFTER SURGERY IN YOUR PRIVATE ROOM DURING VISITING HOURS ONLY!  PCP -  Dr, Lyndee Hensen Cardiologist - n/a  Chest x-ray - n/a EKG - 10/26/21 Stress Test - n/a ECHO - n/a Cardiac Cath - n/a  ICD Pacemaker/Loop - n/a  Sleep Study -  n/a CPAP - none  Blood Thinner Instructions:  Follow your surgeon's instructions on Plavix prior to surgery.   STOP now taking any Aspirin (unless otherwise instructed by your surgeon), Aleve, Naproxen, Ibuprofen, Motrin, Advil, Goody's, BC's, all herbal medications, fish oil, and all vitamins.   Coronavirus Screening Covid test is scheduled on 11/24/21. Do you have any of the following symptoms:  Cough yes/no: No Fever (>100.33F)  yes/no: No Runny nose yes/no: No Sore throat yes/no: No Difficulty breathing/shortness of breath  yes/no: No  Have you traveled in the last 14 days and where? yes/no: No  Patient verbalized understanding of instructions that were given via phone

## 2021-11-24 ENCOUNTER — Encounter (HOSPITAL_COMMUNITY): Payer: Self-pay | Admitting: Interventional Radiology

## 2021-11-24 ENCOUNTER — Other Ambulatory Visit: Payer: Self-pay

## 2021-11-24 ENCOUNTER — Other Ambulatory Visit (HOSPITAL_COMMUNITY): Payer: Self-pay | Admitting: Physician Assistant

## 2021-11-24 ENCOUNTER — Other Ambulatory Visit (HOSPITAL_COMMUNITY): Payer: Self-pay | Admitting: Interventional Radiology

## 2021-11-24 LAB — SARS CORONAVIRUS 2 (TAT 6-24 HRS): SARS Coronavirus 2: NEGATIVE

## 2021-11-26 NOTE — Anesthesia Preprocedure Evaluation (Addendum)
Anesthesia Evaluation  Patient identified by MRN, date of birth, ID band Patient awake    Reviewed: Allergy & Precautions, NPO status , Patient's Chart, lab work & pertinent test results  Airway Mallampati: II  TM Distance: >3 FB Neck ROM: Full    Dental  (+) Dental Advisory Given, Chipped,    Pulmonary neg pulmonary ROS, Current Smoker and Patient abstained from smoking.,    Pulmonary exam normal breath sounds clear to auscultation       Cardiovascular negative cardio ROS Normal cardiovascular exam Rhythm:Regular Rate:Normal     Neuro/Psych  Headaches, PSYCHIATRIC DISORDERS Anxiety    GI/Hepatic GERD  ,(+)     substance abuse  marijuana use,   Endo/Other  negative endocrine ROS  Renal/GU negative Renal ROS  negative genitourinary   Musculoskeletal negative musculoskeletal ROS (+)   Abdominal   Peds  Hematology  (+) Blood dyscrasia (on plavix), ,   Anesthesia Other Findings   Reproductive/Obstetrics                            Anesthesia Physical Anesthesia Plan  ASA: 2  Anesthesia Plan: General   Post-op Pain Management: Tylenol PO (pre-op)   Induction: Intravenous  PONV Risk Score and Plan: 2 and Midazolam, Dexamethasone and Ondansetron  Airway Management Planned: Oral ETT  Additional Equipment: Arterial line  Intra-op Plan:   Post-operative Plan: Extubation in OR  Informed Consent: I have reviewed the patients History and Physical, chart, labs and discussed the procedure including the risks, benefits and alternatives for the proposed anesthesia with the patient or authorized representative who has indicated his/her understanding and acceptance.     Dental advisory given  Plan Discussed with: CRNA  Anesthesia Plan Comments:         Anesthesia Quick Evaluation

## 2021-11-27 ENCOUNTER — Encounter (HOSPITAL_COMMUNITY): Payer: Self-pay | Admitting: Interventional Radiology

## 2021-11-27 ENCOUNTER — Ambulatory Visit (HOSPITAL_COMMUNITY): Payer: Medicaid Other | Admitting: Anesthesiology

## 2021-11-27 ENCOUNTER — Encounter (HOSPITAL_COMMUNITY)
Admission: RE | Disposition: A | Discharge status: Home / Self Care | Payer: Self-pay | Source: Home / Self Care | Attending: Interventional Radiology

## 2021-11-27 ENCOUNTER — Ambulatory Visit (HOSPITAL_COMMUNITY)
Admission: RE | Admit: 2021-11-27 | Discharge: 2021-11-27 | Disposition: A | Discharge status: Home / Self Care | Payer: Medicaid Other | Source: Ambulatory Visit | Attending: Interventional Radiology | Admitting: Interventional Radiology

## 2021-11-27 ENCOUNTER — Observation Stay (HOSPITAL_COMMUNITY)
Admission: RE | Admit: 2021-11-27 | Discharge: 2021-11-28 | Disposition: A | Discharge status: Home / Self Care | Payer: Medicaid Other | Attending: Interventional Radiology | Admitting: Interventional Radiology

## 2021-11-27 ENCOUNTER — Other Ambulatory Visit: Payer: Self-pay

## 2021-11-27 DIAGNOSIS — Z8371 Family history of colonic polyps: Secondary | ICD-10-CM | POA: Diagnosis not present

## 2021-11-27 DIAGNOSIS — K900 Celiac disease: Secondary | ICD-10-CM | POA: Diagnosis not present

## 2021-11-27 DIAGNOSIS — R112 Nausea with vomiting, unspecified: Secondary | ICD-10-CM | POA: Diagnosis not present

## 2021-11-27 DIAGNOSIS — R000 Tachycardia, unspecified: Secondary | ICD-10-CM | POA: Diagnosis not present

## 2021-11-27 DIAGNOSIS — Z8279 Family history of other congenital malformations, deformations and chromosomal abnormalities: Secondary | ICD-10-CM

## 2021-11-27 DIAGNOSIS — R1115 Cyclical vomiting syndrome unrelated to migraine: Secondary | ICD-10-CM | POA: Diagnosis not present

## 2021-11-27 DIAGNOSIS — F1210 Cannabis abuse, uncomplicated: Secondary | ICD-10-CM | POA: Diagnosis not present

## 2021-11-27 DIAGNOSIS — Z8249 Family history of ischemic heart disease and other diseases of the circulatory system: Secondary | ICD-10-CM | POA: Diagnosis not present

## 2021-11-27 DIAGNOSIS — F17210 Nicotine dependence, cigarettes, uncomplicated: Secondary | ICD-10-CM | POA: Diagnosis not present

## 2021-11-27 DIAGNOSIS — Z79899 Other long term (current) drug therapy: Secondary | ICD-10-CM

## 2021-11-27 DIAGNOSIS — Z7902 Long term (current) use of antithrombotics/antiplatelets: Secondary | ICD-10-CM | POA: Insufficient documentation

## 2021-11-27 DIAGNOSIS — Z8049 Family history of malignant neoplasm of other genital organs: Secondary | ICD-10-CM | POA: Diagnosis not present

## 2021-11-27 DIAGNOSIS — F1290 Cannabis use, unspecified, uncomplicated: Secondary | ICD-10-CM | POA: Diagnosis present

## 2021-11-27 DIAGNOSIS — Z833 Family history of diabetes mellitus: Secondary | ICD-10-CM

## 2021-11-27 DIAGNOSIS — Z800 Family history of malignant neoplasm of digestive organs: Secondary | ICD-10-CM

## 2021-11-27 DIAGNOSIS — K219 Gastro-esophageal reflux disease without esophagitis: Secondary | ICD-10-CM | POA: Diagnosis not present

## 2021-11-27 DIAGNOSIS — Z823 Family history of stroke: Secondary | ICD-10-CM | POA: Diagnosis not present

## 2021-11-27 DIAGNOSIS — Z808 Family history of malignant neoplasm of other organs or systems: Secondary | ICD-10-CM | POA: Diagnosis not present

## 2021-11-27 DIAGNOSIS — F419 Anxiety disorder, unspecified: Secondary | ICD-10-CM | POA: Diagnosis not present

## 2021-11-27 DIAGNOSIS — I720 Aneurysm of carotid artery: Secondary | ICD-10-CM | POA: Diagnosis not present

## 2021-11-27 DIAGNOSIS — G43909 Migraine, unspecified, not intractable, without status migrainosus: Secondary | ICD-10-CM | POA: Diagnosis not present

## 2021-11-27 DIAGNOSIS — Z83438 Family history of other disorder of lipoprotein metabolism and other lipidemia: Secondary | ICD-10-CM

## 2021-11-27 DIAGNOSIS — I671 Cerebral aneurysm, nonruptured: Secondary | ICD-10-CM | POA: Diagnosis not present

## 2021-11-27 DIAGNOSIS — F411 Generalized anxiety disorder: Secondary | ICD-10-CM | POA: Diagnosis present

## 2021-11-27 DIAGNOSIS — Z7982 Long term (current) use of aspirin: Secondary | ICD-10-CM | POA: Insufficient documentation

## 2021-11-27 DIAGNOSIS — I10 Essential (primary) hypertension: Secondary | ICD-10-CM | POA: Diagnosis not present

## 2021-11-27 DIAGNOSIS — F12288 Cannabis dependence with other cannabis-induced disorder: Secondary | ICD-10-CM | POA: Diagnosis not present

## 2021-11-27 HISTORY — PX: IR TRANSCATH/EMBOLIZ: IMG695

## 2021-11-27 HISTORY — PX: RADIOLOGY WITH ANESTHESIA: SHX6223

## 2021-11-27 LAB — CBC WITH DIFFERENTIAL/PLATELET
Abs Immature Granulocytes: 0.04 10*3/uL (ref 0.00–0.07)
Basophils Absolute: 0.1 10*3/uL (ref 0.0–0.1)
Basophils Relative: 1 %
Eosinophils Absolute: 0.4 10*3/uL (ref 0.0–0.5)
Eosinophils Relative: 3 %
HCT: 41.7 % (ref 36.0–46.0)
Hemoglobin: 13.3 g/dL (ref 12.0–15.0)
Immature Granulocytes: 0 %
Lymphocytes Relative: 19 %
Lymphs Abs: 2.5 10*3/uL (ref 0.7–4.0)
MCH: 27.1 pg (ref 26.0–34.0)
MCHC: 31.9 g/dL (ref 30.0–36.0)
MCV: 85.1 fL (ref 80.0–100.0)
Monocytes Absolute: 0.8 10*3/uL (ref 0.1–1.0)
Monocytes Relative: 6 %
Neutro Abs: 9.5 10*3/uL — ABNORMAL HIGH (ref 1.7–7.7)
Neutrophils Relative %: 71 %
Platelets: 230 10*3/uL (ref 150–400)
RBC: 4.9 MIL/uL (ref 3.87–5.11)
RDW: 13.9 % (ref 11.5–15.5)
WBC: 13.3 10*3/uL — ABNORMAL HIGH (ref 4.0–10.5)
nRBC: 0 % (ref 0.0–0.2)

## 2021-11-27 LAB — BASIC METABOLIC PANEL
Anion gap: 6 (ref 5–15)
Anion gap: 11 (ref 5–15)
BUN: 14 mg/dL (ref 6–20)
BUN: 10 mg/dL (ref 6–20)
CO2: 26 mmol/L (ref 22–32)
CO2: 19 mmol/L — ABNORMAL LOW (ref 22–32)
Calcium: 9.1 mg/dL (ref 8.9–10.3)
Calcium: 8.3 mg/dL — ABNORMAL LOW (ref 8.9–10.3)
Chloride: 104 mmol/L (ref 98–111)
Chloride: 107 mmol/L (ref 98–111)
Creatinine, Ser: 0.55 mg/dL (ref 0.44–1.00)
Creatinine, Ser: 0.61 mg/dL (ref 0.44–1.00)
GFR, Estimated: >60 mL/min (ref >60)
GFR, Estimated: >60 mL/min (ref >60)
Glucose, Bld: 116 mg/dL — ABNORMAL HIGH (ref 70–99)
Glucose, Bld: 191 mg/dL — ABNORMAL HIGH (ref 70–99)
Potassium: 5 mmol/L (ref 3.5–5.1)
Potassium: 3.1 mmol/L — ABNORMAL LOW (ref 3.5–5.1)
Sodium: 136 mmol/L (ref 135–145)
Sodium: 137 mmol/L (ref 135–145)

## 2021-11-27 LAB — POCT ACTIVATED CLOTTING TIME
Activated Clotting Time: 287 seconds
Activated Clotting Time: 390 seconds
Activated Clotting Time: 395 seconds

## 2021-11-27 LAB — GLUCOSE, CAPILLARY: Glucose-Capillary: 186 mg/dL — ABNORMAL HIGH (ref 70–99)

## 2021-11-27 LAB — PROTIME-INR
INR: 0.9 (ref 0.8–1.2)
Prothrombin Time: 12.6 seconds (ref 11.4–15.2)

## 2021-11-27 LAB — APTT
aPTT: 45 seconds — ABNORMAL HIGH (ref 24–36)
aPTT: 43 seconds — ABNORMAL HIGH (ref 24–36)

## 2021-11-27 LAB — MAGNESIUM: Magnesium: 1.6 mg/dL — ABNORMAL LOW (ref 1.7–2.4)

## 2021-11-27 LAB — MRSA NEXT GEN BY PCR, NASAL: MRSA by PCR Next Gen: NOT DETECTED

## 2021-11-27 LAB — HEPARIN LEVEL (UNFRACTIONATED): Heparin Unfractionated: 0.17 IU/mL — ABNORMAL LOW (ref 0.30–0.70)

## 2021-11-27 IMAGING — XA IR TRANSCATH EMBOLIZATION
3 series · 10 of 24 positions shown · IV contrast (IODINE)
Comparison: Diagnostic catheter arteriogram [DATE].

CLINICAL DATA: Intractable headaches involving the right
supraorbital and temporal regions. Discovery of unruptured right
internal carotid artery paraclinoid aneurysm.

EXAM:
TRANSCATHETER THERAPY EMBOLIZATION
TECHNIQUE: Informed written consent was obtained from the patient after a
thorough discussion of the procedural risks, benefits and
alternatives. All questions were addressed. Maximal Sterile Barrier
Technique was utilized including caps, mask, sterile gowns, sterile
gloves, sterile drape, hand hygiene and skin antiseptic. A timeout
was performed prior to the initiation of the procedure.
INDICATION: Patient with intractable right-sided headaches. Discovery of a right
internal carotid artery paraclinoid aneurysm, confirmed on
diagnostic catheter arteriogram [DATE].

[Series 14: <mpr range>1 · axial · 5.0mm · 0.43mm/px · z∈[-77,+36]mm · 2 of 40 slices shown]
[im 14/40]
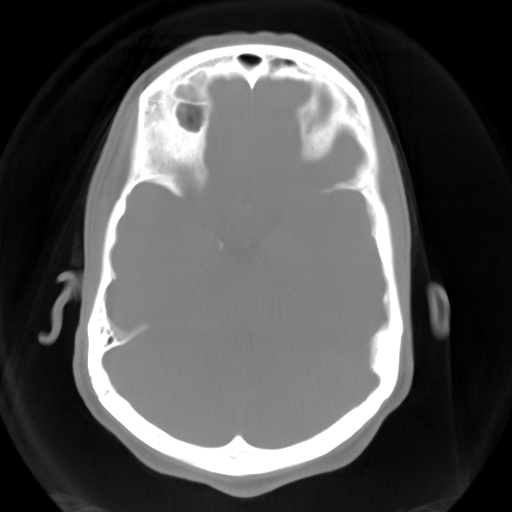
[im 40/40  full-range]
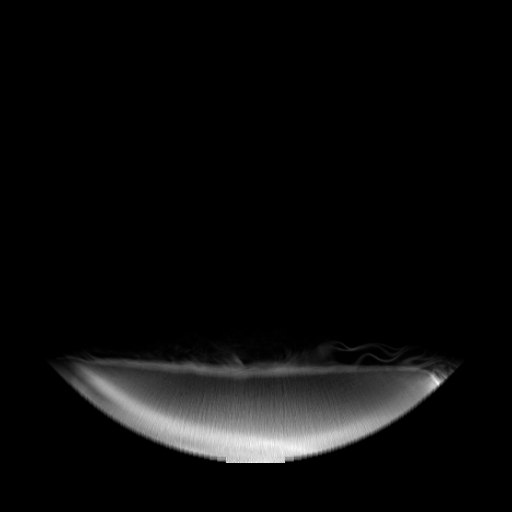

[Series 14: <mpr range>2 · coronal · 5.0mm · 0.47mm/px · 1 of 36 slices shown]
[im 36/36]
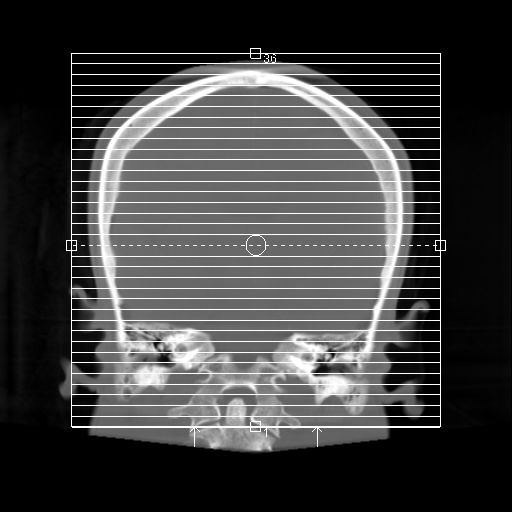

[Series 300: dr. (person_name). · 7 of 171 slices shown]
[im 11/171]
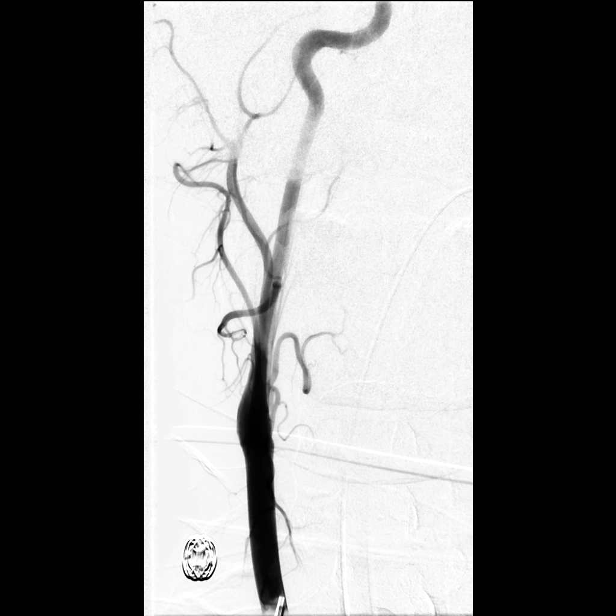
[im 32/171]
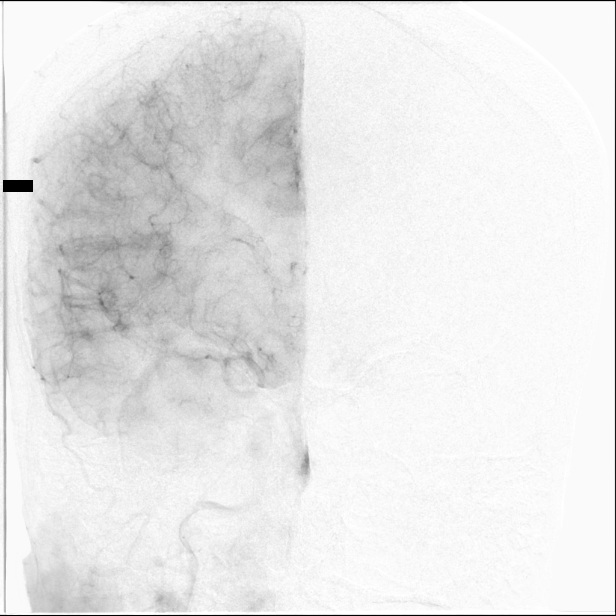
[im 64/171]
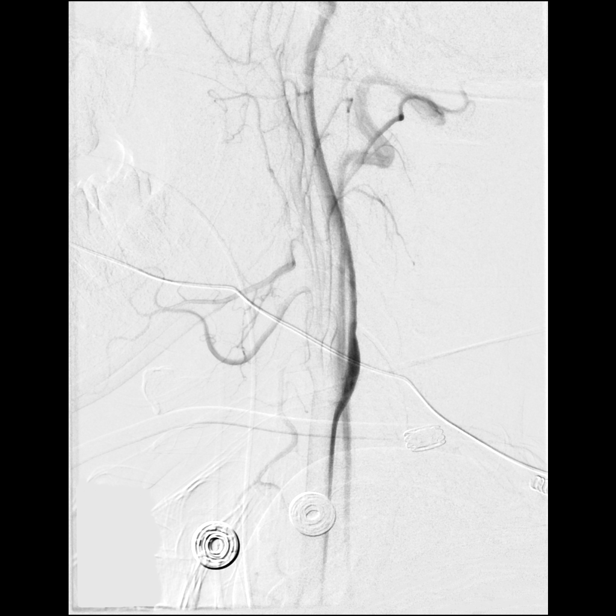
[im 86/171]
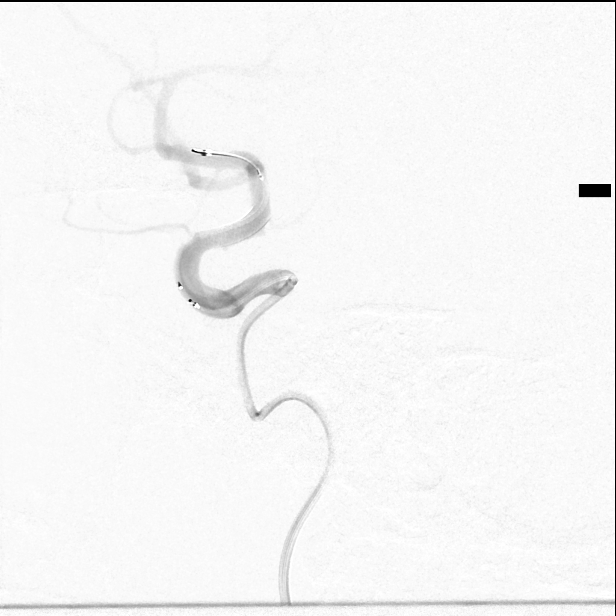
[im 117/171]
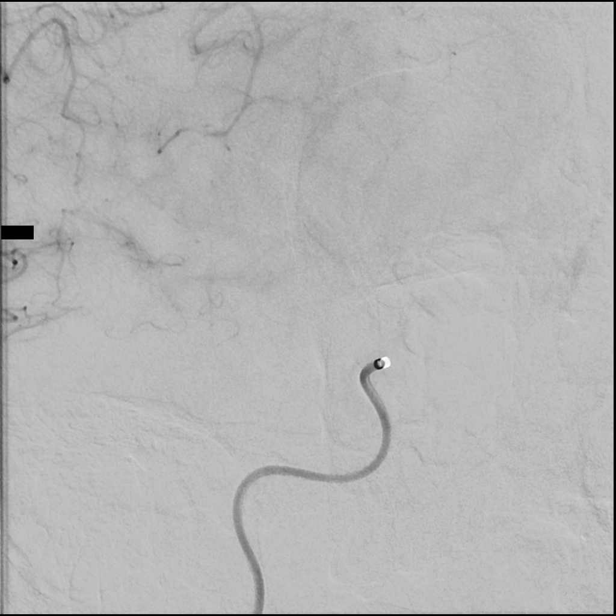
[im 139/171]
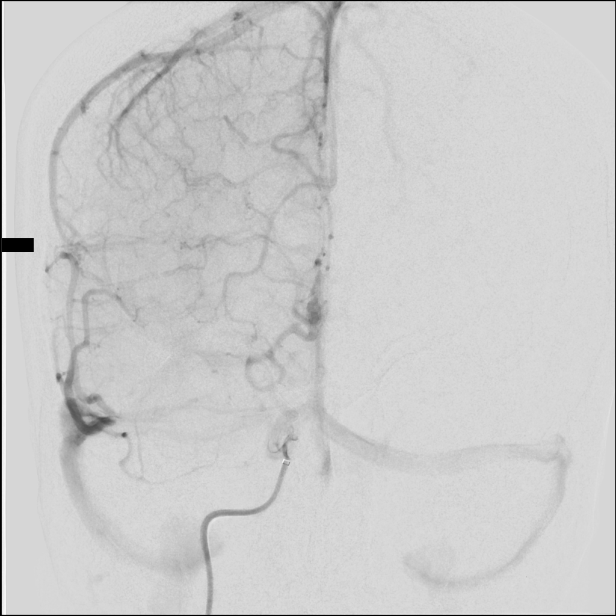
[im 160/171]
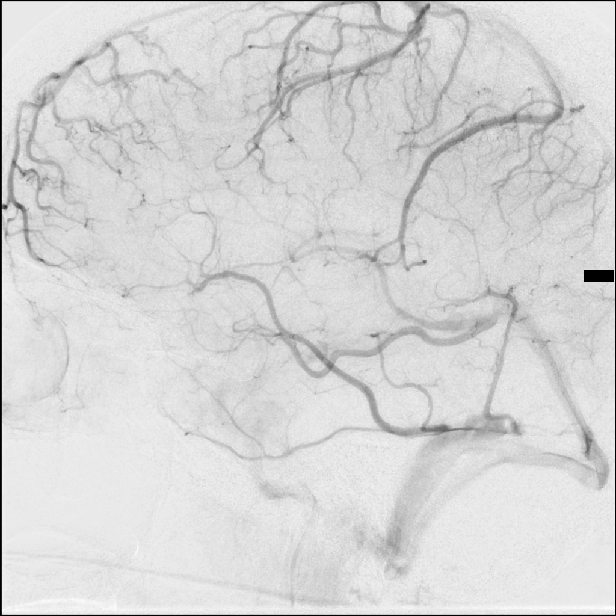

[10 of 24 positions shown; findings below may reference images not displayed]

MEDICATIONS:
Heparin 3,000 units IV. Ancef 2 g IV antibiotic was administered
within 1 hour of the procedure.

ANESTHESIA/SEDATION:
General anesthesia.

CONTRAST:  Omnipaque 300 approximately 75 mL.

FLUOROSCOPY TIME:  Fluoroscopy Time: 16 minutes 54 seconds ([97]
mGy).

COMPLICATIONS:
None immediate.
The right groin was prepped and draped in the usual sterile fashion.
Thereafter using modified Seldinger technique, transfemoral access
into the right common femoral artery was obtained without
difficulty. Over a 0.035 inch guidewire, an 8 French 25 cm Pinnacle
sheath was inserted. Through this, and also over 0.035 inch
guidewire, a 5 French JB 1 catheter was advanced to the aortic arch
region and selectively positioned in the innominate artery, and the
right common carotid artery.
FINDINGS: Innominate arteriogram demonstrates the right subclavian artery and
the common carotid artery proximally to be widely patent.

The origin of the right vertebral artery is widely patent. The
vessel is seen to opacify to the cranial skull base.

The right common carotid arteriogram demonstrates the right external
carotid artery at its origin to be mildly narrowed. Its branches
opacify normally.

The right internal carotid artery at the bulb to the cranial skull
base is widely patent.

The petrous, the cavernous and the supraclinoid segments demonstrate
wide patency.

There is mild fusiform dilatation of petrous, the cavernous and the
proximal cavernous segments of the right internal carotid artery.
The right middle and the right anterior cerebral artery opacify into
the capillary and venous phases.

Again demonstrated is the tubular saccular aneurysm in the right
paraclinoid region arising from the posterior wall of the right
internal carotid artery approximately 2.2 mm x 1.8 mm saccular
aneurysm, also noted on the previous 3D rotational arteriogram
re-formation.

Transient opacification of the right posterior communicating artery
is evident.

ENDOVASCULAR TREATMENT OF RIGHT INTERNAL CAROTID ARTERY PARACLINOID
ANEURYSM WITH PIPELINE FLOW DIVERTER DEVICE

The diagnostic JB 1 catheter in the right common carotid artery was
exchanged over a 0.035 inch 300 cm Rosen exchange guidewire for an 8
French 80 cm Infinity sheath. The guidewire was removed. Good
aspiration was obtained from the Infinity sheath in the distal right
common carotid artery.

A control arteriogram performed through the sheath demonstrated no
evidence of spasms, dissections or intraluminal filling defects.
Over a 0.035 inch Roadrunner guidewire, a 115 cm 5 French Navien
guide catheter was then advanced to the cervical petrous junction.
The guidewire was removed. Good aspiration was obtained from the hub
of the 5 French Navien guide catheter. A gentle control arteriogram
performed through this demonstrated no evidence of spasms or of
intraluminal filling defects. No change was seen in the intracranial
circulation.

Measurements were performed of the distal and the proximal landing
zones of the pipeline construct to treat the paraclinoid aneurysm. A
3 mm x 14 mm pipeline shield flow diverter device was chosen.

Over a 0.014 inch standard Synchro micro guidewire with a moderate J
configuration, a 150 cm 027 microcatheter was advanced to the distal
end of the Navien guide catheter.

With the micro guidewire leading with a J-tip configuration and a
torque device, access was obtained with the micro guidewire followed
by the microcatheter into the M2 segment of the superior division of
the right middle cerebral artery. The guidewire was removed. Good
aspiration obtained of the microcatheter. A gentle control
arteriogram performed through this demonstrated safe positioning of
the tip of the microcatheter. This was then connected to continuous
heparinized saline infusion.

The 3 mm x 14 mm pipeline shield flow diverter device was then
advanced under constant fluoroscopic guidance using biplane DSA
technique to the distal end of the microcatheter.

The O ring on the delivery microcatheter was loosened. The distal
wire, and the distal few mm of the device was then unsheathed.

With further unsheathing, the device was seen to open completely.

The combination of the microcatheter with the device was then
retrieved to the distal landing zone which was in the supraclinoid
right ICA.

Thereafter using advancement of the micro guidewire and fluffing of
the microcatheter to the device in order to encourage full extension
and position with the parent vessel, the device was deployed.
Control arteriogram performed through the 5 French sheath in the
right internal carotid artery demonstrated excellent coverage and
apposition of the device at the placement site. Contrast stagnation
slow flow was seen in the aneurysm itself.

The microcatheter was then advanced through the construct into the
right middle cerebral artery and the wire captured.

The combination was then retrieved and removed. A control
arteriogram performed through the 5 French guide in the right
internal carotid artery continued to demonstrate excellent
apposition of the device at the intended site. Patency of the
posterior communicating artery remained intact as did the anterior
choroidal artery. Control arteriograms were then performed at 15 and
30 minutes post deployment. These continued to demonstrate excellent
flow with apposition of the device with good coverage of the neck of
the aneurysm.

During this time, the patient was also given 3 mg of Integrilin IA
in order to prevent platelet aggregation

Intracranially, no evidence of intraluminal filling defects or of
occlusion was evident.

The 5 French guide was then removed. A final control arteriogram
performed through the Infinity catheter in the right common carotid
artery continued to demonstrate excellent flow through the right
internal carotid artery extra cranially and intracranially. No
change was seen in the right anterior and the right middle cerebral
artery distributions.

The 8 French Pinnacle sheath was then removed with hemostasis
achieved with an 8 French Angio-Seal closure device. Distal pulses
remained palpable in both feet unchanged.

A flat panel CT of the brain on the table demonstrated no evidence
of intracranial hemorrhage.

Patient was extubated without difficulty. Upon recovery, the patient
denied any headaches, nausea, vomiting or of speech or visual
changes. Patient was able to move all fours equally.

She was then transferred to the PACU and then neuro ICU.

Later in the evening, the patient complained of a [DATE] headache on
the right side frontal and parietal regions. No change was seen in
the clinical neurological examination.

This responded to Tylenol p.o., and patient was also prescribed IV
Toradol as needed for moderate severe headaches.

Later, the patient had multiple episodes of nausea and vomiting.
This time the patient's blood pressure remained in the 150-160 range
systolic despite having maximized on IV claveprix.

Patient was in sinus tachycardia at a rate of 130 per minute. CCM
consult was initiated. Patient was evaluated and treated as per CCM
recommendations.

The following morning, the patient felt significantly better
overall.

Her heart rate was in the 80s and 90s sinus rhythm. Blood pressure
was in the appropriate range. Patient had no nausea, vomiting visual
or speech difficulties. She was able to tolerate liquids and semi
solids.

The right groin appeared soft with mild tenderness without any
palpable hematoma. She was then ambulated independently and
discharged home under the care of her mother.

The patient was advised to continue taking her dual antiplatelets,
81 aspirin, and 75 Plavix for 7 days. After that the patient was
advised to continue with 81 aspirin, but to cut down her Plavix to
37.5 mg per day.

Instructions were given to avoid stooping, bending or lifting
weights above 10 pounds for 2 weeks. Patient was advised not to
drive for a couple of weeks as well.

She was advised to maintain adequate hydration and continue with her
home medications as well. Patient was instructed to make an
appointment with her primary care within 7 days also.
IMPRESSION: Status post endovascular treatment of unruptured right internal
carotid artery paraclinoid aneurysm with 3 mm x 14 mm pipeline
shield flow diverter device.

PLAN:
Follow-up in clinic 2 weeks post discharge.

## 2021-11-27 SURGERY — IR WITH ANESTHESIA
Anesthesia: General

## 2021-11-27 MED ORDER — ASPIRIN EC 81 MG PO TBEC
81.0000 mg | DELAYED_RELEASE_TABLET | ORAL | Status: AC
  Filled 2021-11-27: qty 1

## 2021-11-27 MED ORDER — DIPHENHYDRAMINE HCL 50 MG/ML IJ SOLN: 25.0000 mg | Freq: Four times a day (QID) | INTRAMUSCULAR | Status: DC | PRN

## 2021-11-27 MED ORDER — AMISULPRIDE (ANTIEMETIC) 5 MG/2ML IV SOLN
INTRAVENOUS | Status: AC
  Filled 2021-11-27: qty 2

## 2021-11-27 MED ORDER — ASPIRIN EC 325 MG PO TBEC: 325.0000 mg | DELAYED_RELEASE_TABLET | ORAL | Status: DC

## 2021-11-27 MED ORDER — ROCURONIUM BROMIDE 10 MG/ML (PF) SYRINGE
PREFILLED_SYRINGE | INTRAVENOUS | Status: DC | PRN
  Administered 2021-11-27: 50 mg via INTRAVENOUS
  Administered 2021-11-27: 20 mg via INTRAVENOUS

## 2021-11-27 MED ORDER — SODIUM CHLORIDE 0.9 % IV SOLN
12.5000 mg | Freq: Four times a day (QID) | INTRAVENOUS | Status: DC | PRN
  Administered 2021-11-27: 12.5 mg via INTRAVENOUS
  Filled 2021-11-27: qty 12.5

## 2021-11-27 MED ORDER — LIDOCAINE HCL 1 % IJ SOLN
INTRAMUSCULAR | Status: AC
  Filled 2021-11-27: qty 20

## 2021-11-27 MED ORDER — CLOPIDOGREL BISULFATE 75 MG PO TABS: 75.0000 mg | ORAL_TABLET | Freq: Every day | ORAL | Status: DC

## 2021-11-27 MED ORDER — ASPIRIN 81 MG PO CHEW: 81.0000 mg | CHEWABLE_TABLET | Freq: Every day | ORAL | Status: DC

## 2021-11-27 MED ORDER — MAGNESIUM SULFATE 4 GM/100ML IV SOLN
4.0000 g | Freq: Once | INTRAVENOUS | Status: AC
Start: 2021-11-28 — End: 2021-11-28
  Administered 2021-11-28: 01:00:00 4 g via INTRAVENOUS
  Filled 2021-11-27: qty 100

## 2021-11-27 MED ORDER — POTASSIUM CHLORIDE CRYS ER 20 MEQ PO TBCR
20.0000 meq | EXTENDED_RELEASE_TABLET | ORAL | Status: DC
  Administered 2021-11-28: 01:00:00 20 meq via ORAL
  Filled 2021-11-27: qty 1

## 2021-11-27 MED ORDER — ACETAMINOPHEN 160 MG/5ML PO SOLN: 650.0000 mg | ORAL | Status: DC | PRN

## 2021-11-27 MED ORDER — LABETALOL HCL 5 MG/ML IV SOLN
5.0000 mg | INTRAVENOUS | Status: DC | PRN
  Administered 2021-11-27: 5 mg via INTRAVENOUS
  Administered 2021-11-28: 06:00:00 10 mg via INTRAVENOUS
  Filled 2021-11-27 (×2): qty 4

## 2021-11-27 MED ORDER — ACETAMINOPHEN 325 MG PO TABS
650.0000 mg | ORAL_TABLET | ORAL | Status: DC | PRN
  Administered 2021-11-27: 650 mg via ORAL
  Filled 2021-11-27: qty 2

## 2021-11-27 MED ORDER — ACETAMINOPHEN 650 MG RE SUPP: 650.0000 mg | RECTAL | Status: DC | PRN

## 2021-11-27 MED ORDER — CLEVIDIPINE BUTYRATE 0.5 MG/ML IV EMUL
INTRAVENOUS | Status: DC | PRN
Start: 2021-11-27 — End: 2021-11-27
  Administered 2021-11-27: 10 mg/h via INTRAVENOUS

## 2021-11-27 MED ORDER — PHENYLEPHRINE 40 MCG/ML (10ML) SYRINGE FOR IV PUSH (FOR BLOOD PRESSURE SUPPORT)
PREFILLED_SYRINGE | INTRAVENOUS | Status: DC | PRN
  Administered 2021-11-27: 40 ug via INTRAVENOUS

## 2021-11-27 MED ORDER — HEPARIN (PORCINE) 25000 UT/250ML-% IV SOLN
INTRAVENOUS | Status: AC
  Filled 2021-11-27: qty 250

## 2021-11-27 MED ORDER — ASPIRIN EC 81 MG PO TBEC
DELAYED_RELEASE_TABLET | ORAL | Status: AC
  Administered 2021-11-27: 81 mg via ORAL
  Filled 2021-11-27: qty 1

## 2021-11-27 MED ORDER — ASPIRIN 81 MG PO CHEW
81.0000 mg | CHEWABLE_TABLET | Freq: Every day | ORAL | Status: DC
  Administered 2021-11-28: 09:00:00 81 mg via ORAL
  Filled 2021-11-27: qty 1

## 2021-11-27 MED ORDER — VERAPAMIL HCL 2.5 MG/ML IV SOLN
INTRAVENOUS | Status: AC
  Filled 2021-11-27: qty 2

## 2021-11-27 MED ORDER — CLOPIDOGREL BISULFATE 75 MG PO TABS
75.0000 mg | ORAL_TABLET | Freq: Every day | ORAL | Status: DC
  Administered 2021-11-28: 09:00:00 75 mg via ORAL
  Filled 2021-11-27: qty 1

## 2021-11-27 MED ORDER — CLEVIDIPINE BUTYRATE 0.5 MG/ML IV EMUL
0.0000 mg/h | INTRAVENOUS | Status: DC
  Administered 2021-11-27 (×5): 21 mg/h via INTRAVENOUS
  Administered 2021-11-28: 17 mg/h via INTRAVENOUS
  Administered 2021-11-28: 21 mg/h via INTRAVENOUS
  Filled 2021-11-27 (×3): qty 100
  Filled 2021-11-27: qty 200
  Filled 2021-11-27 (×2): qty 100

## 2021-11-27 MED ORDER — PROTAMINE SULFATE 10 MG/ML IV SOLN
INTRAVENOUS | Status: DC | PRN
  Administered 2021-11-27: 15 mg via INTRAVENOUS

## 2021-11-27 MED ORDER — EPTIFIBATIDE 20 MG/10ML IV SOLN
INTRAVENOUS | Status: AC
  Filled 2021-11-27: qty 10

## 2021-11-27 MED ORDER — KETOROLAC TROMETHAMINE 30 MG/ML IJ SOLN
30.0000 mg | Freq: Four times a day (QID) | INTRAMUSCULAR | Status: DC
  Administered 2021-11-27 – 2021-11-28 (×3): 30 mg via INTRAVENOUS
  Filled 2021-11-27 (×3): qty 1

## 2021-11-27 MED ORDER — FENTANYL CITRATE (PF) 100 MCG/2ML IJ SOLN: 25.0000 ug | INTRAMUSCULAR | Status: DC | PRN

## 2021-11-27 MED ORDER — IOHEXOL 300 MG/ML  SOLN
100.0000 mL | Freq: Once | INTRAMUSCULAR | Status: AC | PRN
  Administered 2021-11-27: 50 mL via INTRA_ARTERIAL

## 2021-11-27 MED ORDER — CLEVIDIPINE BUTYRATE 0.5 MG/ML IV EMUL
INTRAVENOUS | Status: AC
  Administered 2021-11-27: 23 mg/h via INTRAVENOUS
  Filled 2021-11-27: qty 50

## 2021-11-27 MED ORDER — NITROGLYCERIN 1 MG/10 ML FOR IR/CATH LAB
INTRA_ARTERIAL | Status: AC
  Filled 2021-11-27: qty 10

## 2021-11-27 MED ORDER — LIDOCAINE 2% (20 MG/ML) 5 ML SYRINGE
INTRAMUSCULAR | Status: DC | PRN
  Administered 2021-11-27: 40 mg via INTRAVENOUS

## 2021-11-27 MED ORDER — ORAL CARE MOUTH RINSE: 15.0000 mL | Freq: Once | OROMUCOSAL | Status: AC

## 2021-11-27 MED ORDER — POTASSIUM CHLORIDE 10 MEQ/100ML IV SOLN
10.0000 meq | INTRAVENOUS | Status: AC
  Administered 2021-11-28 (×2): 10 meq via INTRAVENOUS
  Filled 2021-11-27: qty 100

## 2021-11-27 MED ORDER — HEPARIN SODIUM (PORCINE) 1000 UNIT/ML IJ SOLN
INTRAMUSCULAR | Status: DC | PRN
  Administered 2021-11-27: 3000 [IU] via INTRAVENOUS

## 2021-11-27 MED ORDER — CLOPIDOGREL BISULFATE 75 MG PO TABS
75.0000 mg | ORAL_TABLET | ORAL | Status: AC
  Administered 2021-11-27: 75 mg via ORAL
  Filled 2021-11-27: qty 1

## 2021-11-27 MED ORDER — PROCHLORPERAZINE MALEATE 10 MG PO TABS
10.0000 mg | ORAL_TABLET | Freq: Three times a day (TID) | ORAL | Status: DC | PRN
  Filled 2021-11-27: qty 1

## 2021-11-27 MED ORDER — SODIUM CHLORIDE 0.9 % IV SOLN: INTRAVENOUS | Status: DC

## 2021-11-27 MED ORDER — PROPOFOL 500 MG/50ML IV EMUL
INTRAVENOUS | Status: DC | PRN
  Administered 2021-11-27: 20 ug/kg/min via INTRAVENOUS

## 2021-11-27 MED ORDER — LACTATED RINGERS IV BOLUS
1000.0000 mL | Freq: Once | INTRAVENOUS | Status: AC
  Administered 2021-11-27: 1000 mL via INTRAVENOUS

## 2021-11-27 MED ORDER — HEPARIN SODIUM (PORCINE) 1000 UNIT/ML IJ SOLN
INTRAMUSCULAR | Status: AC
  Filled 2021-11-27: qty 10

## 2021-11-27 MED ORDER — DEXAMETHASONE SODIUM PHOSPHATE 10 MG/ML IJ SOLN
INTRAMUSCULAR | Status: DC | PRN
  Administered 2021-11-27: 5 mg via INTRAVENOUS

## 2021-11-27 MED ORDER — CHLORHEXIDINE GLUCONATE 0.12 % MT SOLN: 15.0000 mL | Freq: Once | OROMUCOSAL | Status: AC

## 2021-11-27 MED ORDER — EPTIFIBATIDE 20 MG/10ML IV SOLN
INTRAVENOUS | Status: DC | PRN
  Administered 2021-11-27 (×2): 1.5 mg via INTRA_ARTERIAL

## 2021-11-27 MED ORDER — FAMOTIDINE IN NACL 20-0.9 MG/50ML-% IV SOLN
20.0000 mg | Freq: Two times a day (BID) | INTRAVENOUS | Status: DC
  Administered 2021-11-28: 01:00:00 20 mg via INTRAVENOUS
  Filled 2021-11-27 (×3): qty 50

## 2021-11-27 MED ORDER — NIMODIPINE 30 MG PO CAPS
0.0000 mg | ORAL_CAPSULE | ORAL | Status: AC
  Administered 2021-11-27: 30 mg via ORAL
  Filled 2021-11-27: qty 1

## 2021-11-27 MED ORDER — FENTANYL CITRATE (PF) 100 MCG/2ML IJ SOLN
INTRAMUSCULAR | Status: AC
  Filled 2021-11-27: qty 2

## 2021-11-27 MED ORDER — FENTANYL CITRATE PF 50 MCG/ML IJ SOSY
25.0000 ug | PREFILLED_SYRINGE | Freq: Once | INTRAMUSCULAR | Status: AC
  Administered 2021-11-27: 25 ug via INTRAVENOUS
  Filled 2021-11-27: qty 1

## 2021-11-27 MED ORDER — SUGAMMADEX SODIUM 200 MG/2ML IV SOLN
INTRAVENOUS | Status: DC | PRN
  Administered 2021-11-27: 100 mg via INTRAVENOUS

## 2021-11-27 MED ORDER — NORTRIPTYLINE HCL 25 MG PO CAPS
25.0000 mg | ORAL_CAPSULE | Freq: Every day | ORAL | Status: DC
  Administered 2021-11-28: 01:00:00 25 mg via ORAL
  Filled 2021-11-27 (×2): qty 1

## 2021-11-27 MED ORDER — AMISULPRIDE (ANTIEMETIC) 5 MG/2ML IV SOLN
10.0000 mg | Freq: Once | INTRAVENOUS | Status: AC
  Administered 2021-11-27: 10 mg via INTRAVENOUS

## 2021-11-27 MED ORDER — IOHEXOL 300 MG/ML  SOLN
100.0000 mL | Freq: Once | INTRAMUSCULAR | Status: AC | PRN
  Administered 2021-11-27: 20 mL via INTRA_ARTERIAL

## 2021-11-27 MED ORDER — LABETALOL HCL 5 MG/ML IV SOLN: 5.0000 mg | Freq: Once | INTRAVENOUS | Status: DC

## 2021-11-27 MED ORDER — ACETAMINOPHEN 500 MG PO TABS
1000.0000 mg | ORAL_TABLET | Freq: Once | ORAL | Status: AC
  Administered 2021-11-27: 1000 mg via ORAL
  Filled 2021-11-27: qty 2

## 2021-11-27 MED ORDER — HEPARIN (PORCINE) 25000 UT/250ML-% IV SOLN
500.0000 [IU]/h | INTRAVENOUS | Status: DC
  Administered 2021-11-27: 500 [IU]/h via INTRAVENOUS
  Filled 2021-11-27: qty 250

## 2021-11-27 MED ORDER — CEFAZOLIN SODIUM-DEXTROSE 2-4 GM/100ML-% IV SOLN
2.0000 g | INTRAVENOUS | Status: AC
  Administered 2021-11-27: 2 g via INTRAVENOUS
  Filled 2021-11-27: qty 100

## 2021-11-27 MED ORDER — CLEVIDIPINE BUTYRATE 0.5 MG/ML IV EMUL
INTRAVENOUS | Status: AC
  Administered 2021-11-27: 21 mg/h via INTRAVENOUS
  Filled 2021-11-27: qty 50

## 2021-11-27 MED ORDER — CHLORHEXIDINE GLUCONATE 0.12 % MT SOLN
OROMUCOSAL | Status: AC
  Administered 2021-11-27: 15 mL via OROMUCOSAL
  Filled 2021-11-27: qty 15

## 2021-11-27 MED ORDER — PROPOFOL 10 MG/ML IV BOLUS
INTRAVENOUS | Status: DC | PRN
  Administered 2021-11-27: 15 mg via INTRAVENOUS
  Administered 2021-11-27 (×2): 20 mg via INTRAVENOUS
  Administered 2021-11-27: 100 mg via INTRAVENOUS
  Administered 2021-11-27: 10 mg via INTRAVENOUS

## 2021-11-27 MED ORDER — AMISULPRIDE (ANTIEMETIC) 5 MG/2ML IV SOLN
INTRAVENOUS | Status: AC
  Administered 2021-11-27: 10 mg
  Filled 2021-11-27: qty 2

## 2021-11-27 MED ORDER — PLECANATIDE 3 MG PO TABS
3.0000 mg | ORAL_TABLET | Freq: Every day | ORAL | Status: DC
Start: 2021-11-27 — End: 2021-11-28

## 2021-11-27 MED ORDER — FAMOTIDINE 20 MG PO TABS: 20.0000 mg | ORAL_TABLET | Freq: Two times a day (BID) | ORAL | Status: DC

## 2021-11-27 MED ORDER — FENTANYL CITRATE (PF) 250 MCG/5ML IJ SOLN
INTRAMUSCULAR | Status: DC | PRN
  Administered 2021-11-27: 100 ug via INTRAVENOUS

## 2021-11-27 MED ORDER — HEPARIN (PORCINE) 25000 UT/250ML-% IV SOLN
500.0000 [IU]/h | INTRAVENOUS | Status: DC
  Administered 2021-11-27: 500 [IU]/h via INTRAVENOUS

## 2021-11-27 NOTE — Progress Notes (Signed)
Deveshwar, MD paged regarding persistent nausea and vomiting approx 667m of clear fluid with blood clots. RN also notified MD for uncontrolled persistent tachycardia and hypertension despite being maxed on Cleviprex gtt.   The following verbal orders were obtained: - Phenergan IV q6 prn nausea if unable to take PO - stop heparin gtt now and obtain STAT aPTT - labetalol 551mIV once, give after nausea and vomiting has subsided per MD

## 2021-11-27 NOTE — Sedation Documentation (Signed)
Pt arrived to [place]. Pt is alert and oriented x4, consent signed, pt is NPO. Foley education done, pt verbalized understanding.

## 2021-11-27 NOTE — Progress Notes (Addendum)
46 y.o. female outpatient.  History of migraines. Diagnostic arteriogram  performed on 10.28.22 revealed the presence of a 2 mm x 1.8 mm right ICA paraclinoid aneurysm. 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 S/p  right ICA aneurysm embolization Dr. Luanne Bras. Per procedure notes. S/P embolization of RT ICA paraclinoid aneurysm with x1 20m x 14 mm Pipeline shield flow diverter device. Post C brain No ICH.  Patient seen in at bedside post internvention today with Dr. DEstanislado Pandy   Patient reporting a headache that she describes as dull located to behind her right eye with nausea.  And right sided throat pain. Pain is rated 5/10. Denies  n/v, chest pain, dyspnea, vision changes, speech difficulties.    Neuro exam unchanged from pre-procedure, no deficits noted. Right CFA puncture site clean, dry, dressed appropriately, no active bleeding, soft, appropriately tender to palpation.   Plan: - Reverse trendelenburg until headache subsides the 30 degrees is right groin site stable, foley to remain overnight - May have sips of water and then advance diet to full liquids if right CFA puncture site stable as ordered - Heparin gtt (managed by pharmacy) - ASA 81 mg  and Plavix 75 mg QD starting tomorrow 1000 - NIR will assess patient in AM for potential discharge   Please call Dr. DEstanislado Pandywith overnight concerns.

## 2021-11-27 NOTE — Progress Notes (Signed)
Received pt to 4N23 from PACU. VS obtained, VSS on Cleviprex @ 50m/hr. Pt has 2x PIV and L radial A line, all intact. Pt A&Ox4. mNIHSS 0. Vascular site check complete, no edema or bruising noted. +2 pulses in RLE. Pt denies pain at this time.

## 2021-11-27 NOTE — Transfer of Care (Signed)
Immediate Anesthesia Transfer of Care Note  Patient: Dannette S Torain  Procedure(s) Performed: EMBLOIZATION  Patient Location: PACU  Anesthesia Type:General  Level of Consciousness: drowsy  Airway & Oxygen Therapy: Patient Spontanous Breathing and Patient connected to nasal cannula oxygen  Post-op Assessment: Report given to RN and Post -op Vital signs reviewed and stable  Post vital signs: Reviewed and stable  Last Vitals:  Vitals Value Taken Time  BP 113/70 11/27/21 1149  Temp    Pulse 101 11/27/21 1152  Resp 33 11/27/21 1152  SpO2 100 % 11/27/21 1152  Vitals shown include unvalidated device data.  Last Pain:  Vitals:   11/27/21 0644  TempSrc:   PainSc: 4       Patients Stated Pain Goal: 0 (98/24/29 9806)  Complications: No notable events documented.

## 2021-11-27 NOTE — Sedation Documentation (Signed)
12F angioseal to right groin site, level 0, quick clot in place

## 2021-11-27 NOTE — Progress Notes (Signed)
RN spoke with Dr. Estanislado Pandy about starting heparin gtt with pt having n/v during the day. Ordered to start heparin gtt, and stop it at 0200 on 11/28/20.

## 2021-11-27 NOTE — Anesthesia Postprocedure Evaluation (Signed)
Anesthesia Post Note  Patient: Allison Leonard  Procedure(s) Performed: EMBLOIZATION     Patient location during evaluation: PACU Anesthesia Type: General Level of consciousness: awake and alert Pain management: pain level controlled Vital Signs Assessment: post-procedure vital signs reviewed and stable Respiratory status: spontaneous breathing, nonlabored ventilation, respiratory function stable and patient connected to nasal cannula oxygen Cardiovascular status: blood pressure returned to baseline and stable Postop Assessment: no apparent nausea or vomiting Anesthetic complications: no   No notable events documented.  Last Vitals:  Vitals:   11/27/21 1220 11/27/21 1235  BP:    Pulse: (!) 109 (!) 125  Resp: 18 (!) 21  Temp:    SpO2: 100% 100%    Last Pain:  Vitals:   11/27/21 1205  TempSrc:   PainSc: 0-No pain                 Aspen Deterding L Carlyne Keehan

## 2021-11-27 NOTE — Progress Notes (Signed)
ANTICOAGULATION CONSULT NOTE  Pharmacy Consult:  Heparin Indication: Post interventional neuroradiology procedure  Allergies  Allergen Reactions   Zofran [Ondansetron Hcl] Anaphylaxis   Morphine And Related Itching    Patient Measurements: Height: 5' 2"  (157.5 cm) Weight: 49.9 kg (110 lb) IBW/kg (Calculated) : 50.1 Heparin Dosing Weight: 50 kg  Vital Signs: Temp: 98.7 F (37.1 C) (01/23 1600) Temp Source: Oral (01/23 1600) BP: 136/62 (01/23 1800) Pulse Rate: 132 (01/23 1830)  Labs: Recent Labs    11/27/21 0556 11/27/21 1741  HGB 13.3  --   HCT 41.7  --   PLT 230  --   APTT  --  45*  LABPROT 12.6  --   INR 0.9  --   HEPARINUNFRC  --  0.17*  CREATININE 0.55  --      Estimated Creatinine Clearance: 70 mL/min (by C-G formula based on SCr of 0.55 mg/dL).   Medical History: Past Medical History:  Diagnosis Date   Acid reflux 07/04/2018   Anemia    Brain aneurysm 2022   Celiac disease/sprue    Cyclic vomiting syndrome    Dyspnea    in past per patient 11/24/21   Generalized anxiety disorder 07/04/2018   History of colon polyps    last colonoscopy 2018 removed multiple benign polyps   Migraines 07/04/2018     Assessment: 86 YOF with brain aneurysm s/p embolization of RT ICA paraclinoid aneurysm on 1/23. Pharmacy consulted for heparin dosing. Baseline labs and home meds reviewed.  PM update - heparin level resulted therapeutic at 0.17, drawn ~45 minutes early. CBC wnl. No bleed issues reported.  Goal of Therapy:  Heparin level 0.1-0.25 units/ml Monitor platelets by anticoagulation protocol: Yes   Plan:  Continue heparin infusion at 500 units/hr - stop 1/24 AM at 0800 per protocol  Monitor CBC, s/sx bleeding   Arturo Morton, PharmD, BCPS Please check AMION for all [facility] contact numbers Clinical Pharmacist 11/27/2021 7:32 PM

## 2021-11-27 NOTE — Progress Notes (Signed)
ANTICOAGULATION CONSULT NOTE - Initial Consult  Pharmacy Consult:  Heparin Indication: Post interventional neuroradiology procedure  Allergies  Allergen Reactions   Zofran [Ondansetron Hcl] Anaphylaxis   Morphine And Related Itching    Patient Measurements: Height: 5' 2"  (157.5 cm) Weight: 49.9 kg (110 lb) IBW/kg (Calculated) : 50.1 Heparin Dosing Weight: 50 kg  Vital Signs: Temp: 97.9 F (36.6 C) (01/23 1135) Temp Source: Oral (01/23 0600) BP: 117/68 (01/23 1205) Pulse Rate: 125 (01/23 1235)  Labs: Recent Labs    11/27/21 0556  HGB 13.3  HCT 41.7  PLT 230  LABPROT 12.6  INR 0.9  CREATININE 0.55    Estimated Creatinine Clearance: 70 mL/min (by C-G formula based on SCr of 0.55 mg/dL).   Medical History: Past Medical History:  Diagnosis Date   Acid reflux 07/04/2018   Anemia    Brain aneurysm 2022   Celiac disease/sprue    Cyclic vomiting syndrome    Dyspnea    in past per patient 11/24/21   Generalized anxiety disorder 07/04/2018   History of colon polyps    last colonoscopy 2018 removed multiple benign polyps   Migraines 07/04/2018     Assessment: 61 YOF with brain aneurysm s/p embolization of RT ICA paraclinoid aneurysm on 1/23.  Pharmacy consulted for heparin dosing.  Baseline labs and home meds reviewed.  Goal of Therapy:  Heparin level 0.1-0.25 units/ml Monitor platelets by anticoagulation protocol: Yes   Plan:  Continue heparin infusion at 500 units/hr as ordered - stop in AM per protocol  Check 6 hr heparin level.  Repeat level if needed.  Faruq Rosenberger D. Mina Marble, PharmD, BCPS, [place] 11/27/2021, 1:17 PM

## 2021-11-27 NOTE — Progress Notes (Signed)
°  Transition of Care [facility]) Screening Note   Patient Details  Name: Allison Leonard Date of Birth: August 30, 1976   Transition of Care [facility]) CM/SW Contact:    Benard Halsted, LCSW Phone Number: 11/27/2021, 5:37 PM    Transition of Care Department [facility]) has reviewed patient and no TOC needs have been identified at this time. We will continue to monitor patient advancement through interdisciplinary progression rounds. If new patient transition needs arise, please place a TOC consult.

## 2021-11-27 NOTE — Procedures (Signed)
S/P RT common carotid arteriogram RT CFA approach  Findings . 1S/P embolization of RT ICA paraclinoid aneurysm with x1  ,52m x 14 mm Pipeline shield flow diverter device. Post C T brain No ICH. 58F angioseal for Rt CFA hemostasis. Extubated  Denies any H/As,N/V or visual changes. Follows instructions appropriately.. Pupils 2 mm RT = LT sluggish. No facial asymmetry. Tongue midline. Moves all 4s equally.  RT groin soft. Distal pulses intact. S.Kensi Karr MD

## 2021-11-27 NOTE — Progress Notes (Signed)
eLink Physician-Brief Progress Note Patient Name: Allison Leonard DOB: Aug 22, 1976 MRN: [id number]   Date of Service  11/27/2021  HPI/Events of Note  K+ 3.1, Mg++ 1.6, Cr 0.61.  eICU Interventions  KCL and Mg++ sulfate repletion ordered per E-Link Electrolyte Replacement Protocol.        Allison Leonard 11/27/2021, 11:00 PM

## 2021-11-27 NOTE — H&P (Signed)
Chief Complaint: Patient was seen in consultation today for Cerebral arteriogram with Right internal carotid artery aneurysm embolization at the request of Dr Liborio Nixon   Supervising Physician: Luanne Bras  Patient Status: [facility] - Out-pt  History of Present Illness: Allison Leonard is a 46 y.o. female   Hx headaches; migrainous headaches Dr Tomi Likens sent pt to Dr Estanislado Pandy for evaluation of abnormal MRI revealing outpouching suspicious of aneurysms in the right intra cavernous, the right posterior cerebral artery P1 segment, and in the basilar artery proximally projecting left laterally inferiorly.  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  Reconsulted with Dr Estanislado Pandy 09/06/21:  Management considerations were those of continued close surveillance with regular interval neuro imaging studies. These may entail MRA or CT angiogram and possibly a diagnostic catheter arteriogram. This is to evaluate for any changes in the aneurysm size or configuration of both. The other option was elimination of the aneurysm from the circulation which would mean no blood entering aneurysm and thereby securing it and eliminating the risk of growth and/or rupture. Endovascular option discussed was that of coiling versus stent assisted coiling versus use of flow diverter technology.     After careful consideration, the patient opted for endovascular treatment.  Scheduled now for today Pt is taking Plavix 37.5 and ASA 81 mg daily  Pre procedure meds: Plavix 75 mg and ASA 81 mg now per Dr Estanislado Pandy     Past Medical History:  Diagnosis Date   Acid reflux 07/04/2018   Anemia    Brain aneurysm 2022   Celiac disease/sprue    Cyclic vomiting syndrome    Dyspnea    in past per patient 11/24/21    Generalized anxiety disorder 07/04/2018   History of colon polyps    last colonoscopy 2018 removed multiple benign polyps   Migraines 07/04/2018    Past Surgical History:  Procedure Laterality Date   ABDOMINAL HYSTERECTOMY  2006   APPENDECTOMY  1999   COLONOSCOPY  2018   ESOPHAGOGASTRODUODENOSCOPY  2018   IR 3D INDEPENDENT WKST  09/01/2021   IR ANGIO INTRA EXTRACRAN SEL COM CAROTID INNOMINATE BILAT MOD SED  09/01/2021   IR ANGIO VERTEBRAL SEL VERTEBRAL BILAT MOD SED  09/01/2021   IR RADIOLOGIST EVAL & MGMT  08/21/2021   IR RADIOLOGIST EVAL & MGMT  09/07/2021    Allergies: Zofran [ondansetron hcl] and Morphine and related  Medications: Prior to Admission medications   Medication Sig Start Date End Date Taking? Authorizing Provider  clopidogrel (PLAVIX) 75 MG tablet Take 1 tablet (75 mg total) by mouth daily. Patient taking differently: Take 37.5 mg by mouth daily. 09/08/21  Yes Tyson Alias, NP  famotidine (PEPCID) 20 MG tablet Take 1 tablet (20 mg total) by mouth 2 (two) times daily. 09/08/21  Yes Thornton Park, MD  nortriptyline (PAMELOR) 25 MG capsule Take 1 capsule (25 mg total) by mouth at bedtime. 06/27/21  Yes Jaffe, Adam R, DO  Plecanatide (TRULANCE) 3 MG TABS Take 3 mg by mouth daily. 09/08/21  Yes Thornton Park, MD  prochlorperazine (COMPAZINE) 10 MG tablet TAKE 1 TABLET BY MOUTH EVERY 8 HOURS AS NEEDED FOR NAUSEA AND VOMITING 11/03/21  Yes Brimage, Vondra, DO  Rimegepant Sulfate (NURTEC) 75 MG TBDP Take 75 mg by mouth as needed (Take 1 tab at the earlist onset of Migraine.  max 1 tab in 24 hours). 08/30/21  Yes Jaffe, Adam R, DO  dicyclomine (BENTYL) 20 MG tablet Take 1 tablet (20 mg total) by mouth 3 (three) times daily as needed for spasms. Patient not taking: Reported on 11/21/2021 09/08/21   Thornton Park, MD     Family History  Problem Relation Age of Onset   Diabetes Father    Hypertension Father    Hyperlipidemia Father    Colon polyps Father    Heart  attack Maternal Grandmother    Heart attack Paternal Grandmother    Cervical cancer Maternal Aunt    Stomach cancer Maternal Aunt    Throat cancer Maternal Uncle    Stroke Maternal Uncle    Down syndrome Son    Colon cancer Sister    Esophageal cancer Neg Hx    Rectal cancer Neg Hx     Social History   Socioeconomic History   Marital status: Single    Spouse name: Not on file   Number of children: 3   Years of education: Not on file   Highest education level: Not on file  Occupational History   Not on file  Tobacco Use   Smoking status: Every Day    Packs/day: 0.50    Years: 10.00    Pack years: 5.00    Types: Cigarettes   Smokeless tobacco: Never  Vaping Use   Vaping Use: Never used  Substance and Sexual Activity   Alcohol use: Not Currently   Drug use: Yes    Frequency: 1.0 times per week    Types: Marijuana    Comment: Last use 11/18/21   Sexual activity: Not on file    Comment: Hysterectomy  Other Topics Concern   Not on file  Social History Narrative   Right handed   [employer]   Drinks coffee occasionally   Social Determinants of Health   Financial Resource Strain: Not on file  Food Insecurity: Not on file  Transportation Needs: Not on file  Physical Activity: Not on file  Stress: Not on file  Social Connections: Not on file    Review of Systems: A 12 point ROS discussed and pertinent positives are indicated in the HPI above.  All other systems are negative.  Review of Systems  Constitutional:  Negative for activity change, appetite change, fatigue and fever.  HENT:  Negative for tinnitus, trouble swallowing and voice change.   Eyes:  Negative for visual disturbance.  Respiratory:  Negative for cough and shortness of breath.   Cardiovascular:  Negative for chest pain.  Gastrointestinal:  Negative for abdominal pain, nausea and vomiting.  Musculoskeletal:  Negative for back pain and gait problem.  Neurological:  Positive for headaches. Negative  for dizziness, tremors, seizures, syncope, facial asymmetry, speech difficulty, weakness, light-headedness and numbness.  Psychiatric/Behavioral:  Negative for behavioral problems and confusion.    Vital Signs: BP 139/87    Pulse 99    Temp 98.9 F (37.2 C) (Oral)    Resp 18    Ht 5' 2"  (1.575 m)    Wt 110 lb (49.9 kg)    SpO2 100%    BMI 20.12 kg/m   Physical Exam Vitals reviewed.  HENT:     Mouth/Throat:     Mouth: Mucous membranes are moist.  Eyes:     Extraocular Movements: Extraocular movements intact.  Cardiovascular:     Rate and Rhythm: Normal rate and regular rhythm.     Heart sounds: Normal heart sounds.  Pulmonary:     Effort: Pulmonary effort is normal.     Breath sounds: Normal breath sounds. No wheezing.  Abdominal:     Palpations: Abdomen is soft.     Tenderness: There is no abdominal tenderness.  Musculoskeletal:        General: Normal range of motion.     Right lower leg: No edema.     Left lower leg: No edema.  Skin:    General: Skin is warm.  Neurological:     Mental Status: She is alert and oriented to person, place, and time.  Psychiatric:        Mood and Affect: Mood normal.        Behavior: Behavior normal.        Thought Content: Thought content normal.        Judgment: Judgment normal.    Imaging: No results found.  Labs:  CBC: Recent Labs    03/10/21 1414 09/01/21 1000 10/02/21 1440  WBC 9.0 10.4 10.8  HGB 13.6 12.6 14.1  HCT 41.7 39.0 44.2  PLT 222 252 277    COAGS: Recent Labs    09/01/21 1000  INR 0.9    BMP: Recent Labs    03/10/21 1414 09/01/21 1000 09/01/21 1105  NA 142 134*  --   K 4.8 7.3* 4.5  CL 100 100  --   CO2 26 28  --   GLUCOSE 148* 110*  --   BUN 12 13  --   CALCIUM 10.1 9.1  --   CREATININE 0.68 0.55  --   GFRNONAA  --  >60  --     LIVER FUNCTION TESTS: Recent Labs    03/10/21 1414  BILITOT 0.2  AST 17  ALT 12  ALKPHOS 78  PROT 7.8  ALBUMIN 5.0*    TUMOR MARKERS: No results for  input(s): AFPTM, CEA, CA199, CHROMGRNA in the last 8760 hours.  Assessment and Plan:  Scheduled today for Right internal carotid artery aneurysm embolization Risks and benefits of cerebral angiogram with intervention were discussed with the patient including, but not limited to bleeding, infection, vascular injury, contrast induced renal failure, stroke or even death.  This interventional procedure involves the use of X-rays and because of the nature of the planned procedure, it is possible that we will have prolonged use of X-ray fluoroscopy.  Potential radiation risks to you include (but are not limited to) the following: - A slightly elevated risk for cancer  several years later in life. This risk is typically less than 0.5% percent. This risk is low in comparison to the normal incidence of human cancer, which is 33% for women and 50% for men according to the [place]. - Radiation induced injury can include skin redness, resembling a rash, tissue breakdown / ulcers and hair loss (which can be temporary or permanent).   The likelihood of either of these occurring depends on the difficulty of the procedure and whether you are sensitive to radiation due to previous procedures, disease, or genetic conditions.   IF your procedure requires a prolonged use of radiation, you will be notified and given written instructions for further action.  It is your responsibility to monitor the irradiated area for the 2 weeks following the procedure and to notify your physician if you are concerned that you have suffered a radiation induced injury.    All of the patient's questions were answered, patient is agreeable to proceed.  Consent signed and in chart.  Pt is aware if intervention is performed, she will be admitted overnight into Neuro ICU  She is agreeable to proceed  Thank you for this interesting consult.  I greatly enjoyed meeting Allison Leonard and look forward to  participating in their care.  A copy of this report was sent to the requesting provider on this date.  Electronically Signed: Lavonia Drafts, PA-C 11/27/2021, 6:48 AM   I spent a total of  30 Minutes   in face to face in clinical consultation, greater than 50% of which was counseling/coordinating care for R ICA aneurysm embolization

## 2021-11-27 NOTE — Sedation Documentation (Signed)
Pt intubated and sedated at this time for procedure, CRNA present for case

## 2021-11-27 NOTE — Consult Note (Addendum)
NAME:  Allison Leonard, MRN:  [id number], DOB:  [birth year], LOS: 0 ADMISSION DATE:  11/27/2021, CONSULTATION DATE:  11/27/2021 REFERRING MD:  Dr. Estanislado Pandy, CHIEF COMPLAINT:  N/V, tachycardia   History of Present Illness:  46 year old female with prior history of anxiety, celiac disease, migraine, and cyclic vomiting with cannabinoid hyperemesis syndrome who was admitted to [facility] for elective endovascular repair of right ICA aneurysm.   Patient prior underwent MRI/MRA for worsening migraine symptoms and multiple areas suspicious for aneurysms.  Underwent cerebral angiogram 09/01/2021 which showed approx 19m x 1.8 mm RT ICA paraclinoid aneurysm and basilar artery fenestration.    Patient underwent endovascular repair of right ICA paraclinoid aneurysm with flow diverter device.  Post procedure CTH negative for ICH.  She was extubated and returned to ICU on cleviprex for strict SBP control however has had ongoing vomiting despite amisulpride, promethazine, and compazine and now is tachycardic.  PCCM consulted to help with medical management.   Pertinent  Medical History  GAD, celiac disease/ sprue, cyclic vomiting syndrome with cannabinoid hyperemesis syndrome, migraine, chronic constipation, colon polyps  Significant Hospital Events: Including procedures, antibiotic start and stop dates in addition to other pertinent events   1/23 endovascular repair of R ICA aneurysm with flow diverter, extubated, on cleviprex, ongoing N/V   Interim History / Subjective:  > 1`L emesis   HTN and tachycardic, on max cleviprex   Objective   Blood pressure 131/68, pulse (!) 125, temperature 98.7 F (37.1 C), temperature source Oral, resp. rate (!) 23, height 5' 2"  (1.575 m), weight 49.9 kg, SpO2 98 %.        Intake/Output Summary (Last 24 hours) at 11/27/2021 1738 Last data filed at 11/27/2021 1500 Gross per 24 hour  Intake 219.1 ml  Output 950 ml  Net -730.9 ml   Filed Weights   11/24/21 0921  11/27/21 0600  Weight: 49.9 kg 49.9 kg    Examination: General: uncomfortable and ill appearing wdwn adult F in bed HENT: NCAT. Dried emesis on face. Trachea midline Lungs: even unlabored. Symmetrical chest expansion. Clear  Cardiovascular: tachycardic, regular. S1s2. 2+ peripheral pulse Abdomen: soft nd  Extremities: no acute joint deformity. R radial art line. No cyanosis or clubbing  Neuro: AAOx4 following commands  GU: defer  Resolved Hospital Problem list     Assessment & Plan:   Right internal carotid artery aneurysm, s/p embolization, pipeline shield flow diverter device  P -per NIR -SBP 120-140  -- cleviprex for goal  -PRN labetalol 180XK   Cyclic Vomiting syndrome  Migraine Hx Cannabinoid hyperemesis syndrome  -Possible PONV -- has hx of hyperemesis / cyclical vomiting, but onset also after case -- cant r/o component of PONV  -some overlap in migraine + CVS -takes phenergan, compazine, pepcid, pamelor -zofran allergy  -has had  > 1L emesis 1/23 afternoon  -think current emesis episode is likely driving her tachycardia, HTN.  P -STAT CBG -1L LR now, unless CBG low then will add dextrose containing fluid  -adding q6hr benadryl  -cont compazine (can make PR if unable to tolerate PO), phenergan.  -starting home nortriptyline (helpful for both migraine and CVS) -if above not helpful, can consider topical capsaicin cream to abdomen (thought to help activate TRPV1 receptors which are down-regulated in cannabinoid hyperemesis syndrome) -Haldol may also be helpful --  at 0.05 or 0.1 mg/kg (for cannabinoid hyperemesis -- in trials has better efficacy than zofran, to which she is allergic)  -lights off, quiet-- try to  make a calm, migraine friendly environment    HTN P -cleviprex as above for goal 120-140 -PRN labetalol  as above    Celiac  -GF diet   Best Practice (right click and "Reselect all SmartList Selections" daily)   Diet/type: NPO w/ oral meds DVT  prophylaxis: systemic heparin GI prophylaxis: N/A Lines: Arterial Line Foley:  N/A Code Status:  full code Last date of multidisciplinary goals of care discussion [per primary]  Labs   CBC: Recent Labs  Lab 11/27/21 0556  WBC 13.3*  NEUTROABS 9.5*  HGB 13.3  HCT 41.7  MCV 85.1  PLT 366    Basic Metabolic Panel: Recent Labs  Lab 11/27/21 0556  NA 136  K 5.0  CL 104  CO2 26  GLUCOSE 116*  BUN 14  CREATININE 0.55  CALCIUM 9.1   GFR: Estimated Creatinine Clearance: 70 mL/min (by C-G formula based on SCr of 0.55 mg/dL). Recent Labs  Lab 11/27/21 0556  WBC 13.3*    Liver Function Tests: No results for input(s): AST, ALT, ALKPHOS, BILITOT, PROT, ALBUMIN in the last 168 hours. No results for input(s): LIPASE, AMYLASE in the last 168 hours. No results for input(s): AMMONIA in the last 168 hours.  ABG    Component Value Date/Time   PHART 7.232 (L) 11/19/2018 0950   PCO2ART 28.5 (L) 11/19/2018 0950   PO2ART 99.4 11/19/2018 0950   HCO3 11.5 (L) 11/19/2018 0950   TCO2 26 09/11/2019 0646   ACIDBASEDEF 14.5 (H) 11/19/2018 0950   O2SAT 96.5 11/19/2018 0950     Coagulation Profile: Recent Labs  Lab 11/27/21 0556  INR 0.9    Cardiac Enzymes: No results for input(s): CKTOTAL, CKMB, CKMBINDEX, TROPONINI in the last 168 hours.  HbA1C: No results found for: HGBA1C  CBG: No results for input(s): GLUCAP in the last 168 hours.  Review of Systems:   Review of Systems  Constitutional: Negative.   HENT: Negative.    Eyes: Negative.   Cardiovascular: Negative.   Gastrointestinal:  Positive for nausea and vomiting.  Genitourinary: Negative.   Musculoskeletal: Negative.   Skin: Negative.   Neurological:  Positive for headaches.  Endo/Heme/Allergies: Negative.   Psychiatric/Behavioral: Negative.      Past Medical History:  She,  has a past medical history of Acid reflux (07/04/2018), Anemia, Brain aneurysm (2022), Celiac disease/sprue, Cyclic vomiting  syndrome, Dyspnea, Generalized anxiety disorder (07/04/2018), History of colon polyps, and Migraines (07/04/2018).   Surgical History:   Past Surgical History:  Procedure Laterality Date   ABDOMINAL HYSTERECTOMY  2006   APPENDECTOMY  1999   COLONOSCOPY  2018   ESOPHAGOGASTRODUODENOSCOPY  2018   IR 3D INDEPENDENT WKST  09/01/2021   IR ANGIO INTRA EXTRACRAN SEL COM CAROTID INNOMINATE BILAT MOD SED  09/01/2021   IR ANGIO VERTEBRAL SEL VERTEBRAL BILAT MOD SED  09/01/2021   IR RADIOLOGIST EVAL & MGMT  08/21/2021   IR RADIOLOGIST EVAL & MGMT  09/07/2021     Social History:   reports that she has been smoking cigarettes. She has a 5.00 pack-year smoking history. She has never used smokeless tobacco. She reports that she does not currently use alcohol. She reports current drug use. Frequency: 1.00 time per week. Drug: Marijuana.   Family History:  Her family history includes Cervical cancer in her maternal aunt; Colon cancer in her sister; Colon polyps in her father; Diabetes in her father; Down syndrome in her son; Heart attack in her maternal grandmother and paternal grandmother; Hyperlipidemia in her father;  Hypertension in her father; Stomach cancer in her maternal aunt; Stroke in her maternal uncle; Throat cancer in her maternal uncle. There is no history of Esophageal cancer or Rectal cancer.   Allergies Allergies  Allergen Reactions   Zofran [Ondansetron Hcl] Anaphylaxis   Morphine And Related Itching     Home Medications  Prior to Admission medications   Medication Sig Start Date End Date Taking? Authorizing Provider  clopidogrel (PLAVIX) 75 MG tablet Take 1 tablet (75 mg total) by mouth daily. Patient taking differently: Take 37.5 mg by mouth daily. 09/08/21  Yes Tyson Alias, NP  famotidine (PEPCID) 20 MG tablet Take 1 tablet (20 mg total) by mouth 2 (two) times daily. 09/08/21  Yes Beavers, Joelene Millin, MD  Plecanatide (TRULANCE) 3 MG TABS Take 3 mg by mouth daily. 09/08/21  Yes  Thornton Park, MD  prochlorperazine (COMPAZINE) 10 MG tablet TAKE 1 TABLET BY MOUTH EVERY 8 HOURS AS NEEDED FOR NAUSEA AND VOMITING 11/03/21  Yes Brimage, Vondra, DO  Rimegepant Sulfate (NURTEC) 75 MG TBDP Take 75 mg by mouth as needed (Take 1 tab at the earlist onset of Migraine. max 1 tab in 24 hours). 08/30/21  Yes Jaffe, Adam R, DO  dicyclomine (BENTYL) 20 MG tablet Take 1 tablet (20 mg total) by mouth 3 (three) times daily as needed for spasms. Patient not taking: Reported on 11/21/2021 09/08/21   Thornton Park, MD  nortriptyline (PAMELOR) 25 MG capsule Take 1 capsule (25 mg total) by mouth at bedtime. 06/27/21   Pieter Partridge, DO     Critical care time: 41 minutes      CRITICAL CARE Performed by: Cristal Generous   Total critical care time: 41 minutes  Critical care time was exclusive of separately billable procedures and treating other patients.  Critical care was necessary to treat or prevent imminent or life-threatening deterioration.  Critical care was time spent personally by me on the following activities: development of treatment plan with patient and/or surrogate as well as nursing, discussions with consultants, evaluation of patient's response to treatment, examination of patient, obtaining history from patient or surrogate, ordering and performing treatments and interventions, ordering and review of laboratory studies, ordering and review of radiographic studies, pulse oximetry and re-evaluation of patient's condition.  Eliseo Gum MSN, AGACNP-BC [place] for pager 11/27/2021, 6:17 PM

## 2021-11-27 NOTE — Anesthesia Procedure Notes (Signed)
Arterial Line Insertion Start/End1/23/2023 8:00 AM, 11/27/2021 8:15 AM Performed by: Imagene Riches, CRNA, CRNA  Patient location: Pre-op. Preanesthetic checklist: patient identified, IV checked, site marked, risks and benefits discussed, surgical consent, monitors and equipment checked, pre-op evaluation, timeout performed and anesthesia consent Left, radial was placed Catheter size: 20 G Hand hygiene performed  and maximum sterile barriers used  Allen's test indicative of satisfactory collateral circulation Attempts: 2 Procedure performed without using ultrasound guided technique. Following insertion, dressing applied and Biopatch. Post procedure assessment: normal  Post procedure complications: local hematoma. Patient tolerated the procedure well with no immediate complications.

## 2021-11-27 NOTE — Anesthesia Procedure Notes (Signed)
Procedure Name: Intubation Date/Time: 11/27/2021 8:53 AM Performed by: Imagene Riches, CRNA Pre-anesthesia Checklist: Patient identified, Emergency Drugs available, Suction available and Patient being monitored Patient Re-evaluated:Patient Re-evaluated prior to induction Oxygen Delivery Method: Circle System Utilized Preoxygenation: Pre-oxygenation with 100% oxygen Induction Type: IV induction Ventilation: Mask ventilation without difficulty Laryngoscope Size: Miller and 2 Grade View: Grade I Tube type: Oral Tube size: 6.5 mm Number of attempts: 1 Airway Equipment and Method: Stylet and Oral airway Placement Confirmation: ETT inserted through vocal cords under direct vision, positive ETCO2 and breath sounds checked- equal and bilateral Secured at: 22 cm Tube secured with: Tape Dental Injury: Teeth and Oropharynx as per pre-operative assessment

## 2021-11-28 ENCOUNTER — Encounter (HOSPITAL_COMMUNITY): Payer: Self-pay | Admitting: Interventional Radiology

## 2021-11-28 DIAGNOSIS — R1115 Cyclical vomiting syndrome unrelated to migraine: Secondary | ICD-10-CM | POA: Diagnosis not present

## 2021-11-28 DIAGNOSIS — G43909 Migraine, unspecified, not intractable, without status migrainosus: Secondary | ICD-10-CM | POA: Diagnosis not present

## 2021-11-28 DIAGNOSIS — Z7982 Long term (current) use of aspirin: Secondary | ICD-10-CM | POA: Diagnosis not present

## 2021-11-28 DIAGNOSIS — F17210 Nicotine dependence, cigarettes, uncomplicated: Secondary | ICD-10-CM | POA: Diagnosis not present

## 2021-11-28 DIAGNOSIS — K900 Celiac disease: Secondary | ICD-10-CM | POA: Diagnosis not present

## 2021-11-28 DIAGNOSIS — F12288 Cannabis dependence with other cannabis-induced disorder: Secondary | ICD-10-CM | POA: Diagnosis not present

## 2021-11-28 DIAGNOSIS — F411 Generalized anxiety disorder: Secondary | ICD-10-CM | POA: Diagnosis not present

## 2021-11-28 DIAGNOSIS — R000 Tachycardia, unspecified: Secondary | ICD-10-CM | POA: Diagnosis not present

## 2021-11-28 DIAGNOSIS — K219 Gastro-esophageal reflux disease without esophagitis: Secondary | ICD-10-CM | POA: Diagnosis not present

## 2021-11-28 DIAGNOSIS — I671 Cerebral aneurysm, nonruptured: Secondary | ICD-10-CM | POA: Diagnosis not present

## 2021-11-28 DIAGNOSIS — I10 Essential (primary) hypertension: Secondary | ICD-10-CM | POA: Diagnosis present

## 2021-11-28 DIAGNOSIS — Z79899 Other long term (current) drug therapy: Secondary | ICD-10-CM | POA: Diagnosis not present

## 2021-11-28 DIAGNOSIS — Z7902 Long term (current) use of antithrombotics/antiplatelets: Secondary | ICD-10-CM | POA: Diagnosis not present

## 2021-11-28 DIAGNOSIS — F419 Anxiety disorder, unspecified: Secondary | ICD-10-CM | POA: Diagnosis not present

## 2021-11-28 LAB — CBC WITH DIFFERENTIAL/PLATELET
Abs Immature Granulocytes: 0.09 10*3/uL — ABNORMAL HIGH (ref 0.00–0.07)
Basophils Absolute: 0 10*3/uL (ref 0.0–0.1)
Basophils Relative: 0 %
Eosinophils Absolute: 0.1 10*3/uL (ref 0.0–0.5)
Eosinophils Relative: 0 %
HCT: 35.8 % — ABNORMAL LOW (ref 36.0–46.0)
Hemoglobin: 11.9 g/dL — ABNORMAL LOW (ref 12.0–15.0)
Immature Granulocytes: 1 %
Lymphocytes Relative: 18 %
Lymphs Abs: 3.4 10*3/uL (ref 0.7–4.0)
MCH: 27.6 pg (ref 26.0–34.0)
MCHC: 33.2 g/dL (ref 30.0–36.0)
MCV: 83.1 fL (ref 80.0–100.0)
Monocytes Absolute: 1.6 10*3/uL — ABNORMAL HIGH (ref 0.1–1.0)
Monocytes Relative: 9 %
Neutro Abs: 13.5 10*3/uL — ABNORMAL HIGH (ref 1.7–7.7)
Neutrophils Relative %: 72 %
Platelets: 210 10*3/uL (ref 150–400)
RBC: 4.31 MIL/uL (ref 3.87–5.11)
RDW: 13.9 % (ref 11.5–15.5)
WBC: 18.7 10*3/uL — ABNORMAL HIGH (ref 4.0–10.5)
nRBC: 0 % (ref 0.0–0.2)

## 2021-11-28 LAB — BASIC METABOLIC PANEL
Anion gap: 9 (ref 5–15)
BUN: <5 mg/dL — ABNORMAL LOW (ref 6–20)
CO2: 20 mmol/L — ABNORMAL LOW (ref 22–32)
Calcium: 7.8 mg/dL — ABNORMAL LOW (ref 8.9–10.3)
Chloride: 105 mmol/L (ref 98–111)
Creatinine, Ser: 0.51 mg/dL (ref 0.44–1.00)
GFR, Estimated: >60 mL/min (ref >60)
Glucose, Bld: 113 mg/dL — ABNORMAL HIGH (ref 70–99)
Potassium: 3.7 mmol/L (ref 3.5–5.1)
Sodium: 134 mmol/L — ABNORMAL LOW (ref 135–145)

## 2021-11-28 LAB — MAGNESIUM: Magnesium: 2.7 mg/dL — ABNORMAL HIGH (ref 1.7–2.4)

## 2021-11-28 MED ORDER — POTASSIUM CHLORIDE CRYS ER 20 MEQ PO TBCR
40.0000 meq | EXTENDED_RELEASE_TABLET | Freq: Once | ORAL | Status: AC
  Administered 2021-11-28: 06:00:00 40 meq via ORAL
  Filled 2021-11-28: qty 2

## 2021-11-28 MED ORDER — AMLODIPINE BESYLATE 10 MG PO TABS
10.0000 mg | ORAL_TABLET | Freq: Every day | ORAL | Status: DC
  Administered 2021-11-28: 09:00:00 10 mg via ORAL
  Filled 2021-11-28: qty 1

## 2021-11-28 MED ORDER — ASPIRIN EC 81 MG PO TBEC: 81.0000 mg | DELAYED_RELEASE_TABLET | Freq: Every day | ORAL | 11 refills | Status: DC

## 2021-11-28 MED ORDER — CHLORHEXIDINE GLUCONATE CLOTH 2 % EX PADS: 6.0000 | MEDICATED_PAD | Freq: Every day | CUTANEOUS | Status: DC

## 2021-11-28 MED ORDER — FAMOTIDINE 20 MG PO TABS
20.0000 mg | ORAL_TABLET | Freq: Two times a day (BID) | ORAL | Status: DC
  Administered 2021-11-28: 09:00:00 20 mg via ORAL
  Filled 2021-11-28: qty 1

## 2021-11-28 MED ORDER — HEPARIN SODIUM (PORCINE) 5000 UNIT/ML IJ SOLN: 5000.0000 [IU] | Freq: Three times a day (TID) | INTRAMUSCULAR | Status: DC

## 2021-11-28 NOTE — Assessment & Plan Note (Signed)
Plan: -  Continue home migraine and nausea medications

## 2021-11-28 NOTE — Assessment & Plan Note (Addendum)
Status post pipeline stenting with no focal neurological deficit. Plan: -Ready for discharge from CCM standpoint. -Continue ASA per neuro IR

## 2021-11-28 NOTE — Progress Notes (Signed)
NAME:  Allison Leonard, MRN:  [id number], DOB:  May 01, 1976, LOS: 1 ADMISSION DATE:  11/27/2021, CONSULTATION DATE:  11/27/2021 REFERRING MD:  Dr. 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   Patient prior underwent MRI/MRA for worsening migraine symptoms and multiple areas suspicious for aneurysms.  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    Patient underwent endovascular repair of right ICA paraclinoid aneurysm with flow diverter device.  Post procedure CTH negative for ICH.  She was extubated and returned to ICU on cleviprex for strict SBP control however has had ongoing vomiting despite amisulpride, promethazine, and compazine and now is tachycardic.  PCCM consulted to help with medical management.   Pertinent  Medical History  GAD, celiac disease/ sprue, cyclic vomiting syndrome with cannabinoid hyperemesis syndrome, migraine, chronic constipation, colon polyps  Significant Hospital Events: Including procedures, antibiotic start and stop dates in addition to other pertinent events   1/23 endovascular repair of R ICA aneurysm with flow diverter, extubated, on cleviprex, ongoing N/V   Interim History / Subjective:  Cleviprex off.  Slightly hypertensive with SBP 150s. States vomiting improved.   Objective   Blood pressure (!) 112/57, pulse 92, temperature 98.8 F (37.1 C), temperature source Oral, resp. rate 16, height 5' 2"  (1.575 m), weight 49.9 kg, SpO2 98 %.        Intake/Output Summary (Last 24 hours) at 11/28/2021 0810 Last data filed at 11/28/2021 0700 Gross per 24 hour  Intake 2512.64 ml  Output 4450 ml  Net -1937.36 ml    Filed  Weights   11/24/21 0921 11/27/21 0600  Weight: 49.9 kg 49.9 kg    Examination: General: Adult female, resting in bed, in NAD. Neuro: A&O x 3, no deficits. HEENT: [place]/AT. Sclerae anicteric. EOMI. Cardiovascular: RRR, no M/R/G.  Lungs: Respirations even and unlabored.  CTA bilaterally, No W/R/R. Abdomen: BS x 4, soft, NT/ND.  Musculoskeletal: No gross deformities, no edema.  Skin: Intact, warm, no rashes.  Resolved Hospital Problem list     Assessment & Plan:   Right internal carotid artery aneurysm, s/p embolization, pipeline shield flow diverter device  P -per NIR   Cyclic Vomiting syndrome  Migraine Hx Cannabinoid hyperemesis syndrome  -Possible PONV -- has hx of hyperemesis / cyclical vomiting, but onset also after case -- cant r/o component of PONV  -some overlap in migraine + CVS -takes phenergan, compazine, pepcid, pamelor -zofran allergy  -had  > 1L emesis 1/23 afternoon  -think current emesis episode is likely driving her tachycardia, HTN.  P -cont benadryl PRN, compazine (can make PR if unable to tolerate PO), phenergan PRN. -cont home nortriptyline (helpful for both migraine and CVS) -if above not helpful, can consider topical capsaicin cream to abdomen (thought to help activate TRPV1 receptors which are down-regulated in cannabinoid hyperemesis syndrome) -Haldol may also be helpful --  at 0.05 or 0.1 mg/kg (for cannabinoid hyperemesis -- in trials has better efficacy than zofran, to which she is allergic)  -lights off, quiet-- try to make a calm, migraine friendly environment    HTN P -Add Norvasc, cont at discharge and have her follow up closely with PCP for titration/addition of other meds -PRN labetalol -d/c arterial line  Celiac  -GF diet   Rest per IR.  Nothing else to add, ok for discharge from our standpoint.  Please let us know if we can be of any further assistance.  Best Practice (right click and "Reselect all SmartList Selections" daily)  Per  NIR.   Montey Hora, [place] [facility] For pager details, please see AMION or use Epic chat  After 1900, please call [facility] for cross coverage needs 11/28/2021, 8:21 AM

## 2021-11-28 NOTE — Assessment & Plan Note (Signed)
Nausea currently well controlled. Plan: -Continue home Compazine on discharge -Avoid cannabis use.

## 2021-11-28 NOTE — Progress Notes (Signed)
eLink Physician-Brief Progress Note Patient Name: Allison Leonard DOB: 1976/04/07 MRN: [id number]   Date of Service  11/28/2021  HPI/Events of Note  Patient is having severe burning and pain at the iv infusion site for her KCL boluses.  eICU Interventions  KCL infusion discontinued with 20 meq yet to infuse. An extra dose of 20 meq of KCL ordered  to be given with her scheduled 4 AM 20 meq dose .        Frederik Pear 11/28/2021, 4:43 AM

## 2021-11-28 NOTE — Assessment & Plan Note (Signed)
Currently headache free. Plan: -Continue home Nurtec as needed.

## 2021-11-28 NOTE — Assessment & Plan Note (Signed)
Systolic blood pressures in the 150s. Plan: -Will start on amlodipine 10 mg daily

## 2021-11-28 NOTE — Subjective & Objective (Addendum)
CCM Attending Progress Note  Patient Details Name: Allison Leonard MRN: [id number] DOB: 27-Jun-1976  LOS: 18  46 year old woman who underwent elective pipeline stenting of right ICA aneurysm.  Postoperative nausea yesterday much improved  On examination: She is somnolent but in no distress and responds appropriately.  Chest clear to auscultation bilaterally heart sounds are unremarkable neurological examination is nonfocal.  Abdomen is soft and nontender.  She is hypertensive.  Assessment and Plan Cardiovascular and Mediastinum Migraines Assessment & Plan Currently headache free. Plan: -Continue home Nurtec as needed.  Hypertension Assessment & Plan Systolic blood pressures in the 150s. Plan: -Will start on amlodipine 10 mg daily  * Brain aneurysm Assessment & Plan Status post pipeline stenting with no focal neurological deficit. Plan: -Ready for discharge from CCM standpoint. -Continue ASA per neuro IR  Digestive Cyclic vomiting syndrome Assessment & Plan Plan: -  Continue home migraine and nausea medications   Cannabinoid hyperemesis syndrome Assessment & Plan Nausea currently well controlled. Plan: -Continue home Compazine on discharge -Avoid cannabis use.  Other Cannabis abuse Assessment & Plan Plan: - Abstinence advised to avoid emesis.   Additional comments:I reviewed the patient's new clinical lab test results.   and Patient is ready for discharge  Kipp Brood, MD [facility] ICU Physician [place]  Pager: [phone] Or Epic Secure Chat After hours: [phone].  11/28/2021, 9:35 AM

## 2021-11-28 NOTE — Assessment & Plan Note (Signed)
Plan: - Abstinence advised to avoid emesis.

## 2021-11-28 NOTE — Discharge Summary (Signed)
Patient ID: NAKEISHA GREENHOUSE MRN: [id number] DOB/AGE: 46-Dec-1977 46 y.o.  Admit date: 11/27/2021 Discharge date: 11/28/2021  Supervising Physician: Luanne Bras  Patient Status: [facility] - In-pt  Admission Diagnoses: Right internal carotid artery aneurysm   Discharge Diagnoses:  Principal Problem:   Brain aneurysm Active Problems:   GERD (gastroesophageal reflux disease)   Migraines   Cannabinoid hyperemesis syndrome   Cyclic vomiting syndrome   Discharged Condition: good  Hospital Course:   Patient presented to the [facility] department for elective treatment of a right internal carotid artery aneurysm. She underwent embolization with a pipeline shield flow diverter device. She was extubated post-procedure and transferred to the ICU for planned overnight observation. Her stay was complicated by uncontrolled hypertension and nausea/vomiting. She has a history of cyclic vomiting syndrome. Critical care team was consulted for assistance with medical management overnight. She was given supportive care including IV fluids, antihypertensives and antiemetics.   This morning the patient denies pain, discomfort, headache, nausea of vomiting. She is eating her breakfast and states she is ready to go home. Right groin vascular site is soft, clean and dry. Arterial line and foley catheters will be removed this morning. Patient ok for discharge once she has voided and ambulated.   She will follow up with Dr. Estanislado Pandy in two weeks. She was advised to take 75 mg Plavix and 81 mg aspirin daily for one week. After one week she can reduce plavix back to her prior dose of 37.5 mg plavix and she will still take 81 mg aspirin daily. She was advised to follow up with her PCP in one week. She knows she can call our office with any questions/concerns prior to her next visit.   Consults: pulmonary/intensive care  Significant Diagnostic Studies: No results  found.  Treatments:   Discharge Exam: Blood pressure 115/78, pulse 100, temperature 98.8 F (37.1 C), temperature source Oral, resp. rate 16, height 5' 2"  (1.575 m), weight 110 lb (49.9 kg), SpO2 98 %. Physical Exam Constitutional:      General: She is not in acute distress.    Appearance: She is not ill-appearing.  HENT:     Mouth/Throat:     Mouth: Mucous membranes are moist.     Pharynx: Oropharynx is clear.  Cardiovascular:     Comments: Right groin vascular site is clean, soft and dry. Mild tenderness to palpation. Left radial a-line intact. Site is clean and dry.  Pulmonary:     Effort: Pulmonary effort is normal.  Neurological:     Mental Status: She is alert and oriented to person, place, and time.     Cranial Nerves: No dysarthria or facial asymmetry.     Motor: Motor function is intact.    Disposition: Discharge disposition: 01-Home or Self Care        Allergies as of 11/28/2021       Reactions   Zofran [ondansetron Hcl] Anaphylaxis   Morphine And Related Itching        Medication List     TAKE these medications    aspirin EC 81 MG tablet Take 1 tablet (81 mg total) by mouth daily. Swallow whole.   clopidogrel 75 MG tablet Commonly known as: Plavix Take 1 tablet (75 mg total) by mouth daily. What changed: how much to take Notes to patient: Take 75 mg daily for one week. After one week resume prior dose of 37.5 mg daily.    dicyclomine 20 MG tablet Commonly known as:  BENTYL Take 1 tablet (20 mg total) by mouth 3 (three) times daily as needed for spasms.   famotidine 20 MG tablet Commonly known as: PEPCID Take 1 tablet (20 mg total) by mouth 2 (two) times daily.   nortriptyline 25 MG capsule Commonly known as: PAMELOR Take 1 capsule (25 mg total) by mouth at bedtime.   Nurtec 75 MG Tbdp Generic drug: Rimegepant Sulfate Take 75 mg by mouth as needed (Take 1 tab at the earlist onset of Migraine. max 1 tab in 24 hours).   prochlorperazine 10  MG tablet Commonly known as: COMPAZINE TAKE 1 TABLET BY MOUTH EVERY 8 HOURS AS NEEDED FOR NAUSEA AND VOMITING   Trulance 3 MG Tabs Generic drug: Plecanatide Take 3 mg by mouth daily.        Follow-up Information     [place] Follow up.   Why: Two week follow up with Dr. Estanislado Pandy. A scheduler from our office will call you with a date/time of your appointment. Please call our office with any questions prior to your visit. Contact information: [place] [address] [phone]                  Electronically Signed: Theresa Duty, NP 11/28/2021, 9:15 AM   I have spent Less Than 30 Minutes discharging [place].

## 2021-11-28 NOTE — Progress Notes (Signed)
Subjective/Objective CCM Attending Progress Note  Patient Details Name: Allison Leonard MRN: [id number] DOB: [birth year]  LOS: 59  46 year old woman who underwent elective pipeline stenting of right ICA aneurysm.  Postoperative nausea yesterday much improved  On examination: She is somnolent but in no distress and responds appropriately.  Chest clear to auscultation bilaterally heart sounds are unremarkable neurological examination is nonfocal.  Abdomen is soft and nontender.  She is hypertensive.  Assessment and Plan Cardiovascular and Mediastinum Migraines Assessment & Plan Currently headache free. Plan: -Continue home Nurtec as needed.  Hypertension Assessment & Plan Systolic blood pressures in the 150s. Plan: -Will start on amlodipine 10 mg daily  * Brain aneurysm Assessment & Plan Status post pipeline stenting with no focal neurological deficit. Plan: -Ready for discharge from CCM standpoint. -Continue ASA per neuro IR  Digestive Cyclic vomiting syndrome Assessment & Plan Plan: -  Continue home migraine and nausea medications   Cannabinoid hyperemesis syndrome Assessment & Plan Nausea currently well controlled. Plan: -Continue home Compazine on discharge -Avoid cannabis use.  Other Cannabis abuse Assessment & Plan Plan: - Abstinence advised to avoid emesis.   Additional comments:I reviewed the patient's new clinical lab test results.   and Patient is ready for discharge  Kipp Brood, MD [facility] ICU Physician [place]  Pager: [phone] Or Epic Secure Chat After hours: [phone].  11/28/2021, 9:35 AM       Scheduled Meds:  amLODipine  10 mg Oral Daily   aspirin  81 mg Oral Daily   Or   aspirin  81 mg Per Tube Daily   Chlorhexidine Gluconate Cloth  6 each Topical Daily   clopidogrel  75 mg Oral Daily   Or   clopidogrel  75 mg Per Tube Daily   famotidine  20 mg Oral BID   heparin injection  (subcutaneous)  5,000 Units Subcutaneous Q8H   ketorolac  30 mg Intravenous Q6H   nortriptyline  25 mg Oral QHS   Plecanatide  3 mg Oral Daily   Continuous Infusions:  sodium chloride 100 mL/hr at 11/28/21 0900   promethazine (PHENERGAN) injection (IM or IVPB) Stopped (11/27/21 1825)   PRN Meds:acetaminophen **OR** acetaminophen (TYLENOL) oral liquid 160 mg/5 mL **OR** acetaminophen, diphenhydrAMINE, labetalol, prochlorperazine, promethazine (PHENERGAN) injection (IM or IVPB)  Vital signs in last 24 hours: Temp:  [97.7 F (36.5 C)-99 F (37.2 C)] 98.8 F (37.1 C) (01/24 0800) Pulse Rate:  [90-162] 100 (01/24 0900) Resp:  [14-35] 16 (01/24 0900) BP: (102-142)/(57-81) 115/78 (01/24 0900) SpO2:  [93 %-100 %] 98 % (01/24 0900) Arterial Line BP: (120-172)/(50-82) 140/66 (01/24 0800)  Intake/Output last 3 shifts: I/O last 3 completed shifts: In: 2512.6 [I.V.:2082.3; IV Piggyback:430.3] Out: 9518 [Urine:3275; Emesis/NG output:1150; Blood:25] Intake/Output this shift: Total I/O In: 212.9 [I.V.:212.9] Out: 600 [Urine:600]  Problem Assessment/Plan Cardiovascular and Mediastinum Migraines Assessment & Plan Currently headache free. Plan: -Continue home Nurtec as needed.  Hypertension Assessment & Plan Systolic blood pressures in the 150s. Plan: -Will start on amlodipine 10 mg daily  * Brain aneurysm Assessment & Plan Status post pipeline stenting with no focal neurological deficit. Plan: -Ready for discharge from CCM standpoint. -Continue ASA per neuro IR  Digestive Cyclic vomiting syndrome Assessment & Plan Plan: -  Continue home migraine and nausea medications   Cannabinoid hyperemesis syndrome Assessment & Plan Nausea currently well controlled. Plan: -Continue home Compazine on discharge -Avoid cannabis use.  Other Cannabis abuse Assessment & Plan Plan: - Abstinence advised to avoid emesis.  Einar Grad  Lynetta Mare, MD [facility] ICU Physician [place]  Pager: [phone] Or Epic Secure Chat After hours: [phone].  11/28/2021, 9:52 AM

## 2021-11-28 NOTE — Discharge Instructions (Addendum)
Please follow up with your primary care provider in one week. No bending, stooping, driving or lifting greater than 10 pounds for two weeks.

## 2021-11-29 ENCOUNTER — Emergency Department (HOSPITAL_COMMUNITY)
Admission: EM | Admit: 2021-11-29 | Discharge: 2021-11-29 | Disposition: A | Discharge status: Home / Self Care | Payer: Medicaid Other | Attending: Student | Admitting: Student

## 2021-11-29 ENCOUNTER — Telehealth: Payer: Self-pay

## 2021-11-29 ENCOUNTER — Emergency Department (HOSPITAL_COMMUNITY): Payer: Medicaid Other

## 2021-11-29 DIAGNOSIS — Z7902 Long term (current) use of antithrombotics/antiplatelets: Secondary | ICD-10-CM | POA: Insufficient documentation

## 2021-11-29 DIAGNOSIS — R519 Headache, unspecified: Secondary | ICD-10-CM | POA: Insufficient documentation

## 2021-11-29 DIAGNOSIS — Z79899 Other long term (current) drug therapy: Secondary | ICD-10-CM | POA: Insufficient documentation

## 2021-11-29 DIAGNOSIS — I959 Hypotension, unspecified: Secondary | ICD-10-CM | POA: Diagnosis not present

## 2021-11-29 DIAGNOSIS — I10 Essential (primary) hypertension: Secondary | ICD-10-CM | POA: Diagnosis not present

## 2021-11-29 DIAGNOSIS — Z7982 Long term (current) use of aspirin: Secondary | ICD-10-CM | POA: Diagnosis not present

## 2021-11-29 DIAGNOSIS — R5383 Other fatigue: Secondary | ICD-10-CM | POA: Diagnosis not present

## 2021-11-29 DIAGNOSIS — R55 Syncope and collapse: Secondary | ICD-10-CM | POA: Diagnosis not present

## 2021-11-29 DIAGNOSIS — D72829 Elevated white blood cell count, unspecified: Secondary | ICD-10-CM | POA: Insufficient documentation

## 2021-11-29 DIAGNOSIS — G43809 Other migraine, not intractable, without status migrainosus: Secondary | ICD-10-CM

## 2021-11-29 DIAGNOSIS — F17210 Nicotine dependence, cigarettes, uncomplicated: Secondary | ICD-10-CM | POA: Insufficient documentation

## 2021-11-29 DIAGNOSIS — R002 Palpitations: Secondary | ICD-10-CM | POA: Diagnosis not present

## 2021-11-29 HISTORY — PX: IR ANGIO INTRA EXTRACRAN SEL INTERNAL CAROTID UNI R MOD SED: IMG5362

## 2021-11-29 HISTORY — PX: IR ANGIOGRAM FOLLOW UP STUDY: IMG697

## 2021-11-29 HISTORY — PX: IR CT HEAD LTD: IMG2386

## 2021-11-29 LAB — I-STAT CHEM 8, ED
BUN: 21 mg/dL — ABNORMAL HIGH (ref 6–20)
Calcium, Ion: 1.03 mmol/L — ABNORMAL LOW (ref 1.15–1.40)
Chloride: 105 mmol/L (ref 98–111)
Creatinine, Ser: 0.5 mg/dL (ref 0.44–1.00)
Glucose, Bld: 88 mg/dL (ref 70–99)
HCT: 39 % (ref 36.0–46.0)
Hemoglobin: 13.3 g/dL (ref 12.0–15.0)
Potassium: 5.6 mmol/L — ABNORMAL HIGH (ref 3.5–5.1)
Sodium: 135 mmol/L (ref 135–145)
TCO2: 29 mmol/L (ref 22–32)

## 2021-11-29 LAB — CBC WITH DIFFERENTIAL/PLATELET
Abs Immature Granulocytes: 0.05 10*3/uL (ref 0.00–0.07)
Basophils Absolute: 0.1 10*3/uL (ref 0.0–0.1)
Basophils Relative: 0 %
Eosinophils Absolute: 0.4 10*3/uL (ref 0.0–0.5)
Eosinophils Relative: 3 %
HCT: 38.3 % (ref 36.0–46.0)
Hemoglobin: 12.4 g/dL (ref 12.0–15.0)
Immature Granulocytes: 0 %
Lymphocytes Relative: 16 %
Lymphs Abs: 2.1 10*3/uL (ref 0.7–4.0)
MCH: 27.5 pg (ref 26.0–34.0)
MCHC: 32.4 g/dL (ref 30.0–36.0)
MCV: 84.9 fL (ref 80.0–100.0)
Monocytes Absolute: 1.1 10*3/uL — ABNORMAL HIGH (ref 0.1–1.0)
Monocytes Relative: 8 %
Neutro Abs: 10 10*3/uL — ABNORMAL HIGH (ref 1.7–7.7)
Neutrophils Relative %: 73 %
Platelets: 160 10*3/uL (ref 150–400)
RBC: 4.51 MIL/uL (ref 3.87–5.11)
RDW: 14.3 % (ref 11.5–15.5)
WBC: 13.7 10*3/uL — ABNORMAL HIGH (ref 4.0–10.5)
nRBC: 0 % (ref 0.0–0.2)

## 2021-11-29 LAB — PROTIME-INR
INR: 1 (ref 0.8–1.2)
Prothrombin Time: 12.7 seconds (ref 11.4–15.2)

## 2021-11-29 IMAGING — CT CT ANGIO HEAD-NECK (W OR W/O PERF)
2 of 11 series · 7 of 33 positions shown · IV contrast (APPLIED)
Comparison: Head CTA [DATE] and MRI [DATE]. Cerebral
angiogram [DATE] and [DATE].

CLINICAL DATA: Syncope, right-sided headache, and fatigue. Status
post endovascular treatment of a right ICA aneurysm on [DATE].

EXAM:
CT ANGIOGRAPHY HEAD AND NECK
TECHNIQUE: Multidetector CT imaging of the head and neck was performed using
the standard protocol during bolus administration of intravenous
contrast. Multiplanar CT image reconstructions and MIPs were
obtained to evaluate the vascular anatomy. Carotid stenosis
measurements (when applicable) are obtained utilizing NASCET
criteria, using the distal internal carotid diameter as the
denominator.

[Series 10: cta neck/head · axial · 0.48mm/px · z∈[+1021,+1155]mm · 2 of 202 slices shown]
[im 68/202  soft-tissue]
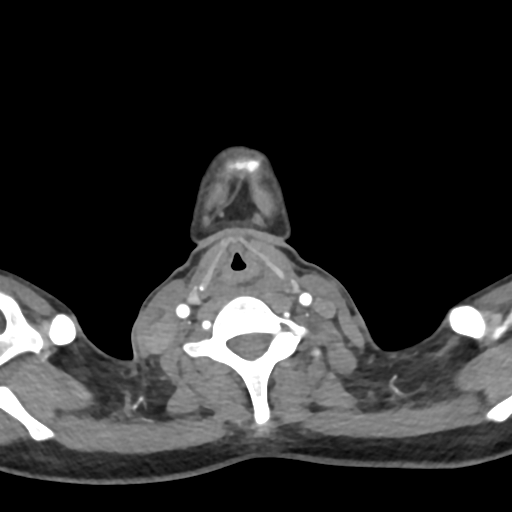
[im 135/202  soft-tissue]
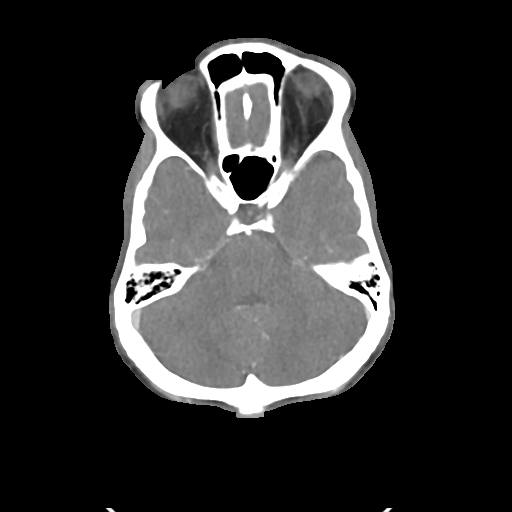

[Series 12: ax thins · axial · 0.37mm/px · z∈[+954,+1222]mm · 5 of 402 slices shown]
[im 67/402  soft-tissue]
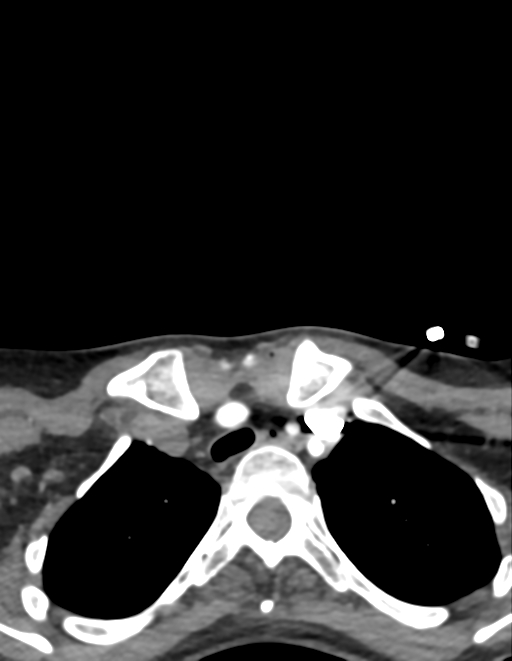
[im 134/402  bone]
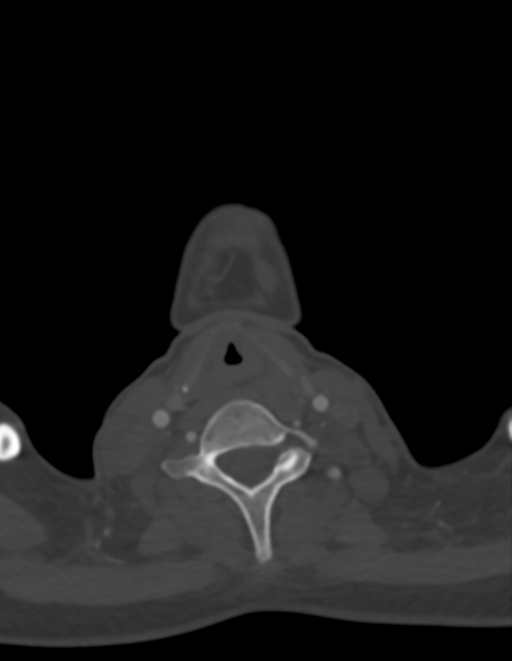
[im 201/402  soft-tissue]
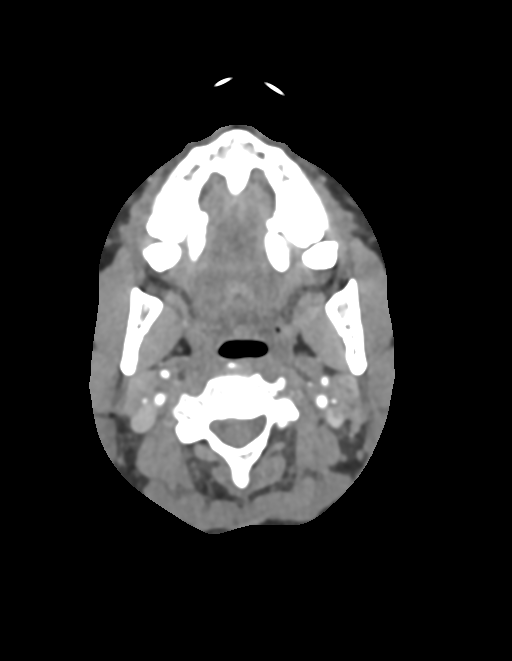
[im 268/402  bone]
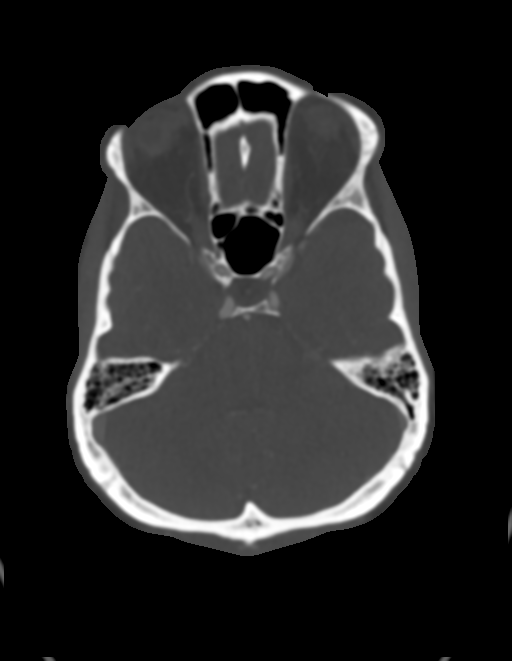
[im 335/402  soft-tissue]
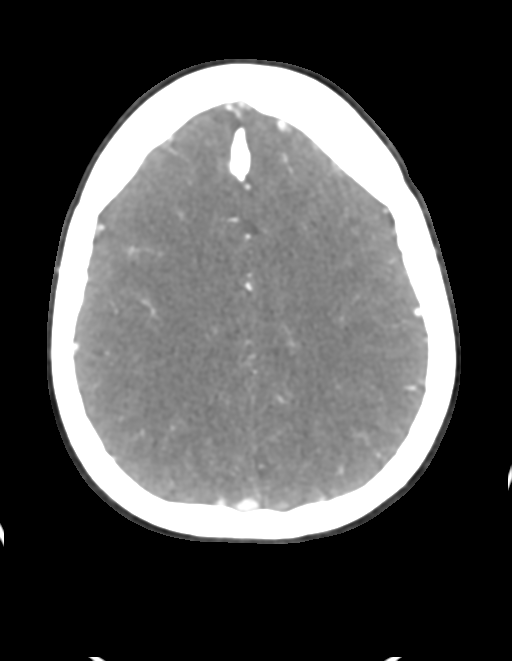

[7 of 33 positions shown; findings below may reference images not displayed]

RADIATION DOSE REDUCTION: This exam was performed according to the
departmental dose-optimization program which includes automated
exposure control, adjustment of the mA and/or kV according to
patient size and/or use of iterative reconstruction technique.

CONTRAST:  75mL OMNIPAQUE IOHEXOL 350 MG/ML SOLN
FINDINGS: CT HEAD FINDINGS

Brain: There is no evidence of an acute infarct, intracranial
hemorrhage, mass, midline shift, or extra-axial fluid collection.
The ventricles and sulci are normal.

Vascular: No hyperdense vessel.

Skull: No fracture or suspicious osseous lesion.

Sinuses: Osteoma in a right ethmoid air cell. No evidence of acute
inflammatory sinus disease. Clear mastoid air cells.

Orbits: Unremarkable.

Review of the MIP images confirms the above findings

CTA NECK FINDINGS

Aortic arch: Standard 3 vessel aortic arch with widely patent arch
vessel origins.

Right carotid system: Patent without evidence of stenosis,
dissection, or significant atherosclerosis.

Left carotid system: Patent without evidence of stenosis,
dissection, or significant atherosclerosis.

Vertebral arteries: Patent without evidence of stenosis, dissection,
or significant atherosclerosis. Strongly dominant right vertebral
artery.

Skeleton: No acute osseous abnormality or suspicious osseous lesion.

Other neck: No evidence of cervical lymphadenopathy or mass.

Upper chest: No apical lung consolidation or mass.

Review of the MIP images confirms the above findings

CTA HEAD FINDINGS

Anterior circulation: The internal carotid arteries are widely
patent from skull base to carotid termini with recent placement of a
pipeline flow diverter for treatment of a right paraclinoid
aneurysm. No definite aneurysm filling is identified, however
assessment is limited by the small size of the treated aneurysm and
blooming of the walls of the flow diverter device. ACAs and MCAs are
patent without evidence of a proximal branch occlusion or
significant proximal stenosis. No new aneurysm is identified.

Posterior circulation: The intracranial vertebral arteries are
patent with the left being diminutive distal to the PICA origin.
Patent PICA and SCA origins are seen bilaterally. The basilar artery
is widely patent with abnormal contour proximally attributed to a
fenestration on catheter angiography. There are small posterior
communicating arteries bilaterally. Both PCAs are patent without
evidence of a significant proximal stenosis. No aneurysm is
identified.

Venous sinuses: Patent.

Anatomic variants: Basilar artery fenestration.

Review of the MIP images confirms the above findings
IMPRESSION: 1. Unremarkable CT appearance of the brain. No evidence of acute
intracranial abnormality.
2. Status post endovascular treatment of a right paraclinoid ICA
aneurysm. No definite evidence of residual aneurysm filling.
3. No evidence of a new aneurysm, major branch occlusion, or
significant stenosis in the head or neck.

## 2021-11-29 MED ORDER — IOHEXOL 350 MG/ML SOLN
75.0000 mL | Freq: Once | INTRAVENOUS | Status: AC | PRN
  Administered 2021-11-29: 20:00:00 75 mL via INTRAVENOUS

## 2021-11-29 MED ORDER — PROCHLORPERAZINE EDISYLATE 10 MG/2ML IJ SOLN
10.0000 mg | Freq: Once | INTRAMUSCULAR | Status: AC
  Administered 2021-11-29: 16:00:00 10 mg via INTRAVENOUS
  Filled 2021-11-29: qty 2

## 2021-11-29 MED ORDER — LACTATED RINGERS IV BOLUS
1000.0000 mL | Freq: Once | INTRAVENOUS | Status: AC
  Administered 2021-11-29: 16:00:00 1000 mL via INTRAVENOUS

## 2021-11-29 MED ORDER — DIPHENHYDRAMINE HCL 50 MG/ML IJ SOLN
25.0000 mg | Freq: Once | INTRAMUSCULAR | Status: AC
  Administered 2021-11-29: 16:00:00 25 mg via INTRAVENOUS
  Filled 2021-11-29: qty 1

## 2021-11-29 NOTE — ED Provider Notes (Signed)
[facility] EMERGENCY DEPARTMENT Provider Note  CSN: [id number] Arrival date & time: 11/29/21 1440  Chief Complaint(s) Near Syncope and Headache  HPI Allison Leonard is a 46 y.o. 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  Patient states that she awoke this morning did smoke marijuana, but was feeling well until approximately 1 hour prior to arrival when she had an episode of syncope.  She then awoke with a gradually worsening headache.  She currently endorses headache but denies chest pain, shortness of breath, abdominal pain, nausea, vomiting, numbness, tingling, weakness or other neurologic or systemic complaints.   Near Syncope Associated symptoms include headaches.  Headache Associated symptoms: near-syncope    Past Medical History Past Medical History:  Diagnosis Date   Acid reflux 07/04/2018   Anemia    Brain aneurysm 2022   Celiac disease/sprue    Cyclic vomiting syndrome    Dyspnea    in past per patient 11/24/21   Generalized anxiety disorder 07/04/2018   History of colon polyps    last colonoscopy 2018 removed multiple benign polyps   Migraines 07/04/2018   Patient Active Problem List   Diagnosis Date Noted   Hypertension 11/28/2021   Brain aneurysm 11/27/2021   History of colonic polyps 11/13/3233   Cyclic vomiting syndrome 09/17/2019   Patient underweight 05/14/2019   Cannabis abuse 11/19/2018   Cannabinoid hyperemesis syndrome 10/27/2018   Migraines 07/04/2018   Home Medication(s) Prior to Admission medications   Medication Sig Start Date End Date Taking? Authorizing Provider  aspirin EC 81 MG tablet Take 1 tablet (81 mg total) by mouth daily. Swallow whole. 11/28/21   Theresa Duty, NP  clopidogrel (PLAVIX) 75 MG tablet Take 1 tablet (75 mg  total) by mouth daily. Patient taking differently: Take 37.5 mg by mouth daily. 09/08/21   Tyson Alias, NP  dicyclomine (BENTYL) 20 MG tablet Take 1 tablet (20 mg total) by mouth 3 (three) times daily as needed for spasms. Patient not taking: Reported on 11/21/2021 09/08/21   Thornton Park, MD  famotidine (PEPCID) 20 MG tablet Take 1 tablet (20 mg total) by mouth 2 (two) times daily. 09/08/21   Thornton Park, MD  nortriptyline (PAMELOR) 25 MG capsule Take 1 capsule (25 mg total) by mouth at bedtime. 06/27/21   Tomi Likens, Adam R, DO  Plecanatide (TRULANCE) 3 MG TABS Take 3 mg by mouth daily. 09/08/21   Thornton Park, MD  prochlorperazine (COMPAZINE) 10 MG tablet TAKE 1 TABLET BY MOUTH EVERY 8 HOURS AS NEEDED FOR NAUSEA AND VOMITING 11/03/21   Brimage, Ronnette Juniper, DO  Rimegepant Sulfate (NURTEC) 75 MG TBDP Take 75 mg by mouth as needed (Take 1 tab at the earlist onset of Migraine. max 1 tab in 24 hours). 08/30/21   Pieter Partridge, DO  Past Surgical History Past Surgical History:  Procedure Laterality Date   ABDOMINAL HYSTERECTOMY  2006   APPENDECTOMY  1999   COLONOSCOPY  2018   ESOPHAGOGASTRODUODENOSCOPY  2018   IR 3D INDEPENDENT WKST  09/01/2021   IR ANGIO INTRA EXTRACRAN SEL COM CAROTID INNOMINATE BILAT MOD SED  09/01/2021   IR ANGIO INTRA EXTRACRAN SEL INTERNAL CAROTID UNI R MOD SED  11/29/2021   IR ANGIO VERTEBRAL SEL VERTEBRAL BILAT MOD SED  09/01/2021   IR ANGIOGRAM FOLLOW UP STUDY  11/29/2021   IR CT HEAD LTD  11/29/2021   IR RADIOLOGIST EVAL & MGMT  08/21/2021   IR RADIOLOGIST EVAL & MGMT  09/07/2021   IR TRANSCATH/EMBOLIZ  11/27/2021   RADIOLOGY WITH ANESTHESIA N/A 11/27/2021   Procedure: Drucilla Schmidt;  Surgeon: Luanne Bras, MD;  Location: [facility];  Service: Radiology;  Laterality: N/A;   Family History Family History  Problem Relation Age of Onset    Diabetes Father    Hypertension Father    Hyperlipidemia Father    Colon polyps Father    Heart attack Maternal Grandmother    Heart attack Paternal Grandmother    Cervical cancer Maternal Aunt    Stomach cancer Maternal Aunt    Throat cancer Maternal Uncle    Stroke Maternal Uncle    Down syndrome Son    Colon cancer Sister    Esophageal cancer Neg Hx    Rectal cancer Neg Hx     Social History Social History   Tobacco Use   Smoking status: Every Day    Packs/day: 0.50    Years: 10.00    Pack years: 5.00    Types: Cigarettes   Smokeless tobacco: Never  Vaping Use   Vaping Use: Never used  Substance Use Topics   Alcohol use: Not Currently   Drug use: Yes    Frequency: 1.0 times per week    Types: Marijuana    Comment: Last use 11/18/21   Allergies Zofran [ondansetron hcl] and Morphine and related  Review of Systems Review of Systems  Cardiovascular:  Positive for near-syncope.  Neurological:  Positive for syncope and headaches.   Physical Exam Vital Signs  I have reviewed the triage vital signs BP 114/73    Pulse (!) 109    Temp 98.7 F (37.1 C) (Oral)    SpO2 99%   Physical Exam Vitals and nursing note reviewed.  Constitutional:      General: She is not in acute distress.    Appearance: She is well-developed.  HENT:     Head: Normocephalic and atraumatic.  Eyes:     Conjunctiva/sclera: Conjunctivae normal.  Cardiovascular:     Rate and Rhythm: Normal rate and regular rhythm.     Heart sounds: No murmur heard. Pulmonary:     Effort: Pulmonary effort is normal. No respiratory distress.     Breath sounds: Normal breath sounds.  Abdominal:     Palpations: Abdomen is soft.     Tenderness: There is no abdominal tenderness.  Musculoskeletal:        General: No swelling.     Cervical back: Neck supple.  Skin:    General: Skin is warm and dry.     Capillary Refill: Capillary refill takes less than 2 seconds.  Neurological:     Mental Status: She is  alert.     Cranial Nerves: No cranial nerve deficit or dysarthria.     Sensory: No sensory deficit.     Motor: No weakness.  Psychiatric:        Mood and Affect: Mood normal.    ED Results and Treatments Labs (all labs ordered are listed, but only abnormal results are displayed) Labs Reviewed - No data to display                                                                                                                        Radiology IR Angiogram Follow Up Study  Result Date: 11/29/2021 CLINICAL DATA:  Intractable headaches involving the right supraorbital and temporal regions. 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 EXAM: TRANSCATHETER THERAPY EMBOLIZATION COMPARISON:  Diagnostic catheter arteriogram of September 01, 2021. MEDICATIONS: Heparin 3,000 units IV. Ancef 2 g IV antibiotic was administered within 1 hour of the procedure. ANESTHESIA/SEDATION: General anesthesia. CONTRAST:  Omnipaque 300 approximately 75 mL. FLUOROSCOPY TIME:  Fluoroscopy Time: 16 minutes 54 seconds (1041 mGy). COMPLICATIONS: None immediate. TECHNIQUE: Informed written consent was obtained from the patient after a thorough discussion of the procedural risks, benefits and alternatives. All questions were addressed. Maximal Sterile Barrier Technique was utilized including caps, mask, sterile gowns, sterile gloves, sterile drape, hand hygiene and skin antiseptic. A timeout was performed prior to the initiation of the procedure. The right groin was prepped and draped in the usual sterile fashion. Thereafter using modified Seldinger technique, transfemoral access into the right common femoral artery was obtained without difficulty. Over a 0.035 inch guidewire, an 8 French 25 cm Pinnacle sheath was inserted. Through this, and also over 0.035 inch guidewire, a 5 [place] JB 1 catheter was advanced to the aortic arch region and selectively positioned in the innominate artery, and the right  common carotid artery. FINDINGS: Innominate arteriogram demonstrates the right subclavian artery and the common carotid artery proximally to be widely patent. The origin of the right vertebral artery is widely patent. The vessel is seen to opacify to the cranial skull base. The right common carotid arteriogram demonstrates the right external carotid artery at its origin to be mildly narrowed. Its branches opacify normally. The right internal carotid artery at the bulb to the cranial skull base is widely patent. The petrous, the cavernous and the supraclinoid segments demonstrate wide patency. There is mild fusiform dilatation of petrous, the cavernous and the proximal cavernous segments of the right internal carotid artery. The right middle and the right anterior cerebral artery opacify into the capillary and venous phases. Again demonstrated is the tubular saccular aneurysm in the right paraclinoid region arising from the posterior wall of the right internal carotid artery approximately 2.2 mm x 1.8 mm saccular aneurysm, also noted on the previous 3D rotational arteriogram re-formation. Transient opacification of the right posterior communicating artery is evident. ENDOVASCULAR TREATMENT OF RIGHT INTERNAL CAROTID ARTERY PARACLINOID ANEURYSM WITH PIPELINE FLOW DIVERTER DEVICE The diagnostic JB 1 catheter in the right common carotid artery was exchanged over a 0.035 inch 300 cm Rosen exchange guidewire for an 8 French 80 cm Infinity sheath. The guidewire was removed. Good  aspiration was obtained from the Infinity sheath in the distal right common carotid artery. A control arteriogram performed through the sheath demonstrated no evidence of spasms, dissections or intraluminal filling defects. Over a 0.035 inch Roadrunner guidewire, a 115 cm 5 [place] Navien guide catheter was then advanced to the cervical petrous junction. The guidewire was removed. Good aspiration was obtained from the hub of the 5 [place] Navien guide  catheter. A gentle control arteriogram performed through this demonstrated no evidence of spasms or of intraluminal filling defects. No change was seen in the intracranial circulation. Measurements were performed of the distal and the proximal landing zones of the pipeline construct to treat the paraclinoid aneurysm. A 3 mm x 14 mm pipeline shield flow diverter device was chosen. Over a 0.014 inch standard Synchro micro guidewire with a moderate J configuration, a 150 cm 027 microcatheter was advanced to the distal end of the Navien guide catheter. With the micro guidewire leading with a J-tip configuration and a torque device, access was obtained with the micro guidewire followed by the microcatheter into the M2 segment of the superior division of the right middle cerebral artery. The guidewire was removed. Good aspiration obtained of the microcatheter. A gentle control arteriogram performed through this demonstrated safe positioning of the tip of the microcatheter. This was then connected to continuous heparinized saline infusion. The 3 mm x 14 mm pipeline shield flow diverter device was then advanced under constant fluoroscopic guidance using biplane DSA technique to the distal end of the microcatheter. The O ring on the delivery microcatheter was loosened. The distal wire, and the distal few mm of the device was then unsheathed. With further unsheathing, the device was seen to open completely. The combination of the microcatheter with the device was then retrieved to the distal landing zone which was in the supraclinoid right ICA. Thereafter using advancement of the micro guidewire and fluffing of the microcatheter to the device in order to encourage full extension and position with the parent vessel, the device was deployed. Control arteriogram performed through the 5 French sheath in the right internal carotid artery demonstrated excellent coverage and apposition of the device at the placement site. Contrast  stagnation slow flow was seen in the aneurysm itself. The microcatheter was then advanced through the construct into the right middle cerebral artery and the wire captured. The combination was then retrieved and removed. A control arteriogram performed through the 5 [place] guide in the right internal carotid artery continued to demonstrate excellent apposition of the device at the intended site. Patency of the posterior communicating artery remained intact as did the anterior choroidal artery. Control arteriograms were then performed at 15 and 30 minutes post deployment. These continued to demonstrate excellent flow with apposition of the device with good coverage of the neck of the aneurysm. During this time, the patient was also given 3 mg of Integrilin IA in order to prevent platelet aggregation Intracranially, no evidence of intraluminal filling defects or of occlusion was evident. The 5 [place] guide was then removed. A final control arteriogram performed through the Infinity catheter in the right common carotid artery continued to demonstrate excellent flow through the right internal carotid artery extra cranially and intracranially. No change was seen in the right anterior and the right middle cerebral artery distributions. The 8 French Pinnacle sheath was then removed with hemostasis achieved with an 8 [place] Angio-Seal closure device. Distal pulses remained palpable in both feet unchanged. A flat panel CT of the brain on  the table demonstrated no evidence of intracranial hemorrhage. Patient was extubated without difficulty. Upon recovery, the patient denied any headaches, nausea, vomiting or of speech or visual changes. Patient was able to move all fours equally. She was then transferred to the PACU and then neuro ICU. Later in the evening, the patient complained of a 5/10 headache on the right side frontal and parietal regions. No change was seen in the clinical neurological examination. This responded to  Tylenol p.o., and patient was also prescribed IV Toradol as needed for moderate severe headaches. Later, the patient had multiple episodes of nausea and vomiting. This time the patient's blood pressure remained in the 767-341 range systolic despite having maximized on IV claveprix. Patient was in sinus tachycardia at a rate of 130 per minute. CCM consult was initiated. Patient was evaluated and treated as per CCM recommendations. The following morning, the patient felt significantly better overall. Her heart rate was in the 80s and 90s sinus rhythm. Blood pressure was in the appropriate range. Patient had no nausea, vomiting visual or speech difficulties. She was able to tolerate liquids and semi solids. The right groin appeared soft with mild tenderness without any palpable hematoma. She was then ambulated independently and discharged home under the care of her mother. The patient was advised to continue taking her dual antiplatelets, 81 aspirin, and 75 Plavix for 7 days. After that the patient was advised to continue with 81 aspirin, but to cut down her Plavix to 37.5 mg per day. Instructions were given to avoid stooping, bending or lifting weights above 10 pounds for 2 weeks. Patient was advised not to drive for a couple of weeks as well. She was advised to maintain adequate hydration and continue with her home medications as well. Patient was instructed to make an appointment with her primary care within 7 days also. 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 PLAN: Follow-up in clinic 2 weeks post discharge. INDICATION: Patient with intractable right-sided headaches. 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 Electronically Signed   By: Luanne Bras M.D.   On: 11/29/2021 08:14   IR CT Head  Ltd  Result Date: 11/29/2021 CLINICAL DATA:  Intractable headaches involving the right supraorbital and temporal regions. 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 EXAM: TRANSCATHETER THERAPY EMBOLIZATION COMPARISON:  Diagnostic catheter arteriogram of September 01, 2021. MEDICATIONS: Heparin 3,000 units IV. Ancef 2 g IV antibiotic was administered within 1 hour of the procedure. ANESTHESIA/SEDATION: General anesthesia. CONTRAST:  Omnipaque 300 approximately 75 mL. FLUOROSCOPY TIME:  Fluoroscopy Time: 16 minutes 54 seconds (1041 mGy). COMPLICATIONS: None immediate. TECHNIQUE: Informed written consent was obtained from the patient after a thorough discussion of the procedural risks, benefits and alternatives. All questions were addressed. Maximal Sterile Barrier Technique was utilized including caps, mask, sterile gowns, sterile gloves, sterile drape, hand hygiene and skin antiseptic. A timeout was performed prior to the initiation of the procedure. The right groin was prepped and draped in the usual sterile fashion. Thereafter using modified Seldinger technique, transfemoral access into the right common femoral artery was obtained without difficulty. Over a 0.035 inch guidewire, an 8 French 25 cm Pinnacle sheath was inserted. Through this, and also over 0.035 inch guidewire, a 5 [place] JB 1 catheter was advanced to the aortic arch region and selectively positioned in the innominate artery,  and the right common carotid artery. FINDINGS: Innominate arteriogram demonstrates the right subclavian artery and the common carotid artery proximally to be widely patent. The origin of the right vertebral artery is widely patent. The vessel is seen to opacify to the cranial skull base. The right common carotid arteriogram demonstrates the right external carotid artery at its origin to be mildly narrowed. Its branches opacify normally. The right internal carotid artery at the bulb to the cranial  skull base is widely patent. The petrous, the cavernous and the supraclinoid segments demonstrate wide patency. There is mild fusiform dilatation of petrous, the cavernous and the proximal cavernous segments of the right internal carotid artery. The right middle and the right anterior cerebral artery opacify into the capillary and venous phases. Again demonstrated is the tubular saccular aneurysm in the right paraclinoid region arising from the posterior wall of the right internal carotid artery approximately 2.2 mm x 1.8 mm saccular aneurysm, also noted on the previous 3D rotational arteriogram re-formation. Transient opacification of the right posterior communicating artery is evident. ENDOVASCULAR TREATMENT OF RIGHT INTERNAL CAROTID ARTERY PARACLINOID ANEURYSM WITH PIPELINE FLOW DIVERTER DEVICE The diagnostic JB 1 catheter in the right common carotid artery was exchanged over a 0.035 inch 300 cm Rosen exchange guidewire for an 8 French 80 cm Infinity sheath. The guidewire was removed. Good aspiration was obtained from the Infinity sheath in the distal right common carotid artery. A control arteriogram performed through the sheath demonstrated no evidence of spasms, dissections or intraluminal filling defects. Over a 0.035 inch Roadrunner guidewire, a 115 cm 5 [place] Navien guide catheter was then advanced to the cervical petrous junction. The guidewire was removed. Good aspiration was obtained from the hub of the 5 [place] Navien guide catheter. A gentle control arteriogram performed through this demonstrated no evidence of spasms or of intraluminal filling defects. No change was seen in the intracranial circulation. Measurements were performed of the distal and the proximal landing zones of the pipeline construct to treat the paraclinoid aneurysm. A 3 mm x 14 mm pipeline shield flow diverter device was chosen. Over a 0.014 inch standard Synchro micro guidewire with a moderate J configuration, a 150 cm 027  microcatheter was advanced to the distal end of the Navien guide catheter. With the micro guidewire leading with a J-tip configuration and a torque device, access was obtained with the micro guidewire followed by the microcatheter into the M2 segment of the superior division of the right middle cerebral artery. The guidewire was removed. Good aspiration obtained of the microcatheter. A gentle control arteriogram performed through this demonstrated safe positioning of the tip of the microcatheter. This was then connected to continuous heparinized saline infusion. The 3 mm x 14 mm pipeline shield flow diverter device was then advanced under constant fluoroscopic guidance using biplane DSA technique to the distal end of the microcatheter. The O ring on the delivery microcatheter was loosened. The distal wire, and the distal few mm of the device was then unsheathed. With further unsheathing, the device was seen to open completely. The combination of the microcatheter with the device was then retrieved to the distal landing zone which was in the supraclinoid right ICA. Thereafter using advancement of the micro guidewire and fluffing of the microcatheter to the device in order to encourage full extension and position with the parent vessel, the device was deployed. Control arteriogram performed through the 5 French sheath in the right internal carotid artery demonstrated excellent coverage and apposition of the device  at the placement site. Contrast stagnation slow flow was seen in the aneurysm itself. The microcatheter was then advanced through the construct into the right middle cerebral artery and the wire captured. The combination was then retrieved and removed. A control arteriogram performed through the 5 [place] guide in the right internal carotid artery continued to demonstrate excellent apposition of the device at the intended site. Patency of the posterior communicating artery remained intact as did the anterior  choroidal artery. Control arteriograms were then performed at 15 and 30 minutes post deployment. These continued to demonstrate excellent flow with apposition of the device with good coverage of the neck of the aneurysm. During this time, the patient was also given 3 mg of Integrilin IA in order to prevent platelet aggregation Intracranially, no evidence of intraluminal filling defects or of occlusion was evident. The 5 [place] guide was then removed. A final control arteriogram performed through the Infinity catheter in the right common carotid artery continued to demonstrate excellent flow through the right internal carotid artery extra cranially and intracranially. No change was seen in the right anterior and the right middle cerebral artery distributions. The 8 French Pinnacle sheath was then removed with hemostasis achieved with an 8 [place] Angio-Seal closure device. Distal pulses remained palpable in both feet unchanged. A flat panel CT of the brain on the table demonstrated no evidence of intracranial hemorrhage. Patient was extubated without difficulty. Upon recovery, the patient denied any headaches, nausea, vomiting or of speech or visual changes. Patient was able to move all fours equally. She was then transferred to the PACU and then neuro ICU. Later in the evening, the patient complained of a 5/10 headache on the right side frontal and parietal regions. No change was seen in the clinical neurological examination. This responded to Tylenol p.o., and patient was also prescribed IV Toradol as needed for moderate severe headaches. Later, the patient had multiple episodes of nausea and vomiting. This time the patient's blood pressure remained in the 017-510 range systolic despite having maximized on IV claveprix. Patient was in sinus tachycardia at a rate of 130 per minute. CCM consult was initiated. Patient was evaluated and treated as per CCM recommendations. The following morning, the patient felt  significantly better overall. Her heart rate was in the 80s and 90s sinus rhythm. Blood pressure was in the appropriate range. Patient had no nausea, vomiting visual or speech difficulties. She was able to tolerate liquids and semi solids. The right groin appeared soft with mild tenderness without any palpable hematoma. She was then ambulated independently and discharged home under the care of her mother. The patient was advised to continue taking her dual antiplatelets, 81 aspirin, and 75 Plavix for 7 days. After that the patient was advised to continue with 81 aspirin, but to cut down her Plavix to 37.5 mg per day. Instructions were given to avoid stooping, bending or lifting weights above 10 pounds for 2 weeks. Patient was advised not to drive for a couple of weeks as well. She was advised to maintain adequate hydration and continue with her home medications as well. Patient was instructed to make an appointment with her primary care within 7 days also. 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 PLAN: Follow-up in clinic 2 weeks post discharge. INDICATION: Patient with intractable right-sided headaches. 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 Electronically Signed   By: Luanne Bras M.D.   On: 11/29/2021 08:14   IR ANGIO INTRA EXTRACRAN SEL INTERNAL CAROTID UNI R MOD SED  Result Date: 11/29/2021 CLINICAL DATA:  Intractable headaches involving the right supraorbital and temporal regions. 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 EXAM: TRANSCATHETER THERAPY EMBOLIZATION COMPARISON:  Diagnostic catheter arteriogram of September 01, 2021. MEDICATIONS: Heparin 3,000 units IV. Ancef 2 g IV antibiotic was administered within 1 hour of the procedure.  ANESTHESIA/SEDATION: General anesthesia. CONTRAST:  Omnipaque 300 approximately 75 mL. FLUOROSCOPY TIME:  Fluoroscopy Time: 16 minutes 54 seconds (1041 mGy). COMPLICATIONS: None immediate. TECHNIQUE: Informed written consent was obtained from the patient after a thorough discussion of the procedural risks, benefits and alternatives. All questions were addressed. Maximal Sterile Barrier Technique was utilized including caps, mask, sterile gowns, sterile gloves, sterile drape, hand hygiene and skin antiseptic. A timeout was performed prior to the initiation of the procedure. The right groin was prepped and draped in the usual sterile fashion. Thereafter using modified Seldinger technique, transfemoral access into the right common femoral artery was obtained without difficulty. Over a 0.035 inch guidewire, an 8 French 25 cm Pinnacle sheath was inserted. Through this, and also over 0.035 inch guidewire, a 5 [place] JB 1 catheter was advanced to the aortic arch region and selectively positioned in the innominate artery, and the right common carotid artery. FINDINGS: Innominate arteriogram demonstrates the right subclavian artery and the common carotid artery proximally to be widely patent. The origin of the right vertebral artery is widely patent. The vessel is seen to opacify to the cranial skull base. The right common carotid arteriogram demonstrates the right external carotid artery at its origin to be mildly narrowed. Its branches opacify normally. The right internal carotid artery at the bulb to the cranial skull base is widely patent. The petrous, the cavernous and the supraclinoid segments demonstrate wide patency. There is mild fusiform dilatation of petrous, the cavernous and the proximal cavernous segments of the right internal carotid artery. The right middle and the right anterior cerebral artery opacify into the capillary and venous phases. Again demonstrated is the tubular saccular aneurysm in the right  paraclinoid region arising from the posterior wall of the right internal carotid artery approximately 2.2 mm x 1.8 mm saccular aneurysm, also noted on the previous 3D rotational arteriogram re-formation. Transient opacification of the right posterior communicating artery is evident. ENDOVASCULAR TREATMENT OF RIGHT INTERNAL CAROTID ARTERY PARACLINOID ANEURYSM WITH PIPELINE FLOW DIVERTER DEVICE The diagnostic JB 1 catheter in the right common carotid artery was exchanged over a 0.035 inch 300 cm Rosen exchange guidewire for an 8 French 80 cm Infinity sheath. The guidewire was removed. Good aspiration was obtained from the Infinity sheath in the distal right common carotid artery. A control arteriogram performed through the sheath demonstrated no evidence of spasms, dissections or intraluminal filling defects. Over a 0.035 inch Roadrunner guidewire, a 115 cm 5 [place] Navien guide catheter was then advanced to the cervical petrous junction. The guidewire was removed. Good aspiration was obtained from the hub of the 5 [place] Navien guide catheter. A gentle control arteriogram performed through this demonstrated no evidence of spasms or of intraluminal filling defects. No change was seen in the intracranial circulation. Measurements were performed of the distal and the proximal landing zones of the pipeline construct to treat the paraclinoid aneurysm. A 3 mm x 14 mm pipeline shield flow diverter device was chosen. Over a 0.014 inch  standard Synchro micro guidewire with a moderate J configuration, a 150 cm 027 microcatheter was advanced to the distal end of the Navien guide catheter. With the micro guidewire leading with a J-tip configuration and a torque device, access was obtained with the micro guidewire followed by the microcatheter into the M2 segment of the superior division of the right middle cerebral artery. The guidewire was removed. Good aspiration obtained of the microcatheter. A gentle control arteriogram  performed through this demonstrated safe positioning of the tip of the microcatheter. This was then connected to continuous heparinized saline infusion. The 3 mm x 14 mm pipeline shield flow diverter device was then advanced under constant fluoroscopic guidance using biplane DSA technique to the distal end of the microcatheter. The O ring on the delivery microcatheter was loosened. The distal wire, and the distal few mm of the device was then unsheathed. With further unsheathing, the device was seen to open completely. The combination of the microcatheter with the device was then retrieved to the distal landing zone which was in the supraclinoid right ICA. Thereafter using advancement of the micro guidewire and fluffing of the microcatheter to the device in order to encourage full extension and position with the parent vessel, the device was deployed. Control arteriogram performed through the 5 French sheath in the right internal carotid artery demonstrated excellent coverage and apposition of the device at the placement site. Contrast stagnation slow flow was seen in the aneurysm itself. The microcatheter was then advanced through the construct into the right middle cerebral artery and the wire captured. The combination was then retrieved and removed. A control arteriogram performed through the 5 [place] guide in the right internal carotid artery continued to demonstrate excellent apposition of the device at the intended site. Patency of the posterior communicating artery remained intact as did the anterior choroidal artery. Control arteriograms were then performed at 15 and 30 minutes post deployment. These continued to demonstrate excellent flow with apposition of the device with good coverage of the neck of the aneurysm. During this time, the patient was also given 3 mg of Integrilin IA in order to prevent platelet aggregation Intracranially, no evidence of intraluminal filling defects or of occlusion was  evident. The 5 [place] guide was then removed. A final control arteriogram performed through the Infinity catheter in the right common carotid artery continued to demonstrate excellent flow through the right internal carotid artery extra cranially and intracranially. No change was seen in the right anterior and the right middle cerebral artery distributions. The 8 French Pinnacle sheath was then removed with hemostasis achieved with an 8 [place] Angio-Seal closure device. Distal pulses remained palpable in both feet unchanged. A flat panel CT of the brain on the table demonstrated no evidence of intracranial hemorrhage. Patient was extubated without difficulty. Upon recovery, the patient denied any headaches, nausea, vomiting or of speech or visual changes. Patient was able to move all fours equally. She was then transferred to the PACU and then neuro ICU. Later in the evening, the patient complained of a 5/10 headache on the right side frontal and parietal regions. No change was seen in the clinical neurological examination. This responded to Tylenol p.o., and patient was also prescribed IV Toradol as needed for moderate severe headaches. Later, the patient had multiple episodes of nausea and vomiting. This time the patient's blood pressure remained in the 774-128 range systolic despite having maximized on IV claveprix. Patient was in sinus tachycardia at a rate of 130 per minute.  CCM consult was initiated. Patient was evaluated and treated as per CCM recommendations. The following morning, the patient felt significantly better overall. Her heart rate was in the 80s and 90s sinus rhythm. Blood pressure was in the appropriate range. Patient had no nausea, vomiting visual or speech difficulties. She was able to tolerate liquids and semi solids. The right groin appeared soft with mild tenderness without any palpable hematoma. She was then ambulated independently and discharged home under the care of her mother. The  patient was advised to continue taking her dual antiplatelets, 81 aspirin, and 75 Plavix for 7 days. After that the patient was advised to continue with 81 aspirin, but to cut down her Plavix to 37.5 mg per day. Instructions were given to avoid stooping, bending or lifting weights above 10 pounds for 2 weeks. Patient was advised not to drive for a couple of weeks as well. She was advised to maintain adequate hydration and continue with her home medications as well. Patient was instructed to make an appointment with her primary care within 7 days also. 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 PLAN: Follow-up in clinic 2 weeks post discharge. INDICATION: Patient with intractable right-sided headaches. 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 Electronically Signed   By: Luanne Bras M.D.   On: 11/29/2021 08:14    Pertinent labs & imaging results that were available during my care of the patient were reviewed by me and considered in my medical decision making (see MDM for details).  Medications Ordered in ED Medications - No data to display                                                                                                                                   Procedures Procedures  (including critical care time)  Medical Decision Making / ED Course   This patient presents to the ED for concern of headache, this involves an extensive number of treatment options, and is a complaint that carries with it a high risk of complications and morbidity.  The differential diagnosis includes postsurgical complication, migraine headache, tension headache, cluster headache  MDM: Patient seen emergency department for evaluation of a headache.  Physical exam is unremarkable with no focal  motor or sensory deficits, no cranial nerve deficits.  Laboratory evaluation with a mild leukocytosis to 13.7 likely elevated in the setting of stress demargination secondary to pain.  I-STAT chemistry with a potassium of 5.6 but concern for slight hemolysis.  CT angio of the brain and neck performed to confirm no postembolization complication but this was reassuringly negative.  Patient given headache cocktail with complete resolution of her symptoms.  No reevaluation, patient feeling significantly improved and is safe for discharge.  Patient then discharged.   Additional history  obtained:  -External records from outside source obtained and reviewed including: Chart review including previous notes, labs, imaging, consultation notes   Lab Tests: -I ordered, reviewed, and interpreted labs.   The pertinent results include:   Labs Reviewed - No data to display    Imaging Studies ordered: I ordered imaging studies including CT angio head and neck I independently visualized and interpreted imaging. I agree with the radiologist interpretation   Medicines ordered and prescription drug management: No orders of the defined types were placed in this encounter.   -I have reviewed the patients home medicines and have made adjustments as needed  Critical interventions none    Cardiac Monitoring: The patient was maintained on a cardiac monitor.  I personally viewed and interpreted the cardiac monitored which showed an underlying rhythm of: NSR  Social Determinants of Health:  Factors impacting patients care include: None   Reevaluation: After the interventions noted above, I reevaluated the patient and found that they have :improved  Co morbidities that complicate the patient evaluation  Past Medical History:  Diagnosis Date   Acid reflux 07/04/2018   Anemia    Brain aneurysm 2022   Celiac disease/sprue    Cyclic vomiting syndrome    Dyspnea    in past per patient 11/24/21    Generalized anxiety disorder 07/04/2018   History of colon polyps    last colonoscopy 2018 removed multiple benign polyps   Migraines 07/04/2018      Dispostion: I considered admission for this patient, but she has no life-threatening pathology at this time and does not meet inpatient criteria.  Symptoms completely resolved at this time is safe for discharge.     Final Clinical Impression(s) / ED Diagnoses Final diagnoses:  None     @PCDICTATION @    Teressa Lower, MD 11/29/21 2230

## 2021-11-29 NOTE — ED Notes (Signed)
Patient transported to CT 

## 2021-11-29 NOTE — Telephone Encounter (Signed)
Transition Care Management Follow-up Telephone Call Date of discharge and from where: 11/28/2021 from [facility] How have you been since you were released from the hospital? Pt stated that she is feeling well and did not have any questions at this time.  Any questions or concerns? No  Items Reviewed: Did the pt receive and understand the discharge instructions provided? Yes  Medications obtained and verified? Yes  Other? No  Any new allergies since your discharge? No  Dietary orders reviewed? No Do you have support at home? Yes   Functional Questionnaire: (I = Independent and D = Dependent) ADLs: I  Bathing/Dressing- I  Meal Prep- I  Eating- I  Maintaining continence- I  Transferring/Ambulation- I  Managing Meds- I   Follow up appointments reviewed:  PCP Hospital f/u appt confirmed? No   Specialist Hospital f/u appt confirmed? No  Dr. Arlean Hopping is to reach to patient for a two week follow up.  Are transportation arrangements needed? No  If their condition worsens, is the pt aware to call PCP or go to the Emergency Dept.? Yes Was the patient provided with contact information for the PCP's office or ED? Yes Was to pt encouraged to call back with questions or concerns? Yes

## 2021-11-29 NOTE — ED Triage Notes (Addendum)
Pt bib [facility] from home d/t a syncope episode while sitting on a chair in the living room. Pt stated she felt palpitations and hot.  She came around after 5 mins. Pt also c/o R side headache and fatigue.  Had brain aneurysm repaired yesterday. A&O X4 .

## 2021-11-30 ENCOUNTER — Telehealth: Payer: Self-pay

## 2021-11-30 NOTE — Telephone Encounter (Signed)
Transition Care Management Follow-up Telephone Call Date of discharge and from where: 11/29/2021[facility]  How have you been since you were released from the hospital? Pt stated she is doing fine.  Any questions or concerns? No  Items Reviewed: Did the pt receive and understand the discharge instructions provided? Yes  Medications obtained and verified?  No medications given at discharge  Other? No  Any new allergies since your discharge? No  Dietary orders reviewed? No Do you have support at home? Yes   Home Care and Equipment/Supplies: Were home health services ordered? not applicable If so, what is the name of the agency? N/A  Has the agency set up a time to come to the patient's home? not applicable Were any new equipment or medical supplies ordered?  No What is the name of the medical supply agency? N/A Were you able to get the supplies/equipment? not applicable Do you have any questions related to the use of the equipment or supplies? No  Functional Questionnaire: (I = Independent and D = Dependent) ADLs: I  Bathing/Dressing- I  Meal Prep- I  Eating- I  Maintaining continence- I  Transferring/Ambulation- I  Managing Meds- I  Follow up appointments reviewed:  PCP Hospital f/u appt confirmed? No   Specialist Hospital f/u appt confirmed? No   Are transportation arrangements needed? No  If their condition worsens, is the pt aware to call PCP or go to the Emergency Dept.? Yes Was the patient provided with contact information for the PCP's office or ED? Yes Was to pt encouraged to call back with questions or concerns? Yes

## 2021-12-04 ENCOUNTER — Other Ambulatory Visit: Payer: Self-pay | Admitting: Neurology

## 2021-12-07 ENCOUNTER — Ambulatory Visit: Payer: Medicaid Other | Admitting: Student

## 2021-12-08 ENCOUNTER — Ambulatory Visit: Payer: Medicaid Other | Admitting: Student

## 2021-12-09 ENCOUNTER — Other Ambulatory Visit: Payer: Self-pay | Admitting: Neurology

## 2021-12-18 ENCOUNTER — Ambulatory Visit (HOSPITAL_COMMUNITY)
Admission: RE | Admit: 2021-12-18 | Discharge: 2021-12-18 | Disposition: A | Discharge status: Home / Self Care | Payer: Medicaid Other | Source: Ambulatory Visit | Attending: Student | Admitting: Student

## 2021-12-18 ENCOUNTER — Other Ambulatory Visit: Payer: Self-pay

## 2021-12-18 DIAGNOSIS — I671 Cerebral aneurysm, nonruptured: Secondary | ICD-10-CM | POA: Diagnosis not present

## 2021-12-18 DIAGNOSIS — Z9889 Other specified postprocedural states: Secondary | ICD-10-CM | POA: Diagnosis not present

## 2021-12-19 HISTORY — PX: IR RADIOLOGIST EVAL & MGMT: IMG5224

## 2021-12-28 ENCOUNTER — Ambulatory Visit: Payer: Medicaid Other | Admitting: Family Medicine

## 2022-01-04 ENCOUNTER — Other Ambulatory Visit: Payer: Self-pay

## 2022-01-04 ENCOUNTER — Emergency Department (HOSPITAL_COMMUNITY): Payer: Medicaid Other

## 2022-01-04 ENCOUNTER — Encounter (HOSPITAL_COMMUNITY): Payer: Self-pay

## 2022-01-04 ENCOUNTER — Ambulatory Visit: Payer: Medicaid Other | Admitting: Family Medicine

## 2022-01-04 ENCOUNTER — Encounter: Payer: Self-pay | Admitting: Family Medicine

## 2022-01-04 ENCOUNTER — Emergency Department (HOSPITAL_COMMUNITY)
Admission: EM | Admit: 2022-01-04 | Discharge: 2022-01-04 | Disposition: A | Discharge status: Home / Self Care | Payer: Medicaid Other | Attending: Emergency Medicine | Admitting: Emergency Medicine

## 2022-01-04 VITALS — BP 141/87 | HR 125 | Wt 110.2 lb

## 2022-01-04 DIAGNOSIS — F17200 Nicotine dependence, unspecified, uncomplicated: Secondary | ICD-10-CM | POA: Insufficient documentation

## 2022-01-04 DIAGNOSIS — Z7901 Long term (current) use of anticoagulants: Secondary | ICD-10-CM | POA: Diagnosis not present

## 2022-01-04 DIAGNOSIS — R000 Tachycardia, unspecified: Secondary | ICD-10-CM | POA: Diagnosis not present

## 2022-01-04 DIAGNOSIS — Z7982 Long term (current) use of aspirin: Secondary | ICD-10-CM | POA: Insufficient documentation

## 2022-01-04 DIAGNOSIS — J439 Emphysema, unspecified: Secondary | ICD-10-CM | POA: Diagnosis not present

## 2022-01-04 DIAGNOSIS — R0602 Shortness of breath: Secondary | ICD-10-CM | POA: Diagnosis not present

## 2022-01-04 DIAGNOSIS — Z20822 Contact with and (suspected) exposure to covid-19: Secondary | ICD-10-CM | POA: Diagnosis not present

## 2022-01-04 LAB — CBC
HCT: 39.2 % (ref 36.0–46.0)
Hemoglobin: 12.5 g/dL (ref 12.0–15.0)
MCH: 27.2 pg (ref 26.0–34.0)
MCHC: 31.9 g/dL (ref 30.0–36.0)
MCV: 85.2 fL (ref 80.0–100.0)
Platelets: 252 10*3/uL (ref 150–400)
RBC: 4.6 MIL/uL (ref 3.87–5.11)
RDW: 14 % (ref 11.5–15.5)
WBC: 11 10*3/uL — ABNORMAL HIGH (ref 4.0–10.5)
nRBC: 0 % (ref 0.0–0.2)

## 2022-01-04 LAB — PROTIME-INR
INR: 0.9 (ref 0.8–1.2)
Prothrombin Time: 12.5 seconds (ref 11.4–15.2)

## 2022-01-04 LAB — BASIC METABOLIC PANEL
Anion gap: 7 (ref 5–15)
BUN: 19 mg/dL (ref 6–20)
CO2: 24 mmol/L (ref 22–32)
Calcium: 9.1 mg/dL (ref 8.9–10.3)
Chloride: 102 mmol/L (ref 98–111)
Creatinine, Ser: 0.58 mg/dL (ref 0.44–1.00)
GFR, Estimated: >60 mL/min (ref >60)
Glucose, Bld: 119 mg/dL — ABNORMAL HIGH (ref 70–99)
Potassium: 3.5 mmol/L (ref 3.5–5.1)
Sodium: 133 mmol/L — ABNORMAL LOW (ref 135–145)

## 2022-01-04 LAB — BRAIN NATRIURETIC PEPTIDE: B Natriuretic Peptide: 34 pg/mL (ref 0.0–100.0)

## 2022-01-04 LAB — RESP PANEL BY RT-PCR (FLU A&B, COVID) ARPGX2
Influenza A by PCR: NEGATIVE
Influenza B by PCR: NEGATIVE
SARS Coronavirus 2 by RT PCR: NEGATIVE

## 2022-01-04 LAB — TROPONIN I (HIGH SENSITIVITY): Troponin I (High Sensitivity): 5 ng/L (ref <18)

## 2022-01-04 IMAGING — CT CT ANGIO CHEST
2 of 6 series · 18 of 36 positions shown · IV contrast (agent unspecified)
Comparison: None.

CLINICAL DATA: Pulmonary embolism (PE) suspected, high prob.
Elevated D-dimer.

EXAM:
CT ANGIOGRAPHY CHEST WITH CONTRAST
TECHNIQUE: Multidetector CT imaging of the chest was performed using the
standard protocol during bolus administration of intravenous
contrast. Multiplanar CT image reconstructions and MIPs were
obtained to evaluate the vascular anatomy.

[Series 5: thins · axial · 0.62mm/px · z∈[+182,+440]mm · 17 of 293 slices shown]
[im 17/293  lung]
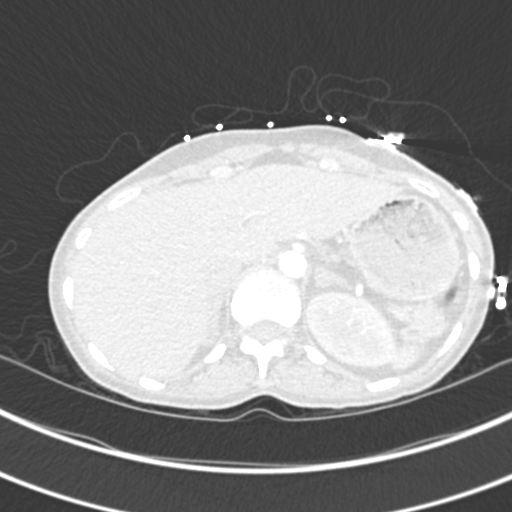
[im 33/293  mediastinal]
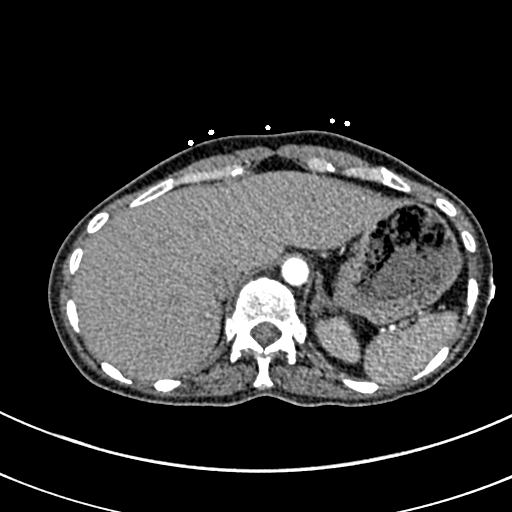
[im 49/293  lung]
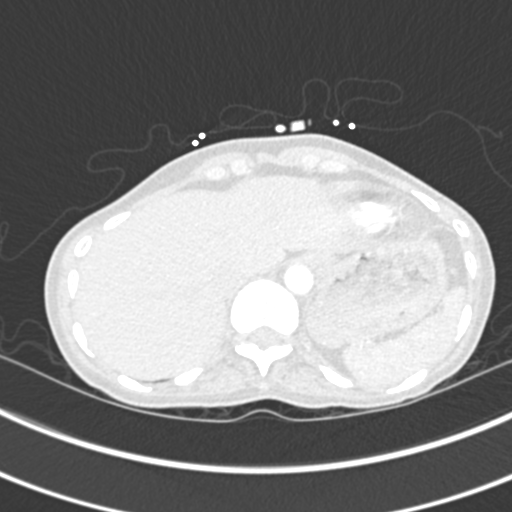
[im 65/293  mediastinal]
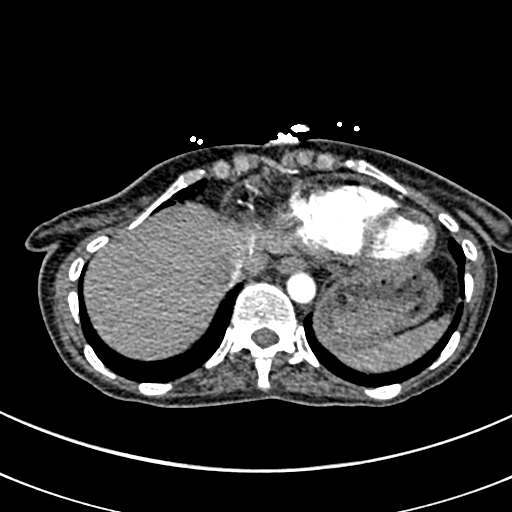
[im 82/293  lung]
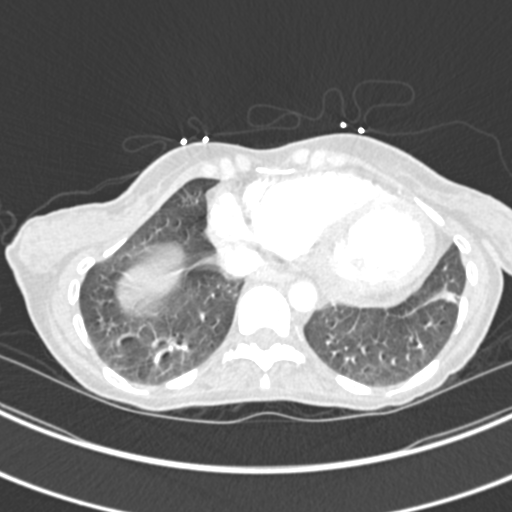
[im 98/293  mediastinal]
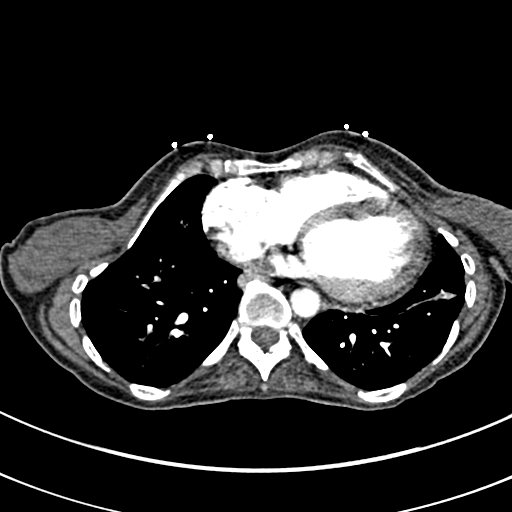
[im 114/293  lung]
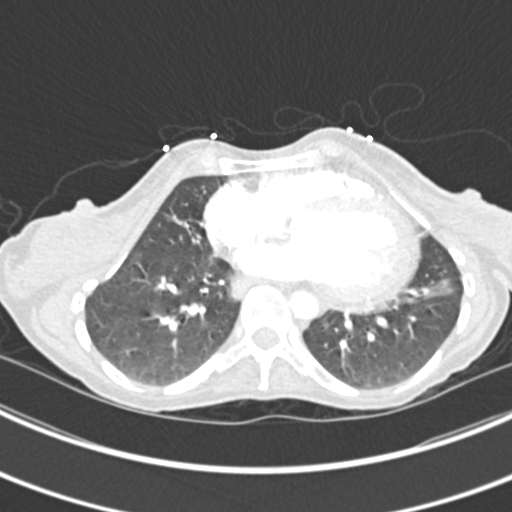
[im 130/293  mediastinal]
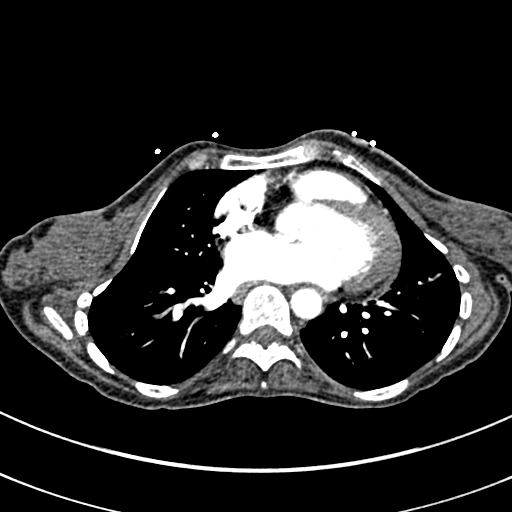
[im 147/293  lung]
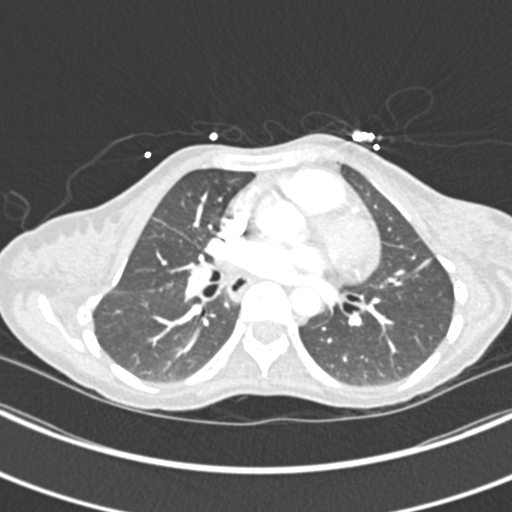
[im 163/293  mediastinal]
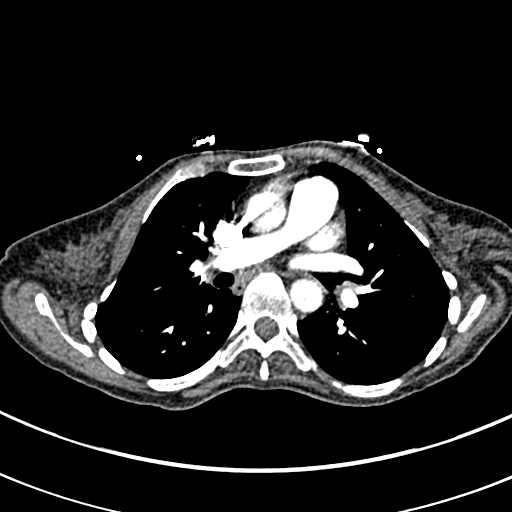
[im 179/293  lung]
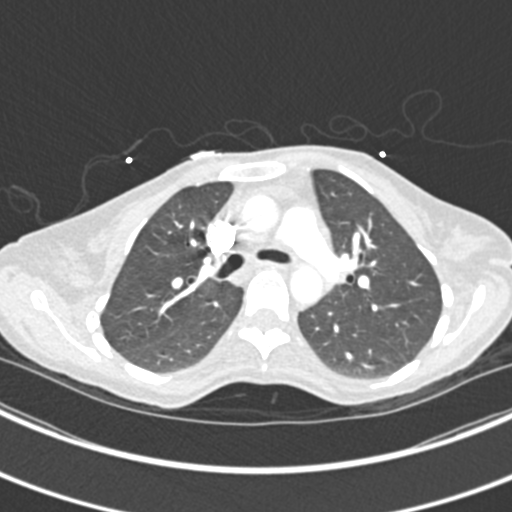
[im 195/293  mediastinal]
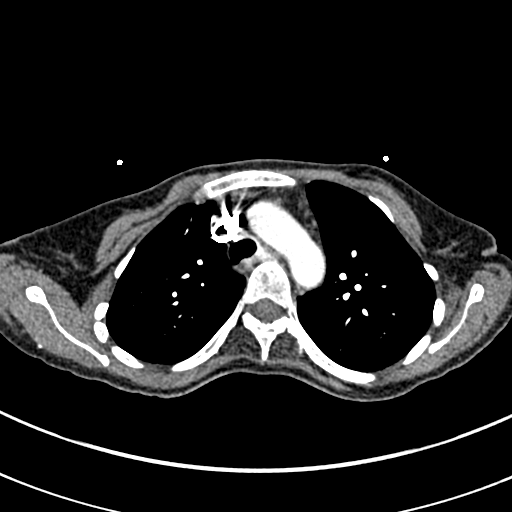
[im 211/293  lung]
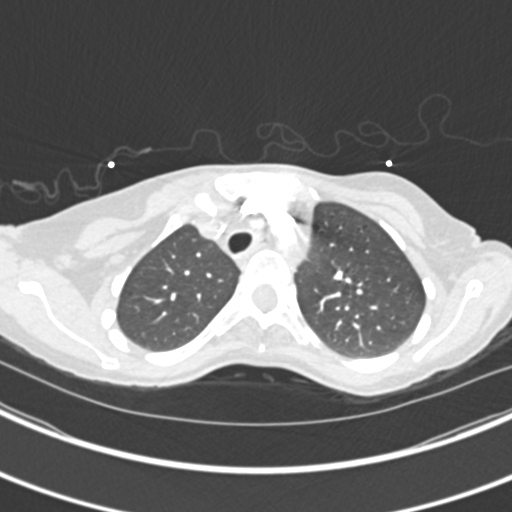
[im 228/293  mediastinal]
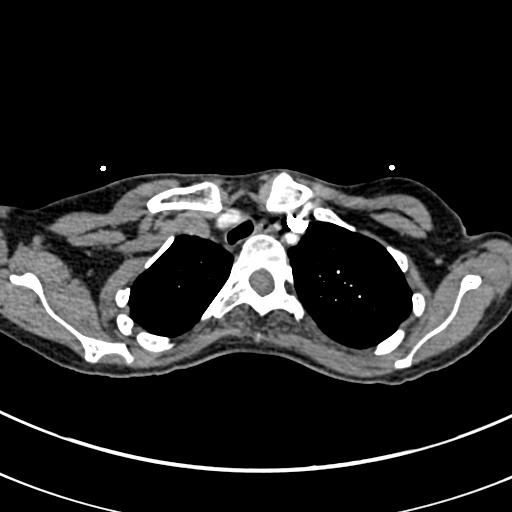
[im 244/293  lung]
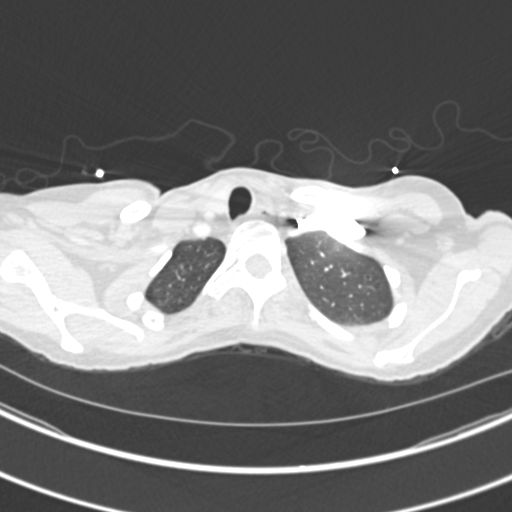
[im 260/293  mediastinal]
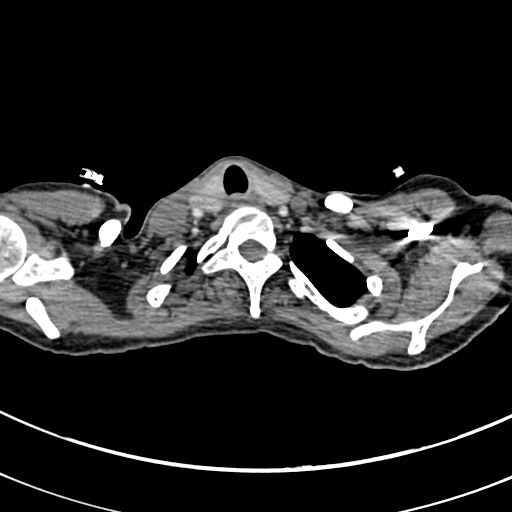
[im 276/293  lung]
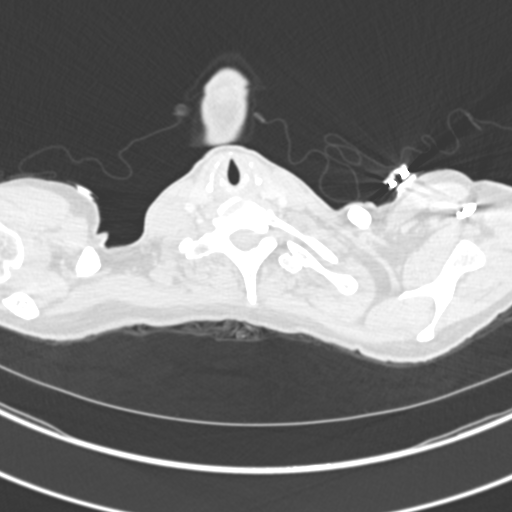

[Series 6: coronal mpr · coronal · 0.62mm/px · 1 of 85 slices shown]
[im 43/85  mediastinal]
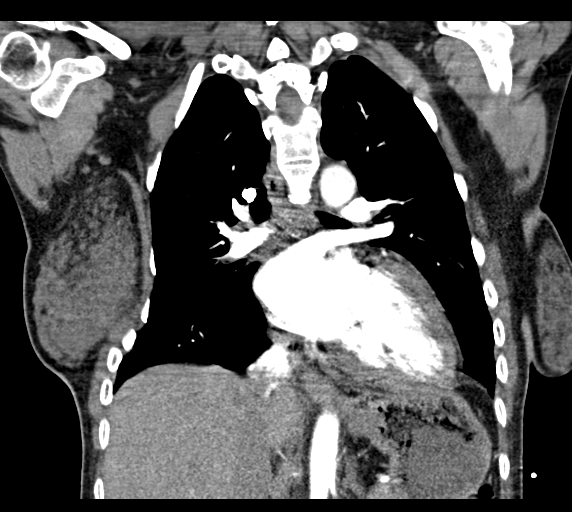

[18 of 36 positions shown; findings below may reference images not displayed]

RADIATION DOSE REDUCTION: This exam was performed according to the
departmental dose-optimization program which includes automated
exposure control, adjustment of the mA and/or kV according to
patient size and/or use of iterative reconstruction technique.

CONTRAST:  80mL OMNIPAQUE IOHEXOL 350 MG/ML SOLN
FINDINGS: Cardiovascular: Adequate opacification of the pulmonary arterial
tree. No intraluminal filling defect identified to suggest acute
pulmonary embolism. Central pulmonary arteries are of normal
caliber. No significant coronary artery calcification. Cardiac size
within normal limits when accounting for decreased AP diameter of
the thorax. No pericardial effusion. No significant atherosclerotic
calcification within the thoracic aorta. No aortic aneurysm.

Mediastinum/Nodes: No enlarged mediastinal, hilar, or axillary lymph
nodes. Thyroid gland, trachea, and esophagus demonstrate no
significant findings.

Lungs/Pleura: Mild bibasilar atelectasis or scarring. Minimal
paraseptal emphysema. Lungs are otherwise clear. No pneumothorax or
pleural effusion. No central obstructing lesion.

Upper Abdomen: No acute abnormality.

Musculoskeletal: No chest wall abnormality. No acute or significant
osseous findings.

Review of the MIP images confirms the above findings.
IMPRESSION: No pulmonary embolism.  No acute intrathoracic pathology identified.

## 2022-01-04 IMAGING — CR DG CHEST 2V
2 series · 2 of 2 positions shown · non-contrast
Comparison: None.

CLINICAL DATA: Recent surgery for brain aneurysm, tachycardia,
short of breath

EXAM:
CHEST - 2 VIEW

[w chest pa]
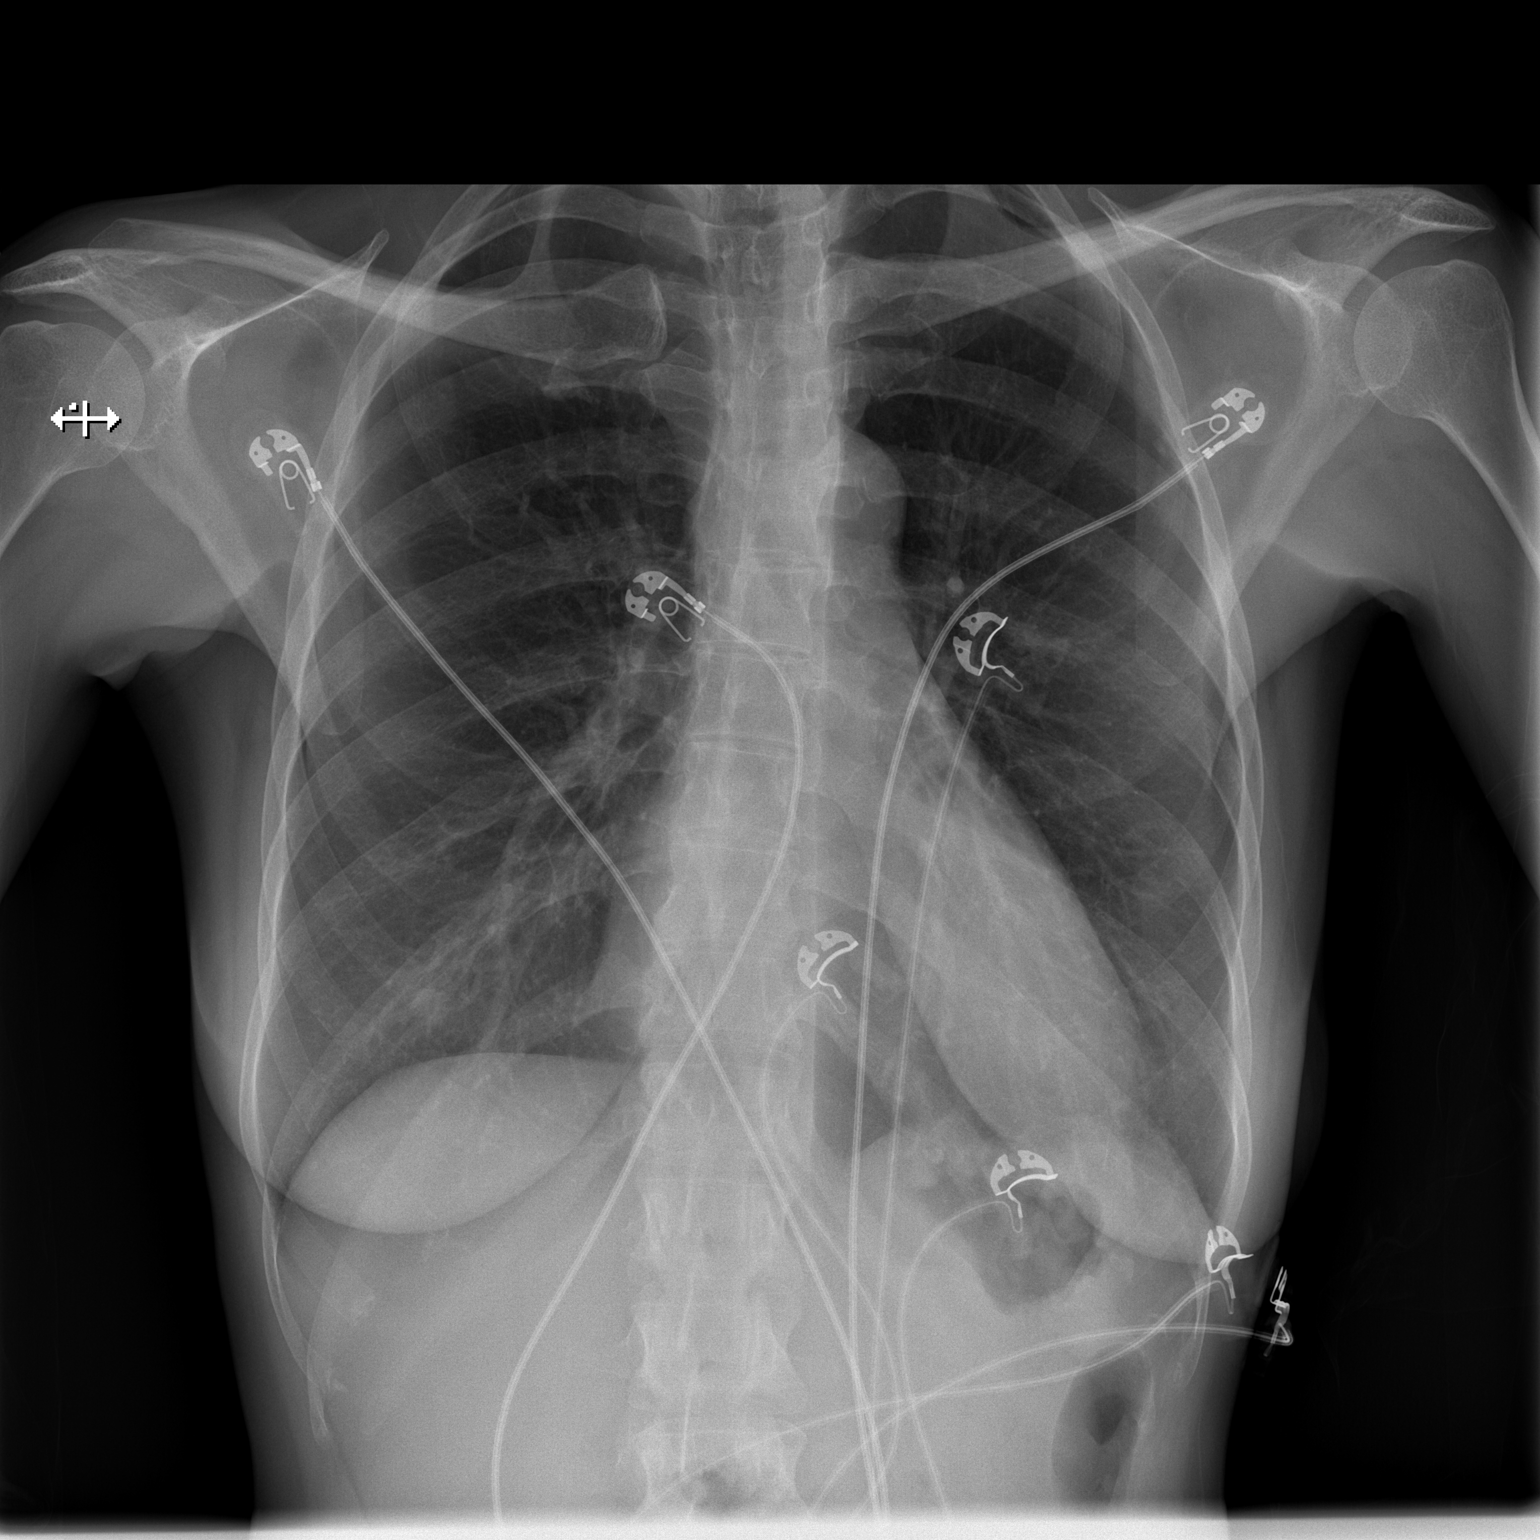

[w chest lat]
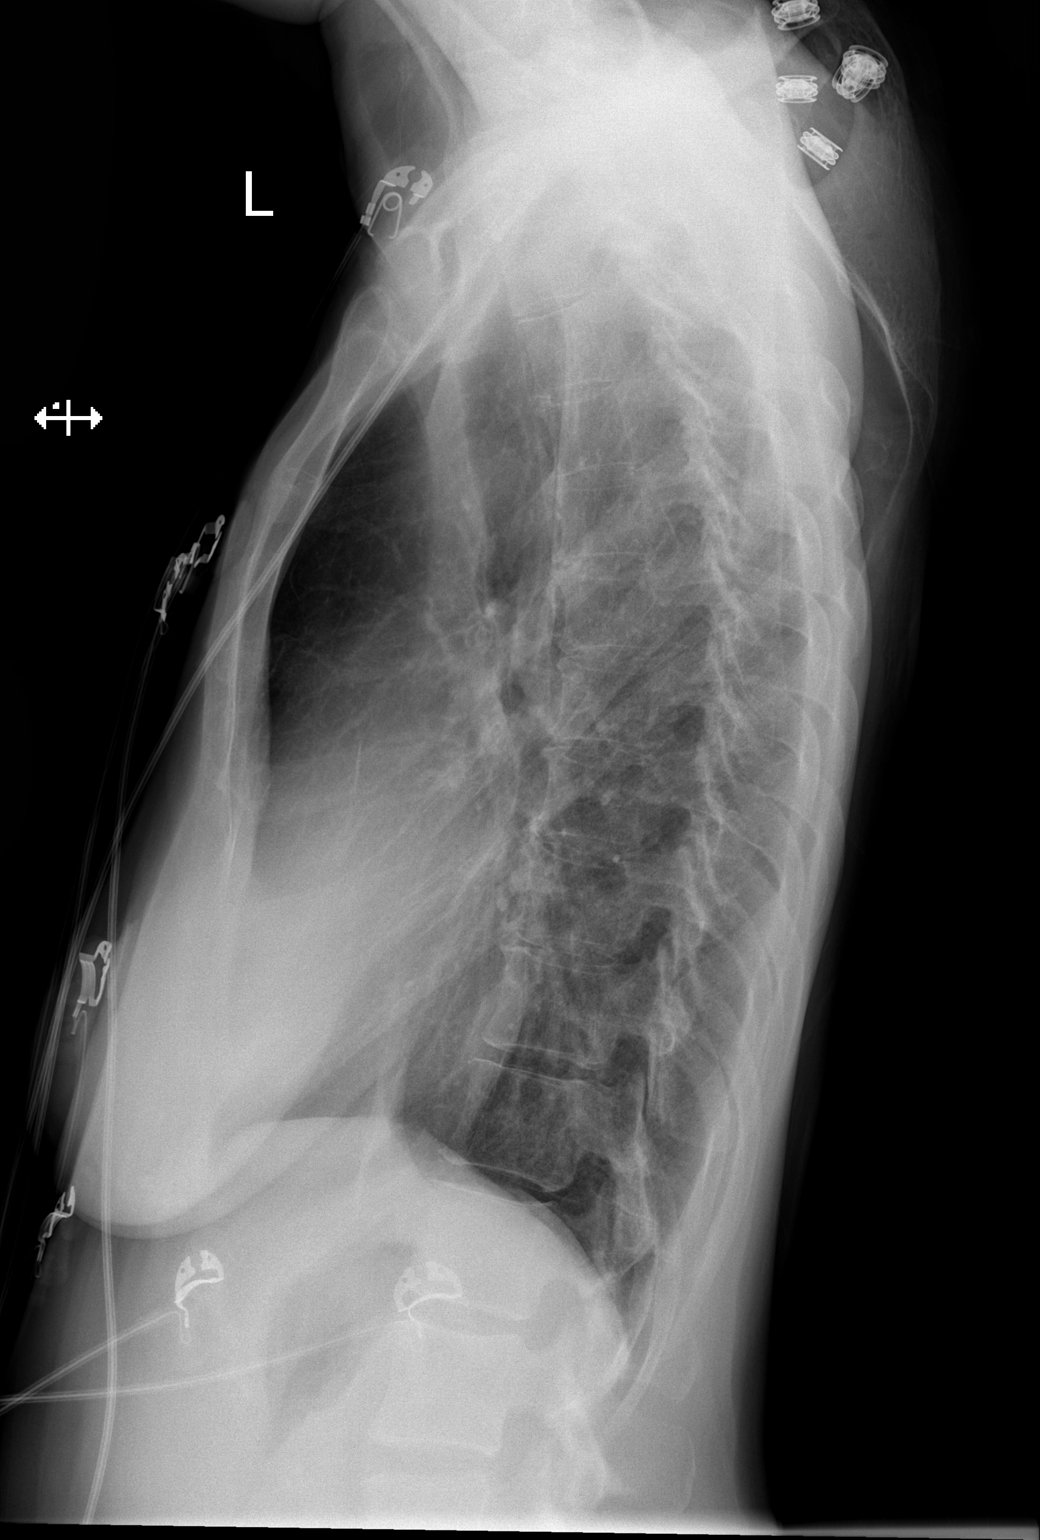

[2 of 2 positions shown; findings below may reference images not displayed]

FINDINGS: The heart size and mediastinal contours are within normal limits.
Both lungs are clear. The visualized skeletal structures are
unremarkable.
IMPRESSION: No active cardiopulmonary disease.

## 2022-01-04 MED ORDER — IOHEXOL 350 MG/ML SOLN
80.0000 mL | Freq: Once | INTRAVENOUS | Status: AC | PRN
  Administered 2022-01-04: 80 mL via INTRAVENOUS

## 2022-01-04 NOTE — Progress Notes (Signed)
Called patient as D-dimer was positive at 0.51 .  She is at moderate-high risk for a pulmonary embolism.  Advised her to go to the emergency department for a CTA chest.  Pt agreed with this plan.   ? ?Allison Leonard

## 2022-01-04 NOTE — Patient Instructions (Signed)
If you start having chest pain please go to the emergency department.  Distantly, if your shortness of breath worsens with palpitations please go to the emergency department ? ?A referral was placed for you to see a cardiologist.  If you have not received a phone call regarding scheduling an appointment within 2 weeks please give [place] a call. ? ? ?

## 2022-01-04 NOTE — ED Provider Notes (Signed)
[facility] DEPT Provider Note   CSN: [id number] Arrival date & time: 01/04/22  2045     History  Chief Complaint  Patient presents with   Shortness of Breath    Allison Leonard is a 46 y.o. female.  She was sent in from her primary care doctor's office for evaluation of an elevated D-dimer.  She had a cerebral aneurysm that was coiled late January.  Since then she has noted an elevated heart rate and feeling short of breath.  No cough chest pain fevers chills.  No nausea or vomiting.  She said her doctor had been watching it but decided to do some blood work and found the elevated D-dimer.  She is on Plavix and aspirin.  No prior history of DVT.  Denies any other stimulants.  The history is provided by the patient.  Shortness of Breath Severity:  Moderate Onset quality:  Gradual Duration:  1 month Timing:  Intermittent Progression:  Unchanged Chronicity:  New Relieved by:  Nothing Worsened by:  Nothing Ineffective treatments:  None tried Associated symptoms: no abdominal pain, no chest pain, no cough, no fever, no hemoptysis, no sputum production and no vomiting   Risk factors: tobacco use       Home Medications Prior to Admission medications   Medication Sig Start Date End Date Taking? Authorizing Provider  aspirin EC 81 MG tablet Take 1 tablet (81 mg total) by mouth daily. Swallow whole. 11/28/21   Theresa Duty, NP  clopidogrel (PLAVIX) 75 MG tablet Take 1 tablet (75 mg total) by mouth daily. Patient taking differently: Take 37.5 mg by mouth daily. 09/08/21   Tyson Alias, NP  dicyclomine (BENTYL) 20 MG tablet Take 1 tablet (20 mg total) by mouth 3 (three) times daily as needed for spasms. Patient not taking: Reported on 11/21/2021 09/08/21   Thornton Park, MD  famotidine (PEPCID) 20 MG tablet Take 1 tablet (20 mg total) by mouth 2 (two) times daily. 09/08/21   Thornton Park, MD  nortriptyline (PAMELOR) 25 MG capsule Take 1  capsule (25 mg total) by mouth at bedtime. 06/27/21   Tomi Likens, Adam R, DO  Plecanatide (TRULANCE) 3 MG TABS Take 3 mg by mouth daily. 09/08/21   Thornton Park, MD  prochlorperazine (COMPAZINE) 10 MG tablet TAKE 1 TABLET BY MOUTH EVERY 8 HOURS AS NEEDED FOR NAUSEA AND VOMITING 11/03/21   Brimage, Ronnette Juniper, DO  Rimegepant Sulfate (NURTEC) 75 MG TBDP Take 75 mg by mouth as needed (Take 1 tab at the earlist onset of Migraine. max 1 tab in 24 hours). 08/30/21   Pieter Partridge, DO      Allergies    Zofran Alvis Lemmings hcl] and Morphine and related    Review of Systems   Review of Systems  Constitutional:  Negative for fever.  Respiratory:  Positive for shortness of breath. Negative for cough, hemoptysis and sputum production.   Cardiovascular:  Negative for chest pain.  Gastrointestinal:  Negative for abdominal pain and vomiting.   Physical Exam Updated Vital Signs BP 113/73    Pulse (!) 104    Temp 98.5 F (36.9 C) (Oral)    Resp (!) 25    Ht 5' 2"  (1.575 m)    Wt 50.8 kg    SpO2 99%    BMI 20.49 kg/m  Physical Exam Vitals and nursing note reviewed.  Constitutional:      General: She is not in acute distress.    Appearance: Normal appearance. She  is well-developed.  HENT:     Head: Normocephalic and atraumatic.  Eyes:     Conjunctiva/sclera: Conjunctivae normal.  Cardiovascular:     Rate and Rhythm: Regular rhythm. Tachycardia present.     Heart sounds: No murmur heard. Pulmonary:     Effort: Pulmonary effort is normal. Tachypnea present. No respiratory distress.     Breath sounds: Normal breath sounds. No stridor. No wheezing.  Abdominal:     Palpations: Abdomen is soft.     Tenderness: There is no abdominal tenderness. There is no guarding or rebound.  Musculoskeletal:        General: No tenderness or deformity. Normal range of motion.     Cervical back: Neck supple.     Right lower leg: No tenderness. No edema.     Left lower leg: No tenderness. No edema.  Skin:    General:  Skin is warm and dry.  Neurological:     General: No focal deficit present.     Mental Status: She is alert.     GCS: GCS eye subscore is 4. GCS verbal subscore is 5. GCS motor subscore is 6.    ED Results / Procedures / Treatments   Labs (all labs ordered are listed, but only abnormal results are displayed) Labs Reviewed  BASIC METABOLIC PANEL - Abnormal; Notable for the following components:      Result Value   Sodium 133 (*)    Glucose, Bld 119 (*)    All other components within normal limits  CBC - Abnormal; Notable for the following components:   WBC 11.0 (*)    All other components within normal limits  RESP PANEL BY RT-PCR (FLU A&B, COVID) ARPGX2  BRAIN NATRIURETIC PEPTIDE  PROTIME-INR  TROPONIN I (HIGH SENSITIVITY)    EKG EKG Interpretation  Date/Time:  Thursday January 04 2022 21:17:19 EST Ventricular Rate:  108 PR Interval:  155 QRS Duration: 80 QT Interval:  347 QTC Calculation: 466 R Axis:   92 Text Interpretation: Sinus tachycardia Biatrial enlargement Borderline right axis deviation Probable left ventricular hypertrophy ST elev, probable normal early repol pattern No significant change since prior 1/23 Confirmed by Aletta Edouard [phone]) on 01/04/2022 9:28:35 PM  Radiology DG Chest 2 View  Result Date: 01/04/2022 CLINICAL DATA:  Recent surgery for brain aneurysm, tachycardia, short of breath EXAM: CHEST - 2 VIEW COMPARISON:  None. FINDINGS: The heart size and mediastinal contours are within normal limits. Both lungs are clear. The visualized skeletal structures are unremarkable. IMPRESSION: No active cardiopulmonary disease. Electronically Signed   By: Randa Ngo M.D.   On: 01/04/2022 21:32   CT Angio Chest PE W and/or Wo Contrast  Result Date: 01/04/2022 CLINICAL DATA:  Pulmonary embolism (PE) suspected, high prob. Elevated D-dimer. EXAM: CT ANGIOGRAPHY CHEST WITH CONTRAST TECHNIQUE: Multidetector CT imaging of the chest was performed using the standard  protocol during bolus administration of intravenous contrast. Multiplanar CT image reconstructions and MIPs were obtained to evaluate the vascular anatomy. RADIATION DOSE REDUCTION: This exam was performed according to the departmental dose-optimization program which includes automated exposure control, adjustment of the mA and/or kV according to patient size and/or use of iterative reconstruction technique. CONTRAST:  16m OMNIPAQUE IOHEXOL 350 MG/ML SOLN COMPARISON:  None. FINDINGS: Cardiovascular: Adequate opacification of the pulmonary arterial tree. No intraluminal filling defect identified to suggest acute pulmonary embolism. Central pulmonary arteries are of normal caliber. No significant coronary artery calcification. Cardiac size within normal limits when accounting for decreased AP diameter of  the thorax. No pericardial effusion. No significant atherosclerotic calcification within the thoracic aorta. No aortic aneurysm. Mediastinum/Nodes: No enlarged mediastinal, hilar, or axillary lymph nodes. Thyroid gland, trachea, and esophagus demonstrate no significant findings. Lungs/Pleura: Mild bibasilar atelectasis or scarring. Minimal paraseptal emphysema. Lungs are otherwise clear. No pneumothorax or pleural effusion. No central obstructing lesion. Upper Abdomen: No acute abnormality. Musculoskeletal: No chest wall abnormality. No acute or significant osseous findings. Review of the MIP images confirms the above findings. IMPRESSION: No pulmonary embolism.  No acute intrathoracic pathology identified. Electronically Signed   By: Fidela Salisbury M.D.   On: 01/04/2022 22:21    Procedures Procedures    Medications Ordered in ED Medications - No data to display  ED Course/ Medical Decision Making/ A&P Clinical Course as of 01/05/22 0841  Thu Jan 04, 2022  2137 Chest x-ray interpreted by me as no acute infiltrates. [MB]  2256 Reviewed results of work-up with patient.  She is comfortable plan with  outpatient follow-up with her treating provider.  No indications for second troponin.  She said they are actually referring her on to cardiology which I think is reasonable. [MB]    Clinical Course User Index [MB] Hayden Rasmussen, MD                           Medical Decision Making Amount and/or Complexity of Data Reviewed Labs: ordered. Radiology: ordered.  This patient complains of tachycardia chest tightness shortness of breath; this involves an extensive number of treatment Options and is a complaint that carries with it a high risk of complications and morbidity. The differential includes anxiety, anemia, ACS, pneumonia, pneumothorax, PE, vascular, COVID, flu  I ordered, reviewed and interpreted labs, which included CBC with mildly elevated white count normal hemoglobin, chemistries normal other than mildly low sodium elevated glucose, troponin unremarkable, BNP normal, COVID and flu negative I ordered imaging studies which included chest x-ray and CT angio chest and I independently    visualized and interpreted imaging which showed no acute findings Previous records obtained and reviewed in epic, last PCP visit showing patient had an elevated D-dimer of 0.51   Cardiac monitoring reviewed, patient in sinus tachycardia normal sinus rhythm Social determinants considered, tobacco use Critical Interventions: None  After the interventions stated above, I reevaluated the patient and found patient to be fairly asymptomatic. Admission and further testing considered, no indications for admission at this time.  Recommended close follow-up with her treating provider for further evaluation of her shortness of breath.  She said she has an outpatient follow-up with cardiology.          Final Clinical Impression(s) / ED Diagnoses Final diagnoses:  Shortness of breath  Tachycardia    Rx / DC Orders ED Discharge Orders     None         Hayden Rasmussen, MD 01/05/22 [phone]

## 2022-01-04 NOTE — Progress Notes (Signed)
d ? ?SUBJECTIVE:  ? ?CHIEF COMPLAINT / HPI:  ? ?Chief Complaint  ?Patient presents with  ? Tachycardia  ? ? ? ?Allison Leonard is a 46 y.o. female here for tachycardia follow-up.  She had her cerebral aneurysm surgery about 5 to 6 weeks ago.  The day after discharge she states that she passed out at home.  EMS was called.  She has been more short of breath recently.  Notes that when she walks around the grocery store she is not able to catch her breath without her mask off.  Notes palpitations.  Denies chest pain. ? ?Pt reports during her admission her heart rate got up to 160s.  She is taking Plavix. ? ? ?PERTINENT  PMH / PSH: reviewed and updated as appropriate  ? ?OBJECTIVE:  ? ?BP (!) 141/87   Pulse (!) 125   Wt 110 lb 3.2 oz (50 kg)   SpO2 99%   BMI 20.16 kg/m?   ? 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 ? ? ?ASSESSMENT/PLAN:  ? 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  She recently had a cerebral aneurysm clipped on 11/27/2021.  She is taking Plavix 37.5 mg daily.  She is not taking aspirin.  On chart review, heart rates have been in the lower 100s at previous office visits.  She is tachycardic to 125 today.  EKG obtained with sinus tachycardia with a  ?ST elevation seen in lead II, aVF.  Will obtain stat D-dimer and echocardiogram.  She is at moderate risk for PE.  If D-dimer positive, recommended she go to the emergency department for CTA chest.  Either way, she should see a cardiologist for additional evaluation.   ?  ? ? ?Lyndee Hensen, DO ?PGY-3,  Family Medicine ?01/04/2022  ? ? ? ? ? ? ? ? ?

## 2022-01-04 NOTE — ED Triage Notes (Signed)
Patient had surgery on her brain for an aneurysm last month. Today went for a recheck at her doctor because they were concerned about her high heart rate. They did a clotting blood test and it came back positive so they told her to come to the ER. Patient short of breath when she talks a lot and walks. ?

## 2022-01-04 NOTE — Discharge Instructions (Signed)
You were seen in the emergency department for evaluation of continued tachycardia and shortness of breath.  You had lab work EKG and a CAT scan of your chest.  There is no evidence of blood clot.  The rest of your lab work was unremarkable.  Please follow-up with your primary care doctor and cardiologist as scheduled. ?

## 2022-01-05 ENCOUNTER — Telehealth: Payer: Self-pay

## 2022-01-05 LAB — D-DIMER, QUANTITATIVE: D-DIMER: 0.51 mg/L FEU — ABNORMAL HIGH (ref 0.00–0.49)

## 2022-01-05 NOTE — Telephone Encounter (Signed)
Transition Care Management Follow-up Telephone Call ?Date of discharge and from where: 01/04/2022 from [facility] ?How have you been since you were released from the hospital? Patient stated that she is feeling better and did not have any questions or concerns at this time.  ?Any questions or concerns? No ? ?Items Reviewed: ?Did the pt receive and understand the discharge instructions provided? Yes  ?Medications obtained and verified? Yes  ?Other? No  ?Any new allergies since your discharge? No  ?Dietary orders reviewed? No ?Do you have support at home? Yes  ? ?Functional Questionnaire: (I = Independent and D = Dependent) ?ADLs: I ? ?Bathing/Dressing- I ? ?Meal Prep- I ? ?Eating- I ? ?Maintaining continence- I ? ?Transferring/Ambulation- I ? ?Managing Meds- I ? ? ?Follow up appointments reviewed: ? ?PCP Hospital f/u appt confirmed? No   ?Specialist Hospital f/u appt confirmed? Yes  Scheduled to see Metta Clines, DO on 01/08/2022 @ 09:50am. ?Are transportation arrangements needed? No  ?If their condition worsens, is the pt aware to call PCP or go to the Emergency Dept.? Yes ?Was the patient provided with contact information for the PCP's office or ED? Yes ?Was to pt encouraged to call back with questions or concerns? Yes ? ?

## 2022-01-06 ENCOUNTER — Encounter: Payer: Self-pay | Admitting: Family Medicine

## 2022-01-06 DIAGNOSIS — R000 Tachycardia, unspecified: Secondary | ICD-10-CM | POA: Insufficient documentation

## 2022-01-06 NOTE — Assessment & Plan Note (Signed)
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  She recently had a cerebral aneurysm clipped on 11/27/2021.  She is taking Plavix 37.5 mg daily.  She is not taking aspirin.  On chart review, heart rates have been in the lower 100s at previous office visits.  She is tachycardic to 125 today.  EKG obtained with sinus tachycardia with a  ?ST elevation seen in lead II, aVF.  Will obtain stat D-dimer and echocardiogram.  She is at moderate risk for PE.  If D-dimer positive, recommended she go to the emergency department for CTA chest.  Either way, she should see a cardiologist for additional evaluation.   ?

## 2022-01-08 ENCOUNTER — Telehealth: Payer: Medicaid Other | Admitting: Neurology

## 2022-01-08 ENCOUNTER — Other Ambulatory Visit: Payer: Self-pay

## 2022-01-12 ENCOUNTER — Ambulatory Visit: Payer: Medicaid Other | Admitting: Internal Medicine

## 2022-01-12 NOTE — Progress Notes (Signed)
? ?NEUROLOGY FOLLOW UP OFFICE NOTE ? ?Allison Leonard ?[id number] ? ?Assessment/Plan:  ? 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  ?Increase nortriptyline to 24m at bedtime ?Continue Nurtec for rescue medication.  Triptans are contraindicated.  Ubrelvy ineffective. ?Limit use of pain relievers to no more than 2 days out of week to prevent risk of rebound or medication-overuse headache. ?Keep headache diary ?Follow up 6 months. ?  ?  ?Subjective:  ?Allison Leonard a 46year old female with Celiac disease and Cyclic Vomiting Syndrome who follows up for migraines. ?  ?UPDATE: ?Repeat CTA head performed 08/02/2021 personally reviewed again demonstrated 1-2 mm right paraclinoid ICA aneurysm vs infundibulum and 2 mm proximal basilar artery aneurysm vs fenestration, unchanged from prior study.  She was referred to endovascular interventional radiology.  On 11/27/2021, she underwent embolization with pipeline shield flow diverter device for the right ICA aneurysm.  She was discharged but she had a syncopal episode where she developed a migraine afterwards, prompting her to go to the ED.  CTA head and neck personally reviewed revealed no postembolization complications.  She was treated with a headache cocktail.   ? ?Started nortriptyline. ?Intensity:  8-8.5/10 ?Duration:  all day.  Within 45 minutes with Nurtec but having difficulty getting it filled.  ?Frequency:  at least 2 days a week. ?  ?Frequency:  13 headaches in last 30 days ?Current NSAIDS:  none ?Current analgesics:  none ?Current triptans:  contraindicated - cerebral aneurysms, history of cerebral venous thrombosis ?Current ergotamine:  none ?Current anti-emetic:  none ?Current muscle relaxants:  none ?Current sleep aide:   none ?Current Antihypertensive medications:  none ?Current Antidepressant medications:  nortriptyline 46mQHS ?Current Anticonvulsant medications: none ?Current anti-CGRP:  none ?Current Vitamins/Herbal/Supplements:  none ?Current Antihistamines/Decongestants:  none ?Other therapy:  none ?Hormone/birth control:  none ?  ?Caffeine:  Coffee 1 to 2 times a month.  No soda ?Exercise:  no ?Depression:  no; Anxiety:  no ?Other pain:  Sometimes abdominal discomfort ?Sleep hygiene:  Restless ?  ?HISTORY:  ?Started having headaches at age 46 Worse over the past 3 to 4 years.  They are mostly severe sharp or pounding headache in her right temple.  Usually lasts a day.  Sometimes they wake her up at night.  17 days over last 30 days.  Nausea, vomiting, photophobia, phonophobia, blurred vision.  No numbness or weakness.  No specific triggers.  Vomiting may help make it better. ? ?MRI brain with and without contrast on 06/03/2020 personally reviewed showed no acute intracranial abnormality but did show chronic nonocclusive thrombus in the superior sagittal sinus; 1-3 mm outpouchings involving the right cavernous ICA, left ICA terminus, proximal basilar artery and right P1 segment; and mild to moderate bilateral A1 segment narrowing.  Follow up CTA of head on 06/20/2020 personally reviewed redemonstrated 1-2 mm distal cavernous/paraclinoid right ICA aneurysm and 2 mm proximal basilar artery aneurysm but previously seen small aneurysms of the right P1 PCA and left ICA terminus not appreciated.  She was referred to interventional radiology at that time but she never received a call.  Hypercoagulable panel including Factor V Leiden gene mutation, from June and August 2021 was negative. ?  ?Past NSAIDS:  ibuprofen ?Past analgesics:  Fioricet, Excedrin ?Past abortive triptans:  sumatriptan 10070mPast abortive ergotamine:  none ?Past muscle relaxants:  none ?  Past anti-emetic:  Compazine, Reglan 30m; promethazine 12.530m?Past  antihypertensive medications:  none ?Past antidepressant medications:  none ?Past anticonvulsant medications:  topiramate ?Past anti-CGRP:  UbRoselyn MeierPast vitamins/Herbal/Supplements:  none ?Past antihistamines/decongestants:  Benadryl ?Other past therapies:  none ?  ?Family history of headache:  Brother, sister, mother, daughter ? ?PAST MEDICAL HISTORY: ?Past Medical History:  ?Diagnosis Date  ? Acid reflux 07/04/2018  ? Anemia   ? Brain aneurysm 2022  ? Celiac disease/sprue   ? Cyclic vomiting syndrome   ? Dyspnea   ? in past per patient 11/24/21  ? Generalized anxiety disorder 07/04/2018  ? History of colon polyps   ? last colonoscopy 2018 removed multiple benign polyps  ? Migraines 07/04/2018  ? ? ?MEDICATIONS: ?Current Outpatient Medications on File Prior to Visit  ?Medication Sig Dispense Refill  ? aspirin EC 81 MG tablet Take 1 tablet (81 mg total) by mouth daily. Swallow whole. (Patient not taking: Reported on 01/06/2022) 30 tablet 11  ? clopidogrel (PLAVIX) 75 MG tablet Take 1 tablet (75 mg total) by mouth daily. (Patient taking differently: Take 37.5 mg by mouth daily.) 30 tablet 2  ? dicyclomine (BENTYL) 20 MG tablet Take 1 tablet (20 mg total) by mouth 3 (three) times daily as needed for spasms. (Patient not taking: Reported on 11/21/2021) 30 tablet 11  ? famotidine (PEPCID) 20 MG tablet Take 1 tablet (20 mg total) by mouth 2 (two) times daily. 180 tablet 3  ? nortriptyline (PAMELOR) 25 MG capsule Take 1 capsule (25 mg total) by mouth at bedtime. 30 capsule 5  ? Plecanatide (TRULANCE) 3 MG TABS Take 3 mg by mouth daily. 30 tablet 11  ? prochlorperazine (COMPAZINE) 10 MG tablet TAKE 1 TABLET BY MOUTH EVERY 8 HOURS AS NEEDED FOR NAUSEA AND VOMITING 90 tablet 0  ? Rimegepant Sulfate (NURTEC) 75 MG TBDP Take 75 mg by mouth as needed (Take 1 tab at the earlist onset of Migraine. max 1 tab in 24 hours). 16 tablet 5  ? ?No current facility-administered medications on file prior to visit.  ? ? ?ALLERGIES: ?Allergies   ?Allergen Reactions  ? Zofran [Ondansetron Hcl] Anaphylaxis  ? Morphine And Related Itching  ? ? ?FAMILY HISTORY: ?Family History  ?Problem Relation Age of Onset  ? Diabetes Father   ? Hypertension Father   ? Hyperlipidemia Father   ? Colon polyps Father   ? Heart attack Maternal Grandmother   ? Heart attack Paternal Grandmother   ? Cervical cancer Maternal Aunt   ? Stomach cancer Maternal Aunt   ? Throat cancer Maternal Uncle   ? Stroke Maternal Uncle   ? Down syndrome Son   ? Colon cancer Sister   ? Esophageal cancer Neg Hx   ? Rectal cancer Neg Hx   ? ? ?  ?Objective:  ?Blood pressure 132/76, pulse (!) 121, height 5' 4"  (1.626 m), weight 108 lb 12.8 oz (49.4 kg), SpO2 100 %. ?General: No acute distress.  Patient appears well-groomed.   ?Head:  Normocephalic/atraumatic ?Eyes:  Fundi examined but not visualized ?Neck: supple, no paraspinal tenderness, full range of motion ?Heart:  Regular rate and rhythm ?Lungs:  Clear to auscultation bilaterally ?Back: No paraspinal tenderness ?Neurological Exam: alert and oriented to person, place, and time.  Speech fluent and not dysarthric, language intact.  CN II-XII intact. Bulk and tone normal, muscle strength 5/5 throughout.  Sensation to light touch intact.  Deep tendon reflexes 2+ throughout, toes downgoing.  Finger to nose testing intact.  Gait normal, Romberg negative. ? ? ?Metta Clines, DO ? ?CC: Lyndee Hensen, DO ? ? ? ? ? ? ?

## 2022-01-15 ENCOUNTER — Other Ambulatory Visit: Payer: Self-pay

## 2022-01-15 ENCOUNTER — Encounter: Payer: Self-pay | Admitting: Neurology

## 2022-01-15 ENCOUNTER — Ambulatory Visit: Payer: Medicaid Other | Admitting: Neurology

## 2022-01-15 VITALS — BP 132/76 | HR 121 | Ht 64.0 in | Wt 108.8 lb

## 2022-01-15 DIAGNOSIS — G08 Intracranial and intraspinal phlebitis and thrombophlebitis: Secondary | ICD-10-CM | POA: Diagnosis not present

## 2022-01-15 DIAGNOSIS — I671 Cerebral aneurysm, nonruptured: Secondary | ICD-10-CM | POA: Diagnosis not present

## 2022-01-15 DIAGNOSIS — G43009 Migraine without aura, not intractable, without status migrainosus: Secondary | ICD-10-CM

## 2022-01-15 MED ORDER — NORTRIPTYLINE HCL 50 MG PO CAPS: 50.0000 mg | ORAL_CAPSULE | Freq: Every day | ORAL | 5 refills | Status: DC

## 2022-01-15 MED ORDER — NURTEC 75 MG PO TBDP: 75.0000 mg | ORAL_TABLET | ORAL | 5 refills | Status: DC | PRN

## 2022-01-15 NOTE — Progress Notes (Unsigned)
Allison Leonard Key: [id number] - PA Case ID: [id number] - Rx #: [id number] Need help? Call us at [phone] ?Outcome ?Approvedtoday ?PA Case: [id number], Status: Approved, Coverage Starts on: 01/15/2022 12:00:00 AM, Coverage Ends on: 01/15/2023 12:00:00 AM. ?Drug ?Nurtec 75MG dispersible tablets ?Form ?[employer] Electronic [place] Form [phone] NCPDP) ?Original Claim Info. ? ?Patient and pharmacy made aware.  ?

## 2022-01-15 NOTE — Progress Notes (Signed)
Medication Samples have been provided to the patient. ? ?Drug name: Nurtec       Strength: 75 mg        Qty: 3   LOT: [id number]  Exp.Date: 07/2023 ? ?Dosing instructions: as needed ? ?The patient has been instructed regarding the correct time, dose, and frequency of taking this medication, including desired effects and most common side effects.  ? ?Venetia Night ?1:40 PM ?01/15/2022  ?

## 2022-01-15 NOTE — Patient Instructions (Addendum)
Increase nortriptyline to 75m at bedtime ?Nurtec -  ?Follow up 5 months. ?

## 2022-01-18 ENCOUNTER — Ambulatory Visit: Payer: Medicaid Other | Admitting: Cardiovascular Disease

## 2022-01-18 ENCOUNTER — Ambulatory Visit (HOSPITAL_COMMUNITY)
Admission: RE | Admit: 2022-01-18 | Discharge: 2022-01-18 | Disposition: A | Discharge status: Home / Self Care | Payer: Medicaid Other | Source: Ambulatory Visit | Attending: Family Medicine | Admitting: Family Medicine

## 2022-01-18 ENCOUNTER — Other Ambulatory Visit: Payer: Self-pay

## 2022-01-18 DIAGNOSIS — I080 Rheumatic disorders of both mitral and aortic valves: Secondary | ICD-10-CM | POA: Diagnosis not present

## 2022-01-18 DIAGNOSIS — I351 Nonrheumatic aortic (valve) insufficiency: Secondary | ICD-10-CM | POA: Diagnosis not present

## 2022-01-18 DIAGNOSIS — R000 Tachycardia, unspecified: Secondary | ICD-10-CM

## 2022-01-18 DIAGNOSIS — I340 Nonrheumatic mitral (valve) insufficiency: Secondary | ICD-10-CM

## 2022-01-18 DIAGNOSIS — I361 Nonrheumatic tricuspid (valve) insufficiency: Secondary | ICD-10-CM

## 2022-01-18 DIAGNOSIS — I472 Ventricular tachycardia, unspecified: Secondary | ICD-10-CM | POA: Diagnosis not present

## 2022-01-18 LAB — ECHOCARDIOGRAM COMPLETE
AR max vel: 3.38 cm2
AV Peak grad: 5.8 mmHg
Ao pk vel: 1.2 m/s
Area-P 1/2: 5.79 cm2
Calc EF: 38.6 %
MV M vel: 4.35 m/s
MV Peak grad: 75.7 mmHg
S' Lateral: 4.1 cm
Single Plane A2C EF: 44.6 %
Single Plane A4C EF: 33.2 %

## 2022-01-25 ENCOUNTER — Ambulatory Visit: Payer: Medicaid Other | Admitting: Cardiology

## 2022-01-28 NOTE — Progress Notes (Signed)
?Cardiology Office Note:   ?Date:  01/29/2022  ?NAME:  Allison Leonard    ?MRN: [id number] ?DOB:  [birth year]  ? ?PCP:  Lyndee Hensen, DO  ?Cardiologist:  None  ?Electrophysiologist:  None  ? ?Referring MD: Lyndee Hensen, DO  ? ?Chief Complaint  ?Patient presents with  ? systolic heart failure  ? ? ?History of Present Illness:   ?Allison Leonard is a 46 y.o. female with a hx of migraine, R ICA aneurysm, migraine who is being seen today for the evaluation of systolic HF at the request of Lyndee Hensen, DO.  She apparently has had sinus tachycardia for several months.  Echocardiogram was ordered.  I reviewed this.  EF was interpreted as 40-45%.  On my review her echo is 50-55% with normal diastolic parameters.  This is inconsistent with diagnosis of heart failure.  She does report some shortness of breath but I suspect this is smoking-related.  She continues to smoke 4 to 5 cigarettes/day.  She is smoked for 11 years.  She has no evidence of volume overload.  Recent BNP value is normal.  She does report she has had a hysterectomy and is having hot flashes.  I wonder if this is menopause related.  She denies any chest pain.  She has never had a heart attack.  Recently had a right internal carotid aneurysm that underwent embolization.  She does report family history of heart disease in her father but she is unclear of the details.  She had a son with tetralogy of Fallot.  This was repaired at [facility].  She underwent a recent CT scan which shows no evidence of pulmonary embolism.  She actually has no evidence of coronary calcium either.  Her weight is 109 pounds.  BMI 18.7.  She reports no weight loss.  She is not bleeding.  Most recent hemoglobin value on the low side 12.5.  She has normal kidney function.  She has not had a thyroid test in over 3 years.  Cardiovascular exam is really normal.  I am unsure if she actually has an EF of 40 to 45%.  Again on my review it appears normal. ? ?Normal diastolic  parameters. BNP 34. Tropoinin 5  ? ?Problem List ?Systolic HF ?-EF 38-45% ?2. R ICA Aneurysm  ?-s/p embolization 11/2021 ?3.  Mild mitral valve regurgitation/mild aortic regurgitation ? ?Past Medical History: ?Past Medical History:  ?Diagnosis Date  ? Acid reflux 07/04/2018  ? Anemia   ? Brain aneurysm 2022  ? Celiac disease/sprue   ? Cyclic vomiting syndrome   ? Dyspnea   ? in past per patient 11/24/21  ? Generalized anxiety disorder 07/04/2018  ? History of colon polyps   ? last colonoscopy 2018 removed multiple benign polyps  ? Migraines 07/04/2018  ? ? ?Past Surgical History: ?Past Surgical History:  ?Procedure Laterality Date  ? ABDOMINAL HYSTERECTOMY  2006  ? APPENDECTOMY  1999  ? COLONOSCOPY  2018  ? ESOPHAGOGASTRODUODENOSCOPY  2018  ? IR 3D INDEPENDENT WKST  09/01/2021  ? IR ANGIO INTRA EXTRACRAN SEL COM CAROTID INNOMINATE BILAT MOD SED  09/01/2021  ? IR ANGIO INTRA EXTRACRAN SEL INTERNAL CAROTID UNI R MOD SED  11/29/2021  ? IR ANGIO VERTEBRAL SEL VERTEBRAL BILAT MOD SED  09/01/2021  ? IR ANGIOGRAM FOLLOW UP STUDY  11/29/2021  ? IR CT HEAD LTD  11/29/2021  ? IR RADIOLOGIST EVAL & MGMT  08/21/2021  ? IR RADIOLOGIST EVAL & MGMT  09/07/2021  ? IR RADIOLOGIST EVAL &  MGMT  12/19/2021  ? IR TRANSCATH/EMBOLIZ  11/27/2021  ? RADIOLOGY WITH ANESTHESIA N/A 11/27/2021  ? Procedure: EMBLOIZATION;  Surgeon: Luanne Bras, MD;  Location: [place];  Service: Radiology;  Laterality: N/A;  ? ? ?Current Medications: ?Current Meds  ?Medication Sig  ? clopidogrel (PLAVIX) 75 MG tablet Take 1 tablet (75 mg total) by mouth daily. (Patient taking differently: Take 37.5 mg by mouth daily.)  ? famotidine (PEPCID) 20 MG tablet Take 1 tablet (20 mg total) by mouth 2 (two) times daily.  ? nortriptyline (PAMELOR) 50 MG capsule Take 1 capsule (50 mg total) by mouth at bedtime.  ? Plecanatide (TRULANCE) 3 MG TABS Take 3 mg by mouth daily.  ? prochlorperazine (COMPAZINE) 10 MG tablet TAKE 1 TABLET BY MOUTH EVERY 8 HOURS AS NEEDED FOR  NAUSEA AND VOMITING  ? Rimegepant Sulfate (NURTEC) 75 MG TBDP Take 75 mg by mouth as needed (Take 1 tab at the earlist onset of Migraine. max 1 tab in 24 hours).  ?  ? ?Allergies:    ?Zofran [ondansetron hcl] and Morphine and related  ? ?Social History: ?Social History  ? ?Socioeconomic History  ? Marital status: Single  ?  Spouse name: Not on file  ? Number of children: 3  ? Years of education: Not on file  ? Highest education level: Not on file  ?Occupational History  ? Not on file  ?Tobacco Use  ? Smoking status: Every Day  ?  Packs/day: 0.50  ?  Years: 10.00  ?  Pack years: 5.00  ?  Types: Cigarettes  ? Smokeless tobacco: Never  ?Vaping Use  ? Vaping Use: Never used  ?Substance and Sexual Activity  ? Alcohol use: Not Currently  ? Drug use: Not Currently  ?  Frequency: 1.0 times per week  ?  Comment: Last use 11/18/21  ? Sexual activity: Not on file  ?  Comment: Hysterectomy  ?Other Topics Concern  ? Not on file  ?Social History Narrative  ? Right handed  ? One  Story  ? Drinks coffee occasionally  ? ?Social Determinants of Health  ? ?Financial Resource Strain: Not on file  ?Food Insecurity: Not on file  ?Transportation Needs: Not on file  ?Physical Activity: Not on file  ?Stress: Not on file  ?Social Connections: Not on file  ?  ? ?Family History: ?The patient's family history includes Cervical cancer in her maternal aunt; Colon cancer in her sister; Colon polyps in her father; Diabetes in her father; Down syndrome in her son; Heart attack in her maternal grandmother and paternal grandmother; Heart disease in her father; Hyperlipidemia in her father; Hypertension in her father; Stomach cancer in her maternal aunt; Stroke in her maternal uncle; Throat cancer in her maternal uncle. There is no history of Esophageal cancer or Rectal cancer. ? ?ROS:   ?All other ROS reviewed and negative. Pertinent positives noted in the HPI.    ? ?EKGs/Labs/Other Studies Reviewed:   ?The following studies were personally reviewed by  me today: ? ?EKG:  EKG is ordered today.  The ekg ordered today demonstrates sinus tachycardia heart rate 103, biatrial enlargement, and was personally reviewed by me.  ? ?TTE 01/18/2022 ? 1. Left ventricular ejection fraction, by estimation, is 40 to 45%. The  ?left ventricle has mildly decreased function. The left ventricle  ?demonstrates global hypokinesis. Left ventricular diastolic parameters are  ?consistent with Grade I diastolic  ?dysfunction (impaired relaxation).  ? 2. Right ventricular systolic function is normal. The right  ventricular  ?size is normal. There is normal pulmonary artery systolic pressure. The  ?estimated right ventricular systolic pressure is 16.1 mmHg.  ? 3. The mitral valve is abnormal. Mild mitral valve regurgitation.  ? 4. The aortic valve is tricuspid. Aortic valve regurgitation is mild.  ?Aortic valve sclerosis is present, with no evidence of aortic valve  ?stenosis.  ? 5. The inferior vena cava is normal in size with <50% respiratory  ?variability, suggesting right atrial pressure of 8 mmHg.  ? ?Recent Labs: ?03/10/2021: ALT 12 ?11/28/2021: Magnesium 2.7 ?01/04/2022: B Natriuretic Peptide 34.0; BUN 19; Creatinine, Ser 0.58; Hemoglobin 12.5; Platelets 252; Potassium 3.5; Sodium 133  ? ?Recent Lipid Panel ?No results found for: CHOL, TRIG, HDL, CHOLHDL, VLDL, LDLCALC, LDLDIRECT ? ?Physical Exam:   ?VS:  BP 118/86   Pulse (!) 103   Ht 5' 4"  (1.626 m)   Wt 109 lb (49.4 kg)   SpO2 100%   BMI 18.71 kg/m?    ?Wt Readings from Last 3 Encounters:  ?01/29/22 109 lb (49.4 kg)  ?01/15/22 108 lb 12.8 oz (49.4 kg)  ?01/04/22 112 lb (50.8 kg)  ?  ?General: Well nourished, well developed, in no acute distress ?Head: Atraumatic, normal size  ?Eyes: PEERLA, EOMI  ?Neck: Supple, no JVD ?Endocrine: No thryomegaly ?Cardiac: Normal S1, S2; RRR; no murmurs, rubs, or gallops ?Lungs: Clear to auscultation bilaterally, no wheezing, rhonchi or rales  ?Abd: Soft, nontender, no hepatomegaly  ?Ext: No edema,  pulses 2+ ?Musculoskeletal: No deformities, BUE and BLE strength normal and equal ?Skin: Warm and dry, no rashes   ?Neuro: Alert and oriented to person, place, time, and situation, CNII-XII grossly intact, no focal

## 2022-01-29 ENCOUNTER — Ambulatory Visit: Payer: Medicaid Other | Admitting: Cardiovascular Disease

## 2022-01-29 ENCOUNTER — Other Ambulatory Visit: Payer: Self-pay

## 2022-01-29 ENCOUNTER — Encounter: Payer: Self-pay | Admitting: Cardiovascular Disease

## 2022-01-29 VITALS — BP 118/86 | HR 103 | Ht 64.0 in | Wt 109.0 lb

## 2022-01-29 DIAGNOSIS — I5022 Chronic systolic (congestive) heart failure: Secondary | ICD-10-CM | POA: Diagnosis not present

## 2022-01-29 DIAGNOSIS — Z720 Tobacco use: Secondary | ICD-10-CM | POA: Diagnosis not present

## 2022-01-29 DIAGNOSIS — R000 Tachycardia, unspecified: Secondary | ICD-10-CM

## 2022-01-29 NOTE — Patient Instructions (Signed)
Medication Instructions:  ?The current medical regimen is effective;  continue present plan and medications. ? ?*If you need a refill on your cardiac medications before your next appointment, please call your pharmacy* ? ? ?Lab Work: ?CBC, BMET, BNP today  ? ?If you have labs (blood work) drawn today and your tests are completely normal, you will receive your results only by: ?MyChart Message (if you have MyChart) OR ?A paper copy in the mail ?If you have any lab test that is abnormal or we need to change your treatment, we will call you to review the results. ? ? ?Testing/Procedures: ?Your physician has requested that you have a cardiac MRI. Cardiac MRI uses a computer to create images of your heart as its beating, producing both still and moving pictures of your heart and major blood vessels. For further information please visit [email or web address] Please follow the instruction sheet given to you today for more information.  ? ? ?Follow-Up: ?At [facility], you and your health needs are our priority.  As part of our continuing mission to provide you with exceptional heart care, we have created designated Provider Care Teams.  These Care Teams include your primary Cardiologist (physician) and Advanced Practice Providers (APPs -  Physician Assistants and Nurse Practitioners) who all work together to provide you with the care you need, when you need it. ? ?We recommend signing up for the patient portal called "MyChart".  Sign up information is provided on this After Visit Summary.  MyChart is used to connect with patients for Virtual Visits (Telemedicine).  Patients are able to view lab/test results, encounter notes, upcoming appointments, etc.  Non-urgent messages can be sent to your provider as well.   ?To learn more about what you can do with MyChart, go to [email or web address].   ? ?Your next appointment:   ?3-4 month(s) ? ?The format for your next appointment:   ?In Person ? ?Provider:   ?Eleonore Chiquito, MD   ? ? ?

## 2022-01-30 LAB — CBC
Hematocrit: 40.1 % (ref 34.0–46.6)
Hemoglobin: 13.1 g/dL (ref 11.1–15.9)
MCH: 26.9 pg (ref 26.6–33.0)
MCHC: 32.7 g/dL (ref 31.5–35.7)
MCV: 82 fL (ref 79–97)
Platelets: 268 10*3/uL (ref 150–450)
RBC: 4.87 x10E6/uL (ref 3.77–5.28)
RDW: 12.5 % (ref 11.7–15.4)
WBC: 9 10*3/uL (ref 3.4–10.8)

## 2022-01-30 LAB — BRAIN NATRIURETIC PEPTIDE: BNP: 11.5 pg/mL (ref 0.0–100.0)

## 2022-01-30 LAB — TSH: TSH: 0.459 u[IU]/mL (ref 0.450–4.500)

## 2022-02-02 ENCOUNTER — Other Ambulatory Visit: Payer: Self-pay | Admitting: Family Medicine

## 2022-02-02 DIAGNOSIS — R1115 Cyclical vomiting syndrome unrelated to migraine: Secondary | ICD-10-CM

## 2022-02-12 ENCOUNTER — Ambulatory Visit: Payer: Medicaid Other | Admitting: Cardiology

## 2022-02-14 ENCOUNTER — Other Ambulatory Visit: Payer: Self-pay | Admitting: Neurology

## 2022-03-02 ENCOUNTER — Ambulatory Visit (HOSPITAL_COMMUNITY): Payer: Medicaid Other

## 2022-03-20 ENCOUNTER — Other Ambulatory Visit (HOSPITAL_COMMUNITY): Payer: Self-pay | Admitting: Radiology

## 2022-03-20 MED ORDER — CLOPIDOGREL BISULFATE 75 MG PO TABS: 37.5000 mg | ORAL_TABLET | Freq: Every day | ORAL | 0 refills | Status: DC

## 2022-03-29 ENCOUNTER — Telehealth (HOSPITAL_COMMUNITY): Payer: Self-pay | Admitting: *Deleted

## 2022-03-29 NOTE — Telephone Encounter (Signed)
Reaching out to patient to offer assistance regarding upcoming cardiac imaging study; pt verbalizes understanding of appt date/time, parking situation and where to check in, and verified current allergies; name and call back number provided for further questions should they arise  Gordy Clement RN [facility] [phone] office [phone] cell  Denies metal or claustrophobia. States they some times can't find her veins for an IV.

## 2022-03-30 ENCOUNTER — Ambulatory Visit (HOSPITAL_COMMUNITY)
Admission: RE | Admit: 2022-03-30 | Discharge: 2022-03-30 | Disposition: A | Discharge status: Home / Self Care | Payer: Medicaid Other | Source: Ambulatory Visit | Attending: Cardiovascular Disease | Admitting: Cardiovascular Disease

## 2022-03-30 ENCOUNTER — Encounter (HOSPITAL_COMMUNITY): Payer: Self-pay

## 2022-03-30 DIAGNOSIS — I5022 Chronic systolic (congestive) heart failure: Secondary | ICD-10-CM

## 2022-04-05 ENCOUNTER — Other Ambulatory Visit: Payer: Self-pay

## 2022-04-05 DIAGNOSIS — R1115 Cyclical vomiting syndrome unrelated to migraine: Secondary | ICD-10-CM

## 2022-04-05 MED ORDER — PROCHLORPERAZINE MALEATE 10 MG PO TABS: ORAL_TABLET | ORAL | 0 refills | Status: DC

## 2022-04-21 ENCOUNTER — Other Ambulatory Visit: Payer: Self-pay | Admitting: Family Medicine

## 2022-04-21 DIAGNOSIS — R1115 Cyclical vomiting syndrome unrelated to migraine: Secondary | ICD-10-CM

## 2022-04-30 ENCOUNTER — Other Ambulatory Visit (HOSPITAL_COMMUNITY): Payer: Self-pay | Admitting: Interventional Radiology

## 2022-04-30 DIAGNOSIS — I671 Cerebral aneurysm, nonruptured: Secondary | ICD-10-CM

## 2022-05-02 NOTE — Progress Notes (Deleted)
Cardiology Office Note:   Date:  05/02/2022  NAME:  Allison Leonard    MRN: [id number] DOB:  Nov 07, 1975   PCP:  Allison Hensen, DO  Cardiologist:  None  Electrophysiologist:  None   Referring MD: Allison Hensen, DO   No chief complaint on file. ***  History of Present Illness:   Allison Leonard is a 46 y.o. female with a hx of tobacco abuse, r ica aneurysm, systolic CHF who presents for follow-up.   Problem List Systolic HF -EF 44-81% 2. 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  Mild mitral valve regurgitation/mild aortic regurgitation 4. Tobacco abuse   Past Medical History: Past Medical History:  Diagnosis Date   Acid reflux 07/04/2018   Anemia    Brain aneurysm 2022   Celiac disease/sprue    Cyclic vomiting syndrome    Dyspnea    in past per patient 11/24/21   Generalized anxiety disorder 07/04/2018   History of colon polyps    last colonoscopy 2018 removed multiple benign polyps   Migraines 07/04/2018    Past Surgical History: Past Surgical History:  Procedure Laterality Date   ABDOMINAL HYSTERECTOMY  2006   APPENDECTOMY  1999   COLONOSCOPY  2018   ESOPHAGOGASTRODUODENOSCOPY  2018   IR 3D INDEPENDENT WKST  09/01/2021   IR ANGIO INTRA EXTRACRAN SEL COM CAROTID INNOMINATE BILAT MOD SED  09/01/2021   IR ANGIO INTRA EXTRACRAN SEL INTERNAL CAROTID UNI R MOD SED  11/29/2021   IR ANGIO VERTEBRAL SEL VERTEBRAL BILAT MOD SED  09/01/2021   IR ANGIOGRAM FOLLOW UP STUDY  11/29/2021   IR CT HEAD LTD  11/29/2021   IR RADIOLOGIST EVAL & MGMT  08/21/2021   IR RADIOLOGIST EVAL & MGMT  09/07/2021   IR RADIOLOGIST EVAL & MGMT  12/19/2021   IR TRANSCATH/EMBOLIZ  11/27/2021   RADIOLOGY WITH ANESTHESIA N/A 11/27/2021   Procedure: Allison Leonard;  Surgeon: Luanne Bras, MD;  Location: [place];  Service: Radiology;  Laterality: N/A;    Current Medications: No outpatient medications have been marked as taking for the 05/03/22 encounter (Appointment) with  Leonard, Allison Freer, MD.     Allergies:    Zofran Alvis Lemmings hcl] and Morphine and related   Social History: Social History   Socioeconomic History   Marital status: Single    Spouse name: Not on file   Number of children: 3   Years of education: Not on file   Highest education level: Not on file  Occupational History   Not on file  Tobacco Use   Smoking status: Every Day    Packs/day: 0.50    Years: 10.00    Total pack years: 5.00    Types: Cigarettes   Smokeless tobacco: Never  Vaping Use   Vaping Use: Never used  Substance and Sexual Activity   Alcohol use: Not Currently   Drug use: Not Currently    Frequency: 1.0 times per week    Comment: Last use 11/18/21   Sexual activity: Not on file    Comment: Hysterectomy  Other Topics Concern   Not on file  Social History Narrative   Right handed   One  Story   Drinks coffee occasionally   Social Determinants of Health   Financial Resource Strain: Not on file  Food Insecurity: Not on file  Transportation Needs: Not on file  Physical Activity: Not on file  Stress: Not on file  Social Connections: Not on file     Family History: The  patient's ***family history includes Cervical cancer in her maternal aunt; Colon cancer in her sister; Colon polyps in her father; Diabetes in her father; Down syndrome in her son; Heart attack in her maternal grandmother and paternal grandmother; Heart disease in her father; Hyperlipidemia in her father; Hypertension in her father; Stomach cancer in her maternal aunt; Stroke in her maternal uncle; Throat cancer in her maternal uncle. There is no history of Esophageal cancer or Rectal cancer.  ROS:   All other ROS reviewed and negative. Pertinent positives noted in the HPI.     EKGs/Labs/Other Studies Reviewed:   The following studies were personally reviewed by me today:  EKG:  EKG is *** ordered today.  The ekg ordered today demonstrates ***, and was personally reviewed by me.    TTE 01/18/2022  1. Left ventricular ejection fraction, by estimation, is 40 to 45%. The  left ventricle has mildly decreased function. The left ventricle  demonstrates global hypokinesis. Left ventricular diastolic parameters are  consistent with Grade I diastolic  dysfunction (impaired relaxation).   2. Right ventricular systolic function is normal. The right ventricular  size is normal. There is normal pulmonary artery systolic pressure. The  estimated right ventricular systolic pressure is 34.1 mmHg.   3. The mitral valve is abnormal. Mild mitral valve regurgitation.   4. The aortic valve is tricuspid. Aortic valve regurgitation is mild.  Aortic valve sclerosis is present, with no evidence of aortic valve  stenosis.   5. The inferior vena cava is normal in size with <50% respiratory  variability, suggesting right atrial pressure of 8 mmHg.   Recent Labs: 11/28/2021: Magnesium 2.7 01/04/2022: BUN 19; Creatinine, Ser 0.58; Potassium 3.5; Sodium 133 01/29/2022: BNP 11.5; Hemoglobin 13.1; Platelets 268; TSH 0.459   Recent Lipid Panel No results found for: "CHOL", "TRIG", "HDL", "CHOLHDL", "VLDL", "LDLCALC", "LDLDIRECT"  Physical Exam:   VS:  There were no vitals taken for this visit.   Wt Readings from Last 3 Encounters:  01/29/22 109 lb (49.4 kg)  01/15/22 108 lb 12.8 oz (49.4 kg)  01/04/22 112 lb (50.8 kg)    General: Well nourished, well developed, in no acute distress Head: Atraumatic, normal size  Eyes: PEERLA, EOMI  Neck: Supple, no JVD Endocrine: No thryomegaly Cardiac: Normal S1, S2; RRR; no murmurs, rubs, or gallops Lungs: Clear to auscultation bilaterally, no wheezing, rhonchi or rales  Abd: Soft, nontender, no hepatomegaly  Ext: No edema, pulses 2+ Musculoskeletal: No deformities, BUE and BLE strength normal and equal Skin: Warm and dry, no rashes   Neuro: Alert and oriented to person, place, time, and situation, CNII-XII grossly intact, no focal deficits  Psych:  Normal mood and affect   ASSESSMENT:   Allison Leonard is a 46 y.o. female who presents for the following: No diagnosis found.  PLAN:   There are no diagnoses linked to this encounter.  {Are you ordering a CV Procedure (e.g. stress test, cath, DCCV, TEE, etc)?   Press F2        :[id number]}  Disposition: No follow-ups on file.  Medication Adjustments/Labs and Tests Ordered: Current medicines are reviewed at length with the patient today.  Concerns regarding medicines are outlined above.  No orders of the defined types were placed in this encounter.  No orders of the defined types were placed in this encounter.   There are no Patient Instructions on file for this visit.   Time Spent with Patient: I have spent a total of *** minutes with  patient reviewing hospital notes, telemetry, EKGs, labs and examining the patient as well as establishing an assessment and plan that was discussed with the patient.  > 50% of time was spent in direct patient care.  Signed, Addison Naegeli. Audie Box, MD, [place]  [address], [place], [place] [address] [phone]  05/02/2022 6:42 AM

## 2022-05-03 ENCOUNTER — Ambulatory Visit: Payer: Medicaid Other | Admitting: Cardiovascular Disease

## 2022-05-15 NOTE — Progress Notes (Deleted)
Cardiology Office Note:   Date:  05/15/2022  NAME:  Allison Leonard    MRN: [id number] DOB:  12-Mar-1976   PCP:  Gerrit Heck, MD  Cardiologist:  None  Electrophysiologist:  None   Referring MD: Lyndee Hensen, DO   No chief complaint on file. ***  History of Present Illness:   Allison Leonard is a 46 y.o. female with a hx of HFmEF who presents for follow-up.   Problem List Systolic HF -EF 40-98% 2. 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  Mild mitral valve regurgitation/mild aortic regurgitation  Past Medical History: Past Medical History:  Diagnosis Date   Acid reflux 07/04/2018   Anemia    Brain aneurysm 2022   Celiac disease/sprue    Cyclic vomiting syndrome    Dyspnea    in past per patient 11/24/21   Generalized anxiety disorder 07/04/2018   History of colon polyps    last colonoscopy 2018 removed multiple benign polyps   Migraines 07/04/2018    Past Surgical History: Past Surgical History:  Procedure Laterality Date   ABDOMINAL HYSTERECTOMY  2006   APPENDECTOMY  1999   COLONOSCOPY  2018   ESOPHAGOGASTRODUODENOSCOPY  2018   IR 3D INDEPENDENT WKST  09/01/2021   IR ANGIO INTRA EXTRACRAN SEL COM CAROTID INNOMINATE BILAT MOD SED  09/01/2021   IR ANGIO INTRA EXTRACRAN SEL INTERNAL CAROTID UNI R MOD SED  11/29/2021   IR ANGIO VERTEBRAL SEL VERTEBRAL BILAT MOD SED  09/01/2021   IR ANGIOGRAM FOLLOW UP STUDY  11/29/2021   IR CT HEAD LTD  11/29/2021   IR RADIOLOGIST EVAL & MGMT  08/21/2021   IR RADIOLOGIST EVAL & MGMT  09/07/2021   IR RADIOLOGIST EVAL & MGMT  12/19/2021   IR TRANSCATH/EMBOLIZ  11/27/2021   RADIOLOGY WITH ANESTHESIA N/A 11/27/2021   Procedure: Drucilla Schmidt;  Surgeon: Luanne Bras, MD;  Location: [facility];  Service: Radiology;  Laterality: N/A;    Current Medications: No outpatient medications have been marked as taking for the 05/17/22 encounter (Appointment) with O'Neal, Cassie Freer, MD.     Allergies:    Zofran  Alvis Lemmings hcl] and Morphine and related   Social History: Social History   Socioeconomic History   Marital status: Single    Spouse name: Not on file   Number of children: 3   Years of education: Not on file   Highest education level: Not on file  Occupational History   Not on file  Tobacco Use   Smoking status: Every Day    Packs/day: 0.50    Years: 10.00    Total pack years: 5.00    Types: Cigarettes   Smokeless tobacco: Never  Vaping Use   Vaping Use: Never used  Substance and Sexual Activity   Alcohol use: Not Currently   Drug use: Not Currently    Frequency: 1.0 times per week    Comment: Last use 11/18/21   Sexual activity: Not on file    Comment: Hysterectomy  Other Topics Concern   Not on file  Social History Narrative   Right handed   One  Story   Drinks coffee occasionally   Social Determinants of Health   Financial Resource Strain: Not on file  Food Insecurity: Not on file  Transportation Needs: Not on file  Physical Activity: Not on file  Stress: Not on file  Social Connections: Not on file     Family History: The patient's ***family history includes Cervical cancer in her maternal aunt;  Colon cancer in her sister; Colon polyps in her father; Diabetes in her father; Down syndrome in her son; Heart attack in her maternal grandmother and paternal grandmother; Heart disease in her father; Hyperlipidemia in her father; Hypertension in her father; Stomach cancer in her maternal aunt; Stroke in her maternal uncle; Throat cancer in her maternal uncle. There is no history of Esophageal cancer or Rectal cancer.  ROS:   All other ROS reviewed and negative. Pertinent positives noted in the HPI.     EKGs/Labs/Other Studies Reviewed:   The following studies were personally reviewed by me today:  EKG:  EKG is *** ordered today.  The ekg ordered today demonstrates ***, and was personally reviewed by me.   Recent Labs: 11/28/2021: Magnesium 2.7 01/04/2022: BUN  19; Creatinine, Ser 0.58; Potassium 3.5; Sodium 133 01/29/2022: BNP 11.5; Hemoglobin 13.1; Platelets 268; TSH 0.459   Recent Lipid Panel No results found for: "CHOL", "TRIG", "HDL", "CHOLHDL", "VLDL", "LDLCALC", "LDLDIRECT"  Physical Exam:   VS:  There were no vitals taken for this visit.   Wt Readings from Last 3 Encounters:  01/29/22 109 lb (49.4 kg)  01/15/22 108 lb 12.8 oz (49.4 kg)  01/04/22 112 lb (50.8 kg)    General: Well nourished, well developed, in no acute distress Head: Atraumatic, normal size  Eyes: PEERLA, EOMI  Neck: Supple, no JVD Endocrine: No thryomegaly Cardiac: Normal S1, S2; RRR; no murmurs, rubs, or gallops Lungs: Clear to auscultation bilaterally, no wheezing, rhonchi or rales  Abd: Soft, nontender, no hepatomegaly  Ext: No edema, pulses 2+ Musculoskeletal: No deformities, BUE and BLE strength normal and equal Skin: Warm and dry, no rashes   Neuro: Alert and oriented to person, place, time, and situation, CNII-XII grossly intact, no focal deficits  Psych: Normal mood and affect   ASSESSMENT:   Allison Leonard is a 46 y.o. female who presents for the following: No diagnosis found.  PLAN:   There are no diagnoses linked to this encounter.  {Are you ordering a CV Procedure (e.g. stress test, cath, DCCV, TEE, etc)?   Press F2        :[id number]}  Disposition: No follow-ups on file.  Medication Adjustments/Labs and Tests Ordered: Current medicines are reviewed at length with the patient today.  Concerns regarding medicines are outlined above.  No orders of the defined types were placed in this encounter.  No orders of the defined types were placed in this encounter.   There are no Patient Instructions on file for this visit.   Time Spent with Patient: I have spent a total of *** minutes with patient reviewing hospital notes, telemetry, EKGs, labs and examining the patient as well as establishing an assessment and plan that was discussed with the  patient.  > 50% of time was spent in direct patient care.  Signed, Addison Naegeli. Audie Box, MD, [place]  [address], [place], [place] [address] [phone]  05/15/2022 7:47 PM

## 2022-05-17 ENCOUNTER — Ambulatory Visit: Payer: Medicaid Other | Admitting: Cardiovascular Disease

## 2022-05-17 DIAGNOSIS — I5022 Chronic systolic (congestive) heart failure: Secondary | ICD-10-CM

## 2022-05-17 DIAGNOSIS — R000 Tachycardia, unspecified: Secondary | ICD-10-CM

## 2022-05-17 DIAGNOSIS — Z720 Tobacco use: Secondary | ICD-10-CM

## 2022-05-26 ENCOUNTER — Other Ambulatory Visit: Payer: Self-pay | Admitting: Family Medicine

## 2022-05-26 DIAGNOSIS — R1115 Cyclical vomiting syndrome unrelated to migraine: Secondary | ICD-10-CM

## 2022-05-31 ENCOUNTER — Ambulatory Visit (HOSPITAL_COMMUNITY): Payer: Medicaid Other

## 2022-06-01 ENCOUNTER — Ambulatory Visit (HOSPITAL_COMMUNITY)
Admission: RE | Admit: 2022-06-01 | Discharge: 2022-06-01 | Disposition: A | Discharge status: Home / Self Care | Payer: Medicaid Other | Source: Ambulatory Visit | Attending: Interventional Radiology | Admitting: Interventional Radiology

## 2022-06-01 DIAGNOSIS — I671 Cerebral aneurysm, nonruptured: Secondary | ICD-10-CM | POA: Diagnosis not present

## 2022-06-06 ENCOUNTER — Telehealth (HOSPITAL_COMMUNITY): Payer: Self-pay

## 2022-06-06 ENCOUNTER — Telehealth (HOSPITAL_COMMUNITY): Payer: Self-pay | Admitting: Student

## 2022-06-06 NOTE — Telephone Encounter (Signed)
Patient to be scheduled for 6 month follow up MRA. She currently complains of right-sided/back of head headache that she describes as pounding. She states the pain is consistent with what she has experienced in the past. MRA 06/01/22 showed no acute findings.  She follows with neurology for migraines. I encouraged her to reach out to her neurologist for an evaluation and possible medication adjustment. She was in agreement with this recommendation. She was also encouraged to call NIR back if there are any other issues/complaints we might be able to assist with.  Soyla Dryer, [place] [phone] 06/06/2022, 9:57 AM

## 2022-06-06 NOTE — Telephone Encounter (Signed)
Pt agreed to f/u in 6 months with an mra. She is concerned about her frequent pounding headaches. I will send a message to our PA to advise. AW

## 2022-06-12 ENCOUNTER — Other Ambulatory Visit (HOSPITAL_COMMUNITY): Payer: Self-pay

## 2022-06-23 ENCOUNTER — Other Ambulatory Visit: Payer: Self-pay | Admitting: Student

## 2022-06-23 DIAGNOSIS — R1115 Cyclical vomiting syndrome unrelated to migraine: Secondary | ICD-10-CM

## 2022-06-26 ENCOUNTER — Telehealth: Payer: Self-pay

## 2022-06-26 NOTE — Telephone Encounter (Signed)
-----   Message from Gerrit Heck, MD sent at 06/25/2022  9:10 AM EDT ----- Can we schedule patient for follow up with compazine use? Thank you  Mayuri

## 2022-06-26 NOTE — Telephone Encounter (Signed)
Scheduled pt for 09/08 @2 :45 pm with Dr.Jagadish

## 2022-06-26 NOTE — Telephone Encounter (Signed)
@  1:55 pm* correction

## 2022-07-04 NOTE — Progress Notes (Signed)
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  Continue nortriptyline for now (will discontinue once she starts the Aimovig) Continue Nurtec for rescue medication.  Triptans are contraindicated.  Ubrelvy ineffective. Limit use of pain relievers to no more than 2 days out of week to prevent risk of rebound or medication-overuse headache. Keep headache diary Follow up 4 months.     Subjective:  Allison S. HBlossomis a 46year old female with Celiac disease and Cyclic Vomiting Syndrome who follows up for migraines.   UPDATE: Last visit, increased nortriptyline. Intensity:  8-8.5/10 Duration:  all day.  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    She had been experiencing sinus tachycardia.  Echo revealed EF 40-45%.  Referred to  cardiology for possible heart failure, who reviewed echo and believed the EF was actually within normal range of 50-55%.       Frequency:  13 headaches in last 30 days Current NSAIDS:  none Current analgesics:  none Current triptans:  contraindicated - cerebral aneurysms, history of cerebral venous thrombosis Current ergotamine:  none Current anti-emetic:  none Current muscle relaxants:  none Current sleep aide:  none Current Antihypertensive medications:  none Current Antidepressant medications:  nortriptyline 253mQHS Current  Anticonvulsant medications: none Current anti-CGRP:  none Current Vitamins/Herbal/Supplements:  none Current Antihistamines/Decongestants:  none Other therapy:  none Hormone/birth control:  none   Caffeine:  Coffee 1 to 2 times a month.  No soda Exercise:  no Depression:  no; Anxiety:  no Other pain:  Sometimes abdominal discomfort Sleep hygiene:  Restless   HISTORY:  Started having headaches at age 46 Worse over the past 3 to 4 years.  They are mostly severe sharp or pounding headache in her right temple.  Usually lasts a day.  Sometimes they wake her up at night.  17 days over last 30 days.  Nausea, vomiting, photophobia, phonophobia, blurred vision.  No numbness or weakness.  No specific triggers.  Vomiting may help make it better.  MRI brain with and without contrast on 06/03/2020 personally reviewed showed no acute intracranial abnormality but did show chronic nonocclusive thrombus in the superior sagittal sinus; 1-3 mm outpouchings involving the right cavernous ICA, left ICA terminus, proximal basilar artery and right P1 segment; and mild to moderate bilateral A1 segment narrowing.  Follow up CTA of head on 06/20/2020 personally reviewed redemonstrated 1-2 mm distal cavernous/paraclinoid right ICA aneurysm and 2 mm proximal basilar artery aneurysm but previously seen small aneurysms of the right P1 PCA and left ICA terminus not appreciated.  She was referred to interventional radiology at that time but she never received a call.  Hypercoagulable panel including Factor V Leiden gene mutation, from June and August 2021 was negative.  Repeat CTA head performed 08/02/2021 personally reviewed again demonstrated 1-2 mm right paraclinoid ICA aneurysm vs infundibulum and 2 mm proximal basilar artery aneurysm vs fenestration, unchanged from prior study.  She was referred to endovascular  interventional radiology.  On 11/27/2021, she underwent embolization with pipeline shield flow diverter device for the  right ICA aneurysm.  She was discharged but she had a syncopal episode where she developed a migraine afterwards, prompting her to go to the ED.  CTA head and neck personally reviewed revealed no postembolization complications.  She was treated with a headache cocktail.     Past NSAIDS:  ibuprofen Past analgesics:  Fioricet, Excedrin Past abortive triptans:  sumatriptan 1315m Past abortive ergotamine:  none Past muscle relaxants:  none Past anti-emetic:  Compazine, Reglan 168m promethazine 12.15m64mast antihypertensive medications:  none Past antidepressant medications:  none Past anticonvulsant medications:  topiramate Past anti-CGRP:  UbrRoselyn Meierst vitamins/Herbal/Supplements:  none Past antihistamines/decongestants:  Benadryl Other past therapies:  none   Family history of headache:  Brother, sister, mother, daughter  PAST MEDICAL HISTORY: Past Medical History:  Diagnosis Date   Acid reflux 07/04/2018   Anemia    Brain aneurysm 2022   Celiac disease/sprue    Cyclic vomiting syndrome    Dyspnea    in past per patient 11/24/21   Generalized anxiety disorder 07/04/2018   History of colon polyps    last colonoscopy 2018 removed multiple benign polyps   Migraines 07/04/2018    MEDICATIONS: Current Outpatient Medications on File Prior to Visit  Medication Sig Dispense Refill   clopidogrel (PLAVIX) 75 MG tablet Take 0.5 tablets (37.5 mg total) by mouth daily. 90 tablet 0   dicyclomine (BENTYL) 20 MG tablet Take 1 tablet (20 mg total) by mouth 3 (three) times daily as needed for spasms. (Patient not taking: Reported on 01/29/2022) 30 tablet 11   famotidine (PEPCID) 20 MG tablet Take 1 tablet (20 mg total) by mouth 2 (two) times daily. 180 tablet 3   nortriptyline (PAMELOR) 50 MG capsule TAKE 1 CAPSULE BY MOUTH AT BEDTIME. 90 capsule 2   Plecanatide (TRULANCE) 3 MG TABS Take 3 mg by mouth daily. 30 tablet 11   prochlorperazine (COMPAZINE) 10 MG tablet TAKE 1 TABLET BY MOUTH EVERY 8  HOURS AS NEEDED FOR NAUSEA AND VOMITING 30 tablet 0   Rimegepant Sulfate (NURTEC) 75 MG TBDP Take 75 mg by mouth as needed (Take 1 tab at the earlist onset of Migraine. max 1 tab in 24 hours). 16 tablet 5   No current facility-administered medications on file prior to visit.    ALLERGIES: Allergies  Allergen Reactions   Zofran [Ondansetron Hcl] Anaphylaxis   Morphine And Related Itching    FAMILY HISTORY: Family History  Problem Relation Age of Onset   Heart disease Father    Diabetes Father    Hypertension Father    Hyperlipidemia Father    Colon polyps Father    Colon cancer Sister    Cervical cancer Maternal Aunt    Stomach cancer Maternal Aunt    Throat cancer Maternal Uncle    Stroke Maternal Uncle    Heart attack Maternal Grandmother    Heart attack Paternal Grandmother    Down syndrome Son    Esophageal cancer Neg Hx    Rectal cancer Neg Hx       Objective:  Blood pressure 136/84, pulse (!) 118, height 5' 2"  (1.575 m), weight 107 lb 9.6 oz (48.8 kg), SpO2 100 %. General: No acute distress.  Patient appears well-groomed.   Head:  Normocephalic/atraumatic Eyes:  Fundi examined but not visualized Neck: supple, no paraspinal tenderness, full range of motion Heart:  Regular rate and rhythm Neurological Exam: alert and  oriented to person, place, and time.  Speech fluent and not dysarthric, language intact.  CN II-XII intact. Bulk and tone normal, muscle strength 5/5 throughout.  Sensation to light touch intact.  Deep tendon reflexes 2+ throughout, toes downgoing.  Finger to nose testing intact.  Gait normal, Romberg negative.   Metta Clines, DO  CC: Gerrit Heck, MD

## 2022-07-05 ENCOUNTER — Other Ambulatory Visit (HOSPITAL_COMMUNITY): Payer: Self-pay

## 2022-07-06 ENCOUNTER — Ambulatory Visit: Payer: Medicaid Other | Admitting: Neurology

## 2022-07-06 ENCOUNTER — Telehealth (HOSPITAL_COMMUNITY): Payer: Self-pay | Admitting: Pharmacy Technician

## 2022-07-06 ENCOUNTER — Telehealth: Payer: Self-pay

## 2022-07-06 ENCOUNTER — Encounter: Payer: Self-pay | Admitting: Neurology

## 2022-07-06 VITALS — BP 136/84 | HR 118 | Ht 62.0 in | Wt 107.6 lb

## 2022-07-06 DIAGNOSIS — I671 Cerebral aneurysm, nonruptured: Secondary | ICD-10-CM | POA: Diagnosis not present

## 2022-07-06 DIAGNOSIS — G43009 Migraine without aura, not intractable, without status migrainosus: Secondary | ICD-10-CM | POA: Diagnosis not present

## 2022-07-06 MED ORDER — NURTEC 75 MG PO TBDP: 75.0000 mg | ORAL_TABLET | ORAL | 5 refills | Status: DC | PRN

## 2022-07-06 MED ORDER — AIMOVIG 140 MG/ML ~~LOC~~ SOAJ: 140.0000 mg | SUBCUTANEOUS | 11 refills | Status: DC

## 2022-07-06 NOTE — Telephone Encounter (Signed)
Patient Advocate Encounter  Prior Authorization for Aimovig 140MG/ML auto-injectors has been approved.    PA# [id number] Key: [id number] Effective dates: 07/06/2022 through 10/04/2022      Lyndel Safe, [place] Patient Advocate Specialist [place] Patient Advocate Team Direct Number: [phone]  Fax: [phone]

## 2022-07-06 NOTE — Telephone Encounter (Signed)
Patient in office today. Per Dr.Jafe patient to start Aimovig.  PA team please start a PA for Aimovig 140 mg.

## 2022-07-06 NOTE — Patient Instructions (Signed)
Continue nortriptyline for now.  Will start Aimovig 14m injection every 28 days.  If approved, then will stop nortriptyline after you take the first dose Refilled Nurtec Follow up 4 months.

## 2022-07-11 NOTE — Progress Notes (Deleted)
    SUBJECTIVE:   CHIEF COMPLAINT / HPI:   ***  PERTINENT  PMH / PSH: ***  OBJECTIVE:   There were no vitals taken for this visit.  ***  ASSESSMENT/PLAN:   No problem-specific Assessment & Plan notes found for this encounter.     Gerrit Heck, MD [place]

## 2022-07-13 ENCOUNTER — Ambulatory Visit: Payer: Medicaid Other | Admitting: Student

## 2022-07-16 ENCOUNTER — Other Ambulatory Visit: Payer: Self-pay | Admitting: Student

## 2022-07-16 DIAGNOSIS — R1115 Cyclical vomiting syndrome unrelated to migraine: Secondary | ICD-10-CM

## 2022-08-03 ENCOUNTER — Ambulatory Visit: Payer: Medicaid Other | Admitting: Student

## 2022-08-03 NOTE — Progress Notes (Deleted)
    SUBJECTIVE:   CHIEF COMPLAINT / HPI:   HFmrEF  Tachycardia   Cannabinoid hyperemsis Currently on compazine 10 mg q8h prn  PERTINENT  PMH / PSH: hx of brain aneurysm s/p R ICA coiling, migraines  OBJECTIVE:   There were no vitals taken for this visit.  ***  ASSESSMENT/PLAN:   No problem-specific Assessment & Plan notes found for this encounter.     Gerrit Heck, MD [place]

## 2022-08-07 ENCOUNTER — Other Ambulatory Visit: Payer: Self-pay | Admitting: Student

## 2022-08-07 DIAGNOSIS — R1115 Cyclical vomiting syndrome unrelated to migraine: Secondary | ICD-10-CM

## 2022-08-27 ENCOUNTER — Ambulatory Visit: Payer: Medicaid Other | Admitting: Cardiovascular Disease

## 2022-09-14 ENCOUNTER — Other Ambulatory Visit: Payer: Self-pay | Admitting: Gastroenterology

## 2022-09-14 ENCOUNTER — Other Ambulatory Visit: Payer: Self-pay | Admitting: Neurology

## 2022-09-14 DIAGNOSIS — K5909 Other constipation: Secondary | ICD-10-CM

## 2022-09-14 DIAGNOSIS — R1013 Epigastric pain: Secondary | ICD-10-CM

## 2022-09-20 ENCOUNTER — Other Ambulatory Visit: Payer: Self-pay

## 2022-09-20 ENCOUNTER — Encounter (HOSPITAL_COMMUNITY): Payer: Self-pay

## 2022-09-20 ENCOUNTER — Emergency Department (HOSPITAL_COMMUNITY)
Admission: EM | Admit: 2022-09-20 | Discharge: 2022-09-20 | Disposition: A | Discharge status: Home / Self Care | Payer: Medicaid Other | Attending: Emergency Medicine | Admitting: Emergency Medicine

## 2022-09-20 DIAGNOSIS — I959 Hypotension, unspecified: Secondary | ICD-10-CM | POA: Diagnosis not present

## 2022-09-20 DIAGNOSIS — R110 Nausea: Secondary | ICD-10-CM | POA: Diagnosis not present

## 2022-09-20 DIAGNOSIS — R112 Nausea with vomiting, unspecified: Secondary | ICD-10-CM | POA: Diagnosis present

## 2022-09-20 DIAGNOSIS — R1084 Generalized abdominal pain: Secondary | ICD-10-CM | POA: Insufficient documentation

## 2022-09-20 DIAGNOSIS — Z7902 Long term (current) use of antithrombotics/antiplatelets: Secondary | ICD-10-CM | POA: Insufficient documentation

## 2022-09-20 DIAGNOSIS — R1115 Cyclical vomiting syndrome unrelated to migraine: Secondary | ICD-10-CM | POA: Diagnosis not present

## 2022-09-20 DIAGNOSIS — R1111 Vomiting without nausea: Secondary | ICD-10-CM | POA: Diagnosis not present

## 2022-09-20 DIAGNOSIS — T68XXXA Hypothermia, initial encounter: Secondary | ICD-10-CM | POA: Diagnosis not present

## 2022-09-20 DIAGNOSIS — N9489 Other specified conditions associated with female genital organs and menstrual cycle: Secondary | ICD-10-CM | POA: Diagnosis not present

## 2022-09-20 LAB — COMPREHENSIVE METABOLIC PANEL
ALT: 13 U/L (ref 0–44)
AST: 19 U/L (ref 15–41)
Albumin: 5 g/dL (ref 3.5–5.0)
Alkaline Phosphatase: 68 U/L (ref 38–126)
Anion gap: 16 — ABNORMAL HIGH (ref 5–15)
BUN: 14 mg/dL (ref 6–20)
CO2: 20 mmol/L — ABNORMAL LOW (ref 22–32)
Calcium: 9.9 mg/dL (ref 8.9–10.3)
Chloride: 103 mmol/L (ref 98–111)
Creatinine, Ser: 0.57 mg/dL (ref 0.44–1.00)
GFR, Estimated: >60 mL/min (ref >60)
Glucose, Bld: 116 mg/dL — ABNORMAL HIGH (ref 70–99)
Potassium: 3.7 mmol/L (ref 3.5–5.1)
Sodium: 139 mmol/L (ref 135–145)
Total Bilirubin: 1 mg/dL (ref 0.3–1.2)
Total Protein: 9.2 g/dL — ABNORMAL HIGH (ref 6.5–8.1)

## 2022-09-20 LAB — CBC WITH DIFFERENTIAL/PLATELET
Abs Immature Granulocytes: 0.03 10*3/uL (ref 0.00–0.07)
Basophils Absolute: 0.1 10*3/uL (ref 0.0–0.1)
Basophils Relative: 1 %
Eosinophils Absolute: 0.1 10*3/uL (ref 0.0–0.5)
Eosinophils Relative: 1 %
HCT: 46.2 % — ABNORMAL HIGH (ref 36.0–46.0)
Hemoglobin: 14.5 g/dL (ref 12.0–15.0)
Immature Granulocytes: 0 %
Lymphocytes Relative: 24 %
Lymphs Abs: 2.5 10*3/uL (ref 0.7–4.0)
MCH: 26.4 pg (ref 26.0–34.0)
MCHC: 31.4 g/dL (ref 30.0–36.0)
MCV: 84 fL (ref 80.0–100.0)
Monocytes Absolute: 0.5 10*3/uL (ref 0.1–1.0)
Monocytes Relative: 5 %
Neutro Abs: 7.1 10*3/uL (ref 1.7–7.7)
Neutrophils Relative %: 69 %
Platelets: 228 10*3/uL (ref 150–400)
RBC: 5.5 MIL/uL — ABNORMAL HIGH (ref 3.87–5.11)
RDW: 14 % (ref 11.5–15.5)
WBC: 10.3 10*3/uL (ref 4.0–10.5)
nRBC: 0 % (ref 0.0–0.2)

## 2022-09-20 LAB — URINALYSIS, ROUTINE W REFLEX MICROSCOPIC
Bacteria, UA: NONE SEEN
Bilirubin Urine: NEGATIVE
Glucose, UA: NEGATIVE mg/dL
Ketones, ur: 80 mg/dL — AB
Nitrite: NEGATIVE
Protein, ur: 100 mg/dL — AB
Specific Gravity, Urine: 1.026 (ref 1.005–1.030)
pH: 5 (ref 5.0–8.0)

## 2022-09-20 LAB — LIPASE, BLOOD: Lipase: 29 U/L (ref 11–51)

## 2022-09-20 LAB — HCG, QUANTITATIVE, PREGNANCY: hCG, Beta Chain, Quant, S: 6 m[IU]/mL — ABNORMAL HIGH (ref <5)

## 2022-09-20 MED ORDER — DROPERIDOL 2.5 MG/ML IJ SOLN
1.2500 mg | Freq: Once | INTRAMUSCULAR | Status: AC
  Administered 2022-09-20: 1.25 mg via INTRAVENOUS
  Filled 2022-09-20: qty 2

## 2022-09-20 MED ORDER — PROCHLORPERAZINE MALEATE 10 MG PO TABS: ORAL_TABLET | ORAL | 0 refills | Status: DC

## 2022-09-20 MED ORDER — SODIUM CHLORIDE 0.9 % IV BOLUS
1000.0000 mL | Freq: Once | INTRAVENOUS | Status: AC
  Administered 2022-09-20: 1000 mL via INTRAVENOUS

## 2022-09-20 MED ORDER — METOCLOPRAMIDE HCL 5 MG/ML IJ SOLN
10.0000 mg | Freq: Once | INTRAMUSCULAR | Status: AC
  Administered 2022-09-20: 10 mg via INTRAVENOUS
  Filled 2022-09-20: qty 2

## 2022-09-20 NOTE — ED Provider Notes (Addendum)
[facility] DEPT Provider Note   CSN: [id number] Arrival date & time: 09/20/22  0110     History  No chief complaint on file.   Allison Leonard is a 46 y.o. female.  Patient with history of cyclic vomiting, cannabinoid hyperemesis syndrome, brain aneurysm presents today with complaints of nausea, vomiting, and diarrhea. She states that same has been ongoing for the past 2 days. States that she has a history of same which was attributed to her marijuana use. States that she had not used marijuana for more than a year until last week when she was at a party and smoked marijuana. She states that her symptoms are exactly like the flairs she has had in the past. States she has been having several episodes of NBNB emesis and diarrhea since onset. Has been unable to manage with home meds. Endorses associated mild abdominal pain that is only present when she is vomiting. Denies fevers, chills, cough, congestion, shortness of breath, or chest pain. No known sick contacts. Denies hematuria or dysuria  The history is provided by the patient. No language interpreter was used.       Home Medications Prior to Admission medications   Medication Sig Start Date End Date Taking? Authorizing Provider  clopidogrel (PLAVIX) 75 MG tablet Take 0.5 tablets (37.5 mg total) by mouth daily. 03/20/22   Jacqualine Mau, NP  dicyclomine (BENTYL) 20 MG tablet Take 1 tablet (20 mg total) by mouth 3 (three) times daily as needed for spasms. 09/08/21   Thornton Park, MD  Erenumab-aooe (AIMOVIG) 140 MG/ML SOAJ Inject 140 mg into the skin every 28 (twenty-eight) days. 07/06/22   Tomi Likens, Adam R, DO  famotidine (PEPCID) 20 MG tablet TAKE 1 TABLET BY MOUTH TWICE A DAY 09/17/22   Thornton Park, MD  nortriptyline (PAMELOR) 50 MG capsule TAKE 1 CAPSULE BY MOUTH AT BEDTIME. 02/14/22   Jaffe, Adam R, DO  Plecanatide (TRULANCE) 3 MG TABS Take 3 mg by mouth daily. 09/08/21   Thornton Park, MD  prochlorperazine (COMPAZINE) 10 MG tablet TAKE 1 TABLET BY MOUTH EVERY 8 HOURS AS NEEDED FOR NAUSEA AND VOMITING 07/17/22   Gerrit Heck, MD  Rimegepant Sulfate (NURTEC) 75 MG TBDP Take 75 mg by mouth as needed (Take 1 tab at the earlist onset of Migraine. max 1 tab in 24 hours). 07/06/22   Pieter Partridge, DO      Allergies    Zofran Alvis Lemmings hcl], Morphine and related, and Zofran [ondansetron]    Review of Systems   Review of Systems  Gastrointestinal:  Positive for diarrhea, nausea and vomiting.  All other systems reviewed and are negative.   Physical Exam Updated Vital Signs BP (!) 144/95   Pulse (!) 113   Temp 98 F (36.7 C) (Oral)   Resp 16   Ht 5' 2"  (1.575 m)   Wt 49.2 kg   SpO2 99%   BMI 19.84 kg/m  Physical Exam Vitals and nursing note reviewed.  Constitutional:      General: She is not in acute distress.    Appearance: Normal appearance. She is normal weight. She is not ill-appearing, toxic-appearing or diaphoretic.  HENT:     Head: Normocephalic and atraumatic.  Cardiovascular:     Rate and Rhythm: Normal rate and regular rhythm.     Heart sounds: Normal heart sounds.  Pulmonary:     Effort: Pulmonary effort is normal. No respiratory distress.     Breath sounds: Normal breath sounds.  Abdominal:     General: Abdomen is flat.     Palpations: Abdomen is soft.     Comments: Mild generalized abdominal tenderness  Musculoskeletal:        General: Normal range of motion.     Cervical back: Normal range of motion.  Skin:    General: Skin is warm and dry.  Neurological:     General: No focal deficit present.     Mental Status: She is alert.  Psychiatric:        Mood and Affect: Mood normal.        Behavior: Behavior normal.     ED Results / Procedures / Treatments   Labs (all labs ordered are listed, but only abnormal results are displayed) Labs Reviewed  CBC WITH DIFFERENTIAL/PLATELET - Abnormal; Notable for the following components:       Result Value   RBC 5.50 (*)    HCT 46.2 (*)    All other components within normal limits  COMPREHENSIVE METABOLIC PANEL - Abnormal; Notable for the following components:   CO2 20 (*)    Glucose, Bld 116 (*)    Total Protein 9.2 (*)    Anion gap 16 (*)    All other components within normal limits  URINALYSIS, ROUTINE W REFLEX MICROSCOPIC - Abnormal; Notable for the following components:   APPearance HAZY (*)    Hgb urine dipstick MODERATE (*)    Ketones, ur 80 (*)    Protein, ur 100 (*)    Leukocytes,Ua SMALL (*)    All other components within normal limits  HCG, QUANTITATIVE, PREGNANCY - Abnormal; Notable for the following components:   hCG, Beta Chain, Quant, S 6 (*)    All other components within normal limits  LIPASE, BLOOD    EKG None  Radiology No results found.  Procedures Procedures    Medications Ordered in ED Medications  sodium chloride 0.9 % bolus 1,000 mL (0 mLs Intravenous Stopped 09/20/22 0322)  metoCLOPramide (REGLAN) injection 10 mg (10 mg Intravenous Given 09/20/22 0217)  droperidol (INAPSINE) 2.5 MG/ML injection 1.25 mg (1.25 mg Intravenous Given 09/20/22 0423)    ED Course/ Medical Decision Making/ A&P                           Medical Decision Making Amount and/or Complexity of Data Reviewed Labs: ordered.  Risk Prescription drug management.   This patient is a 46 y.o. female who presents to the ED for concern of nausea, vomiting, and diarrhea, this involves an extensive number of treatment options, and is a complaint that carries with it a high risk of complications and morbidity. The emergent differential diagnosis prior to evaluation includes, but is not limited to,  The differential diagnosis for generalized abdominal pain includes, but is not limited to AAA, gastroenteritis, appendicitis, Bowel obstruction, Bowel perforation. Gastroparesis, DKA, Hernia, Inflammatory bowel disease, mesenteric ischemia, pancreatitis, peritonitis SBP,  volvulus.  This is not an exhaustive differential.   Past Medical History / Co-morbidities / Social History: Hx cannabinoid hyperemesis and cyclic vomiting syndrome  Additional history: Chart reviewed. Pertinent results include: patient has presented to the ER several times historically with similar symptoms and has had unremarkable work-up and is normally medicated with improvement and then discharged.  Physical Exam: Physical exam performed. The pertinent findings include: mild generalized abdominal tenderness without rebound or guarding  Lab Tests: I ordered, and personally interpreted labs.  The pertinent results include:  no leukocytosis or  anemia, bicarb 20, glucose 116, anion gap 16. UA with ketonuria and proteinuria. No other acute laboratory findings     Medications: I ordered medication including fluids, droperidol, and reglan  for dehydration, nausea, and vomiting. Reevaluation of the patient after these medicines showed that the patient resolved. I have reviewed the patients home medicines and have made adjustments as needed.  Disposition:  Patient presents today with complaints of nausea, vomiting, and diarrhea. She is afebrile, non-toxic appearing, and in no acute distress with reassuring vital signs. Patient is nontoxic, nonseptic appearing, in no apparent distress.  Patient's pain and other symptoms adequately managed in emergency department.  Fluid bolus given.  Labs, imaging and vitals reviewed.  Patient does not meet the SIRS or Sepsis criteria.  On repeat exam patient does not have a surgical abdomin and there are no peritoneal signs.  No indication of appendicitis, bowel obstruction, bowel perforation, cholecystitis, diverticulitis, PID or ectopic pregnancy.  Upon reassessment after medication management, patients symptoms have completely resolved and she is ready to go home. She is also able to eat and drink without any subsequent episodes of nausea or vomiting. Therefore  will defer imaging at this time. Patient is understanding and in agreement. Suspect symptoms are related to her history of cyclical vomiting/cannabinoid hyperemesis. Patient discharged home with symptomatic treatment and given strict instructions for follow-up with their primary care physician.  I have also discussed reasons to return immediately to the ER.  Patient expresses understanding and agrees with plan. Patient discharged in stable condition.   Final Clinical Impression(s) / ED Diagnoses Final diagnoses:  Cyclic vomiting syndrome    Rx / DC Orders ED Discharge Orders          Ordered    prochlorperazine (COMPAZINE) 10 MG tablet  Status:  Discontinued       Note to Pharmacy: Needs to follow up with PCP before more refills   09/20/22 0644    prochlorperazine (COMPAZINE) 10 MG tablet        09/20/22 3462          An After Visit Summary was printed and given to the patient.     Nestor Lewandowsky 09/20/22 1947    Palumbo, April, MD 09/20/22 0709    Bud Face, PA-C 09/20/22 Kathrynn Humble, April, MD 09/27/22 2328

## 2022-09-20 NOTE — ED Notes (Signed)
Pt decided to leave and not wait on discharge papers, provider notified

## 2022-09-20 NOTE — ED Triage Notes (Signed)
BIBA from home for abd pain that started 2 days ago with n/v, stated this happened a year ago and she had to be admitted

## 2022-09-20 NOTE — Discharge Instructions (Addendum)
As we discussed, your work-up in the ER today was reassuring for acute findings.  Laboratory evaluation did not reveal any emergent concerns.  Given that your symptoms have resolved with medication, no further work-up is indicated at this time.  I have recommended that you follow-up with your primary care doctor at your earliest convenience.  I have also refilled your antinausea medication which may take as prescribed as needed for any additional symptoms.  Return if development of any new or worsening symptoms.

## 2022-09-20 NOTE — ED Notes (Signed)
Drinking water and has no issues

## 2022-09-21 ENCOUNTER — Inpatient Hospital Stay: Payer: Medicaid Other

## 2022-09-21 ENCOUNTER — Other Ambulatory Visit: Payer: Self-pay | Admitting: Internal Medicine

## 2022-09-21 MED ORDER — CLOPIDOGREL BISULFATE 75 MG PO TABS: 37.5000 mg | ORAL_TABLET | Freq: Every day | ORAL | 0 refills | Status: DC

## 2022-09-21 NOTE — Progress Notes (Signed)
Request received to refill Plavix from Clearnce Sorrel, scheduler. Plavix rx was sent to electronically to [facility] [place] per pt request.     Narda Rutherford, AGNP-BC 09/21/2022, 9:50 AM

## 2022-09-28 ENCOUNTER — Other Ambulatory Visit: Payer: Self-pay | Admitting: Gastroenterology

## 2022-09-28 DIAGNOSIS — R1013 Epigastric pain: Secondary | ICD-10-CM

## 2022-10-15 ENCOUNTER — Telehealth: Payer: Self-pay | Admitting: Pharmacy Technician

## 2022-10-15 NOTE — Telephone Encounter (Signed)
Received notification from  [facility]  regarding a prior authorization for  Aimovig 140m . Authorization has been APPROVED from 10/15/22 to 10/15/23.    Authorization # Key:Twana First- PA Case ID: [id number]

## 2022-10-17 NOTE — Progress Notes (Signed)
    SUBJECTIVE:   CHIEF COMPLAINT / HPI: Continued nausea  Recently seen in ED on 09/20/2022.  Patient has used marijuana around every 3 weeks (last use was 3 weeks ago). ED visit where she started having symptoms of vomiting and dehydration like she had in the past. Was prescribed compazine by ED and she feels it is not working. Still having nausea in early morning and throughout the day (worst in morning). No fevers, some epigastric abdominal pain. Does take famotidine 2 times a day. Compazine 3 to 4 times a day and not helping. Next appointment with GI in Febraury. Not eating as much but is drinking normally. Having constipation still. Had a little BM this morning-trulance not helping much per patient. No urination issues.  She is smoking half a pack of cigarettes every day.  Is that in the morning she is having "vomiting" however says that it is yellowish/phlegm like.  No emesis any other time of the day.  PERTINENT  PMH / PSH:  history of cyclical vomiting, cannabinoid hyperemesis syndrome OBJECTIVE:   BP 101/75   Pulse (!) 110   Temp 98.8 F (37.1 C)   Ht 5' 2"  (1.575 m)   Wt 105 lb 9.6 oz (47.9 kg)   SpO2 98%   BMI 19.31 kg/m   General: Nontoxic but frail appearing, awake, alert, responsive to questions Head: Normocephalic atraumatic CV: Regular rate and rhythm no murmurs rubs or gallops Respiratory: Clear to ausculation bilaterally, no wheezes rales or crackles, chest rises symmetrically,  no increased work of breathing Abdomen: Soft, tender to palpation in epigastric region, non-distended, normoactive bowel sounds  Extremities: Moves upper and lower extremities freely Neuro: No focal deficits ASSESSMENT/PLAN:   Cyclic vomiting syndrome Patient continuing to have nausea and some phlegm like emesis in the morning.  Discussed avoiding cannabinoids.  Constipation could be playing a part in this as well as acid reflux. -Stop Compazine and start Phenergan (Phenergan has worked for  her in the past without issue) -Start Amitiza twice daily for IBS-C, continue Trulance-patient to follow-up with GI in February -Continue Pepcid twice daily -Discussed ED/return precautions with patient -Discussed avoiding cannabinoids   Gerrit Heck, MD [place]

## 2022-10-19 ENCOUNTER — Ambulatory Visit: Payer: Medicaid Other | Admitting: Student

## 2022-10-19 VITALS — BP 101/75 | HR 110 | Temp 98.8°F | Ht 62.0 in | Wt 105.6 lb

## 2022-10-19 DIAGNOSIS — K5904 Chronic idiopathic constipation: Secondary | ICD-10-CM | POA: Diagnosis not present

## 2022-10-19 DIAGNOSIS — R1115 Cyclical vomiting syndrome unrelated to migraine: Secondary | ICD-10-CM | POA: Diagnosis not present

## 2022-10-19 MED ORDER — PROMETHAZINE HCL 25 MG PO TABS: 25.0000 mg | ORAL_TABLET | Freq: Four times a day (QID) | ORAL | 0 refills | Status: DC | PRN

## 2022-10-19 MED ORDER — LUBIPROSTONE 8 MCG PO CAPS: 8.0000 ug | ORAL_CAPSULE | Freq: Two times a day (BID) | ORAL | 0 refills | Status: DC

## 2022-10-19 NOTE — Patient Instructions (Addendum)
It was great to see you! Thank you for allowing me to participate in your care!   Our plans for today:  - I am sending in phenergan to take every 6 hours as needed (stop compazine) - I am sending in a medication called amitiza twice a day to help with constipation  Take care and seek immediate care sooner if you develop any concerns.  Gerrit Heck, MD

## 2022-10-19 NOTE — Assessment & Plan Note (Signed)
Patient continuing to have nausea and some phlegm like emesis in the morning.  Discussed avoiding cannabinoids.  Constipation could be playing a part in this as well as acid reflux. -Stop Compazine and start Phenergan (Phenergan has worked for her in the past without issue) -Start Amitiza twice daily for IBS-C, continue Trulance-patient to follow-up with GI in February -Continue Pepcid twice daily -Discussed ED/return precautions with patient -Discussed avoiding cannabinoids

## 2022-10-26 ENCOUNTER — Other Ambulatory Visit: Payer: Self-pay | Admitting: Gastroenterology

## 2022-10-26 DIAGNOSIS — R1013 Epigastric pain: Secondary | ICD-10-CM

## 2022-10-31 ENCOUNTER — Telehealth: Payer: Self-pay | Admitting: Cardiovascular Disease

## 2022-10-31 NOTE — Telephone Encounter (Signed)
Patient c/o Palpitations:  High priority if patient c/o lightheadedness, shortness of breath, or chest pain  How long have you had palpitations/irregular HR/ Afib? Are you having the symptoms now? couple months, no  Are you currently experiencing lightheadedness, SOB or CP? Chest pressure now, gets SOB   Do you have a history of afib (atrial fibrillation) or irregular heart rhythm? yes  Have you checked your BP or HR? (document readings if available): no  Are you experiencing any other symptoms? Coughing   Patient states she has been having a hard heart beat and chest pressure. She says it feels like someone is sitting on her chest. She says she is having some chest pressure now. She says she also gets a cough and woke up with a cough this morning. She says her symptoms come and go.

## 2022-10-31 NOTE — Telephone Encounter (Signed)
Received call from patient-patient reports waking up this morning coughing and had chest pressure.   Reports having chest tightness for the last couple of weeks intermittently.   Feels like "something sitting on her chest".  Denies fever, congestion.   Reports being SOB last week but this has improved.   Reports HR continues to be elevated at all of her appointments.   Denies symptoms currently other than feeling the urge to cough.   Checked BP on the phone- 129/77, HR 135.    Denies drinking caffeine this AM, continues to smoke but reports cutting back.  Reports having cyclic vomiting syndrome and is always nauseous and vomiting.      Per chart review:  last OV 01/2022 with Dr. Audie Box for possible HF, tachycardia.  MRI ordered but not completed-reports going to MRI and was told she couldn't have this due to brain aneurysm.    Overdue appt scheduled with Dr. Audie Box (patient can only come on M or F).    Also advised would try to hydrate, avoid caffeine and will send message to Dr. Audie Box to review.     Advised if symptoms return or worsen to proceed to ER for evaluation.

## 2022-10-31 NOTE — Telephone Encounter (Signed)
Returned call to patient-aware of recommendations.

## 2022-11-06 ENCOUNTER — Other Ambulatory Visit: Payer: Self-pay | Admitting: Student

## 2022-11-06 DIAGNOSIS — R1115 Cyclical vomiting syndrome unrelated to migraine: Secondary | ICD-10-CM

## 2022-11-06 DIAGNOSIS — K5904 Chronic idiopathic constipation: Secondary | ICD-10-CM

## 2022-11-08 ENCOUNTER — Other Ambulatory Visit: Payer: Self-pay | Admitting: Student

## 2022-11-08 DIAGNOSIS — R1115 Cyclical vomiting syndrome unrelated to migraine: Secondary | ICD-10-CM

## 2022-11-13 NOTE — Progress Notes (Signed)
Cardiology Office Note:   Date:  11/16/2022  NAME:  Allison Leonard    MRN: [id number] DOB:  10-04-1976   PCP:  Gerrit Heck, MD  Cardiologist:  None  Electrophysiologist:  None   Referring MD: Gerrit Heck, MD   Chief Complaint  Patient presents with   Follow-up    History of Present Illness:   Allison Leonard is a 47 y.o. female with a hx of HFmEF, R ICA aneurysm who presents for follow-up.  She reports for the past year she has had on and off episodes of chest pain.  Described as sharp pain.  She reports it is achy.  In the middle of her chest.  It can occur once per week.  Not associated with food.  She reports some association with laying flat.  However does not predictable.  She reports no fevers or chills.  No cough.  She is still smoking 4 to 5 cigarettes/day.  I encouraged her to stop.  Her blood pressure is 140/96.  BP values have been controlled.  She has had sinus tachycardia for a long time.  Evaluation in March showed no PE.  She reports no shortness of breath.  No lower extremity edema.  No signs of heart failure.  She did have an echocardiogram that was interpreted as EF 40-45%.  We plan for cardiac MRI but she cannot do this due to carotid artery embolization.  She denies any association with food.  No other updates to her medical history.  Unclear if this is chest wall related.  We discussed Tylenol and ibuprofen as needed.  We also discussed further evaluation with echocardiogram and coronary CTA.  Problem List Systolic HF -EF 40-98% 2. 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  Mild mitral valve regurgitation/mild aortic regurgitation 4. HTN 5. Migraine  Past Medical History: Past Medical History:  Diagnosis Date   Acid reflux 07/04/2018   Anemia    Brain aneurysm 2022   Cannabinoid hyperemesis syndrome 10/27/2018   Celiac disease/sprue    Cyclic vomiting syndrome    Dyspnea    in past per patient 11/24/21   Generalized anxiety  disorder 07/04/2018   History of colon polyps    last colonoscopy 2018 removed multiple benign polyps   Migraines 07/04/2018    Past Surgical History: Past Surgical History:  Procedure Laterality Date   ABDOMINAL HYSTERECTOMY  2006   APPENDECTOMY  1999   COLONOSCOPY  2018   ESOPHAGOGASTRODUODENOSCOPY  2018   IR 3D INDEPENDENT WKST  09/01/2021   IR ANGIO INTRA EXTRACRAN SEL COM CAROTID INNOMINATE BILAT MOD SED  09/01/2021   IR ANGIO INTRA EXTRACRAN SEL INTERNAL CAROTID UNI R MOD SED  11/29/2021   IR ANGIO VERTEBRAL SEL VERTEBRAL BILAT MOD SED  09/01/2021   IR ANGIOGRAM FOLLOW UP STUDY  11/29/2021   IR CT HEAD LTD  11/29/2021   IR RADIOLOGIST EVAL & MGMT  08/21/2021   IR RADIOLOGIST EVAL & MGMT  09/07/2021   IR RADIOLOGIST EVAL & MGMT  12/19/2021   IR TRANSCATH/EMBOLIZ  11/27/2021   RADIOLOGY WITH ANESTHESIA N/A 11/27/2021   Procedure: Drucilla Schmidt;  Surgeon: Luanne Bras, MD;  Location: [facility];  Service: Radiology;  Laterality: N/A;    Current Medications: Current Meds  Medication Sig   AMITIZA 8 MCG capsule TAKE 1 CAPSULE (8 MCG TOTAL) BY MOUTH 2 (TWO) TIMES DAILY WITH A MEAL.   clopidogrel (PLAVIX) 75 MG tablet Take 0.5 tablets (37.5 mg total) by mouth daily.  dicyclomine (BENTYL) 20 MG tablet TAKE 1 TABLET (20 MG TOTAL) BY MOUTH 3 (THREE) TIMES DAILY AS NEEDED. PATIENT NEEDS OFFICE VISIT.   Erenumab-aooe (AIMOVIG) 140 MG/ML SOAJ Inject 140 mg into the skin every 28 (twenty-eight) days.   famotidine (PEPCID) 20 MG tablet TAKE 1 TABLET BY MOUTH TWICE A DAY   ivabradine (CORLANOR) 5 MG TABS tablet Take 2 tablets (10 mg total) by mouth once for 1 dose.   metoprolol tartrate (LOPRESSOR) 100 MG tablet Take 1 tablet by mouth once for procedure.   nortriptyline (PAMELOR) 50 MG capsule TAKE 1 CAPSULE BY MOUTH AT BEDTIME.   Plecanatide (TRULANCE) 3 MG TABS Take 3 mg by mouth daily.   promethazine (PHENERGAN) 25 MG tablet TAKE 1 TABLET BY MOUTH EVERY 6 HOURS AS NEEDED FOR NAUSEA  OR VOMITING FOR UP TO 7 DAYS   Rimegepant Sulfate (NURTEC) 75 MG TBDP Take 75 mg by mouth as needed (Take 1 tab at the earlist onset of Migraine. max 1 tab in 24 hours).     Allergies:    Zofran [ondansetron hcl], Morphine and related, and Zofran [ondansetron]   Social History: Social History   Socioeconomic History   Marital status: Single    Spouse name: Not on file   Number of children: 3   Years of education: Not on file   Highest education level: Not on file  Occupational History   Not on file  Tobacco Use   Smoking status: Every Day    Packs/day: 0.50    Years: 10.00    Total pack years: 5.00    Types: Cigarettes   Smokeless tobacco: Never  Vaping Use   Vaping Use: Never used  Substance and Sexual Activity   Alcohol use: Not Currently   Drug use: Not Currently    Frequency: 1.0 times per week    Comment: Last use 11/18/21   Sexual activity: Not on file    Comment: Hysterectomy  Other Topics Concern   Not on file  Social History Narrative   Right handed   [employer]   Drinks coffee occasionally   Social Determinants of Health   Financial Resource Strain: Not on file  Food Insecurity: Not on file  Transportation Needs: Not on file  Physical Activity: Not on file  Stress: Not on file  Social Connections: Not on file     Family History: The patient's family history includes Cervical cancer in her maternal aunt; Colon cancer in her sister; Colon polyps in her father; Diabetes in her father; Down syndrome in her son; Heart attack in her maternal grandmother and paternal grandmother; Heart disease in her father; Hyperlipidemia in her father; Hypertension in her father; Stomach cancer in her maternal aunt; Stroke in her maternal uncle; Throat cancer in her maternal uncle. There is no history of Esophageal cancer or Rectal cancer.  ROS:   All other ROS reviewed and negative. Pertinent positives noted in the HPI.     EKGs/Labs/Other Studies Reviewed:   The  following studies were personally reviewed by me today:  EKG:  EKG is ordered today.  The ekg ordered today demonstrates sinus tachycardia heart rate 105, no acute ischemic changes or evidence of infarction, and was personally reviewed by me.   TTE 01/18/2022  1. Left ventricular ejection fraction, by estimation, is 40 to 45%. The  left ventricle has mildly decreased function. The left ventricle  demonstrates global hypokinesis. Left ventricular diastolic parameters are  consistent with Grade I diastolic  dysfunction (  impaired relaxation).   2. Right ventricular systolic function is normal. The right ventricular  size is normal. There is normal pulmonary artery systolic pressure. The  estimated right ventricular systolic pressure is 16.5 mmHg.   3. The mitral valve is abnormal. Mild mitral valve regurgitation.   4. The aortic valve is tricuspid. Aortic valve regurgitation is mild.  Aortic valve sclerosis is present, with no evidence of aortic valve  stenosis.   5. The inferior vena cava is normal in size with <50% respiratory  variability, suggesting right atrial pressure of 8 mmHg.   Recent Labs: 11/28/2021: Magnesium 2.7 01/29/2022: BNP 11.5; TSH 0.459 09/20/2022: ALT 13; BUN 14; Creatinine, Ser 0.57; Hemoglobin 14.5; Platelets 228; Potassium 3.7; Sodium 139   Recent Lipid Panel No results found for: "CHOL", "TRIG", "HDL", "CHOLHDL", "VLDL", "LDLCALC", "LDLDIRECT"  Physical Exam:   VS:  BP (!) 140/96 (BP Location: Right Arm, Patient Position: Sitting, Cuff Size: Normal)   Pulse (!) 105   Ht '5\' 2"'$  (1.575 m)   Wt 107 lb (48.5 kg)   BMI 19.57 kg/m    Wt Readings from Last 3 Encounters:  11/16/22 107 lb (48.5 kg)  10/19/22 105 lb 9.6 oz (47.9 kg)  09/20/22 108 lb 7.5 oz (49.2 kg)    General: Well nourished, well developed, in no acute distress Head: Atraumatic, normal size  Eyes: PEERLA, EOMI  Neck: Supple, no JVD Endocrine: No thryomegaly Cardiac: Normal S1, S2; RRR; no  murmurs, rubs, or gallops Lungs: Clear to auscultation bilaterally, no wheezing, rhonchi or rales  Abd: Soft, nontender, no hepatomegaly  Ext: No edema, pulses 2+ Musculoskeletal: No deformities, BUE and BLE strength normal and equal Skin: Warm and dry, no rashes   Neuro: Alert and oriented to person, place, time, and situation, CNII-XII grossly intact, no focal deficits  Psych: Normal mood and affect   ASSESSMENT:   SHERYN ALDAZ is a 47 y.o. female who presents for the following: 1. Heart failure with mid-range ejection fraction ([place])   2. Tachycardia   3. Tobacco abuse   4. Chest pain of uncertain etiology     PLAN:   1. Heart failure with mid-range ejection fraction ([place]) 2. Tachycardia 3. Tobacco abuse 4. Chest pain of uncertain etiology -She presents with ongoing chest pain symptoms.  Describes some positional component as well as worsen with activity.  I would like to check an ESR and CRP to make sure this is not pericarditis.  Her EKG really shows no acute ischemic changes other than sinus tachycardia.  Recent TSH 0.45.  Hemoglobin normal.  She is still smoking.  Could be chest wall related.  I encouraged her to quit smoking.  I would like to repeat her echocardiogram given concerns for EF being low in the past.  We tried to do an MRI but she cannot do this due to embolization of a carotid artery.  I would also like for her to proceed with coronary CTA.  Chest CT in March of last year showed no evidence of coronary calcium or pulmonary embolism.  She will take 100 mg metoprolol tartrate and 10 mg of ivabradine.  Her heart rate is 105.  Suspect as long as her heart rate is in the 70s we will get a good scan.  She will then see me back in 3 months after.  In the interim she should continue with her Prilosec as well as Tylenol and ibuprofen as needed.      Disposition: Return in about 3  months (around 02/15/2023).  Medication Adjustments/Labs and Tests Ordered: Current  medicines are reviewed at length with the patient today.  Concerns regarding medicines are outlined above.  Orders Placed This Encounter  Procedures   CT CORONARY MORPH W/CTA COR W/SCORE W/CA W/CM &/OR WO/CM   Basic metabolic panel   Sedimentation rate   C-reactive protein   EKG 12-Lead   ECHOCARDIOGRAM COMPLETE   Meds ordered this encounter  Medications   ivabradine (CORLANOR) 5 MG TABS tablet    Sig: Take 2 tablets (10 mg total) by mouth once for 1 dose.    Dispense:  2 tablet    Refill:  0   metoprolol tartrate (LOPRESSOR) 100 MG tablet    Sig: Take 1 tablet by mouth once for procedure.    Dispense:  1 tablet    Refill:  0    Patient Instructions  Medication Instructions:  Take Metoprolol 100 mg two hours before CT when scheduled.  Take Ivabradine 10 mg two hours before CT when scheduled.  *If you need a refill on your cardiac medications before your next appointment, please call your pharmacy*   Lab Work: ESR, CRP, BMET today   If you have labs (blood work) drawn today and your tests are completely normal, you will receive your results only by: [place] (if you have MyChart) OR A paper copy in the mail If you have any lab test that is abnormal or we need to change your treatment, we will call you to review the results.   Testing/Procedures: Echocardiogram - Your physician has requested that you have an echocardiogram. Echocardiography is a painless test that uses sound waves to create images of your heart. It provides your doctor with information about the size and shape of your heart and how well your heart's chambers and valves are working. This procedure takes approximately one hour. There are no restrictions for this procedure.   Coronary CTA- they will contact you for an appointment    Follow-Up: At [facility], you and your health needs are our priority.  As part of our continuing mission to provide you with exceptional heart care, we have  created designated Provider Care Teams.  These Care Teams include your primary Cardiologist (physician) and Advanced Practice Providers (APPs -  Physician Assistants and Nurse Practitioners) who all work together to provide you with the care you need, when you need it.  We recommend signing up for the patient portal called "MyChart".  Sign up information is provided on this After Visit Summary.  MyChart is used to connect with patients for Virtual Visits (Telemedicine).  Patients are able to view lab/test results, encounter notes, upcoming appointments, etc.  Non-urgent messages can be sent to your provider as well.   To learn more about what you can do with MyChart, go to [email or web address].    Your next appointment:   3 month(s)  Provider:   Eleonore Chiquito, MD   Other Instructions   Your cardiac CT will be scheduled at one of the below locations:   [facility] [address] [place], [place] [address] [phone]  If scheduled at [facility], please arrive at the [facility] and Children's Entrance (Entrance C2) of [facility] 30 minutes prior to test start time. You can use the FREE valet parking offered at entrance C (encouraged to control the heart rate for the test)  Proceed to the [facility] Radiology Department (first floor) to check-in and test prep.  All  radiology patients and guests should use entrance C2 at [facility], accessed from [facility], even though the hospital's physical address listed is [address].     Please follow these instructions carefully (unless otherwise directed):   On the Night Before the Test: Be sure to Drink plenty of water. Do not consume any caffeinated/decaffeinated beverages or chocolate 12 hours prior to your test. Do not take any antihistamines 12 hours prior to your test.  On the Day of the Test: Drink plenty of water until 1 hour prior to the test. Do not eat any food 1  hour prior to test. You may take your regular medications prior to the test.  Take metoprolol (Lopressor) two hours prior to test. HOLD Furosemide/Hydrochlorothiazide morning of the test. FEMALES- please wear underwire-free bra if available, avoid dresses & tight clothing    After the Test: Drink plenty of water. After receiving IV contrast, you may experience a mild flushed feeling. This is normal. On occasion, you may experience a mild rash up to 24 hours after the test. This is not dangerous. If this occurs, you can take Benadryl 25 mg and increase your fluid intake. If you experience trouble breathing, this can be serious. If it is severe call 911 IMMEDIATELY. If it is mild, please call our office. If you take any of these medications: Glipizide/Metformin, Avandament, Glucavance, please do not take 48 hours after completing test unless otherwise instructed.  We will call to schedule your test 2-4 weeks out understanding that some insurance companies will need an authorization prior to the service being performed.   For non-scheduling related questions, please contact the cardiac imaging nurse navigator should you have any questions/concerns: Marchia Bond, Cardiac Imaging Nurse Navigator Gordy Clement, Cardiac Imaging Nurse Navigator [facility] Direct Office Dial: [phone]   For scheduling needs, including cancellations and rescheduling, please call [place], [phone].     Time Spent with Patient: I have spent a total of 35 minutes with patient reviewing hospital notes, telemetry, EKGs, labs and examining the patient as well as establishing an assessment and plan that was discussed with the patient.  > 50% of time was spent in direct patient care.  Signed, Addison Naegeli. Audie Box, MD, [place]  [address], [place], [place] [address] [phone]  11/16/2022 10:17 AM

## 2022-11-16 ENCOUNTER — Encounter: Payer: Self-pay | Admitting: Cardiovascular Disease

## 2022-11-16 ENCOUNTER — Ambulatory Visit: Payer: Medicaid Other | Attending: Cardiovascular Disease | Admitting: Cardiovascular Disease

## 2022-11-16 VITALS — BP 140/96 | HR 105 | Ht 62.0 in | Wt 107.0 lb

## 2022-11-16 DIAGNOSIS — I5022 Chronic systolic (congestive) heart failure: Secondary | ICD-10-CM

## 2022-11-16 DIAGNOSIS — R000 Tachycardia, unspecified: Secondary | ICD-10-CM

## 2022-11-16 DIAGNOSIS — R079 Chest pain, unspecified: Secondary | ICD-10-CM | POA: Diagnosis not present

## 2022-11-16 DIAGNOSIS — Z720 Tobacco use: Secondary | ICD-10-CM | POA: Diagnosis not present

## 2022-11-16 MED ORDER — METOPROLOL TARTRATE 100 MG PO TABS: ORAL_TABLET | ORAL | 0 refills | Status: DC

## 2022-11-16 MED ORDER — IVABRADINE HCL 5 MG PO TABS: 10.0000 mg | ORAL_TABLET | Freq: Once | ORAL | 0 refills | Status: AC

## 2022-11-16 NOTE — Patient Instructions (Addendum)
Medication Instructions:  Take Metoprolol 100 mg two hours before CT when scheduled.  Take Ivabradine 10 mg two hours before CT when scheduled.  *If you need a refill on your cardiac medications before your next appointment, please call your pharmacy*   Lab Work: ESR, CRP, BMET today   If you have labs (blood work) drawn today and your tests are completely normal, you will receive your results only by: [place] (if you have MyChart) OR A paper copy in the mail If you have any lab test that is abnormal or we need to change your treatment, we will call you to review the results.   Testing/Procedures: Echocardiogram - Your physician has requested that you have an echocardiogram. Echocardiography is a painless test that uses sound waves to create images of your heart. It provides your doctor with information about the size and shape of your heart and how well your heart's chambers and valves are working. This procedure takes approximately one hour. There are no restrictions for this procedure.   Coronary CTA- they will contact you for an appointment    Follow-Up: At [facility], you and your health needs are our priority.  As part of our continuing mission to provide you with exceptional heart care, we have created designated Provider Care Teams.  These Care Teams include your primary Cardiologist (physician) and Advanced Practice Providers (APPs -  Physician Assistants and Nurse Practitioners) who all work together to provide you with the care you need, when you need it.  We recommend signing up for the patient portal called "MyChart".  Sign up information is provided on this After Visit Summary.  MyChart is used to connect with patients for Virtual Visits (Telemedicine).  Patients are able to view lab/test results, encounter notes, upcoming appointments, etc.  Non-urgent messages can be sent to your provider as well.   To learn more about what you can do with MyChart, go to  [email or web address].    Your next appointment:   3 month(s)  Provider:   Eleonore Chiquito, MD   Other Instructions   Your cardiac CT will be scheduled at one of the below locations:   [facility] [address] [place],  [address] [phone]  If scheduled at [facility], please arrive at the [facility] and Children's Entrance (Entrance C2) of [facility] 30 minutes prior to test start time. You can use the FREE valet parking offered at entrance C (encouraged to control the heart rate for the test)  Proceed to the [facility] Radiology Department (first floor) to check-in and test prep.  All radiology patients and guests should use entrance C2 at [facility], accessed from [facility], even though the hospital's physical address listed is [address].     Please follow these instructions carefully (unless otherwise directed):   On the Night Before the Test: Be sure to Drink plenty of water. Do not consume any caffeinated/decaffeinated beverages or chocolate 12 hours prior to your test. Do not take any antihistamines 12 hours prior to your test.  On the Day of the Test: Drink plenty of water until 1 hour prior to the test. Do not eat any food 1 hour prior to test. You may take your regular medications prior to the test.  Take metoprolol (Lopressor) two hours prior to test. HOLD Furosemide/Hydrochlorothiazide morning of the test. FEMALES- please wear underwire-free bra if available, avoid dresses & tight clothing    After the  Test: Drink plenty of water. After receiving IV contrast, you may experience a mild flushed feeling. This is normal. On occasion, you may experience a mild rash up to 24 hours after the test. This is not dangerous. If this occurs, you can take Benadryl 25 mg and increase your fluid intake. If you experience trouble breathing, this can be serious. If it is severe call 911 IMMEDIATELY.  If it is mild, please call our office. If you take any of these medications: Glipizide/Metformin, Avandament, Glucavance, please do not take 48 hours after completing test unless otherwise instructed.  We will call to schedule your test 2-4 weeks out understanding that some insurance companies will need an authorization prior to the service being performed.   For non-scheduling related questions, please contact the cardiac imaging nurse navigator should you have any questions/concerns: Marchia Bond, Cardiac Imaging Nurse Navigator Gordy Clement, Cardiac Imaging Nurse Navigator [facility] Direct Office Dial: [phone]   For scheduling needs, including cancellations and rescheduling, please call [place], [phone].

## 2022-11-17 LAB — BASIC METABOLIC PANEL
BUN/Creatinine Ratio: 21 (ref 9–23)
BUN: 13 mg/dL (ref 6–24)
CO2: 23 mmol/L (ref 20–29)
Calcium: 10.2 mg/dL (ref 8.7–10.2)
Chloride: 102 mmol/L (ref 96–106)
Creatinine, Ser: 0.61 mg/dL (ref 0.57–1.00)
Glucose: 97 mg/dL (ref 70–99)
Potassium: 5.5 mmol/L — ABNORMAL HIGH (ref 3.5–5.2)
Sodium: 141 mmol/L (ref 134–144)
eGFR: 112 mL/min/{1.73_m2} (ref >59)

## 2022-11-17 LAB — SEDIMENTATION RATE: Sed Rate: 2 mm/hr (ref 0–32)

## 2022-11-17 LAB — C-REACTIVE PROTEIN: CRP: <1 mg/L (ref 0–10)

## 2022-11-19 ENCOUNTER — Other Ambulatory Visit: Payer: Self-pay

## 2022-11-19 DIAGNOSIS — E875 Hyperkalemia: Secondary | ICD-10-CM

## 2022-11-21 ENCOUNTER — Other Ambulatory Visit: Payer: Self-pay | Admitting: Gastroenterology

## 2022-11-21 DIAGNOSIS — R1013 Epigastric pain: Secondary | ICD-10-CM

## 2022-11-21 NOTE — Progress Notes (Deleted)
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  D/c nortriptyline ? *** Migraine rescue:  *** Limit use of pain relievers to no more than 2 days out of week to prevent risk of rebound or medication-overuse headache. Keep headache diary Follow up ***     Subjective:  Allison Leonard is a 47 year old female with Celiac disease and Cyclic Vomiting Syndrome who follows up for migraines.   UPDATE: Started Aimovig *** Intensity:  8-8.5/10 Duration:  all day.  Within 30 minutes with Nurtec *** Frequency:  10-15 days a month ***  Current NSAIDS:  none Current analgesics:  none Current triptans:  contraindicated - cerebral aneurysms, history of cerebral venous thrombosis Current ergotamine:  none Current anti-emetic:  none Current muscle relaxants:  none Current sleep aide:  none Current Antihypertensive medications:  none Current Antidepressant medications:  nortriptyline 6m QHS Current Anticonvulsant medications: none Current anti-CGRP:  Aimovig 1457m Nurtec PRN Current Vitamins/Herbal/Supplements:  none Current Antihistamines/Decongestants:  none Other therapy:  none Hormone/birth control:  none   Caffeine:  Coffee 1 to 2 times a month.  No soda Exercise:  no Depression:  no; Anxiety:  no Other pain:  Sometimes abdominal discomfort Sleep hygiene:  Restless   HISTORY:  Started having headaches at age 47 Worse over the past 3 to 4 years.  They are mostly severe sharp or pounding headache in her right temple.  Usually lasts a day.  Sometimes they wake her up at night.  17 days over last 30 days.  Nausea, vomiting, photophobia, phonophobia, blurred  vision.  No numbness or weakness.  No specific triggers.  Vomiting may help make it better.  MRI brain with and without contrast on 06/03/2020 personally reviewed showed no acute intracranial abnormality but did show chronic nonocclusive thrombus in the superior sagittal sinus; 1-3 mm outpouchings involving the right cavernous ICA, left ICA terminus, proximal basilar artery and right P1 segment; and mild to moderate bilateral A1 segment narrowing.  Follow up CTA of head on 06/20/2020 personally reviewed redemonstrated 1-2 mm distal cavernous/paraclinoid right ICA aneurysm and 2 mm proximal basilar artery aneurysm but previously seen small aneurysms of the right P1 PCA and left ICA terminus not appreciated.  She was referred to interventional radiology at that time but she never received a call.  Hypercoagulable panel including Factor V Leiden gene mutation, from June and August 2021 was negative.  Repeat CTA head performed 08/02/2021 personally reviewed again demonstrated 1-2 mm right paraclinoid ICA aneurysm vs infundibulum and 2 mm proximal basilar artery aneurysm vs fenestration, unchanged from prior study.  She was referred to endovascular interventional radiology.  On 11/27/2021, she underwent embolization with pipeline shield flow diverter device for the right ICA aneurysm.  She was discharged but she had a syncopal episode where she developed a migraine afterwards, prompting her to go to the ED.  CTA head and neck personally reviewed revealed no postembolization complications.  She was treated with a headache cocktail.  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     Past NSAIDS:  ibuprofen Past analgesics:  Fioricet, Excedrin Past abortive triptans:  sumatriptan 129m Past abortive  ergotamine:  none Past muscle relaxants:  none Past anti-emetic:  Compazine, Reglan 1666m promethazine 12.66m87mast antihypertensive medications:  none Past antidepressant medications:  none Past anticonvulsant medications:  topiramate Past anti-CGRP:  UbrRoselyn Meierst vitamins/Herbal/Supplements:  none Past antihistamines/decongestants:  Benadryl Other past therapies:  none   Family history of headache:  Brother, sister, mother, daughter  PAST MEDICAL HISTORY: Past Medical History:  Diagnosis Date   Acid reflux 07/04/2018   Anemia    Brain aneurysm 2022   Celiac disease/sprue    Cyclic vomiting syndrome    Dyspnea    in past per patient 11/24/21   Generalized anxiety disorder 07/04/2018   History of colon polyps    last colonoscopy 2018 removed multiple benign polyps   Migraines 07/04/2018    MEDICATIONS: Current Outpatient Medications on File Prior to Visit  Medication Sig Dispense Refill   clopidogrel (PLAVIX) 75 MG tablet Take 0.5 tablets (37.5 mg total) by mouth daily. 90 tablet 0   dicyclomine (BENTYL) 20 MG tablet Take 1 tablet (20 mg total) by mouth 3 (three) times daily as needed for spasms. (Patient not taking: Reported on 01/29/2022) 30 tablet 11   famotidine (PEPCID) 20 MG tablet Take 1 tablet (20 mg total) by mouth 2 (two) times daily. 180 tablet 3   nortriptyline (PAMELOR) 50 MG capsule TAKE 1 CAPSULE BY MOUTH AT BEDTIME. 90 capsule 2   Plecanatide (TRULANCE) 3 MG TABS Take 3 mg by mouth daily. 30 tablet 11   prochlorperazine (COMPAZINE) 10 MG tablet TAKE 1 TABLET BY MOUTH EVERY 8 HOURS AS NEEDED FOR NAUSEA AND VOMITING 30 tablet 0   Rimegepant Sulfate (NURTEC) 75 MG TBDP Take 75 mg by mouth as needed (Take 1 tab at the earlist onset of Migraine. max 1 tab in 24 hours). 16 tablet 5   No current facility-administered medications on file prior to visit.    ALLERGIES: Allergies  Allergen Reactions   Zofran [Ondansetron Hcl] Anaphylaxis   Morphine And Related Itching     FAMILY HISTORY: Family History  Problem Relation Age of Onset   Heart disease Father    Diabetes Father    Hypertension Father    Hyperlipidemia Father    Colon polyps Father    Colon cancer Sister    Cervical cancer Maternal Aunt    Stomach cancer Maternal Aunt    Throat cancer Maternal Uncle    Stroke Maternal Uncle    Heart attack Maternal Grandmother    Heart attack Paternal Grandmother    Down syndrome Son    Esophageal cancer Neg Hx    Rectal cancer Neg Hx       Objective:  *** General: No acute distress.  Patient appears well-groomed.   Head:  Normocephalic/atraumatic Eyes:  Fundi examined but not visualized Neck: supple, no paraspinal tenderness, full range of motion Heart:  Regular rate and rhythm Neurological Exam: alert and oriented to person, place, and time.  Speech fluent and not dysarthric, language intact.  CN II-XII intact. Bulk and tone normal, muscle strength 5/5 throughout.  Sensation to light touch intact.  Deep tendon reflexes 2+ throughout.  Finger to nose testing intact.  Gait normal, Romberg negative.   AdaMetta ClinesO  CC: MayGerrit HeckD

## 2022-11-22 ENCOUNTER — Ambulatory Visit: Payer: Medicaid Other | Admitting: Neurology

## 2022-11-23 ENCOUNTER — Telehealth (HOSPITAL_COMMUNITY): Payer: Self-pay | Admitting: *Deleted

## 2022-11-23 ENCOUNTER — Ambulatory Visit: Payer: Medicaid Other | Admitting: Gastroenterology

## 2022-11-23 NOTE — Telephone Encounter (Signed)
Reaching out to patient to offer assistance regarding upcoming cardiac imaging study; pt verbalizes understanding of appt date/time, parking situation and where to check in, pre-test NPO status and medications ordered, and verified current allergies; name and call back number provided for further questions should they arise  Gordy Clement RN Navigator Cardiac Imaging [facility] [phone] office [phone] cell  Patient to take '100mg'$  metoprolol tartrate and '10mg'$  ivabradine two hours prior to her cardiac CT scan. She is aware to arrive at 10:30am.

## 2022-11-26 ENCOUNTER — Other Ambulatory Visit (HOSPITAL_COMMUNITY): Payer: Self-pay | Admitting: *Deleted

## 2022-11-26 ENCOUNTER — Ambulatory Visit (HOSPITAL_COMMUNITY)
Admission: RE | Admit: 2022-11-26 | Discharge: 2022-11-26 | Disposition: A | Discharge status: Home / Self Care | Payer: Medicaid Other | Source: Ambulatory Visit | Attending: Cardiovascular Disease | Admitting: Cardiovascular Disease

## 2022-11-26 DIAGNOSIS — R079 Chest pain, unspecified: Secondary | ICD-10-CM | POA: Insufficient documentation

## 2022-11-26 MED ORDER — DILTIAZEM HCL 25 MG/5ML IV SOLN
INTRAVENOUS | Status: AC
  Administered 2022-11-26: 5 mg via INTRAVENOUS
  Filled 2022-11-26: qty 5

## 2022-11-26 MED ORDER — METOPROLOL TARTRATE 100 MG PO TABS: ORAL_TABLET | ORAL | 0 refills | Status: DC

## 2022-11-26 MED ORDER — METOPROLOL TARTRATE 5 MG/5ML IV SOLN
INTRAVENOUS | Status: AC
  Administered 2022-11-26: 5 mg via INTRAVENOUS
  Filled 2022-11-26: qty 10

## 2022-11-26 MED ORDER — IVABRADINE HCL 5 MG PO TABS: ORAL_TABLET | ORAL | 0 refills | Status: DC

## 2022-11-26 MED ORDER — NITROGLYCERIN 0.4 MG SL SUBL: 0.8000 mg | SUBLINGUAL_TABLET | SUBLINGUAL | Status: DC | PRN

## 2022-11-26 MED ORDER — METOPROLOL TARTRATE 5 MG/5ML IV SOLN
5.0000 mg | INTRAVENOUS | Status: DC | PRN
  Administered 2022-11-26: 5 mg via INTRAVENOUS

## 2022-11-26 MED ORDER — DILTIAZEM HCL 25 MG/5ML IV SOLN: 5.0000 mg | Freq: Once | INTRAVENOUS | Status: AC

## 2022-11-26 NOTE — Progress Notes (Signed)
This patient was unable to be scanned due to elevated HR after trials of 20 mg of lopressor and 5 mg of diltiazem. She was subsequently cancelled. Navigator was made aware.

## 2022-11-28 ENCOUNTER — Other Ambulatory Visit: Payer: Self-pay

## 2022-11-28 DIAGNOSIS — E875 Hyperkalemia: Secondary | ICD-10-CM

## 2022-11-29 DIAGNOSIS — E875 Hyperkalemia: Secondary | ICD-10-CM | POA: Diagnosis not present

## 2022-11-30 LAB — BASIC METABOLIC PANEL
BUN/Creatinine Ratio: 16 (ref 9–23)
BUN: 10 mg/dL (ref 6–24)
CO2: 24 mmol/L (ref 20–29)
Calcium: 10 mg/dL (ref 8.7–10.2)
Chloride: 97 mmol/L (ref 96–106)
Creatinine, Ser: 0.64 mg/dL (ref 0.57–1.00)
Glucose: 106 mg/dL — ABNORMAL HIGH (ref 70–99)
Potassium: 4.3 mmol/L (ref 3.5–5.2)
Sodium: 138 mmol/L (ref 134–144)
eGFR: 110 mL/min/{1.73_m2} (ref >59)

## 2022-11-30 NOTE — Progress Notes (Signed)
Patient had blood work completed.

## 2022-12-06 ENCOUNTER — Other Ambulatory Visit: Payer: Self-pay | Admitting: Student

## 2022-12-06 DIAGNOSIS — R1115 Cyclical vomiting syndrome unrelated to migraine: Secondary | ICD-10-CM

## 2022-12-07 ENCOUNTER — Other Ambulatory Visit (HOSPITAL_COMMUNITY): Payer: Medicaid Other

## 2022-12-07 ENCOUNTER — Ambulatory Visit: Payer: Medicaid Other | Admitting: Gastroenterology

## 2022-12-07 ENCOUNTER — Ambulatory Visit (HOSPITAL_COMMUNITY)
Admission: RE | Admit: 2022-12-07 | Discharge: 2022-12-07 | Disposition: A | Discharge status: Home / Self Care | Payer: Medicaid Other | Source: Ambulatory Visit | Attending: Cardiovascular Disease | Admitting: Cardiovascular Disease

## 2022-12-07 DIAGNOSIS — R000 Tachycardia, unspecified: Secondary | ICD-10-CM | POA: Insufficient documentation

## 2022-12-07 DIAGNOSIS — I351 Nonrheumatic aortic (valve) insufficiency: Secondary | ICD-10-CM | POA: Insufficient documentation

## 2022-12-07 DIAGNOSIS — I5022 Chronic systolic (congestive) heart failure: Secondary | ICD-10-CM | POA: Insufficient documentation

## 2022-12-07 DIAGNOSIS — I3139 Other pericardial effusion (noninflammatory): Secondary | ICD-10-CM | POA: Diagnosis not present

## 2022-12-07 DIAGNOSIS — R079 Chest pain, unspecified: Secondary | ICD-10-CM

## 2022-12-09 LAB — ECHOCARDIOGRAM COMPLETE
AR max vel: 1.35 cm2
AV Area VTI: 1.43 cm2
AV Area mean vel: 1.29 cm2
AV Mean grad: 4 mmHg
AV Peak grad: 7.5 mmHg
Ao pk vel: 1.37 m/s
Area-P 1/2: 3.37 cm2
Calc EF: 46.6 %
MV VTI: 0.93 cm2
P 1/2 time: 588 msec
S' Lateral: 3.7 cm
Single Plane A2C EF: 54.7 %
Single Plane A4C EF: 41.2 %

## 2022-12-11 NOTE — Progress Notes (Deleted)
Cardiology Office Note:   Date:  12/11/2022  NAME:  Allison Leonard    MRN: [id number] DOB:  14-Apr-1976   PCP:  Gerrit Heck, MD  Cardiologist:  None  Electrophysiologist:  None   Referring MD: Lyndee Hensen, DO   No chief complaint on file. ***  History of Present Illness:   Allison Leonard is a 47 y.o. female with a hx of systolic heart failure with recovered ejection fraction, right ICA aneurysm, hypertension, migraine who presents for follow-up.  Evaluated for chest pain.  Heart rate too high for coronary CTA.  Echocardiogram with rheumatic mitral stenosis.  Mitral valve area planimetry at 1.85 cm.  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 R ICA Aneurysm  -s/p embolization 11/2021 5. HTN 6. Migraine  Past Medical History: Past Medical History:  Diagnosis Date   Acid reflux 07/04/2018   Anemia    Brain aneurysm 2022   Cannabinoid hyperemesis syndrome 10/27/2018   Celiac disease/sprue    Cyclic vomiting syndrome    Dyspnea    in past per patient 11/24/21   Generalized anxiety disorder 07/04/2018   History of colon polyps    last colonoscopy 2018 removed multiple benign polyps   Migraines 07/04/2018    Past Surgical History: Past Surgical History:  Procedure Laterality Date   ABDOMINAL HYSTERECTOMY  2006   APPENDECTOMY  1999   COLONOSCOPY  2018   ESOPHAGOGASTRODUODENOSCOPY  2018   IR 3D INDEPENDENT WKST  09/01/2021   IR ANGIO INTRA EXTRACRAN SEL COM CAROTID INNOMINATE BILAT MOD SED  09/01/2021   IR ANGIO INTRA EXTRACRAN SEL INTERNAL CAROTID UNI R MOD SED  11/29/2021   IR ANGIO VERTEBRAL SEL VERTEBRAL BILAT MOD SED  09/01/2021   IR ANGIOGRAM FOLLOW UP STUDY  11/29/2021   IR CT HEAD LTD  11/29/2021   IR RADIOLOGIST EVAL & MGMT  08/21/2021   IR RADIOLOGIST EVAL & MGMT  09/07/2021   IR RADIOLOGIST EVAL & MGMT  12/19/2021   IR  TRANSCATH/EMBOLIZ  11/27/2021   RADIOLOGY WITH ANESTHESIA N/A 11/27/2021   Procedure: Drucilla Schmidt;  Surgeon: Luanne Bras, MD;  Location: [facility];  Service: Radiology;  Laterality: N/A;    Current Medications: No outpatient medications have been marked as taking for the 12/14/22 encounter (Appointment) with O'Neal, Cassie Freer, MD.     Allergies:    Zofran Alvis Lemmings hcl], Morphine and related, and Zofran [ondansetron]   Social History: Social History   Socioeconomic History   Marital status: Single    Spouse name: Not on file   Number of children: 3   Years of education: Not on file   Highest education level: Not on file  Occupational History   Not on file  Tobacco Use   Smoking status: Every Day    Packs/day: 0.50    Years: 10.00    Total pack years: 5.00    Types: Cigarettes   Smokeless tobacco: Never  Vaping Use   Vaping Use: Never used  Substance and Sexual Activity   Alcohol use: Not Currently   Drug use: Not Currently    Frequency: 1.0 times per week    Comment: Last use 11/18/21   Sexual activity: Not on file    Comment: Hysterectomy  Other Topics Concern   Not on file  Social History Narrative   Right handed   One  [employer]   Drinks coffee occasionally   Social Determinants  of Health   Financial Resource Strain: Not on file  Food Insecurity: Not on file  Transportation Needs: Not on file  Physical Activity: Not on file  Stress: Not on file  Social Connections: Not on file     Family History: The patient's ***family history includes Cervical cancer in her maternal aunt; Colon cancer in her sister; Colon polyps in her father; Diabetes in her father; Down syndrome in her son; Heart attack in her maternal grandmother and paternal grandmother; Heart disease in her father; Hyperlipidemia in her father; Hypertension in her father; Stomach cancer in her maternal aunt; Stroke in her maternal uncle; Throat cancer in her maternal uncle. There is no history of  Esophageal cancer or Rectal cancer.  ROS:   All other ROS reviewed and negative. Pertinent positives noted in the HPI.     EKGs/Labs/Other Studies Reviewed:   The following studies were personally reviewed by me today:  EKG:  EKG is *** ordered today.  The ekg ordered today demonstrates ***, and was personally reviewed by me.   Recent Labs: 01/29/2022: BNP 11.5; TSH 0.459 09/20/2022: ALT 13; Hemoglobin 14.5; Platelets 228 11/29/2022: BUN 10; Creatinine, Ser 0.64; Potassium 4.3; Sodium 138   Recent Lipid Panel No results found for: "CHOL", "TRIG", "HDL", "CHOLHDL", "VLDL", "LDLCALC", "LDLDIRECT"  Physical Exam:   VS:  There were no vitals taken for this visit.   Wt Readings from Last 3 Encounters:  11/16/22 107 lb (48.5 kg)  10/19/22 105 lb 9.6 oz (47.9 kg)  09/20/22 108 lb 7.5 oz (49.2 kg)    General: Well nourished, well developed, in no acute distress Head: Atraumatic, normal size  Eyes: PEERLA, EOMI  Neck: Supple, no JVD Endocrine: No thryomegaly Cardiac: Normal S1, S2; RRR; no murmurs, rubs, or gallops Lungs: Clear to auscultation bilaterally, no wheezing, rhonchi or rales  Abd: Soft, nontender, no hepatomegaly  Ext: No edema, pulses 2+ Musculoskeletal: No deformities, BUE and BLE strength normal and equal Skin: Warm and dry, no rashes   Neuro: Alert and oriented to person, place, time, and situation, CNII-XII grossly intact, no focal deficits  Psych: Normal mood and affect   ASSESSMENT:   Allison Leonard is a 47 y.o. female who presents for the following: No diagnosis found.  PLAN:   There are no diagnoses linked to this encounter.  {Are you ordering a CV Procedure (e.g. stress test, cath, DCCV, TEE, etc)?   Press F2        :[id number]}  Disposition: No follow-ups on file.  Medication Adjustments/Labs and Tests Ordered: Current medicines are reviewed at length with the patient today.  Concerns regarding medicines are outlined above.  No orders of the  defined types were placed in this encounter.  No orders of the defined types were placed in this encounter.   There are no Patient Instructions on file for this visit.   Time Spent with Patient: I have spent a total of *** minutes with patient reviewing hospital notes, telemetry, EKGs, labs and examining the patient as well as establishing an assessment and plan that was discussed with the patient.  > 50% of time was spent in direct patient care.  Signed, Addison Naegeli. Audie Box, MD, [facility]  [address], [place], [place] [address] [phone]  12/11/2022 9:41 AM

## 2022-12-14 ENCOUNTER — Ambulatory Visit: Payer: Medicaid Other | Attending: Cardiovascular Disease | Admitting: Cardiovascular Disease

## 2022-12-14 DIAGNOSIS — I050 Rheumatic mitral stenosis: Secondary | ICD-10-CM

## 2022-12-17 ENCOUNTER — Other Ambulatory Visit: Payer: Medicaid Other

## 2022-12-17 ENCOUNTER — Ambulatory Visit (HOSPITAL_COMMUNITY): Admission: RE | Admit: 2022-12-17 | Payer: Medicaid Other | Source: Ambulatory Visit

## 2022-12-17 ENCOUNTER — Ambulatory Visit: Payer: Medicaid Other | Admitting: Gastroenterology

## 2022-12-17 ENCOUNTER — Encounter: Payer: Self-pay | Admitting: Gastroenterology

## 2022-12-17 VITALS — BP 130/88 | HR 107 | Ht 62.0 in | Wt 108.0 lb

## 2022-12-17 DIAGNOSIS — R1013 Epigastric pain: Secondary | ICD-10-CM | POA: Insufficient documentation

## 2022-12-17 DIAGNOSIS — K5900 Constipation, unspecified: Secondary | ICD-10-CM | POA: Insufficient documentation

## 2022-12-17 DIAGNOSIS — R112 Nausea with vomiting, unspecified: Secondary | ICD-10-CM | POA: Insufficient documentation

## 2022-12-17 DIAGNOSIS — K900 Celiac disease: Secondary | ICD-10-CM

## 2022-12-17 MED ORDER — LUBIPROSTONE 24 MCG PO CAPS: 24.0000 ug | ORAL_CAPSULE | Freq: Two times a day (BID) | ORAL | 2 refills | Status: DC

## 2022-12-17 NOTE — Patient Instructions (Signed)
_______________________________________________________  If your blood pressure at your visit was 140/90 or greater, please contact your primary care physician to follow up on this.  _______________________________________________________  If you are age 47 or older, your body mass index should be between 23-30. Your Body mass index is 19.75 kg/m. If this is out of the aforementioned range listed, please consider follow up with your Primary Care Provider.  If you are age 64 or younger, your body mass index should be between 19-25. Your Body mass index is 19.75 kg/m. If this is out of the aformentioned range listed, please consider follow up with your Primary Care Provider.   ________________________________________________________  The [facility] providers would like to encourage you to use [facility] to communicate with providers for non-urgent requests or questions.  Due to long hold times on the telephone, sending your provider a message by [facility] may be a faster and more efficient way to get a response.  Please allow 48 business hours for a response.  Please remember that this is for non-urgent requests.  _______________________________________________________  Your provider has requested that you go to the basement level for lab work before leaving today. Press "B" on the elevator. The lab is located at the first door on the left as you exit the elevator.  We have sent the following medications to your pharmacy for you to pick up at your convenience: Amitiza 24 mcg twice daily with meals.  You have been scheduled for a CT scan of the abdomen at [facility], 1st floor Radiology. You are scheduled on 12/28/22 at 3:45 pm. You should arrive 15 minutes prior to your appointment time for registration.   1) Do not eat anything after 12:00 pm (4 hours prior to your test)   You may take any medications as prescribed with a small amount of water, if necessary. If you take any of the  following medications: METFORMIN, GLUCOPHAGE, GLUCOVANCE, AVANDAMET, RIOMET, FORTAMET, [place] MET, JANUMET, GLUMETZA or METAGLIP, you MAY be asked to HOLD this medication 48 hours AFTER the exam.   The purpose of you drinking the oral contrast is to aid in the visualization of your intestinal tract. The contrast solution may cause some diarrhea. Depending on your individual set of symptoms, you may also receive an intravenous injection of x-ray contrast/dye. Plan on being at [facility] for 45 minutes or longer, depending on the type of exam you are having performed.   If you have any questions regarding your exam or if you need to reschedule, you may call [facility] at [phone] between the hours of 8:00 am and 5:00 pm, Monday-Friday.     Thank you for entrusting me with your care and choosing [facility].

## 2022-12-17 NOTE — Progress Notes (Signed)
Reviewed and agree with management plans. ? ?Damontay Alred L. Etola Mull, MD, MPH  ?

## 2022-12-17 NOTE — Progress Notes (Signed)
12/17/2022 Allison Leonard [id number] [birth year]   HISTORY OF PRESENT ILLNESS: This is a 47 year old female who is a patient of Dr. Tarri Glenn.  She is here today with complaints of constipation and complaints of nausea with episodes of vomiting and epigastric abdominal pain.  She has had ongoing issues with constipation.  When she was seen here last in November 2022 she was doing well with Trulance 3 mg daily.  Since then that has become ineffective and she was just switched to Amitiza 8 mcg twice daily by her PCP about a month ago.  Says that that does not seem to be working.  She has also been using stool softeners and suppositories.  She also complains of epigastric abdominal pain.  Has a constant , at least every day nausea.  Has episodes of vomiting usually in the morning.  When she vomits she says she vomits typically only liquid in the mornings, not undigested food.  She had been scheduled for gastric emptying scan previously, but was not able to do that because she was not able to eat the egg that was presented to her so early in the morning.  She says that typically she does not eat breakfast and does not eat until usually around lunchtime/noon.  She takes famotidine 20 mg twice daily for acid reflux and feels that that is adequate to help her symptoms.  Colonoscopy in June 2021 she had a 10 to 12 mm polyp removed and a 2 mm polyp removed.  Pathology showed tubular adenomas as below.  Repeat was recommended at 3-year interval.  EGD in June 2021 showed erythema in the gastric body.  1. Surgical [P], duodenal bx - DUODENAL MUCOSA WITH NO SIGNIFICANT PATHOLOGIC FINDINGS. - NEGATIVE FOR INCREASE INTRAEPITHELIAL LYMPHOCYTES AND VILLOUS ARCHITECTURAL CHANGES. 2. Surgical [P], gastric antrum - GASTRIC ANTRAL MUCSOA WITH MILD REACTIVE GASTROPATHY. - WARTHIN-STARRY STAIN IS NEGATIVE FOR HELICOBACTER PYLORI. 3. Surgical [P], gastric body - GASTRIC OXYNTIC MUCOSA WITH MILD CHRONIC  GASTRITIS. - WARTHIN-STARRY STAIN IS NEGATIVE FOR HELICOBACTER PYLORI. 4. Surgical [P], distal esophagus - GASTROESOPHAGEAL JUNCTION MUCOSA WITH REACTIVE/REGENERATIVE CHANGES. - NEGATIVE FOR INTESTINAL METAPLASIA (GOBLET CELL METAPLASIA). - NEGATIVE FOR INCREASED INTRAEPITHELIAL EOSINOPHILS. 5. Surgical [P], mid/proximal esophagus - SQUAMOUS ESOPHAGEAL EPITHELIUM WITH NO SIGNIFICANT PATHOLOGIC FINDINGS. - NEGATIVE FOR INCREASED INTRAEPITHELIAL EOSINOPHILS. 6. Surgical [P], colon, cecum, polyp - TUBULAR ADENOMA. - NEGATIVE FOR HIGH GRADE DYSPLASIA. 7. Surgical [P], colon, descending, polyp - TUBULAR ADENOMA. - NEGATIVE FOR HIGH GRADE DYSPLASIA.  Sister with stage IV colon cancer diagnosed at the age of 13.    Past Medical History:  Diagnosis Date   Acid reflux 07/04/2018   Anemia    Aneurysm of unspecified site ([place]) 2023   Brain aneurysm 2022   Cannabinoid hyperemesis syndrome 10/27/2018   Celiac disease/sprue    Cyclic vomiting syndrome    Dyspnea    in past per patient 11/24/21   Generalized anxiety disorder 07/04/2018   History of colon polyps    last colonoscopy 2018 removed multiple benign polyps   Migraines 07/04/2018   Past Surgical History:  Procedure Laterality Date   ABDOMINAL HYSTERECTOMY  2006   APPENDECTOMY  1999   COLONOSCOPY  2018   ESOPHAGOGASTRODUODENOSCOPY  2018   IR 3D INDEPENDENT WKST  09/01/2021   IR ANGIO INTRA EXTRACRAN SEL COM CAROTID INNOMINATE BILAT MOD SED  09/01/2021   IR ANGIO INTRA EXTRACRAN SEL INTERNAL CAROTID UNI R MOD SED  11/29/2021   IR ANGIO VERTEBRAL SEL VERTEBRAL  BILAT MOD SED  09/01/2021   IR ANGIOGRAM FOLLOW UP STUDY  11/29/2021   IR CT HEAD LTD  11/29/2021   IR RADIOLOGIST EVAL & MGMT  08/21/2021   IR RADIOLOGIST EVAL & MGMT  09/07/2021   IR RADIOLOGIST EVAL & MGMT  12/19/2021   IR TRANSCATH/EMBOLIZ  11/27/2021   RADIOLOGY WITH ANESTHESIA N/A 11/27/2021   Procedure: Drucilla Schmidt;  Surgeon: Luanne Bras, MD;  Location:  [facility];  Service: Radiology;  Laterality: N/A;    reports that she has been smoking cigarettes. She has a 5.00 pack-year smoking history. She has never used smokeless tobacco. She reports that she does not currently use alcohol. She reports that she does not currently use drugs. Frequency: 1.00 time per week. family history includes Cervical cancer in her maternal aunt; Colon cancer in her sister; Colon polyps in her father; Diabetes in her father; Down syndrome in her son; Heart attack in her maternal grandmother and paternal grandmother; Heart disease in her father; Hyperlipidemia in her father; Hypertension in her father; Stomach cancer in her maternal aunt; Stroke in her maternal uncle; Throat cancer in her maternal uncle. Allergies  Allergen Reactions   Zofran [Ondansetron Hcl] Anaphylaxis   Morphine And Related Itching   Zofran [Ondansetron]       Outpatient Encounter Medications as of 12/17/2022  Medication Sig   AMITIZA 8 MCG capsule TAKE 1 CAPSULE (8 MCG TOTAL) BY MOUTH 2 (TWO) TIMES DAILY WITH A MEAL.   clopidogrel (PLAVIX) 75 MG tablet Take 0.5 tablets (37.5 mg total) by mouth daily.   dicyclomine (BENTYL) 20 MG tablet TAKE 1 TABLET BY MOUTH 3 TIMES A DAY AS NEEDED *NEED OFFICE VISIT*   Erenumab-aooe (AIMOVIG) 140 MG/ML SOAJ Inject 140 mg into the skin every 28 (twenty-eight) days.   famotidine (PEPCID) 20 MG tablet TAKE 1 TABLET BY MOUTH TWICE A DAY   ivabradine (CORLANOR) 5 MG TABS tablet Take tablets (34m) TWO hours prior to your cardiac CT scan.   metoprolol tartrate (LOPRESSOR) 100 MG tablet Take 1 tablet by mouth TWO hours prior to your cardiac CT scan.   nortriptyline (PAMELOR) 50 MG capsule TAKE 1 CAPSULE BY MOUTH AT BEDTIME.   Plecanatide (TRULANCE) 3 MG TABS Take 3 mg by mouth daily.   promethazine (PHENERGAN) 25 MG tablet TAKE 1 TABLET BY MOUTH EVERY 6 HOURS AS NEEDED FOR NAUSEA AND VOMITING FOR UP TO 7 DAYS   Rimegepant Sulfate (NURTEC) 75 MG TBDP Take 75 mg by mouth as  needed (Take 1 tab at the earlist onset of Migraine. max 1 tab in 24 hours).   No facility-administered encounter medications on file as of 12/17/2022.     REVIEW OF SYSTEMS  : All other systems reviewed and negative except where noted in the History of Present Illness.   PHYSICAL EXAM: BP 130/88   Pulse (!) 107   Ht 5' 2"$  (1.575 m)   Wt 108 lb (49 kg)   SpO2 97%   BMI 19.75 kg/m  General: Well developed AA female in no acute distress Head: Normocephalic and atraumatic Eyes:  Sclerae anicteric, conjunctiva pink. Ears: Normal auditory acuity Lungs: Clear throughout to auscultation; no W/R/R. Heart: Regular rate and rhythm; no M/R/G. Abdomen: Soft, non-distended.  BS present.  Epigastric TTP. Musculoskeletal: Symmetrical with no gross deformities  Skin: No lesions on visible extremities Extremities: No edema  Neurological: Alert oriented x 4, grossly non-focal Psychological:  Alert and cooperative. Normal mood and affect  ASSESSMENT AND PLAN: *Epigastric abdominal pain  with daily nausea and episodes of vomiting usually in the morning: She does not vomit undigested food in the mornings.  She is not diabetic.  They previously tried to do gastric emptying scan, but she says that she could not eat that early in the morning and could not eat the egg substance that was given to her so early in the morning.  Will get a CT scan of the abdomen as she is only tender in epigastrium.  Other recent extensive labs in November 2023 and January 2024 were unremarkable from a GI standpoint. *Constipation: Previously did well with Trulance 3 mg daily, but that started to become ineffective.  She was placed on Amitiza 8 mcg twice daily by her PCP about a month ago, but so far is not had good results with that.  Will increase Amitiza to 24 mcg twice daily with food.  New prescription sent to pharmacy. *History of celiac disease: Tells me she was diagnosed a few years ago before she moved here from  [place].  Small bowel biopsies in June 2021 were normal.  She says that she follows a gluten-free diet for the most part.  Will check celiac titers.   CC:  Gerrit Heck, MD

## 2022-12-18 LAB — TISSUE TRANSGLUTAMINASE, IGA: (tTG) Ab, IgA: <1 U/mL

## 2022-12-18 LAB — IGA: Immunoglobulin A: 131 mg/dL (ref 47–310)

## 2022-12-20 ENCOUNTER — Ambulatory Visit (HOSPITAL_COMMUNITY): Admission: RE | Admit: 2022-12-20 | Payer: Medicaid Other | Source: Ambulatory Visit

## 2022-12-26 ENCOUNTER — Telehealth (HOSPITAL_COMMUNITY): Payer: Self-pay | Admitting: Emergency Medicine

## 2022-12-26 NOTE — Telephone Encounter (Signed)
Attempted to call patient regarding upcoming cardiac CT appointment. °Left message on voicemail with name and callback number °Mishti Swanton RN Navigator Cardiac Imaging °[facility] °[phone] Office °[phone] Cell ° °

## 2022-12-26 NOTE — Telephone Encounter (Signed)
Reaching out to patient to offer assistance regarding upcoming cardiac imaging study; pt verbalizes understanding of appt date/time, parking situation and where to check in, pre-test NPO status and medications ordered, and verified current allergies; name and call back number provided for further questions should they arise Marchia Bond RN Navigator Cardiac Imaging [facility] [phone] office [phone] cell  Arrival 930 WC entrance 149m metoprolol + 132mivabradine  Tiny veins Aware contrast/ nitro

## 2022-12-27 ENCOUNTER — Ambulatory Visit (HOSPITAL_COMMUNITY)
Admission: RE | Admit: 2022-12-27 | Discharge: 2022-12-27 | Disposition: A | Discharge status: Home / Self Care | Payer: Medicaid Other | Source: Ambulatory Visit | Attending: Cardiovascular Disease | Admitting: Cardiovascular Disease

## 2022-12-27 DIAGNOSIS — R079 Chest pain, unspecified: Secondary | ICD-10-CM

## 2022-12-27 MED ORDER — DILTIAZEM HCL 25 MG/5ML IV SOLN
INTRAVENOUS | Status: AC
  Administered 2022-12-27: 10 mg via INTRAVENOUS
  Filled 2022-12-27: qty 5

## 2022-12-27 MED ORDER — DILTIAZEM HCL 25 MG/5ML IV SOLN: 10.0000 mg | Freq: Once | INTRAVENOUS | Status: AC

## 2022-12-27 MED ORDER — NITROGLYCERIN 0.4 MG SL SUBL
0.8000 mg | SUBLINGUAL_TABLET | Freq: Once | SUBLINGUAL | Status: AC
  Administered 2022-12-27: 0.8 mg via SUBLINGUAL

## 2022-12-27 MED ORDER — NITROGLYCERIN 0.4 MG SL SUBL
SUBLINGUAL_TABLET | SUBLINGUAL | Status: AC
  Filled 2022-12-27: qty 2

## 2022-12-27 MED ORDER — METOPROLOL TARTRATE 5 MG/5ML IV SOLN
INTRAVENOUS | Status: AC
  Administered 2022-12-27: 10 mg via INTRAVENOUS
  Filled 2022-12-27: qty 10

## 2022-12-27 MED ORDER — METOPROLOL TARTRATE 5 MG/5ML IV SOLN: 10.0000 mg | Freq: Once | INTRAVENOUS | Status: AC

## 2022-12-27 MED ORDER — IOHEXOL 350 MG/ML SOLN
100.0000 mL | Freq: Once | INTRAVENOUS | Status: AC | PRN
  Administered 2022-12-27: 100 mL via INTRAVENOUS

## 2022-12-28 ENCOUNTER — Ambulatory Visit (HOSPITAL_COMMUNITY): Admission: RE | Admit: 2022-12-28 | Payer: Medicaid Other | Source: Ambulatory Visit

## 2022-12-29 ENCOUNTER — Other Ambulatory Visit: Payer: Self-pay | Admitting: Student

## 2022-12-29 DIAGNOSIS — R1115 Cyclical vomiting syndrome unrelated to migraine: Secondary | ICD-10-CM

## 2023-01-15 ENCOUNTER — Other Ambulatory Visit (HOSPITAL_COMMUNITY): Payer: Self-pay | Admitting: Interventional Radiology

## 2023-01-15 DIAGNOSIS — I671 Cerebral aneurysm, nonruptured: Secondary | ICD-10-CM

## 2023-01-16 ENCOUNTER — Other Ambulatory Visit (HOSPITAL_COMMUNITY): Payer: Self-pay

## 2023-01-21 ENCOUNTER — Other Ambulatory Visit: Payer: Self-pay | Admitting: Student

## 2023-01-21 DIAGNOSIS — K5904 Chronic idiopathic constipation: Secondary | ICD-10-CM

## 2023-01-24 ENCOUNTER — Other Ambulatory Visit: Payer: Self-pay | Admitting: Student

## 2023-01-24 DIAGNOSIS — R1115 Cyclical vomiting syndrome unrelated to migraine: Secondary | ICD-10-CM

## 2023-01-25 ENCOUNTER — Ambulatory Visit (HOSPITAL_COMMUNITY)
Admission: RE | Admit: 2023-01-25 | Discharge: 2023-01-25 | Disposition: A | Discharge status: Home / Self Care | Payer: Medicaid Other | Source: Ambulatory Visit | Attending: Interventional Radiology | Admitting: Interventional Radiology

## 2023-01-25 DIAGNOSIS — I671 Cerebral aneurysm, nonruptured: Secondary | ICD-10-CM | POA: Diagnosis not present

## 2023-01-25 DIAGNOSIS — I639 Cerebral infarction, unspecified: Secondary | ICD-10-CM | POA: Diagnosis not present

## 2023-01-30 ENCOUNTER — Telehealth (HOSPITAL_COMMUNITY): Payer: Self-pay

## 2023-01-30 NOTE — Telephone Encounter (Signed)
Pt agreed to f/u in 6 months with an mra. Per Dr. Estanislado Pandy, will go to yearly f/u if the next scan is stable. AB

## 2023-02-08 NOTE — Progress Notes (Deleted)
Cardiology Office Note:   Date:  02/08/2023  NAME:  Allison Leonard    MRN: [id number] DOB:  10-09-1976   PCP:  Levin ErpJagadish, Mayuri, MD  Cardiologist:  None  Electrophysiologist:  None   Referring MD: Levin ErpJagadish, Mayuri, MD   No chief complaint on file. ***  History of Present Illness:   Allison Hackntoinette S Dubiel is a 47 y.o. female with a hx of systolic heart failure with recovery of ejection fraction, rheumatic mitral valve disease, mild stenosis, right ICA aneurysm status post embolization, hypertension who presents for follow-up.  Coronary CTA negative.  Calcium score 0.  Echocardiogram with inflammatory appearance of mitral valve.  Mild mitral stenosis reported.  Problem List Systolic HF -EF 40-45% 01/18/2022 -EF 50-55% 12/09/2022 -CCTA normal 12/30/2022 2. 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  Rheumatic MV disease -mild MS -mild AR 4. HTN 5. Migraine  Past Medical History: Past Medical History:  Diagnosis Date   Acid reflux 07/04/2018   Anemia    Aneurysm of unspecified site ([facility]) 2023   Brain aneurysm 2022   Cannabinoid hyperemesis syndrome 10/27/2018   Celiac disease/sprue    Cyclic vomiting syndrome    Dyspnea    in past per patient 11/24/21   Generalized anxiety disorder 07/04/2018   History of colon polyps    last colonoscopy 2018 removed multiple benign polyps   Migraines 07/04/2018    Past Surgical History: Past Surgical History:  Procedure Laterality Date   ABDOMINAL HYSTERECTOMY  2006   APPENDECTOMY  1999   COLONOSCOPY  2018   ESOPHAGOGASTRODUODENOSCOPY  2018   IR 3D INDEPENDENT WKST  09/01/2021   IR ANGIO INTRA EXTRACRAN SEL COM CAROTID INNOMINATE BILAT MOD SED  09/01/2021   IR ANGIO INTRA EXTRACRAN SEL INTERNAL CAROTID UNI R MOD SED  11/29/2021   IR ANGIO VERTEBRAL SEL VERTEBRAL BILAT MOD SED  09/01/2021   IR ANGIOGRAM FOLLOW UP STUDY  11/29/2021   IR CT HEAD LTD  11/29/2021   IR RADIOLOGIST EVAL & MGMT  08/21/2021   IR RADIOLOGIST EVAL  & MGMT  09/07/2021   IR RADIOLOGIST EVAL & MGMT  12/19/2021   IR TRANSCATH/EMBOLIZ  11/27/2021   RADIOLOGY WITH ANESTHESIA N/A 11/27/2021   Procedure: Otho BellowsEMBLOIZATION;  Surgeon: Julieanne Cottoneveshwar, Sanjeev, MD;  Location: [facility];  Service: Radiology;  Laterality: N/A;    Current Medications: No outpatient medications have been marked as taking for the 02/11/23 encounter (Appointment) with O'Neal, Ronnald RampWesley Thomas, MD.     Allergies:    Zofran Frazier Richards[ondansetron hcl], Morphine and related, and Zofran [ondansetron]   Social History: Social History   Socioeconomic History   Marital status: Single    Spouse name: Not on file   Number of children: 3   Years of education: Not on file   Highest education level: Not on file  Occupational History   Not on file  Tobacco Use   Smoking status: Every Day    Packs/day: 0.50    Years: 10.00    Additional pack years: 0.00    Total pack years: 5.00    Types: Cigarettes   Smokeless tobacco: Never  Vaping Use   Vaping Use: Never used  Substance and Sexual Activity   Alcohol use: Not Currently   Drug use: Not Currently    Frequency: 1.0 times per week    Comment: Last use 11/18/21   Sexual activity: Not on file    Comment: Hysterectomy  Other Topics Concern   Not on file  Social History  Narrative   Right handed   [employer]   Drinks coffee occasionally   Social Determinants of Health   Financial Resource Strain: Not on file  Food Insecurity: Not on file  Transportation Needs: Not on file  Physical Activity: Not on file  Stress: Not on file  Social Connections: Not on file     Family History: The patient's ***family history includes Cervical cancer in her maternal aunt; Colon cancer in her sister; Colon polyps in her father; Diabetes in her father; Down syndrome in her son; Heart attack in her maternal grandmother and paternal grandmother; Heart disease in her father; Hyperlipidemia in her father; Hypertension in her father; Stomach cancer in her  maternal aunt; Stroke in her maternal uncle; Throat cancer in her maternal uncle. There is no history of Esophageal cancer or Rectal cancer.  ROS:   All other ROS reviewed and negative. Pertinent positives noted in the HPI.     EKGs/Labs/Other Studies Reviewed:   The following studies were personally reviewed by me today:  EKG:  EKG is *** ordered today.  The ekg ordered today demonstrates ***, and was personally reviewed by me.   CCTA 12/30/2022 IMPRESSION: 1. Coronary calcium score of 0.   2. Normal coronary origin with right dominance.   3. Normal coronary arteries.   RECOMMENDATIONS: 1. No evidence of CAD (0%). Consider non-atherosclerotic causes of chest pain.  TTE 12/09/2022  1. Left ventricular ejection fraction, by estimation, is 50 to 55%. The  left ventricle has low normal function. The left ventricle has no regional  wall motion abnormalities. There is mild asymmetric left ventricular  hypertrophy of the infero-lateral  segment. Left ventricular diastolic parameters are indeterminate. There is  abnormal septal motion with thinning of the basal anteroseptum.   2. Right ventricular systolic function is normal. The right ventricular  size is normal. There is normal pulmonary artery systolic pressure. The  estimated right ventricular systolic pressure is 22.0 mmHg.   3. A small pericardial effusion is present.   4. Mild post-inflammatory appearance of the mitral valve. Diastolic  mitral gradient poorly interrogated and HR 100 at time of acquisition.  Challenging to interpret but possible mild mitral stenosis. The mitral  valve is abnormal. Trivial mitral valve  regurgitation. The mean mitral valve gradient is 7.0 mmHg with average  heart rate of 100 bpm.   5. The aortic valve is tricuspid. Aortic valve regurgitation is mild. No  aortic stenosis is present.   6. The inferior vena cava is normal in size with greater than 50%  respiratory variability, suggesting right  atrial pressure of 3 mmHg.   Recent Labs: 09/20/2022: ALT 13; Hemoglobin 14.5; Platelets 228 11/29/2022: BUN 10; Creatinine, Ser 0.64; Potassium 4.3; Sodium 138   Recent Lipid Panel No results found for: "CHOL", "TRIG", "HDL", "CHOLHDL", "VLDL", "LDLCALC", "LDLDIRECT"  Physical Exam:   VS:  There were no vitals taken for this visit.   Wt Readings from Last 3 Encounters:  12/17/22 108 lb (49 kg)  11/16/22 107 lb (48.5 kg)  10/19/22 105 lb 9.6 oz (47.9 kg)    General: Well nourished, well developed, in no acute distress Head: Atraumatic, normal size  Eyes: PEERLA, EOMI  Neck: Supple, no JVD Endocrine: No thryomegaly Cardiac: Normal S1, S2; RRR; no murmurs, rubs, or gallops Lungs: Clear to auscultation bilaterally, no wheezing, rhonchi or rales  Abd: Soft, nontender, no hepatomegaly  Ext: No edema, pulses 2+ Musculoskeletal: No deformities, BUE and BLE strength normal and equal  Skin: Warm and dry, no rashes   Neuro: Alert and oriented to person, place, time, and situation, CNII-XII grossly intact, no focal deficits  Psych: Normal mood and affect   ASSESSMENT:   ELISEA DELCOUR is a 47 y.o. female who presents for the following: No diagnosis found.  PLAN:   There are no diagnoses linked to this encounter.  {Are you ordering a CV Procedure (e.g. stress test, cath, DCCV, TEE, etc)?   Press F2        :[id number]}  Disposition: No follow-ups on file.  Medication Adjustments/Labs and Tests Ordered: Current medicines are reviewed at length with the patient today.  Concerns regarding medicines are outlined above.  No orders of the defined types were placed in this encounter.  No orders of the defined types were placed in this encounter.   There are no Patient Instructions on file for this visit.   Time Spent with Patient: I have spent a total of *** minutes with patient reviewing hospital notes, telemetry, EKGs, labs and examining the patient as well as establishing an  assessment and plan that was discussed with the patient.  > 50% of time was spent in direct patient care.  Signed, Lenna Gilford. Flora Lipps, MD, [facility]  [facility]  [address], [address] [place], [place] [address] [phone]  02/08/2023 4:09 PM

## 2023-02-11 ENCOUNTER — Ambulatory Visit: Payer: Medicaid Other | Admitting: Cardiovascular Disease

## 2023-02-11 DIAGNOSIS — I5022 Chronic systolic (congestive) heart failure: Secondary | ICD-10-CM

## 2023-02-11 DIAGNOSIS — Z720 Tobacco use: Secondary | ICD-10-CM

## 2023-02-11 DIAGNOSIS — I050 Rheumatic mitral stenosis: Secondary | ICD-10-CM

## 2023-02-15 ENCOUNTER — Other Ambulatory Visit (HOSPITAL_COMMUNITY): Payer: Self-pay

## 2023-02-15 ENCOUNTER — Telehealth: Payer: Self-pay

## 2023-02-15 NOTE — Telephone Encounter (Signed)
PA renewal request received via CMM for Nurtec 75MG  dispersible tablets  PA submitted to [facility] [place] Medicaid and is pending determination  Patient is contraindicated to Triptans  Has 10-15 headaches per month.  Key: [id number]

## 2023-02-18 ENCOUNTER — Other Ambulatory Visit: Payer: Self-pay | Admitting: Student

## 2023-02-18 DIAGNOSIS — R1115 Cyclical vomiting syndrome unrelated to migraine: Secondary | ICD-10-CM

## 2023-02-25 NOTE — Telephone Encounter (Signed)
Patient Advocate Encounter  Received a fax from [facility] [place] Medicaid regarding Prior Authorization for Nurtec  dispersible tablets.   Key: [id number]  Authorization has been DENIED due to

## 2023-02-27 NOTE — Telephone Encounter (Signed)
LMOVM ma have samples PerDr.Jaffe to hold her over until her appt next month.

## 2023-03-05 ENCOUNTER — Other Ambulatory Visit: Payer: Self-pay | Admitting: Student

## 2023-03-05 DIAGNOSIS — R1115 Cyclical vomiting syndrome unrelated to migraine: Secondary | ICD-10-CM

## 2023-03-27 NOTE — Progress Notes (Deleted)
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  *** Limit use of pain relievers to no more than 2 days out of week to prevent risk of rebound or medication-overuse headache. Keep headache diary Follow up ***     Subjective:  Allison Leonard is a 47 year old female with Celiac disease and Cyclic Vomiting Syndrome who follows up for migraines.   UPDATE: Last visit, started Aimovig Intensity:  8-8.5/10 Duration:  30 minutes with Nurtec *** Frequency:  *** Current NSAIDS:  none Current analgesics:  none Current triptans:  contraindicated - cerebral aneurysms, history of cerebral venous thrombosis Current ergotamine:  none Current anti-emetic:  none Current muscle relaxants:  none Current sleep aide:  none Current Antihypertensive medications:  none Current Antidepressant medications:  nortriptyline 25mg  QHS Current Anticonvulsant medications: none Current anti-CGRP:  Aimovig 140mg , Nurtec PRN Current Vitamins/Herbal/Supplements:  none Current Antihistamines/Decongestants:  none Other therapy:  none Hormone/birth control:  none   Caffeine:  Coffee 1 to 2 times a month.  No soda Exercise:  no Depression:  no; Anxiety:  no Other pain:  Sometimes abdominal discomfort Sleep hygiene:  Restless   HISTORY:  Started having headaches at age 27.  Worse over the past 3 to 4 years.  They are mostly severe sharp or pounding headache in her right temple.  Usually lasts a day.  Sometimes they wake her up at night.  17 days over last 30 days.  Nausea, vomiting, photophobia, phonophobia,  blurred vision.  No numbness or weakness.  No specific triggers.  Vomiting may help make it better.  MRI brain with and without contrast on 06/03/2020 personally reviewed showed no acute intracranial abnormality but did show chronic nonocclusive thrombus in the superior sagittal sinus; 1-3 mm outpouchings involving the right cavernous ICA, left ICA terminus, proximal basilar artery and right P1 segment; and mild to moderate bilateral A1 segment narrowing.  Follow up CTA of head on 06/20/2020 personally reviewed redemonstrated 1-2 mm distal cavernous/paraclinoid right ICA aneurysm and 2 mm proximal basilar artery aneurysm but previously seen small aneurysms of the right P1 PCA and left ICA terminus not appreciated.  She was referred to interventional radiology at that time but she never received a call.  Hypercoagulable panel including Factor V Leiden gene mutation, from June and August 2021 was negative.  Repeat CTA head performed 08/02/2021 personally reviewed again demonstrated 1-2 mm right paraclinoid ICA aneurysm vs infundibulum and 2 mm proximal basilar artery aneurysm vs fenestration, unchanged from prior study.  She was referred to endovascular interventional radiology.  On 11/27/2021, she underwent embolization with pipeline shield flow diverter device for the right ICA aneurysm.  She was discharged but she had a syncopal episode where she developed a migraine afterwards, prompting her to go to the ED.  CTA head and neck evealed no postembolization complications.  She was treated with a headache cocktail.  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    She had been experiencing sinus tachycardia.  Echo revealed EF 40-45%.  Referred to  cardiology for possible heart failure, who reviewed echo and  believed the EF was actually within normal range of 50-55%.    Past NSAIDS:  ibuprofen Past analgesics:  Fioricet, Excedrin Past abortive triptans:  sumatriptan 100mg  Past abortive ergotamine:  none Past muscle relaxants:  none Past anti-emetic:  Compazine, Reglan 10mg ; promethazine 12.5mg  Past antihypertensive medications:  none Past antidepressant medications:  none Past anticonvulsant medications:  topiramate Past anti-CGRP:  Bernita Raisin Past vitamins/Herbal/Supplements:  none Past antihistamines/decongestants:  Benadryl Other past therapies:  none   Family history of headache:  Brother, sister, mother, daughter  PAST MEDICAL HISTORY: Past Medical History:  Diagnosis Date   Acid reflux 07/04/2018   Anemia    Aneurysm of unspecified site [facility]) 2023   Brain aneurysm 2022   Cannabinoid hyperemesis syndrome 10/27/2018   Celiac disease/sprue    Cyclic vomiting syndrome    Dyspnea    in past per patient 11/24/21   Generalized anxiety disorder 07/04/2018   History of colon polyps    last colonoscopy 2018 removed multiple benign polyps   Migraines 07/04/2018    MEDICATIONS: Current Outpatient Medications on File Prior to Visit  Medication Sig Dispense Refill   clopidogrel (PLAVIX) 75 MG tablet Take 0.5 tablets (37.5 mg total) by mouth daily. 90 tablet 0   dicyclomine (BENTYL) 20 MG tablet TAKE 1 TABLET BY MOUTH 3 TIMES A DAY AS NEEDED *NEED OFFICE VISIT* 270 tablet 0   Erenumab-aooe (AIMOVIG) 140 MG/ML SOAJ Inject 140 mg into the skin every 28 (twenty-eight) days. 1.12 mL 11   famotidine (PEPCID) 20 MG tablet TAKE 1 TABLET BY MOUTH TWICE A DAY 180 tablet 3   ivabradine (CORLANOR) 5 MG TABS tablet Take tablets (10mg ) TWO hours prior to your cardiac CT scan. 2 tablet 0   lubiprostone (AMITIZA) 24 MCG capsule Take 1 capsule (24 mcg total) by mouth 2 (two) times daily with a meal. 60 capsule 2   metoprolol tartrate (LOPRESSOR) 100 MG tablet Take 1 tablet by mouth TWO hours prior to  your cardiac CT scan. 1 tablet 0   nortriptyline (PAMELOR) 50 MG capsule TAKE 1 CAPSULE BY MOUTH AT BEDTIME. 90 capsule 2   Plecanatide (TRULANCE) 3 MG TABS Take 3 mg by mouth daily. 30 tablet 11   promethazine (PHENERGAN) 25 MG tablet TAKE 1 TABLET BY MOUTH EVERY 6 HOURS AS NEEDED FOR NAUSEA AND VOMITING FOR UP TO 7 DAYS 28 tablet 0   Rimegepant Sulfate (NURTEC) 75 MG TBDP Take 75 mg by mouth as needed (Take 1 tab at the earlist onset of Migraine. max 1 tab in 24 hours). 16 tablet 5   No current facility-administered medications on file prior to visit.    ALLERGIES: Allergies  Allergen Reactions   Zofran [Ondansetron Hcl] Anaphylaxis   Morphine And Codeine Itching   Zofran [Ondansetron]     FAMILY HISTORY: Family History  Problem Relation Age of Onset   Heart disease Father    Diabetes Father    Hypertension Father    Hyperlipidemia Father    Colon polyps Father    Colon cancer Sister    Cervical cancer Maternal Aunt    Stomach cancer Maternal Aunt    Throat cancer Maternal Uncle    Stroke Maternal Uncle    Heart attack Maternal Grandmother    Heart attack Paternal Grandmother    Down syndrome Son    Esophageal cancer Neg Hx    Rectal cancer Neg Hx  Objective:  *** General: No acute distress.  Patient appears well-groomed.   Head:  Normocephalic/atraumatic Eyes:  Fundi examined but not visualized Neck: supple, no paraspinal tenderness, full range of motion Heart:  Regular rate and rhythm Neurological Exam: ***   Shon Millet, DO  CC: Levin Erp, MD

## 2023-03-27 NOTE — Progress Notes (Signed)
Cardiology Office Note:   Date:  03/29/2023  NAME:  Allison Leonard    MRN: [id number] DOB:  [birth year]   PCP:  Levin Erp, MD  Cardiologist:  None  Electrophysiologist:  None   Referring MD: Levin Erp, MD   Chief Complaint  Patient presents with   Follow-up         History of Present Illness:   Allison Leonard is a 47 y.o. female with a hx of systolic heart failure with recovered ejection fraction, right ICA aneurysm status post embolization, rheumatic valvular heart disease who presents for follow-up.  She reports she is doing well.  She has quit smoking.  No longer having chest pain.  Ejection fraction has improved to 50-55%.  Blood pressure is well-controlled.  Without any complaints today.  Mitral stenosis noted but not significant.  We will plan to follow this with yearly echo.  She is without any major complaints today.  Overall doing well.  Problem List Systolic HF -01/18/2022 EF 40-45% -12/07/2022 EF 50-55% -normal coronary arteries  2. 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 HTN 5. Migraine  Past Medical History: Past Medical History:  Diagnosis Date   Acid reflux 07/04/2018   Anemia    Aneurysm of unspecified site (HCC) 2023   Brain aneurysm 2022   Cannabinoid hyperemesis syndrome 10/27/2018   Celiac disease/sprue    Cyclic vomiting syndrome    Dyspnea    in past per patient 11/24/21   Generalized anxiety disorder 07/04/2018   History of colon polyps    last colonoscopy 2018 removed multiple benign polyps   Migraines 07/04/2018    Past Surgical History: Past Surgical History:  Procedure Laterality Date   ABDOMINAL HYSTERECTOMY  2006   APPENDECTOMY  1999   COLONOSCOPY  2018   ESOPHAGOGASTRODUODENOSCOPY  2018   IR 3D INDEPENDENT WKST  09/01/2021   IR ANGIO INTRA EXTRACRAN SEL COM CAROTID INNOMINATE BILAT MOD SED  09/01/2021   IR ANGIO INTRA EXTRACRAN SEL INTERNAL CAROTID  UNI R MOD SED  11/29/2021   IR ANGIO VERTEBRAL SEL VERTEBRAL BILAT MOD SED  09/01/2021   IR ANGIOGRAM FOLLOW UP STUDY  11/29/2021   IR CT HEAD LTD  11/29/2021   IR RADIOLOGIST EVAL & MGMT  08/21/2021   IR RADIOLOGIST EVAL & MGMT  09/07/2021   IR RADIOLOGIST EVAL & MGMT  12/19/2021   IR TRANSCATH/EMBOLIZ  11/27/2021   RADIOLOGY WITH ANESTHESIA N/A 11/27/2021   Procedure: Otho Bellows;  Surgeon: Julieanne Cotton, MD;  Location: [facility] OR;  Service: Radiology;  Laterality: N/A;    Current Medications: Current Meds  Medication Sig   clopidogrel (PLAVIX) 75 MG tablet Take 0.5 tablets (37.5 mg total) by mouth daily.   dicyclomine (BENTYL) 20 MG tablet TAKE 1 TABLET BY MOUTH 3 TIMES A DAY AS NEEDED *NEED OFFICE VISIT*   Erenumab-aooe (AIMOVIG) 140 MG/ML SOAJ Inject 140 mg into the skin every 28 (twenty-eight) days.   famotidine (PEPCID) 20 MG tablet TAKE 1 TABLET BY MOUTH TWICE A DAY   lubiprostone (AMITIZA) 24 MCG capsule Take 1 capsule (24 mcg total) by mouth 2 (two) times daily with a meal.   promethazine (PHENERGAN) 25 MG tablet TAKE 1 TABLET BY MOUTH EVERY 6 HOURS AS NEEDED FOR NAUSEA AND VOMITING FOR UP TO 7 DAYS   Rimegepant Sulfate (NURTEC) 75 MG TBDP Take 75 mg by mouth as needed (Take 1 tab at the earlist onset of Migraine.  max 1 tab in 24 hours).     Allergies:    Zofran [ondansetron hcl], Morphine and codeine, and Zofran [ondansetron]   Social History: Social History   Socioeconomic History   Marital status: Single    Spouse name: Not on file   Number of children: 3   Years of education: Not on file   Highest education level: Not on file  Occupational History   Not on file  Tobacco Use   Smoking status: Former    Packs/day: 0.50    Years: 10.00    Additional pack years: 0.00    Total pack years: 5.00    Types: Cigarettes    Quit date: 01/03/2023    Years since quitting: 0.2   Smokeless tobacco: Never  Vaping Use   Vaping Use: Never used  Substance and Sexual  Activity   Alcohol use: Not Currently   Drug use: Not Currently    Frequency: 1.0 times per week    Comment: Last use 11/18/21   Sexual activity: Not on file    Comment: Hysterectomy  Other Topics Concern   Not on file  Social History Narrative   Right handed   [employer]   Drinks coffee occasionally   Social Determinants of Health   Financial Resource Strain: Not on file  Food Insecurity: Not on file  Transportation Needs: Not on file  Physical Activity: Not on file  Stress: Not on file  Social Connections: Not on file     Family History: The patient's family history includes Cervical cancer in her maternal aunt; Colon cancer in her sister; Colon polyps in her father; Diabetes in her father; Down syndrome in her son; Heart attack in her maternal grandmother and paternal grandmother; Heart disease in her father; Hyperlipidemia in her father; Hypertension in her father; Stomach cancer in her maternal aunt; Stroke in her maternal uncle; Throat cancer in her maternal uncle. There is no history of Esophageal cancer or Rectal cancer.  ROS:   All other ROS reviewed and negative. Pertinent positives noted in the HPI.     EKGs/Labs/Other Studies Reviewed:   The following studies were personally reviewed by me today:   Recent Labs: 09/20/2022: ALT 13; Hemoglobin 14.5; Platelets 228 11/29/2022: BUN 10; Creatinine, Ser 0.64; Potassium 4.3; Sodium 138   Recent Lipid Panel No results found for: "CHOL", "TRIG", "HDL", "CHOLHDL", "VLDL", "LDLCALC", "LDLDIRECT"  Physical Exam:   VS:  BP 106/74 (BP Location: Left Arm, Patient Position: Sitting, Cuff Size: Normal)   Pulse 100   Ht 5\' 2"  (1.575 m)   Wt 104 lb 3.2 oz (47.3 kg)   SpO2 96%   BMI 19.06 kg/m    Wt Readings from Last 3 Encounters:  03/29/23 104 lb 3.2 oz (47.3 kg)  12/17/22 108 lb (49 kg)  11/16/22 107 lb (48.5 kg)    General: Well nourished, well developed, in no acute distress Head: Atraumatic, normal size  Eyes:  PEERLA, EOMI  Neck: Supple, no JVD Endocrine: No thryomegaly Cardiac: Normal S1, S2; RRR; faint diastolic rumble Lungs: Clear to auscultation bilaterally, no wheezing, rhonchi or rales  Abd: Soft, nontender, no hepatomegaly  Ext: No edema, pulses 2+ Musculoskeletal: No deformities, BUE and BLE strength normal and equal Skin: Warm and dry, no rashes   Neuro: Alert and oriented to person, place, time, and situation, CNII-XII grossly intact, no focal deficits  Psych: Normal mood and affect   ASSESSMENT:   Allison Leonard is a 47 y.o. female  who presents for the following: 1. Rheumatic mitral stenosis   2. Chronic systolic heart failure (HCC)     PLAN:   1. Rheumatic mitral stenosis -Mild rheumatic mitral valve stenosis.  No symptoms from this.  We will follow this with yearly echo.  2. Chronic systolic heart failure (HCC) -EF has recovered to 55%.  Blood pressure controlled.  On no medications.  Suspect her echo was read incorrectly possibly 1 year ago.  We will follow this.  Without symptoms today.  Doing well.  Disposition: Return in about 1 year (around 03/28/2024).  Medication Adjustments/Labs and Tests Ordered: Current medicines are reviewed at length with the patient today.  Concerns regarding medicines are outlined above.  Orders Placed This Encounter  Procedures   ECHOCARDIOGRAM COMPLETE   No orders of the defined types were placed in this encounter.   Patient Instructions  Medication Instructions:  The current medical regimen is effective;  continue present plan and medications.  *If you need a refill on your cardiac medications before your next appointment, please call your pharmacy*   Testing/Procedures: Echocardiogram (12 months) - Your physician has requested that you have an echocardiogram. Echocardiography is a painless test that uses sound waves to create images of your heart. It provides your doctor with information about the size and shape of your  heart and how well your heart's chambers and valves are working. This procedure takes approximately one hour. There are no restrictions for this procedure.     Follow-Up: At [facility], you and your health needs are our priority.  As part of our continuing mission to provide you with exceptional heart care, we have created designated Provider Care Teams.  These Care Teams include your primary Cardiologist (physician) and Advanced Practice Providers (APPs -  Physician Assistants and Nurse Practitioners) who all work together to provide you with the care you need, when you need it.  We recommend signing up for the patient portal called "MyChart".  Sign up information is provided on this After Visit Summary.  MyChart is used to connect with patients for Virtual Visits (Telemedicine).  Patients are able to view lab/test results, encounter notes, upcoming appointments, etc.  Non-urgent messages can be sent to your provider as well.   To learn more about what you can do with MyChart, go to [email or web address].    Your next appointment:   12 month(s)  Provider:   Lennie Odor, MD       Time Spent with Patient: I have spent a total of 25 minutes with patient reviewing hospital notes, telemetry, EKGs, labs and examining the patient as well as establishing an assessment and plan that was discussed with the patient.  > 50% of time was spent in direct patient care.  Signed, Lenna Gilford. Flora Lipps, MD, [facility]  [facility]  [address], [address] [place], [place] [address] [phone]  03/29/2023 1:23 PM

## 2023-03-29 ENCOUNTER — Ambulatory Visit: Payer: Medicaid Other | Admitting: Neurology

## 2023-03-29 ENCOUNTER — Other Ambulatory Visit: Payer: Self-pay | Admitting: Radiology

## 2023-03-29 ENCOUNTER — Ambulatory Visit: Payer: Medicaid Other | Attending: Cardiovascular Disease | Admitting: Cardiovascular Disease

## 2023-03-29 ENCOUNTER — Encounter: Payer: Self-pay | Admitting: Cardiovascular Disease

## 2023-03-29 VITALS — BP 106/74 | HR 100 | Ht 62.0 in | Wt 104.2 lb

## 2023-03-29 DIAGNOSIS — I050 Rheumatic mitral stenosis: Secondary | ICD-10-CM | POA: Diagnosis not present

## 2023-03-29 DIAGNOSIS — I5022 Chronic systolic (congestive) heart failure: Secondary | ICD-10-CM | POA: Diagnosis not present

## 2023-03-29 MED ORDER — CLOPIDOGREL BISULFATE 75 MG PO TABS: 37.5000 mg | ORAL_TABLET | Freq: Every day | ORAL | 0 refills | Status: DC

## 2023-03-29 NOTE — Patient Instructions (Signed)
Medication Instructions:  The current medical regimen is effective;  continue present plan and medications.  *If you need a refill on your cardiac medications before your next appointment, please call your pharmacy*   Testing/Procedures: Echocardiogram (12 months) - Your physician has requested that you have an echocardiogram. Echocardiography is a painless test that uses sound waves to create images of your heart. It provides your doctor with information about the size and shape of your heart and how well your heart's chambers and valves are working. This procedure takes approximately one hour. There are no restrictions for this procedure.     Follow-Up: At [facility], you and your health needs are our priority.  As part of our continuing mission to provide you with exceptional heart care, we have created designated Provider Care Teams.  These Care Teams include your primary Cardiologist (physician) and Advanced Practice Providers (APPs -  Physician Assistants and Nurse Practitioners) who all work together to provide you with the care you need, when you need it.  We recommend signing up for the patient portal called "MyChart".  Sign up information is provided on this After Visit Summary.  MyChart is used to connect with patients for Virtual Visits (Telemedicine).  Patients are able to view lab/test results, encounter notes, upcoming appointments, etc.  Non-urgent messages can be sent to your provider as well.   To learn more about what you can do with MyChart, go to [email or web address].    Your next appointment:   12 month(s)  Provider:   [place] O'Neal, MD     

## 2023-04-01 ENCOUNTER — Other Ambulatory Visit: Payer: Self-pay | Admitting: Student

## 2023-04-01 DIAGNOSIS — R1115 Cyclical vomiting syndrome unrelated to migraine: Secondary | ICD-10-CM

## 2023-04-03 ENCOUNTER — Encounter: Payer: Self-pay | Admitting: Gastroenterology

## 2023-04-25 ENCOUNTER — Other Ambulatory Visit: Payer: Self-pay | Admitting: Student

## 2023-04-25 DIAGNOSIS — R1115 Cyclical vomiting syndrome unrelated to migraine: Secondary | ICD-10-CM

## 2023-05-06 ENCOUNTER — Telehealth: Payer: Self-pay | Admitting: Gastroenterology

## 2023-05-06 NOTE — Telephone Encounter (Signed)
Inbound call from patient requesting to schedule colonoscopy. Is taking Plavix, was last seen in the office on 12/17/22. Requesting to know if she can be directly scheduled for colonoscopy. Please advise, thank you.

## 2023-05-07 ENCOUNTER — Encounter: Payer: Self-pay | Admitting: Gastroenterology

## 2023-05-07 ENCOUNTER — Telehealth: Payer: Self-pay

## 2023-05-07 NOTE — Telephone Encounter (Signed)
Allison Leonard can this pt be set up for a recall colon since she is due but on plavix?  She saw you in Feb of this year.

## 2023-05-07 NOTE — Telephone Encounter (Signed)
   Patient Name: ELIZAVETA PICINICH  DOB: 06/06/1976 MRN: [id number]  Primary Cardiologist: None  Chart reviewed as part of pre-operative protocol coverage.   Please advise that Plavix is not managed by cardiology and guidance for holding should come from prescribing provider.  Please let us know if you have any further questions.   Napoleon Form, Leodis Rains, NP 05/07/2023, 12:25 PM

## 2023-05-07 NOTE — Telephone Encounter (Signed)
Yes. Patient is scheduled for colonoscopy 8/15 with Dr. Russella Dar and [facility] 7/25.

## 2023-05-07 NOTE — Telephone Encounter (Signed)
Noted anti coag letter has been sent to the prescriber

## 2023-05-07 NOTE — Telephone Encounter (Signed)
Is she scheduled for the previsit and colon?

## 2023-05-07 NOTE — Telephone Encounter (Signed)
[facility] Pre-operative Risk Assessment     Request for surgical clearance:     Endoscopy Procedure  What type of surgery is being performed?     Colon   When is this surgery scheduled?     TBD  What type of clearance is required ?   Pharmacy  Are there any medications that need to be held prior to surgery and how long? Plavix  Practice name and name of physician performing surgery?      [facility]  What is your office phone and fax number?      Phone- [phone]  Fax- [phone]  Anesthesia type (None, local, MAC, general) ?       MAC

## 2023-05-07 NOTE — Telephone Encounter (Signed)
Called patient to advised of clearance of Plavix. States she will get into contact with provider for clearance.

## 2023-05-08 ENCOUNTER — Other Ambulatory Visit: Payer: Self-pay

## 2023-05-08 NOTE — Telephone Encounter (Signed)
Received a response from [facility] that they do not prescribe Plavix. I have sent the request to Dr Anders Grant for help with this.

## 2023-05-13 NOTE — Telephone Encounter (Signed)
Recd clearance back from -patient may hold Plavix x5 days prior to procedure. Please continue ASA. Clearance will be sent to scanning. Copy of letter placed in PV visit folder RM [id number]. Patient is scheduled for PV on 05/30/23 at 11:00 am.

## 2023-05-21 ENCOUNTER — Other Ambulatory Visit: Payer: Self-pay | Admitting: Student

## 2023-05-21 ENCOUNTER — Other Ambulatory Visit: Payer: Self-pay | Admitting: Neurology

## 2023-05-21 DIAGNOSIS — R1115 Cyclical vomiting syndrome unrelated to migraine: Secondary | ICD-10-CM

## 2023-05-30 ENCOUNTER — Ambulatory Visit: Payer: Medicaid Other | Admitting: *Deleted

## 2023-05-30 VITALS — Ht 62.0 in | Wt 108.0 lb

## 2023-05-30 DIAGNOSIS — Z86010 Personal history of colonic polyps: Secondary | ICD-10-CM

## 2023-05-30 DIAGNOSIS — Z800 Family history of malignant neoplasm of digestive organs: Secondary | ICD-10-CM

## 2023-05-30 MED ORDER — NA SULFATE-K SULFATE-MG SULF 17.5-3.13-1.6 GM/177ML PO SOLN
1.0000 | Freq: Once | ORAL | 0 refills | Status: AC
Start: 2023-05-30 — End: 2023-05-30

## 2023-05-30 NOTE — Progress Notes (Signed)
Pt's name and DOB verified at the beginning of the pre-visit.  Pt denies any difficulty with ambulating,sitting, laying down or rolling side to side Gave both [facility] main # and MD on call # prior to instructions.  No egg or soy allergy known to patient  No issues known to pt with past sedation with any surgeries or procedures Pt denies having issues being intubated Pt has no issues moving head neck or swallowing No FH of Malignant Hyperthermia Pt is not on diet pills Pt is not on home 02  Pt is on blood thinners Hold for 5 days per MD Pt denies issues with constipation  Pt has frequent issues with constipation RN instructed pt to use Miralax per bottles instructions Pt is not on dialysis Pt denise any abnormal heart rhythms  Pt denies any upcoming cardiac testing Pt encouraged to use to use Singlecare or Goodrx to reduce cost  Patient's chart reviewed by Cathlyn Parsons CNRA prior to pre-visit and patient appropriate for the [facility].  Pre-visit completed and red dot placed by patient's name on their procedure day (on provider's schedule).  . Visit by phone Pt states weight is 108 lb Instructed pt why it is important to and  to call if they have any changes in health or new medications. Directed them to the # given and on instructions.   Pt states they will.  Instructions reviewed with pt and pt states understanding. Instructed to review again prior to procedure. Pt states they will.  Instructions sent by mail with coupon and by my chart

## 2023-06-20 ENCOUNTER — Encounter: Payer: Self-pay | Admitting: Gastroenterology

## 2023-06-20 ENCOUNTER — Encounter: Payer: Medicaid Other | Admitting: Gastroenterology

## 2023-06-20 ENCOUNTER — Ambulatory Visit: Payer: Medicaid Other | Admitting: Gastroenterology

## 2023-06-20 VITALS — BP 142/76 | HR 89 | Temp 96.9°F | Resp 16 | Ht 62.0 in | Wt 108.0 lb

## 2023-06-20 DIAGNOSIS — Z800 Family history of malignant neoplasm of digestive organs: Secondary | ICD-10-CM | POA: Diagnosis not present

## 2023-06-20 DIAGNOSIS — D123 Benign neoplasm of transverse colon: Secondary | ICD-10-CM | POA: Diagnosis not present

## 2023-06-20 DIAGNOSIS — Z86010 Personal history of colonic polyps: Secondary | ICD-10-CM

## 2023-06-20 DIAGNOSIS — K635 Polyp of colon: Secondary | ICD-10-CM | POA: Diagnosis not present

## 2023-06-20 DIAGNOSIS — Z09 Encounter for follow-up examination after completed treatment for conditions other than malignant neoplasm: Secondary | ICD-10-CM | POA: Diagnosis not present

## 2023-06-20 DIAGNOSIS — Z1211 Encounter for screening for malignant neoplasm of colon: Secondary | ICD-10-CM | POA: Diagnosis not present

## 2023-06-20 HISTORY — PX: COLONOSCOPY W/ POLYPECTOMY: SHX1380

## 2023-06-20 MED ORDER — SODIUM CHLORIDE 0.9 % IV SOLN: 500.0000 mL | Freq: Once | INTRAVENOUS | Status: DC

## 2023-06-20 NOTE — Progress Notes (Signed)
Pt's states no medical or surgical changes since previsit or office visit. 

## 2023-06-20 NOTE — Progress Notes (Signed)
[facility] History and Physical   Primary Care Physician:  Levin Erp, MD   Reason for Procedure:   Colon cancer screening/polyp surveillance  Plan:    Colonoscopy     HPI: Allison Leonard is a 47 y.o. female undergoing surveillance colonoscopy.  She had a colonoscopy in June 2021 in which a 12 mm cecal TA and a 2 mm TA in the descending colon was removed.  She has a family history of colon cancer (sister, 16).   In 2015 she had 5 tubular adenomas removed in [place].  Hyperplastic polyps were removed in 2018.  She takes Plavix, last dose Aug 10   Past Medical History:  Diagnosis Date   Acid reflux 07/04/2018   Anemia    Aneurysm of unspecified site (HCC) 2023   Brain aneurysm 2022   Cannabinoid hyperemesis syndrome 10/27/2018   Celiac disease/sprue    Cyclic vomiting syndrome    Dyspnea    in past per patient 11/24/21   Generalized anxiety disorder 07/04/2018   History of colon polyps    last colonoscopy 2018 removed multiple benign polyps   Migraines 07/04/2018    Past Surgical History:  Procedure Laterality Date   ABDOMINAL HYSTERECTOMY  2006   APPENDECTOMY  1999   COLONOSCOPY  2018   ESOPHAGOGASTRODUODENOSCOPY  2018   IR 3D INDEPENDENT WKST  09/01/2021   IR ANGIO INTRA EXTRACRAN SEL COM CAROTID INNOMINATE BILAT MOD SED  09/01/2021   IR ANGIO INTRA EXTRACRAN SEL INTERNAL CAROTID UNI R MOD SED  11/29/2021   IR ANGIO VERTEBRAL SEL VERTEBRAL BILAT MOD SED  09/01/2021   IR ANGIOGRAM FOLLOW UP STUDY  11/29/2021   IR CT HEAD LTD  11/29/2021   IR RADIOLOGIST EVAL & MGMT  08/21/2021   IR RADIOLOGIST EVAL & MGMT  09/07/2021   IR RADIOLOGIST EVAL & MGMT  12/19/2021   IR TRANSCATH/EMBOLIZ  11/27/2021   RADIOLOGY WITH ANESTHESIA N/A 11/27/2021   Procedure: Otho Bellows;  Surgeon: Julieanne Cotton, MD;  Location: [facility] OR;  Service: Radiology;  Laterality: N/A;    Prior to Admission medications   Medication Sig Start Date End Date Taking?  Authorizing Provider  dicyclomine (BENTYL) 20 MG tablet TAKE 1 TABLET BY MOUTH 3 TIMES A DAY AS NEEDED *NEED OFFICE VISIT* 11/23/22  Yes Tressia Danas, MD  famotidine (PEPCID) 20 MG tablet TAKE 1 TABLET BY MOUTH TWICE A DAY 09/17/22  Yes Tressia Danas, MD  lubiprostone (AMITIZA) 24 MCG capsule Take 1 capsule (24 mcg total) by mouth 2 (two) times daily with a meal. 12/17/22  Yes Zehr, Shanda Bumps D, PA-C  promethazine (PHENERGAN) 25 MG tablet TAKE 1 TABLET BY MOUTH EVERY 6 HOURS AS NEEDED FOR NAUSEA AND VOMITING FOR UP TO 7 DAYS 05/21/23  Yes Levin Erp, MD  Rimegepant Sulfate (NURTEC) 75 MG TBDP Take 75 mg by mouth as needed (Take 1 tab at the earlist onset of Migraine. max 1 tab in 24 hours). 07/06/22  Yes Everlena Cooper, Adam R, DO  clopidogrel (PLAVIX) 75 MG tablet Take 0.5 tablets (37.5 mg total) by mouth daily. 03/29/23   Alene Mires, NP  Erenumab-aooe (AIMOVIG) 140 MG/ML SOAJ INJECT 140MG  DOSE INTO THE SKIN EVERY 28 DAYS 05/22/23   Everlena Cooper, Adam R, DO  nortriptyline (PAMELOR) 50 MG capsule TAKE 1 CAPSULE BY MOUTH AT BEDTIME. Patient not taking: Reported on 03/29/2023 02/14/22   Drema Dallas, DO  Plecanatide (TRULANCE) 3 MG TABS Take 3 mg by mouth daily. Patient not taking: Reported on 03/29/2023  09/08/21   Tressia Danas, MD    Current Outpatient Medications  Medication Sig Dispense Refill   dicyclomine (BENTYL) 20 MG tablet TAKE 1 TABLET BY MOUTH 3 TIMES A DAY AS NEEDED *NEED OFFICE VISIT* 270 tablet 0   famotidine (PEPCID) 20 MG tablet TAKE 1 TABLET BY MOUTH TWICE A DAY 180 tablet 3   lubiprostone (AMITIZA) 24 MCG capsule Take 1 capsule (24 mcg total) by mouth 2 (two) times daily with a meal. 60 capsule 2   promethazine (PHENERGAN) 25 MG tablet TAKE 1 TABLET BY MOUTH EVERY 6 HOURS AS NEEDED FOR NAUSEA AND VOMITING FOR UP TO 7 DAYS 28 tablet 0   Rimegepant Sulfate (NURTEC) 75 MG TBDP Take 75 mg by mouth as needed (Take 1 tab at the earlist onset of Migraine. max 1 tab in 24 hours). 16  tablet 5   clopidogrel (PLAVIX) 75 MG tablet Take 0.5 tablets (37.5 mg total) by mouth daily. 90 tablet 0   Erenumab-aooe (AIMOVIG) 140 MG/ML SOAJ INJECT 140MG  DOSE INTO THE SKIN EVERY 28 DAYS 1 mL 3   nortriptyline (PAMELOR) 50 MG capsule TAKE 1 CAPSULE BY MOUTH AT BEDTIME. (Patient not taking: Reported on 03/29/2023) 90 capsule 2   Plecanatide (TRULANCE) 3 MG TABS Take 3 mg by mouth daily. (Patient not taking: Reported on 03/29/2023) 30 tablet 11   Current Facility-Administered Medications  Medication Dose Route Frequency Provider Last Rate Last Admin   0.9 %  sodium chloride infusion  500 mL Intravenous Once Jenel Lucks, MD        Allergies as of 06/20/2023 - Review Complete 06/20/2023  Allergen Reaction Noted   Zofran Frazier Richards hcl] Anaphylaxis 10/17/2018   Egg-derived products Other (See Comments) 05/30/2023   Morphine and codeine Itching 06/23/2018   Zofran [ondansetron]  09/20/2022    Family History  Problem Relation Age of Onset   Heart disease Father    Diabetes Father    Hypertension Father    Hyperlipidemia Father    Colon polyps Father    Colon cancer Sister    Cervical cancer Maternal Aunt    Stomach cancer Maternal Aunt    Throat cancer Maternal Uncle    Stroke Maternal Uncle    Heart attack Maternal Grandmother    Heart attack Paternal Grandmother    Down syndrome Son    Esophageal cancer Neg Hx    Rectal cancer Neg Hx     Social History   Socioeconomic History   Marital status: Single    Spouse name: Not on file   Number of children: 3   Years of education: Not on file   Highest education level: Not on file  Occupational History   Not on file  Tobacco Use   Smoking status: Former    Current packs/day: 0.00    Average packs/day: 0.5 packs/day for 0.2 years (0.1 ttl pk-yrs)    Types: Cigarettes    Start date: 2024    Quit date: 01/03/2023    Years since quitting: 0.4   Smokeless tobacco: Never  Vaping Use   Vaping status: Never Used   Substance and Sexual Activity   Alcohol use: Not Currently   Drug use: Not Currently    Frequency: 1.0 times per week    Comment: Last use 11/18/21   Sexual activity: Not on file    Comment: Hysterectomy  Other Topics Concern   Not on file  Social History Narrative   Right handed   [employer]  Drinks coffee occasionally   Social Determinants of Corporate investment banker Strain: Not on file  Food Insecurity: Not on file  Transportation Needs: Not on file  Physical Activity: Not on file  Stress: Not on file  Social Connections: Not on file  Intimate Partner Violence: Not on file    Review of Systems:  All other review of systems negative except as mentioned in the HPI.  Physical Exam: Vital signs BP (!) 158/90   Pulse (!) 110   Temp (!) 96.9 F (36.1 C) (Temporal)   Ht 5\' 2"  (1.575 m)   Wt 108 lb (49 kg)   SpO2 99%   BMI 19.75 kg/m   General:   Alert,  Well-developed, well-nourished, pleasant and cooperative in NAD Airway:  Mallampati 2 Lungs:  Clear throughout to auscultation.   Heart:  Regular rate and rhythm; no murmurs, clicks, rubs,  or gallops. Abdomen:  Soft, nontender and nondistended. Normal bowel sounds.   Neuro/Psych:  Normal mood and affect. A and O x 3   Ranie Chinchilla E. Tomasa Rand, MD [facility]

## 2023-06-20 NOTE — Progress Notes (Signed)
Called to room to assist during endoscopic procedure.  Patient ID and intended procedure confirmed with present staff. Received instructions for my participation in the procedure from the performing physician.  

## 2023-06-20 NOTE — Progress Notes (Signed)
Sedate, gd SR, tolerated procedure well, VSS, report to RN 

## 2023-06-20 NOTE — Op Note (Addendum)
[facility] Patient Name: Allison Leonard Procedure Date: 06/20/2023 10:51 AM MRN: [id number] Endoscopist: Lorin Picket E. Tomasa Rand , MD, [id number] Age: 47 Referring MD:  Date of Birth: [birth year] Gender: Female Account #: [id number] Procedure:                Colonoscopy Indications:              High risk colon cancer surveillance: Personal                            history of adenoma (10 mm or greater in size) Medicines:                Monitored Anesthesia Care Procedure:                Pre-Anesthesia Assessment:                           - Prior to the procedure, a History and Physical                            was performed, and patient medications and                            allergies were reviewed. The patient's tolerance of                            previous anesthesia was also reviewed. The risks                            and benefits of the procedure and the sedation                            options and risks were discussed with the patient.                            All questions were answered, and informed consent                            was obtained. Prior Anticoagulants: The patient has                            taken Plavix (clopidogrel), last dose was 5 days                            prior to procedure. ASA Grade Assessment: III - A                            patient with severe systemic disease. After                            reviewing the risks and benefits, the patient was                            deemed in satisfactory condition to undergo the  procedure.                           After obtaining informed consent, the colonoscope                            was passed under direct vision. Throughout the                            procedure, the patient's blood pressure, pulse, and                            oxygen saturations were monitored continuously. The                            CF [id number] #[id number] was  introduced through the anus                            and advanced to the the terminal ileum, with                            identification of the appendiceal orifice and IC                            valve. The colonoscopy was performed without                            difficulty. The patient tolerated the procedure                            well. The quality of the bowel preparation was                            fair. The terminal ileum, ileocecal valve,                            appendiceal orifice, and rectum were photographed.                            The bowel preparation used was SUPREP via split                            dose instruction. Scope In: 11:09:45 AM Scope Out: 11:30:33 AM Scope Withdrawal Time: 0 hours 17 minutes 16 seconds  Total Procedure Duration: 0 hours 20 minutes 48 seconds  Findings:                 The perianal and digital rectal examinations were                            normal. Pertinent negatives include normal                            sphincter tone and no palpable rectal lesions.  Two sessile polyps were found in the transverse                            colon. The polyps were 2 to 3 mm in size. These                            polyps were removed with a cold snare. Resection                            and retrieval were complete. Estimated blood loss                            was minimal.                           A few small-mouthed diverticula were found in the                            descending colon.                           The exam was otherwise normal throughout the                            examined colon.                           The terminal ileum appeared normal.                           The retroflexed view of the distal rectum and anal                            verge was normal and showed no anal or rectal                            abnormalities. Complications:            No immediate  complications. Estimated Blood Loss:     Estimated blood loss was minimal. Impression:               - Preparation of the colon was fair.                           - Two 2 to 3 mm polyps in the transverse colon,                            removed with a cold snare. Resected and retrieved.                           - Mild diverticulosis in the descending colon.                           - The examined portion of the ileum was normal.                           -  The distal rectum and anal verge are normal on                            retroflexion view. Recommendation:           - Patient has a contact number available for                            emergencies. The signs and symptoms of potential                            delayed complications were discussed with the                            patient. Return to normal activities tomorrow.                            Written discharge instructions were provided to the                            patient.                           - Resume previous diet.                           - Continue present medications.                           - Await pathology results.                           - Repeat colonoscopy in 4 years for surveillance                            due to suboptimal bowel prep.                           - Recommend augmented bowel prep with next                            colonoscopy                           - Resume Plavix (clopidogrel) at prior dose                            tomorrow. Lexander Tremblay E. Tomasa Rand, MD 06/20/2023 11:36:22 AM This report has been signed electronically.

## 2023-06-20 NOTE — Patient Instructions (Signed)
Resume your plavix tomorrow.  Resume all of your other medications as ordered.  Read your discharge handouts given to you by your recovery room nurse.   YOU HAD AN ENDOSCOPIC PROCEDURE TODAY AT THE [facility]:   Refer to the procedure report that was given to you for any specific questions about what was found during the examination.  If the procedure report does not answer your questions, please call your gastroenterologist to clarify.  If you requested that your care partner not be given the details of your procedure findings, then the procedure report has been included in a sealed envelope for you to review at your convenience later.  YOU SHOULD EXPECT: Some feelings of bloating in the abdomen. Passage of more gas than usual.  Walking can help get rid of the air that was put into your GI tract during the procedure and reduce the bloating. If you had a lower endoscopy (such as a colonoscopy or flexible sigmoidoscopy) you may notice spotting of blood in your stool or on the toilet paper. If you underwent a bowel prep for your procedure, you may not have a normal bowel movement for a few days.  Please Note:  You might notice some irritation and congestion in your nose or some drainage.  This is from the oxygen used during your procedure.  There is no need for concern and it should clear up in a day or so.  SYMPTOMS TO REPORT IMMEDIATELY:  Following lower endoscopy (colonoscopy or flexible sigmoidoscopy):  Excessive amounts of blood in the stool  Significant tenderness or worsening of abdominal pains  Swelling of the abdomen that is new, acute  Fever of 100F or higher   For urgent or emergent issues, a gastroenterologist can be reached at any hour by calling [phone]. Do not use MyChart messaging for urgent concerns.    DIET:  We do recommend a small meal at first, but then you may proceed to your regular diet.  Drink plenty of fluids but you should avoid alcoholic beverages  for 24 hours.  ACTIVITY:  You should plan to take it easy for the rest of today and you should NOT DRIVE or use heavy machinery until tomorrow (because of the sedation medicines used during the test).    FOLLOW UP: Our staff will call the number listed on your records the next business day following your procedure.  We will call around 7:15- 8:00 am to check on you and address any questions or concerns that you may have regarding the information given to you following your procedure. If we do not reach you, we will leave a message.     If any biopsies were taken you will be contacted by phone or by letter within the next 1-3 weeks.  Please call us at [phone] if you have not heard about the biopsies in 3 weeks.    SIGNATURES/CONFIDENTIALITY: You and/or your care partner have signed paperwork which will be entered into your electronic medical record.  These signatures attest to the fact that that the information above on your After Visit Summary has been reviewed and is understood.  Full responsibility of the confidentiality of this discharge information lies with you and/or your care-partner.

## 2023-06-21 ENCOUNTER — Telehealth: Payer: Self-pay

## 2023-06-21 NOTE — Telephone Encounter (Signed)
Left HIPAA compliant voicemail. 

## 2023-06-25 ENCOUNTER — Encounter: Payer: Self-pay | Admitting: Gastroenterology

## 2023-07-01 ENCOUNTER — Other Ambulatory Visit: Payer: Self-pay | Admitting: Student

## 2023-07-01 DIAGNOSIS — R1115 Cyclical vomiting syndrome unrelated to migraine: Secondary | ICD-10-CM

## 2023-07-05 ENCOUNTER — Ambulatory Visit: Payer: Medicaid Other | Admitting: Student

## 2023-07-25 ENCOUNTER — Other Ambulatory Visit: Payer: Self-pay | Admitting: Neurology

## 2023-07-26 ENCOUNTER — Other Ambulatory Visit (HOSPITAL_COMMUNITY): Payer: Self-pay

## 2023-07-26 ENCOUNTER — Telehealth: Payer: Self-pay | Admitting: Pharmacy Technician

## 2023-07-26 NOTE — Telephone Encounter (Signed)
Pharmacy Patient Advocate Encounter  Received notification from [facility] that Prior Authorization for NURTEC 75MG  has been APPROVED from 9.20.24 to 9.20.25. Ran test claim, Copay is $4. This test claim was processed through [facility]- copay amounts may vary at other pharmacies due to pharmacy/plan contracts, or as the patient moves through the different stages of their insurance plan.   PA #/Case ID/Reference #: [id number]

## 2023-07-26 NOTE — Telephone Encounter (Signed)
PA has been submitted, and telephone encounter has been created.  PA has been approved and can now be filled at pharmacy level.

## 2023-07-26 NOTE — Telephone Encounter (Signed)
Pharmacy Patient Advocate Encounter   Received notification from RX Request Messages that prior authorization for NURTEC 75MG  is required/requested.   Insurance verification completed.   The patient is insured through [facility] .   Per test claim: PA required; PA submitted to [facility] via CoverMyMeds Key/confirmation #/EOC [id number] Status is pending

## 2023-07-27 ENCOUNTER — Other Ambulatory Visit: Payer: Self-pay | Admitting: Student

## 2023-07-27 DIAGNOSIS — R1115 Cyclical vomiting syndrome unrelated to migraine: Secondary | ICD-10-CM

## 2023-07-29 ENCOUNTER — Other Ambulatory Visit: Payer: Self-pay | Admitting: Radiology

## 2023-07-29 MED ORDER — CLOPIDOGREL BISULFATE 75 MG PO TABS: 37.5000 mg | ORAL_TABLET | Freq: Every day | ORAL | 0 refills | Status: DC

## 2023-08-12 ENCOUNTER — Other Ambulatory Visit: Payer: Self-pay | Admitting: *Deleted

## 2023-08-12 DIAGNOSIS — R1013 Epigastric pain: Secondary | ICD-10-CM

## 2023-08-12 MED ORDER — DICYCLOMINE HCL 20 MG PO TABS: 20.0000 mg | ORAL_TABLET | Freq: Three times a day (TID) | ORAL | 0 refills | Status: DC

## 2023-08-12 NOTE — Progress Notes (Unsigned)
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  Continue nortriptyline for now (will discontinue once she starts the Aimovig) *** Continue Nurtec for rescue medication.  Triptans are contraindicated.  Ubrelvy ineffective. *** Limit use of pain relievers to no more than 2 days out of week to prevent risk of rebound or medication-overuse headache. Keep headache diary Follow up 4 months.     Subjective:  Allison Leonard is a 47 year old female with Celiac disease and Cyclic Vomiting Syndrome who follows up for migraines.   UPDATE: Started Aimovig.  *** Intensity:  8-8.5/10 *** Duration:  all day.  Within 30 minutes with Nurtec *** Frequency:  10-15 days a month ***   Repeat MRA head on 01/25/2023 personally reviewed showed flow divertign stent along the supraclinoid segment of the right ICA without residual flow related enhancement of the aneurysm sac.    Repeat echo in February 2024 revealed EF 50-55%.    Frequency:  13 headaches in last 30 days Current NSAIDS:  none Current analgesics:  none Current triptans:  contraindicated - cerebral aneurysms, history of cerebral venous thrombosis Current ergotamine:  none Current anti-emetic:  none Current muscle relaxants:  none Current sleep aide:  none Current Antihypertensive medications:  none Current Antidepressant medications:  nortriptyline 25mg  QHS Current Anticonvulsant medications: none Current anti-CGRP:  none Current Vitamins/Herbal/Supplements:  none Current Antihistamines/Decongestants:  none Other therapy:  none Hormone/birth control:  none   Caffeine:  Coffee 1 to 2 times a month.  No  soda Exercise:  no Depression:  no; Anxiety:  no Other pain:  Sometimes abdominal discomfort Sleep hygiene:  Restless   HISTORY:  Started having headaches at age 33.  Worse over the past 3 to 4 years.  They are mostly severe sharp or pounding headache in her right temple.  Usually lasts a day.  Sometimes they wake her up at night.  17 days over last 30 days.  Nausea, vomiting, photophobia, phonophobia, blurred vision.  No numbness or weakness.  No specific triggers.  Vomiting may help make it better.  MRI brain with and without contrast on 06/03/2020 personally reviewed showed no acute intracranial abnormality but did show chronic nonocclusive thrombus in the superior sagittal sinus; 1-3 mm outpouchings involving the right cavernous ICA, left ICA terminus, proximal basilar artery and right P1 segment; and mild to moderate bilateral A1 segment narrowing.  Follow up CTA of head on 06/20/2020 personally reviewed redemonstrated 1-2 mm distal cavernous/paraclinoid right ICA aneurysm and 2 mm proximal basilar artery aneurysm but previously seen small aneurysms of the right P1 PCA and left ICA terminus not appreciated.  She was referred to interventional radiology at that time but she never received a call.  Hypercoagulable panel including Factor V Leiden gene mutation, from June and August 2021 was negative.  Repeat CTA head performed 08/02/2021 personally reviewed again demonstrated 1-2 mm right paraclinoid ICA aneurysm vs infundibulum and 2 mm proximal basilar artery aneurysm vs fenestration, unchanged from prior study.  She was referred to endovascular interventional radiology.  On 11/27/2021, she underwent embolization with pipeline shield flow diverter device for the right ICA aneurysm.  She was discharged but she had a syncopal episode where she developed a migraine afterwards, prompting her to  go to the ED.  CTA head and neck personally reviewed revealed no postembolization complications.  She was treated with  a headache cocktail.  She had been experiencing sinus tachycardia.  Echo in February 2024 revealed EF 40-45%.  Referred to  cardiology for possible heart failure, who reviewed echo and believed the EF was actually within normal range of 50-55%.      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    Past NSAIDS:  ibuprofen Past analgesics:  Fioricet, Excedrin Past abortive triptans:  sumatriptan 100mg  Past abortive ergotamine:  none Past muscle relaxants:  none Past anti-emetic:  Compazine, Reglan 10mg ; promethazine 12.5mg  Past antihypertensive medications:  none Past antidepressant medications:  none Past anticonvulsant medications:  topiramate Past anti-CGRP:  Bernita Raisin Past vitamins/Herbal/Supplements:  none Past antihistamines/decongestants:  Benadryl Other past therapies:  none   Family history of headache:  Brother, sister, mother, daughter  PAST MEDICAL HISTORY: Past Medical History:  Diagnosis Date   Acid reflux 07/04/2018   Anemia    Aneurysm of unspecified site [facility]) 2023   Brain aneurysm 2022   Cannabinoid hyperemesis syndrome 10/27/2018   Celiac disease/sprue    Cyclic vomiting syndrome    Dyspnea    in past per patient 11/24/21   Generalized anxiety disorder 07/04/2018   History of colon polyps    last colonoscopy 2018 removed multiple benign polyps   Migraines 07/04/2018    MEDICATIONS: Current Outpatient Medications on File Prior to Visit  Medication Sig Dispense Refill   clopidogrel (PLAVIX) 75 MG tablet Take 0.5 tablets (37.5 mg total) by mouth daily. 90 tablet 0   dicyclomine (BENTYL) 20 MG tablet TAKE 1 TABLET BY MOUTH 3 TIMES A DAY AS NEEDED *NEED OFFICE VISIT* 270 tablet 0   Erenumab-aooe (AIMOVIG) 140 MG/ML SOAJ INJECT 140MG  DOSE INTO THE SKIN EVERY 28  DAYS 1 mL 3   famotidine (PEPCID) 20 MG tablet TAKE 1 TABLET BY MOUTH TWICE A DAY 180 tablet 3   lubiprostone (AMITIZA) 24 MCG capsule Take 1 capsule (24 mcg total) by mouth 2 (two) times daily with a meal. 60 capsule 2   nortriptyline (PAMELOR) 50 MG capsule TAKE 1 CAPSULE BY MOUTH AT BEDTIME. (Patient not taking: Reported on 03/29/2023) 90 capsule 2   NURTEC 75 MG TBDP DISSOLVE 1 TABLET BY MOUTH AT EARLIEST ONSET OF MIGRAINE AS NEEDED. MAX 1 TAB PER 24 HOURS 16 tablet 5   Plecanatide (TRULANCE) 3 MG TABS Take 3 mg by mouth daily. (Patient not taking: Reported on 03/29/2023) 30 tablet 11   promethazine (PHENERGAN) 25 MG tablet TAKE 1 TABLET BY MOUTH EVERY 6 HOURS AS NEEDED FOR NAUSEA AND VOMITING FOR UP TO 7 DAYS 28 tablet 0   Current Facility-Administered Medications on File Prior to Visit  Medication Dose Route Frequency Provider Last Rate Last Admin   0.9 %  sodium chloride infusion  500 mL Intravenous Once Jenel Lucks, MD        ALLERGIES: Allergies  Allergen Reactions   Zofran [Ondansetron Hcl] Anaphylaxis   Egg-Derived Products Other (See Comments)    Severe stomach cramps   Morphine And Codeine Itching   Zofran [Ondansetron]     FAMILY HISTORY: Family History  Problem Relation Age of Onset   Heart disease Father    Diabetes Father  Hypertension Father    Hyperlipidemia Father    Colon polyps Father    Colon cancer Sister    Cervical cancer Maternal Aunt    Stomach cancer Maternal Aunt    Throat cancer Maternal Uncle    Stroke Maternal Uncle    Heart attack Maternal Grandmother    Heart attack Paternal Grandmother    Down syndrome Son    Esophageal cancer Neg Hx    Rectal cancer Neg Hx       Objective:  *** General: No acute distress.  Patient appears well-groomed.   Head:  Normocephalic/atraumatic Eyes:  Fundi examined but not visualized Neck: supple, no paraspinal tenderness, full range of motion Heart:  Regular rate and rhythm Neurological Exam:  alert and oriented.  Speech fluent and not dysarthric, language intact.  CN II-XII intact. Bulk and tone normal, muscle strength 5/5 throughout.  Sensation to light touch intact.  Deep tendon reflexes 2+ throughout.  Finger to nose testing intact.  Gait normal, Romberg negative.   Shon Millet, DO  CC: Levin Erp, MD

## 2023-08-12 NOTE — Progress Notes (Signed)
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 I refilled her Dicyclomine under Dr Tomasa Rand.  Last appointment was 06/2023

## 2023-08-13 ENCOUNTER — Ambulatory Visit: Payer: Medicaid Other | Admitting: Neurology

## 2023-08-13 ENCOUNTER — Encounter: Payer: Self-pay | Admitting: Neurology

## 2023-08-13 VITALS — BP 138/80 | HR 104 | Ht 62.0 in | Wt 108.4 lb

## 2023-08-13 DIAGNOSIS — G43009 Migraine without aura, not intractable, without status migrainosus: Secondary | ICD-10-CM | POA: Diagnosis not present

## 2023-08-13 DIAGNOSIS — I671 Cerebral aneurysm, nonruptured: Secondary | ICD-10-CM

## 2023-08-13 MED ORDER — QULIPTA 60 MG PO TABS: 60.0000 mg | ORAL_TABLET | Freq: Every day | ORAL | 11 refills | Status: DC

## 2023-08-13 NOTE — Patient Instructions (Signed)
Plan to change from Aimovig to Qulipta 60mg  daily Nurtec once daily as needed Follow up 5 months.

## 2023-08-19 ENCOUNTER — Other Ambulatory Visit: Payer: Self-pay | Admitting: Student

## 2023-08-19 DIAGNOSIS — R1115 Cyclical vomiting syndrome unrelated to migraine: Secondary | ICD-10-CM

## 2023-09-08 ENCOUNTER — Other Ambulatory Visit: Payer: Self-pay | Admitting: Neurology

## 2023-09-08 ENCOUNTER — Other Ambulatory Visit: Payer: Self-pay | Admitting: Student

## 2023-09-08 DIAGNOSIS — R1115 Cyclical vomiting syndrome unrelated to migraine: Secondary | ICD-10-CM

## 2023-10-01 ENCOUNTER — Other Ambulatory Visit: Payer: Self-pay | Admitting: Neurology

## 2023-10-03 ENCOUNTER — Other Ambulatory Visit: Payer: Self-pay | Admitting: Student

## 2023-10-03 DIAGNOSIS — R1115 Cyclical vomiting syndrome unrelated to migraine: Secondary | ICD-10-CM

## 2023-11-03 ENCOUNTER — Other Ambulatory Visit: Payer: Self-pay | Admitting: Student

## 2023-11-03 DIAGNOSIS — R1115 Cyclical vomiting syndrome unrelated to migraine: Secondary | ICD-10-CM

## 2023-11-05 ENCOUNTER — Telehealth: Payer: Self-pay | Admitting: Pharmacy Technician

## 2023-11-05 ENCOUNTER — Other Ambulatory Visit (HOSPITAL_COMMUNITY): Payer: Self-pay

## 2023-11-05 NOTE — Telephone Encounter (Signed)
 Pharmacy Patient Advocate Encounter   Received notification from CoverMyMeds that prior authorization for Qulipta  60mg  is required/requested.   Insurance verification completed.   The patient is insured through [facility] .   Per test claim: PA required; PA submitted to above mentioned insurance via CoverMyMeds Key/confirmation #/EOC [id number] Status is pending

## 2023-11-11 NOTE — Telephone Encounter (Signed)
 Pharmacy Patient Advocate Encounter  Received notification from [employer] that Prior Authorization for Qulipta  60mg  has been DENIED.  Full denial letter will be uploaded to the media tab. See denial reason below. because we did not see what we need to approve the drug you asked for, (Qulipta  tablet). We may be able to approve this drug when you have tried other drugs first (a trial and failure of two preferred injectable calcitonin gene-related peptide [CGRP] drugs, such as Aimovig , Ajovy  and Emgality). We do not see that you have tried these other drugs first, they did not work for you, or they caused you harm.   PA #/Case ID/Reference #: PA Case ID #: [id number]

## 2023-11-18 ENCOUNTER — Other Ambulatory Visit: Payer: Self-pay

## 2023-11-18 ENCOUNTER — Emergency Department (HOSPITAL_COMMUNITY)
Admission: EM | Admit: 2023-11-18 | Discharge: 2023-11-18 | Disposition: A | Discharge status: Home / Self Care | Payer: Medicaid Other | Attending: Emergency Medicine | Admitting: Emergency Medicine

## 2023-11-18 ENCOUNTER — Encounter (HOSPITAL_COMMUNITY): Payer: Self-pay

## 2023-11-18 DIAGNOSIS — R1084 Generalized abdominal pain: Secondary | ICD-10-CM | POA: Diagnosis not present

## 2023-11-18 DIAGNOSIS — E860 Dehydration: Secondary | ICD-10-CM | POA: Insufficient documentation

## 2023-11-18 DIAGNOSIS — R1115 Cyclical vomiting syndrome unrelated to migraine: Secondary | ICD-10-CM | POA: Diagnosis not present

## 2023-11-18 DIAGNOSIS — R1030 Lower abdominal pain, unspecified: Secondary | ICD-10-CM | POA: Diagnosis not present

## 2023-11-18 DIAGNOSIS — R1010 Upper abdominal pain, unspecified: Secondary | ICD-10-CM | POA: Diagnosis present

## 2023-11-18 DIAGNOSIS — I10 Essential (primary) hypertension: Secondary | ICD-10-CM | POA: Diagnosis not present

## 2023-11-18 DIAGNOSIS — R112 Nausea with vomiting, unspecified: Secondary | ICD-10-CM | POA: Diagnosis not present

## 2023-11-18 LAB — URINALYSIS, ROUTINE W REFLEX MICROSCOPIC
Bilirubin Urine: NEGATIVE
Glucose, UA: NEGATIVE mg/dL
Ketones, ur: 80 mg/dL — AB
Nitrite: NEGATIVE
Protein, ur: 30 mg/dL — AB
Specific Gravity, Urine: 1.016 (ref 1.005–1.030)
pH: 5 (ref 5.0–8.0)

## 2023-11-18 LAB — COMPREHENSIVE METABOLIC PANEL
ALT: 19 U/L (ref 0–44)
AST: 27 U/L (ref 15–41)
Albumin: 5.2 g/dL — ABNORMAL HIGH (ref 3.5–5.0)
Alkaline Phosphatase: 72 U/L (ref 38–126)
Anion gap: 17 — ABNORMAL HIGH (ref 5–15)
BUN: 22 mg/dL — ABNORMAL HIGH (ref 6–20)
CO2: 19 mmol/L — ABNORMAL LOW (ref 22–32)
Calcium: 9.7 mg/dL (ref 8.9–10.3)
Chloride: 94 mmol/L — ABNORMAL LOW (ref 98–111)
Creatinine, Ser: 0.7 mg/dL (ref 0.44–1.00)
GFR, Estimated: >60 mL/min (ref >60)
Glucose, Bld: 98 mg/dL (ref 70–99)
Potassium: 4.1 mmol/L (ref 3.5–5.1)
Sodium: 130 mmol/L — ABNORMAL LOW (ref 135–145)
Total Bilirubin: 0.8 mg/dL (ref 0.0–1.2)
Total Protein: 9.5 g/dL — ABNORMAL HIGH (ref 6.5–8.1)

## 2023-11-18 LAB — CBC
HCT: 45.7 % (ref 36.0–46.0)
Hemoglobin: 14.1 g/dL (ref 12.0–15.0)
MCH: 26.4 pg (ref 26.0–34.0)
MCHC: 30.9 g/dL (ref 30.0–36.0)
MCV: 85.6 fL (ref 80.0–100.0)
Platelets: 243 10*3/uL (ref 150–400)
RBC: 5.34 MIL/uL — ABNORMAL HIGH (ref 3.87–5.11)
RDW: 13.8 % (ref 11.5–15.5)
WBC: 12.9 10*3/uL — ABNORMAL HIGH (ref 4.0–10.5)
nRBC: 0 % (ref 0.0–0.2)

## 2023-11-18 LAB — LIPASE, BLOOD: Lipase: 31 U/L (ref 11–51)

## 2023-11-18 MED ORDER — FAMOTIDINE IN NACL 20-0.9 MG/50ML-% IV SOLN
20.0000 mg | Freq: Once | INTRAVENOUS | Status: AC
  Administered 2023-11-18: 20 mg via INTRAVENOUS
  Filled 2023-11-18: qty 50

## 2023-11-18 MED ORDER — PROMETHAZINE HCL 25 MG RE SUPP: 25.0000 mg | Freq: Four times a day (QID) | RECTAL | 0 refills | Status: DC | PRN

## 2023-11-18 MED ORDER — DROPERIDOL 2.5 MG/ML IJ SOLN
1.2500 mg | Freq: Once | INTRAMUSCULAR | Status: AC
  Administered 2023-11-18: 1.25 mg via INTRAVENOUS
  Filled 2023-11-18: qty 2

## 2023-11-18 MED ORDER — PROMETHAZINE HCL 25 MG PO TABS
25.0000 mg | ORAL_TABLET | Freq: Four times a day (QID) | ORAL | 0 refills | Status: DC | PRN
Start: 2023-11-18 — End: 2024-04-23

## 2023-11-18 MED ORDER — SODIUM CHLORIDE 0.9 % IV BOLUS
1000.0000 mL | Freq: Once | INTRAVENOUS | Status: AC
  Administered 2023-11-18: 1000 mL via INTRAVENOUS

## 2023-11-18 NOTE — ED Notes (Signed)
 Unable to find access for labs.

## 2023-11-18 NOTE — Telephone Encounter (Signed)
 Tried calling patient phone number no in service.

## 2023-11-18 NOTE — ED Triage Notes (Signed)
 Patient BIB EMS for abdominal pain, nausea, and vomiting. Patient reports she was not able to keep her home phenergan down. Hx of cyclic vomiting.

## 2023-11-18 NOTE — Discharge Instructions (Addendum)
 Continue to avoid marijuana.

## 2023-11-18 NOTE — ED Provider Notes (Signed)
 [place] EMERGENCY DEPARTMENT AT [facility] Provider Note   CSN: [id number] Arrival date & time: 11/18/23  1058     History  Chief Complaint  Patient presents with   Allison Leonard    EDLYN Leonard is a 48 y.o. female.  Pt is a 48 yo female with pmhx significant for anxiety, migraines, gerd, cyclic vomiting syndrome and hx brain aneurysm.  Pt said she's not used mj for 3 months, but started vomiting today.  She was unable to keep her home meds down.  She denies any fever.  She has some upper abd Leonard.       Home Medications Prior to Admission medications   Medication Sig Start Date End Date Taking? Authorizing Provider  promethazine  (PHENERGAN ) 25 MG suppository Place 1 suppository (25 mg total) rectally every 6 (six) hours as needed for nausea or vomiting. 11/18/23  Yes Dean Clarity, MD  promethazine  (PHENERGAN ) 25 MG tablet Take 1 tablet (25 mg total) by mouth every 6 (six) hours as needed for nausea or vomiting. 11/18/23  Yes Dean Clarity, MD  Atogepant  (QULIPTA ) 60 MG TABS Take 1 tablet (60 mg total) by mouth daily. 08/13/23   Skeet Juliene SAUNDERS, DO  clopidogrel  (PLAVIX ) 75 MG tablet Take 0.5 tablets (37.5 mg total) by mouth daily. 07/29/23   Lucila Delon BROCKS, NP  dicyclomine  (BENTYL ) 20 MG tablet Take 1 tablet (20 mg total) by mouth 3 (three) times daily before meals. 08/12/23   Stacia Glendia BRAVO, MD  Erenumab -aooe (AIMOVIG ) 140 MG/ML SOAJ INJECT 140MG  DOSE INTO THE SKIN EVERY 28 DAYS 10/02/23   Skeet, Adam R, DO  famotidine  (PEPCID ) 20 MG tablet TAKE 1 TABLET BY MOUTH TWICE A DAY 09/17/22   Eda Iha, MD  lubiprostone  (AMITIZA ) 24 MCG capsule Take 1 capsule (24 mcg total) by mouth 2 (two) times daily with a meal. 12/17/22   Zehr, Jessica D, PA-C  NURTEC 75 MG TBDP DISSOLVE 1 TABLET BY MOUTH AT EARLIEST ONSET OF MIGRAINE AS NEEDED. MAX 1 TAB PER 24 HOURS 07/26/23   Jaffe, Adam R, DO  promethazine  (PHENERGAN ) 25 MG tablet TAKE 1 TABLET BY MOUTH  EVERY 6 HOURS AS NEEDED FOR NAUSEA AND VOMITING FOR UP TO 7 DAYS 11/04/23   Christia Budds, MD      Allergies    Zofran  [ondansetron  hcl], Egg-derived products, Morphine  and codeine, and Zofran  [ondansetron ]    Review of Systems   Review of Systems  Gastrointestinal:  Positive for Allison Leonard, nausea and vomiting.  All other systems reviewed and are negative.   Physical Exam Updated Vital Signs BP (!) 148/81 (BP Location: Left Arm)   Pulse (!) 111   Temp 98.2 F (36.8 C) (Oral)   Resp 16   Ht 5' 2 (1.575 m)   Wt 49.2 kg   SpO2 100%   BMI 19.84 kg/m  Physical Exam Vitals and nursing note reviewed.  Constitutional:      Appearance: She is well-developed.  HENT:     Head: Normocephalic and atraumatic.     Mouth/Throat:     Mouth: Mucous membranes are dry.  Eyes:     Extraocular Movements: Extraocular movements intact.     Pupils: Pupils are equal, round, and reactive to light.  Cardiovascular:     Rate and Rhythm: Regular rhythm. Tachycardia present.     Heart sounds: Normal heart sounds.  Pulmonary:     Effort: Pulmonary effort is normal.     Breath sounds: Normal breath sounds.  Allison:     General: Abdomen is flat. Bowel sounds are normal.     Palpations: Abdomen is soft.     Tenderness: There is Allison tenderness in the epigastric area.  Skin:    General: Skin is warm and dry.     Capillary Refill: Capillary refill takes less than 2 seconds.  Neurological:     General: No focal deficit present.     Mental Status: She is alert and oriented to person, place, and time.  Psychiatric:        Mood and Affect: Mood normal.        Behavior: Behavior normal.     ED Results / Procedures / Treatments   Labs (all labs ordered are listed, but only abnormal results are displayed) Labs Reviewed  COMPREHENSIVE METABOLIC PANEL - Abnormal; Notable for the following components:      Result Value   Sodium 130 (*)    Chloride 94 (*)    CO2 19 (*)    BUN 22  (*)    Total Protein 9.5 (*)    Albumin 5.2 (*)    Anion gap 17 (*)    All other components within normal limits  CBC - Abnormal; Notable for the following components:   WBC 12.9 (*)    RBC 5.34 (*)    All other components within normal limits  URINALYSIS, ROUTINE W REFLEX MICROSCOPIC - Abnormal; Notable for the following components:   APPearance HAZY (*)    Hgb urine dipstick MODERATE (*)    Ketones, ur 80 (*)    Protein, ur 30 (*)    Leukocytes,Ua SMALL (*)    Bacteria, UA RARE (*)    All other components within normal limits  LIPASE, BLOOD    EKG None  Radiology No results found.  Procedures Procedures    Medications Ordered in ED Medications  sodium chloride  0.9 % bolus 1,000 mL (0 mLs Intravenous Stopped 11/18/23 1555)  droperidol  (INAPSINE ) 2.5 MG/ML injection 1.25 mg (1.25 mg Intravenous Given 11/18/23 1312)  famotidine  (PEPCID ) IVPB 20 mg premix (0 mg Intravenous Stopped 11/18/23 1555)    ED Course/ Medical Decision Making/ A&P                                 Medical Decision Making Amount and/or Complexity of Data Reviewed Labs: ordered.  Risk Prescription drug management.   This patient presents to the ED for concern of abd Leonard and n/v, this involves an extensive number of treatment options, and is a complaint that carries with it a high risk of complications and morbidity.  The differential diagnosis includes cyclic vomiting syn, uti, gastritis, pancreatitis, cholecystitis   Co morbidities that complicate the patient evaluation  anxiety, migraines, gerd, cyclic vomiting syndrome and hx brain aneurysm   Additional history obtained:  Additional history obtained from epic chart review  Lab Tests:  I Ordered, and personally interpreted labs.  The pertinent results include:  cbc with sl elevation in wbc at 12.9, cmp nl other than na sl low at 130 and CO2 low at 19; ua + ketones, protein, mod hgb   Medicines ordered and prescription drug  management:  I ordered medication including ivfs/inapsine /pepcid   for sx  Reevaluation of the patient after these medicines showed that the patient improved I have reviewed the patients home medicines and have made adjustments as needed   Test Considered:  ct   Critical Interventions:  ivfs   Problem List / ED Course:  Cyclic vomiting syndrome:  pt feels much better.  She is able to tolerate po fluids.  She is stable for d/c.  Return if worse.  F/u with pcp.   Reevaluation:  After the interventions noted above, I reevaluated the patient and found that they have :improved   Social Determinants of Health:  Lives at home   Dispostion:  After consideration of the diagnostic results and the patients response to treatment, I feel that the patent would benefit from discharge with outpatient f/u.          Final Clinical Impression(s) / ED Diagnoses Final diagnoses:  Generalized Allison Leonard  Dehydration  Cyclic vomiting syndrome    Rx / DC Orders ED Discharge Orders          Ordered    promethazine  (PHENERGAN ) 25 MG tablet  Every 6 hours PRN        11/18/23 1631    promethazine  (PHENERGAN ) 25 MG suppository  Every 6 hours PRN        11/18/23 1631              Quinnie Barcelo, MD 11/18/23 1637

## 2023-11-18 NOTE — Telephone Encounter (Signed)
 ERROR

## 2023-11-29 ENCOUNTER — Other Ambulatory Visit: Payer: Self-pay | Admitting: Gastroenterology

## 2023-11-29 DIAGNOSIS — R1013 Epigastric pain: Secondary | ICD-10-CM

## 2023-12-10 ENCOUNTER — Other Ambulatory Visit: Payer: Self-pay | Admitting: Student

## 2023-12-10 DIAGNOSIS — R1115 Cyclical vomiting syndrome unrelated to migraine: Secondary | ICD-10-CM

## 2023-12-31 ENCOUNTER — Other Ambulatory Visit: Payer: Self-pay | Admitting: Student

## 2023-12-31 DIAGNOSIS — R1115 Cyclical vomiting syndrome unrelated to migraine: Secondary | ICD-10-CM

## 2024-01-22 ENCOUNTER — Other Ambulatory Visit: Payer: Self-pay | Admitting: Student

## 2024-01-22 DIAGNOSIS — R1115 Cyclical vomiting syndrome unrelated to migraine: Secondary | ICD-10-CM

## 2024-01-24 MED ORDER — PROMETHAZINE HCL 25 MG PO TABS
25.0000 mg | ORAL_TABLET | Freq: Four times a day (QID) | ORAL | 0 refills | Status: DC | PRN
Start: 2024-01-24 — End: 2024-02-16

## 2024-01-28 ENCOUNTER — Ambulatory Visit: Payer: Medicaid Other | Admitting: Neurology

## 2024-01-31 ENCOUNTER — Telehealth: Payer: Self-pay | Admitting: Gastroenterology

## 2024-01-31 NOTE — Telephone Encounter (Signed)
 PT is calling to let us know that the nausea medicine we prescribed is no longer working and would like some options. Please advise.

## 2024-01-31 NOTE — Telephone Encounter (Signed)
 Pt states that her antinausea medication is not helping and her bentyl is not working. Pt requesting to be seen. Pt scheduled to see Alcide Evener NP 02/10/24 @1 :30pm. Pt aware of appt.

## 2024-02-03 ENCOUNTER — Encounter: Payer: Self-pay | Admitting: Neurology

## 2024-02-10 ENCOUNTER — Ambulatory Visit: Admitting: Nurse Practitioner

## 2024-02-10 NOTE — Progress Notes (Deleted)
     02/10/2024 Allison Leonard [id number] 06/18/1976   Chief Complaint:  History of Present Illness: Allison Leonard is a 48 year old female with a past medical history of anxiety, migraine headaches, GERD, cyclic vomiting with cannabinoid hyperemesis syndrome component  and colon polyps. Past hysterectomy and appendectomy. Previously followed by Dr. Orvan Falconer.   Sister diagnosed with stage IV colon cancer at the age of 1.  PAST GI PROCEDURES:   Current Medications, Allergies, Past Medical History, Past Surgical History, Family History and Social History were reviewed in [employer] record.   Review of Systems:   Constitutional: Negative for fever, sweats, chills or weight loss.  Respiratory: Negative for shortness of breath.   Cardiovascular: Negative for chest pain, palpitations and leg swelling.  Gastrointestinal: See HPI.  Musculoskeletal: Negative for back pain or muscle aches.  Neurological: Negative for dizziness, headaches or paresthesias.    Physical Exam: There were no vitals taken for this visit. General: in no acute distress. Head: Normocephalic and atraumatic. Eyes: No scleral icterus. Conjunctiva pink . Ears: Normal auditory acuity. Mouth: Dentition intact. No ulcers or lesions.  Lungs: Clear throughout to auscultation. Heart: Regular rate and rhythm, no murmur. Abdomen: Soft, nontender and nondistended. No masses or hepatomegaly. Normal bowel sounds x 4 quadrants.  Rectal: Deferred.  Musculoskeletal: Symmetrical with no gross deformities. Extremities: No edema. Neurological: Alert oriented x 4. No focal deficits.  Psychological: Alert and cooperative. Normal mood and affect  Assessment and Recommendations: ***

## 2024-02-16 ENCOUNTER — Encounter: Payer: Self-pay | Admitting: Gastroenterology

## 2024-02-16 ENCOUNTER — Other Ambulatory Visit: Payer: Self-pay | Admitting: Student

## 2024-02-16 DIAGNOSIS — R1115 Cyclical vomiting syndrome unrelated to migraine: Secondary | ICD-10-CM

## 2024-02-17 ENCOUNTER — Encounter: Payer: Self-pay | Admitting: Student

## 2024-02-18 ENCOUNTER — Ambulatory Visit: Admitting: Student

## 2024-02-18 MED ORDER — PROMETHAZINE HCL 25 MG PO TABS: 25.0000 mg | ORAL_TABLET | Freq: Four times a day (QID) | ORAL | 0 refills | Status: DC | PRN

## 2024-03-06 ENCOUNTER — Other Ambulatory Visit: Payer: Self-pay

## 2024-03-06 ENCOUNTER — Other Ambulatory Visit: Payer: Self-pay | Admitting: Student

## 2024-03-06 ENCOUNTER — Encounter: Payer: Self-pay | Admitting: Neurology

## 2024-03-06 DIAGNOSIS — R1115 Cyclical vomiting syndrome unrelated to migraine: Secondary | ICD-10-CM

## 2024-03-06 DIAGNOSIS — R1013 Epigastric pain: Secondary | ICD-10-CM

## 2024-03-06 DIAGNOSIS — K5909 Other constipation: Secondary | ICD-10-CM

## 2024-03-06 MED ORDER — FAMOTIDINE 20 MG PO TABS: 20.0000 mg | ORAL_TABLET | Freq: Two times a day (BID) | ORAL | 0 refills | Status: DC

## 2024-03-06 MED ORDER — PROMETHAZINE HCL 25 MG PO TABS: 25.0000 mg | ORAL_TABLET | Freq: Four times a day (QID) | ORAL | 0 refills | Status: DC | PRN

## 2024-03-08 ENCOUNTER — Other Ambulatory Visit: Payer: Self-pay | Admitting: Neurology

## 2024-03-08 MED ORDER — TIZANIDINE HCL 4 MG PO TABS: 4.0000 mg | ORAL_TABLET | Freq: Three times a day (TID) | ORAL | 0 refills | Status: AC | PRN

## 2024-03-09 ENCOUNTER — Ambulatory Visit (HOSPITAL_COMMUNITY): Payer: Medicaid Other | Attending: Cardiovascular Disease

## 2024-03-09 ENCOUNTER — Encounter: Payer: Self-pay | Admitting: Cardiovascular Disease

## 2024-03-09 DIAGNOSIS — I050 Rheumatic mitral stenosis: Secondary | ICD-10-CM | POA: Insufficient documentation

## 2024-03-09 LAB — ECHOCARDIOGRAM COMPLETE
Area-P 1/2: 11.16 cm2
MV M vel: 4.54 m/s
MV Peak grad: 82.4 mmHg
MV VTI: 1.08 cm2
P 1/2 time: 412 ms
S' Lateral: 5 cm

## 2024-03-12 ENCOUNTER — Encounter: Payer: Self-pay | Admitting: Cardiovascular Disease

## 2024-03-25 ENCOUNTER — Ambulatory Visit

## 2024-03-31 ENCOUNTER — Other Ambulatory Visit: Payer: Self-pay | Admitting: Student

## 2024-03-31 DIAGNOSIS — R1115 Cyclical vomiting syndrome unrelated to migraine: Secondary | ICD-10-CM

## 2024-04-07 ENCOUNTER — Ambulatory Visit: Payer: Self-pay | Attending: Nurse Practitioner | Admitting: Nurse Practitioner

## 2024-04-07 ENCOUNTER — Encounter: Payer: Self-pay | Admitting: Nurse Practitioner

## 2024-04-07 VITALS — BP 117/74 | HR 98 | Ht 62.0 in | Wt 111.6 lb

## 2024-04-07 DIAGNOSIS — I10 Essential (primary) hypertension: Secondary | ICD-10-CM | POA: Diagnosis not present

## 2024-04-07 DIAGNOSIS — I051 Rheumatic mitral insufficiency: Secondary | ICD-10-CM | POA: Diagnosis not present

## 2024-04-07 DIAGNOSIS — I5022 Chronic systolic (congestive) heart failure: Secondary | ICD-10-CM | POA: Diagnosis not present

## 2024-04-07 DIAGNOSIS — I061 Rheumatic aortic insufficiency: Secondary | ICD-10-CM | POA: Diagnosis not present

## 2024-04-07 DIAGNOSIS — I050 Rheumatic mitral stenosis: Secondary | ICD-10-CM | POA: Diagnosis not present

## 2024-04-07 DIAGNOSIS — I671 Cerebral aneurysm, nonruptured: Secondary | ICD-10-CM | POA: Diagnosis not present

## 2024-04-07 MED ORDER — METOPROLOL SUCCINATE ER 25 MG PO TB24: 25.0000 mg | ORAL_TABLET | Freq: Every day | ORAL | 3 refills | Status: AC

## 2024-04-07 MED ORDER — LOSARTAN POTASSIUM 25 MG PO TABS: 25.0000 mg | ORAL_TABLET | Freq: Every day | ORAL | 3 refills | Status: DC

## 2024-04-07 NOTE — H&P (View-Only) (Signed)
 Office Visit    Patient Name: Allison Leonard Date of Encounter: 04/07/2024  Primary Care Provider:  Genora Kidd, MD Primary Cardiologist:  Oneil Bigness, MD  Chief Complaint    48 year old female with a history of chronic systolic heart failure, rheumatic valvular heart disease (aortic valve regurgitation, mitral valve stenosis, mitral valve regurgitation), right ICA aneurysm s/p embolization, hypertension, cyclic vomiting syndrome, and migraines who presents for follow-up related to valvular heart disease.  Past Medical History    Past Medical History:  Diagnosis Date   Acid reflux 07/04/2018   Anemia    Aneurysm of unspecified site [facility]) 2023   Brain aneurysm 2022   Cannabinoid hyperemesis syndrome 10/27/2018   Celiac disease/sprue    Cyclic vomiting syndrome    Dyspnea    in past per patient 11/24/21   Generalized anxiety disorder 07/04/2018   History of colon polyps    last colonoscopy 2018 removed multiple benign polyps   Migraines 07/04/2018   Past Surgical History:  Procedure Laterality Date   ABDOMINAL HYSTERECTOMY  2006   APPENDECTOMY  1999   COLONOSCOPY  2018   COLONOSCOPY W/ POLYPECTOMY  04/2020   K Beavers TA, 3 yr recall   COLONOSCOPY W/ POLYPECTOMY  06/20/2023   [facility]ol [facility] at [facility]   ESOPHAGOGASTRODUODENOSCOPY  2018   IR 3D INDEPENDENT WKST  09/01/2021   IR ANGIO INTRA EXTRACRAN SEL COM CAROTID INNOMINATE BILAT MOD SED  09/01/2021   IR ANGIO INTRA EXTRACRAN SEL INTERNAL CAROTID UNI R MOD SED  11/29/2021   IR ANGIO VERTEBRAL SEL VERTEBRAL BILAT MOD SED  09/01/2021   IR ANGIOGRAM FOLLOW UP STUDY  11/29/2021   IR CT HEAD LTD  11/29/2021   IR RADIOLOGIST EVAL & MGMT  08/21/2021   IR RADIOLOGIST EVAL & MGMT  09/07/2021   IR RADIOLOGIST EVAL & MGMT  12/19/2021   IR TRANSCATH/EMBOLIZ  11/27/2021   RADIOLOGY WITH ANESTHESIA N/A 11/27/2021   Procedure: Alfrieda Antes;  Surgeon: Luellen Sages, MD;  Location: [facility] OR;  Service: Radiology;   Laterality: N/A;    Allergies  Allergies  Allergen Reactions   Zofran  [Ondansetron  Hcl] Anaphylaxis   Egg-Derived Products Other (See Comments)    Severe stomach cramps   Morphine  And Codeine Itching   Zofran  [Ondansetron ]      Labs/Other Studies Reviewed    The following studies were reviewed today:  Cardiac Studies & Procedures   ______________________________________________________________________________________________     ECHOCARDIOGRAM  ECHOCARDIOGRAM COMPLETE 03/09/2024  Narrative ECHOCARDIOGRAM REPORT    Patient Name:   Allison Leonard Date of Exam: 03/09/2024 Medical Rec #:  [id number]               Height:       62.0 in Accession #:    [id number]              Weight:       108.5 lb Date of Birth:  03-15-1976               BSA:          1.474 m Patient Age:    47 years                BP:           124/86 mmHg Patient Gender: F                       HR:  100 bpm. Exam Location:  [address]  Procedure: 2D Echo, 3D Echo, Cardiac Doppler and Color Doppler (Both Spectral and Color Flow Doppler were utilized during procedure).  Indications:    I05.0 Mitral stenosis  History:        Patient has prior history of Echocardiogram examinations, most recent 12/07/2022. Signs/Symptoms:Dyspnea; Risk Factors:Hypertension.  Sonographer:    Juventino Oppenheim RCS Referring Phys: [id number] Cathay Clonts O'NEAL  IMPRESSIONS   1. Compared with the echo 12/2022, systolic function has decreased from 50-55% to 25-30%. Left ventricular ejection fraction, by estimation, is 25 to 30%. The left ventricle has severely decreased function. The left ventricle demonstrates global hypokinesis. Left ventricular diastolic parameters are consistent with Grade I diastolic dysfunction (impaired relaxation). Elevated left ventricular end-diastolic pressure. 2. Right ventricular systolic function is normal. The right ventricular size is normal. There is normal pulmonary artery  systolic pressure. 3. 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 No evidence of mitral stenosis. 4. The aortic valve is tricuspid. Aortic valve regurgitation is moderate. No aortic stenosis is present. Aortic regurgitation PHT measures 412 msec. 5. The inferior vena cava is normal in size with greater than 50% respiratory variability, suggesting right atrial pressure of 3 mmHg.  FINDINGS Left Ventricle: Compared with the echo 12/2022, systolic function has decreased from 50-55% to 25-30%. Left ventricular ejection fraction, by estimation, is 25 to 30%. The left ventricle has severely decreased function. The left ventricle demonstrates global hypokinesis. The left ventricular internal cavity size was normal in size. There is no left ventricular hypertrophy. Left ventricular diastolic parameters are consistent with Grade I diastolic dysfunction (impaired relaxation). Elevated left ventricular end-diastolic pressure.   LV Wall Scoring: There is diffuse hypokinesis.  Right Ventricle: The right ventricular size is normal. No increase in right ventricular wall thickness. Right ventricular systolic function is normal. There is normal pulmonary artery systolic pressure. The tricuspid regurgitant velocity is 1.44 m/s, and with an assumed right atrial pressure of 3 mmHg, the estimated right ventricular systolic pressure is 11.3 mmHg.  Left Atrium: Left atrial size was normal in size.  Right Atrium: Right atrial size was normal in size.  Pericardium: Trivial pericardial effusion is present.  Mitral Valve: The mitral valve is rheumatic. There is mild thickening of the mitral valve leaflet(s). Mild mitral valve regurgitation. No evidence of mitral valve stenosis. MV peak gradient, 13.2 mmHg. The mean mitral valve gradient is 4.7 mmHg.  Tricuspid Valve: The tricuspid valve is normal in structure. Tricuspid valve regurgitation is trivial. No evidence of tricuspid stenosis.  Aortic  Valve: The aortic valve is tricuspid. Aortic valve regurgitation is moderate. Aortic regurgitation PHT measures 412 msec. No aortic stenosis is present.  Pulmonic Valve: The pulmonic valve was normal in structure. Pulmonic valve regurgitation is not visualized. No evidence of pulmonic stenosis.  Aorta: The aortic root is normal in size and structure.  Venous: The inferior vena cava is normal in size with greater than 50% respiratory variability, suggesting right atrial pressure of 3 mmHg.  IAS/Shunts: No atrial level shunt detected by color flow Doppler.  Additional Comments: 3D was performed not requiring image post processing on an independent workstation and was normal.   LEFT VENTRICLE PLAX 2D LVIDd:         5.70 cm   Diastology LVIDs:         5.00 cm   LV e' medial:    6.31 cm/s LV PW:         0.90 cm  LV E/e' medial:  15.7 LV IVS:        0.90 cm   LV e' lateral:   9.79 cm/s LVOT diam:     2.00 cm   LV E/e' lateral: 10.1 LV SV:         38 LV SV Index:   26 LVOT Area:     3.14 cm  3D Volume EF: 3D EF:        27 % LV EDV:       167 ml LV ESV:       121 ml LV SV:        45 ml  RIGHT VENTRICLE RV Basal diam:  1.90 cm RV S prime:     7.40 cm/s TAPSE (M-mode): 1.8 cm RVSP:           11.3 mmHg  LEFT ATRIUM             Index        RIGHT ATRIUM           Index LA diam:        2.40 cm 1.63 cm/m   RA Pressure: 3.00 mmHg LA Vol (A2C):   45.8 ml 31.07 ml/m  RA Area:     7.44 cm LA Vol (A4C):   23.5 ml 15.94 ml/m  RA Volume:   11.90 ml  8.07 ml/m LA Biplane Vol: 33.1 ml 22.46 ml/m AORTIC VALVE LVOT Vmax:   75.40 cm/s LVOT Vmean:  45.550 cm/s LVOT VTI:    0.122 m AI PHT:      412 msec  AORTA Ao Root diam: 2.70 cm Ao Asc diam:  2.50 cm  MITRAL VALVE                TRICUSPID VALVE MV Area (PHT): 11.16 cm    TR Peak grad:   8.3 mmHg MV Area VTI:   1.08 cm     TR Vmax:        144.00 cm/s MV Peak grad:  13.2 mmHg    Estimated RAP:  3.00 mmHg MV Mean grad:  4.7 mmHg      RVSP:           11.3 mmHg MV Vmax:       1.82 m/s MV Vmean:      95.5 cm/s    SHUNTS MV Decel Time: 68 msec      Systemic VTI:  0.12 m MR Peak grad: 82.4 mmHg     Systemic Diam: 2.00 cm MR Mean grad: 53.0 mmHg MR Vmax:      454.00 cm/s MR Vmean:     345.0 cm/s MV E velocity: 99.00 cm/s MV A velocity: 172.00 cm/s MV E/A ratio:  0.58  Maudine Sos MD Electronically signed by Maudine Sos MD Signature Date/Time: 03/09/2024/3:29:56 PM    Final      CT SCANS  CT CORONARY MORPH W/CTA COR W/SCORE 12/27/2022  Addendum 12/30/2022 11:12 AM ADDENDUM REPORT: 12/30/2022 11:09  EXAM: OVER-READ INTERPRETATION  CT CHEST  The following report is an over-read performed by radiologist Dr. Mel Spine [facility] on 12/30/2022. This over-read does not include interpretation of cardiac or coronary anatomy or pathology. The coronary CTA interpretation by the cardiologist is attached.  COMPARISON:  Chest CTA 01/04/2022  FINDINGS: Vascular: No aortic atherosclerosis. The included aorta is normal in caliber. No pulmonary embolus where included on this exam not tailored to pulmonary artery assessment.  Mediastinum/nodes: No adenopathy or mass. Unremarkable esophagus.  Lungs: Subsegmental linear atelectasis or scarring within the right middle lobe and lingula. No pulmonary nodule. No pleural fluid. The included airways are patent.  Upper abdomen: No acute or unexpected findings. Incidental punctate granuloma in the right lobe of the liver.  Musculoskeletal: There are no acute or suspicious osseous abnormalities. Mild pectus excavatum deformity.  IMPRESSION: 1. Minor subsegmental atelectasis or scarring in the right middle lobe and lingula. 2. Mild pectus excavatum deformity.   Electronically Signed By: Chadwick Colonel M.D. On: 12/30/2022 11:09  Narrative CLINICAL DATA:  Chest pain  EXAM: Cardiac/Coronary CTA  TECHNIQUE: A non-contrast, gated CT  scan was obtained with axial slices of 3 mm through the heart for calcium scoring. Calcium scoring was performed using the Agatston method. A 70 kV prospective, gated, contrast cardiac scan was obtained. Gantry rotation speed was 250 msecs and collimation was 0.6 mm. Two sublingual nitroglycerin  tablets (0.8 mg) were given. The 3D data set was reconstructed in 5% intervals of the 35-75% of the R-R cycle. Diastolic phases were analyzed on a dedicated workstation using MPR, MIP, and VRT modes. The patient received 95 cc of contrast.  FINDINGS: Image quality: Excellent.  Noise artifact is: Limited.  Coronary Arteries:  Normal coronary origin.  Right dominance.  Left main: The left main is a large caliber vessel with a normal take off from the left coronary cusp that bifurcates to form a left anterior descending artery and a left circumflex artery. trifurcates into a LAD, LCX, and ramus intermedius. There is no plaque or stenosis.  Left anterior descending artery: The LAD is patent without evidence of plaque or stenosis. The LAD gives off 1 patent diagonal branch.  Left circumflex artery: The LCX is non-dominant and patent with no evidence of plaque or stenosis. The LCX gives off 1 patent obtuse marginal branch.  Right coronary artery: The RCA is dominant with normal take off from the right coronary cusp. There is no evidence of plaque or stenosis. The RCA terminates as a PDA without evidence of plaque or stenosis.  Right Atrium: Right atrial size is within normal limits.  Right Ventricle: The right ventricular cavity is within normal limits.  Left Atrium: Left atrial size is normal in size with no left atrial appendage filling defect.  Left Ventricle: The ventricular cavity size is within normal limits. Small apical diverticulum.  Pulmonary arteries: Normal in size without proximal filling defect.  Pulmonary veins: Normal pulmonary venous drainage.  Pericardium: Normal  thickness without significant effusion or calcium present.  Cardiac valves: The aortic valve is trileaflet without significant calcification. The mitral valve is normal without significant calcification.  Aorta: Normal caliber without significant disease.  Extra-cardiac findings: See attached radiology report for non-cardiac structures.  IMPRESSION: 1. Coronary calcium score of 0.  2. Normal coronary origin with right dominance.  3. Normal coronary arteries.  RECOMMENDATIONS: 1. No evidence of CAD (0%). Consider non-atherosclerotic causes of chest pain.  Jackquelyn Mass, MD  Electronically Signed: By: Jackquelyn Mass M.D. On: 12/27/2022 21:55     ______________________________________________________________________________________________     Recent Labs: 11/18/2023: ALT 19; BUN 22; Creatinine, Ser 0.70; Hemoglobin 14.1; Platelets 243; Potassium 4.1; Sodium 130  Recent Lipid Panel No results found for: "CHOL", "TRIG", "HDL", "CHOLHDL", "VLDL", "LDLCALC", "LDLDIRECT"  History of Present Illness    48 year old female with the above past medical history including chronic systolic heart failure, rheumatic valvular heart disease (aortic valve regurgitation, mitral valve stenosis, mitral valve regurgitation), right ICA aneurysm s/p embolization, hypertension, cyclic vomiting  syndrome, and migraines.  She has a history of rheumatic fever with subsequent valvular heart disease.  Echocardiogram in 01/2022 showed EF 40 to 45%.  Echocardiogram in 2/24 showed EF improved to 50 to 55%.  She has a history of R ICA aneurysm s/p embolization in 11/2021. Coronary CTA in 2/24 revealed coronary calcium score of 0, normal coronary arteries.  She was last seen in the office on 03/29/2023 and was stable from a cardiac standpoint.  Repeat echocardiogram in 03/2024 showed EF 25 to 30%, severely decreased LV function, LV global hypokinesis, G1 DD, elevated LVEDP, normal RV systolic function, mild rheumatic  mitral valve regurgitation, moderate aortic valve regurgitation (per Dr. Edsel Grace review, echo showed moderate to severe aortic regurgitation, rheumatic mitral valve stenosis, MR).  It was noted that her valvular heart disease was likely contributing to her decreased EF.  She was not considered a candidate for cardiac MRI in the setting of internal carotid artery embolization. TEE was recommended with escalation of GDMT.  She presents today for follow-up.  Since her last visit she has been stable overall from a cardiac standpoint.  She notes intermittent fleeting chest discomfort, denies any exertional symptoms concerning for angina.  She does have some mild shortness of breath with exertion, she denies any palpitations, dizziness, edema, PND, orthopnea, weight gain.  She reports a significant amount of personal stress.  She cares for her adult son who has Down syndrome.  She is eager to discuss her valvular heart disease and plans for management.  Home Medications    Current Outpatient Medications  Medication Sig Dispense Refill   clopidogrel  (PLAVIX ) 75 MG tablet Take 0.5 tablets (37.5 mg total) by mouth daily. 90 tablet 0   dicyclomine  (BENTYL ) 20 MG tablet TAKE 1 TABLET (20 MG TOTAL) BY MOUTH 3 (THREE) TIMES DAILY BEFORE MEALS. 90 tablet 2   famotidine  (PEPCID ) 20 MG tablet Take 1 tablet (20 mg total) by mouth 2 (two) times daily. 180 tablet 0   losartan (COZAAR) 25 MG tablet Take 1 tablet (25 mg total) by mouth daily. 90 tablet 3   lubiprostone  (AMITIZA ) 24 MCG capsule Take 1 capsule (24 mcg total) by mouth 2 (two) times daily with a meal. 60 capsule 2   metoprolol  succinate (TOPROL  XL) 25 MG 24 hr tablet Take 1 tablet (25 mg total) by mouth daily. 90 tablet 3   NURTEC 75 MG TBDP DISSOLVE 1 TABLET BY MOUTH AT EARLIEST ONSET OF MIGRAINE AS NEEDED. MAX 1 TAB PER 24 HOURS 16 tablet 5   promethazine  (PHENERGAN ) 25 MG tablet Take 1 tablet (25 mg total) by mouth every 6 (six) hours as needed for nausea  or vomiting. 10 tablet 0   promethazine  (PHENERGAN ) 25 MG tablet TAKE 1 TABLET BY MOUTH EVERY 6 HOURS AS NEEDED FOR NAUSEA OR VOMITING. 28 tablet 0   tiZANidine  (ZANAFLEX ) 4 MG tablet Take 1 tablet (4 mg total) by mouth 3 (three) times daily as needed for muscle spasms. 60 tablet 0   Current Facility-Administered Medications  Medication Dose Route Frequency Provider Last Rate Last Admin   0.9 %  sodium chloride  infusion  500 mL Intravenous Once Elois Hair, MD         Review of Systems    She denies chest pain, palpitations, pnd, orthopnea, n, v, dizziness, syncope, edema, weight gain, or early satiety. All other systems reviewed and are otherwise negative except as noted above.   Physical Exam    VS:  BP 117/74 (BP  Location: Right Arm)   Pulse 98   Ht 5\' 2"  (1.575 m)   Wt 111 lb 9.6 oz (50.6 kg)   SpO2 96%   BMI 20.41 kg/m  GEN: Well nourished, well developed, in no acute distress. HEENT: normal. Neck: Supple, no JVD, carotid bruits, or masses. Cardiac: RRR, 2/6 murmur, no rubs, or gallops. No clubbing, cyanosis, edema.  Radials/DP/PT 2+ and equal bilaterally.  Respiratory:  Respirations regular and unlabored, clear to auscultation bilaterally. GI: Soft, nontender, nondistended, BS + x 4. MS: no deformity or atrophy. Skin: warm and dry, no rash. Neuro:  Strength and sensation are intact. Psych: Normal affect.  Accessory Clinical Findings    ECG personally reviewed by me today - EKG Interpretation Date/Time:  Tuesday April 07 2024 09:58:09 EDT Ventricular Rate:  98 PR Interval:  176 QRS Duration:  84 QT Interval:  390 QTC Calculation: 497 R Axis:   104  Text Interpretation: Sinus rhythm with occasional Premature ventricular complexes Left atrial enlargement Rightward axis Biventricular hypertrophy Prolonged QT When compared with ECG of 04-Jan-2022 21:17, PREVIOUS ECG IS PRESENT Confirmed by Marlana Silvan ([id number]) on 04/07/2024 10:01:50 AM  - no acute changes.   Lab  Results  Component Value Date   WBC 12.9 (H) 11/18/2023   HGB 14.1 11/18/2023   HCT 45.7 11/18/2023   MCV 85.6 11/18/2023   PLT 243 11/18/2023   Lab Results  Component Value Date   CREATININE 0.70 11/18/2023   BUN 22 (H) 11/18/2023   NA 130 (L) 11/18/2023   K 4.1 11/18/2023   CL 94 (L) 11/18/2023   CO2 19 (L) 11/18/2023   Lab Results  Component Value Date   ALT 19 11/18/2023   AST 27 11/18/2023   ALKPHOS 72 11/18/2023   BILITOT 0.8 11/18/2023   No results found for: "CHOL", "HDL", "LDLCALC", "LDLDIRECT", "TRIG", "CHOLHDL"  No results found for: "HGBA1C"  Assessment & Plan    1. Chronic systolic heart failure: Most recent echo in 03/2024 showed EF 25 to 30%, severely decreased LV function, LV global hypokinesis, G1 DD, elevated LVEDP, normal RV systolic function, mild rheumatic mitral valve regurgitation, moderate aortic valve regurgitation (per Dr. Edsel Grace review, echo showed moderate to severe aortic regurgitation, rheumatic mitral valve stenosis, MR).  It was noted that her valvular heart disease was likely contributing to her decreased EF. Not a candidate for cardiac MRI in the setting of internal carotid artery embolization. She does report mild shortness of breath with activity, generally euvolemic and well compensated on exam.  GDMT somewhat limited in the setting of borderline BP.  Will trial metoprolol  succinate 25 mg daily.  Will also trial losartan 25 mg daily.  I do not think BP will tolerate Entresto at this time. Continue to monitor BP and report SBP consistently less than 100 mmHg, HR consistently less than 55 bpm.  Will update BMET, CBC with repeat BMET in 2 weeks in the setting of new losartan. Consider escalation of GDMT as able.    2. Valvular heart disease: Most recent echocardiogram concerning for moderate to severe aortic valve regurgitation, rheumatic mitral valve stenosis, MR.  She reports shortness of breath with activity, euvolemic and well compensated on exam.   Follow-up TEE was recommended.  Through shared decision making, will pursue TEE.  Will update CBC, BMET prior to procedure.  Will initiate GDMT as above.  Further recommendations pending TEE.  Informed Consent   Shared Decision Making/Informed Consent   The risks [esophageal damage, perforation (1:10,000 risk),  bleeding, pharyngeal hematoma as well as other potential complications associated with conscious sedation including aspiration, arrhythmia, respiratory failure and death], benefits (treatment guidance and diagnostic support) and alternatives of a transesophageal echocardiogram were discussed in detail with Ms. Lehrman and she is willing to proceed.  TEE scheduled for 04/28/2024 at 8 AM with Dr. Chancy Comber.      3. R ICA aneurysm:  S/p embolization in 11/2021.  Stable.  Continue Plavix .  4. Hypertension: BP well controlled.  Continue to monitor BP as above with initiation of GDMT.    5. Cyclic vomiting syndrome: She takes daily Phenergan .  QT prolongation noted  on EKG.  However, she states Phenergan  is the only medication that controls her symptoms.  Continue to monitor. Following with GI.    6. Disposition: Follow-up in 1 month with APP, follow-up as scheduled with Dr. Rolm Clos in 06/2024.       Jude Norton, NP 04/07/2024, 10:45 AM

## 2024-04-07 NOTE — Progress Notes (Signed)
 Office Visit    Patient Name: Allison Leonard Date of Encounter: 04/07/2024  Primary Care Provider:  Genora Kidd, MD Primary Cardiologist:  Oneil Bigness, MD  Chief Complaint    48 year old female with a history of chronic systolic heart failure, rheumatic valvular heart disease (aortic valve regurgitation, mitral valve stenosis, mitral valve regurgitation), right ICA aneurysm s/p embolization, hypertension, cyclic vomiting syndrome, and migraines who presents for follow-up related to valvular heart disease.  Past Medical History    Past Medical History:  Diagnosis Date   Acid reflux 07/04/2018   Anemia    Aneurysm of unspecified site [facility]) 2023   Brain aneurysm 2022   Cannabinoid hyperemesis syndrome 10/27/2018   Celiac disease/sprue    Cyclic vomiting syndrome    Dyspnea    in past per patient 11/24/21   Generalized anxiety disorder 07/04/2018   History of colon polyps    last colonoscopy 2018 removed multiple benign polyps   Migraines 07/04/2018   Past Surgical History:  Procedure Laterality Date   ABDOMINAL HYSTERECTOMY  2006   APPENDECTOMY  1999   COLONOSCOPY  2018   COLONOSCOPY W/ POLYPECTOMY  04/2020   K Beavers TA, 3 yr recall   COLONOSCOPY W/ POLYPECTOMY  06/20/2023   Darol [facility] at [facility]   ESOPHAGOGASTRODUODENOSCOPY  2018   IR 3D INDEPENDENT WKST  09/01/2021   IR ANGIO INTRA EXTRACRAN SEL COM CAROTID INNOMINATE BILAT MOD SED  09/01/2021   IR ANGIO INTRA EXTRACRAN SEL INTERNAL CAROTID UNI R MOD SED  11/29/2021   IR ANGIO VERTEBRAL SEL VERTEBRAL BILAT MOD SED  09/01/2021   IR ANGIOGRAM FOLLOW UP STUDY  11/29/2021   IR CT HEAD LTD  11/29/2021   IR RADIOLOGIST EVAL & MGMT  08/21/2021   IR RADIOLOGIST EVAL & MGMT  09/07/2021   IR RADIOLOGIST EVAL & MGMT  12/19/2021   IR TRANSCATH/EMBOLIZ  11/27/2021   RADIOLOGY WITH ANESTHESIA N/A 11/27/2021   Procedure: Alfrieda Antes;  Surgeon: Luellen Sages, MD;  Location: [facility] OR;  Service: Radiology;   Laterality: N/A;    Allergies  Allergies  Allergen Reactions   Zofran  [Ondansetron  Hcl] Anaphylaxis   Egg-Derived Products Other (See Comments)    Severe stomach cramps   Morphine  And Codeine Itching   Zofran  [Ondansetron ]      Labs/Other Studies Reviewed    The following studies were reviewed today:  Cardiac Studies & Procedures   ______________________________________________________________________________________________     ECHOCARDIOGRAM  ECHOCARDIOGRAM COMPLETE 03/09/2024  Narrative ECHOCARDIOGRAM REPORT    Patient Name:   Allison Leonard Date of Exam: 03/09/2024 Medical Rec #:  [id number]               Height:       62.0 in Accession #:    [id number]              Weight:       108.5 lb Date of Birth:  03-15-1976               BSA:          1.474 m Patient Age:    47 years                BP:           124/86 mmHg Patient Gender: F                       HR:  100 bpm. Exam Location:  [address]  Procedure: 2D Echo, 3D Echo, Cardiac Doppler and Color Doppler (Both Spectral and Color Flow Doppler were utilized during procedure).  Indications:    I05.0 Mitral stenosis  History:        Patient has prior history of Echocardiogram examinations, most recent 12/07/2022. Signs/Symptoms:Dyspnea; Risk Factors:Hypertension.  Sonographer:    Juventino Oppenheim RCS Referring Phys: [id number] Cathay Clonts O'NEAL  IMPRESSIONS   1. Compared with the echo 12/2022, systolic function has decreased from 50-55% to 25-30%. Left ventricular ejection fraction, by estimation, is 25 to 30%. The left ventricle has severely decreased function. The left ventricle demonstrates global hypokinesis. Left ventricular diastolic parameters are consistent with Grade I diastolic dysfunction (impaired relaxation). Elevated left ventricular end-diastolic pressure. 2. Right ventricular systolic function is normal. The right ventricular size is normal. There is normal pulmonary artery  systolic pressure. 3. 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 No evidence of mitral stenosis. 4. The aortic valve is tricuspid. Aortic valve regurgitation is moderate. No aortic stenosis is present. Aortic regurgitation PHT measures 412 msec. 5. The inferior vena cava is normal in size with greater than 50% respiratory variability, suggesting right atrial pressure of 3 mmHg.  FINDINGS Left Ventricle: Compared with the echo 12/2022, systolic function has decreased from 50-55% to 25-30%. Left ventricular ejection fraction, by estimation, is 25 to 30%. The left ventricle has severely decreased function. The left ventricle demonstrates global hypokinesis. The left ventricular internal cavity size was normal in size. There is no left ventricular hypertrophy. Left ventricular diastolic parameters are consistent with Grade I diastolic dysfunction (impaired relaxation). Elevated left ventricular end-diastolic pressure.   LV Wall Scoring: There is diffuse hypokinesis.  Right Ventricle: The right ventricular size is normal. No increase in right ventricular wall thickness. Right ventricular systolic function is normal. There is normal pulmonary artery systolic pressure. The tricuspid regurgitant velocity is 1.44 m/s, and with an assumed right atrial pressure of 3 mmHg, the estimated right ventricular systolic pressure is 11.3 mmHg.  Left Atrium: Left atrial size was normal in size.  Right Atrium: Right atrial size was normal in size.  Pericardium: Trivial pericardial effusion is present.  Mitral Valve: The mitral valve is rheumatic. There is mild thickening of the mitral valve leaflet(s). Mild mitral valve regurgitation. No evidence of mitral valve stenosis. MV peak gradient, 13.2 mmHg. The mean mitral valve gradient is 4.7 mmHg.  Tricuspid Valve: The tricuspid valve is normal in structure. Tricuspid valve regurgitation is trivial. No evidence of tricuspid stenosis.  Aortic  Valve: The aortic valve is tricuspid. Aortic valve regurgitation is moderate. Aortic regurgitation PHT measures 412 msec. No aortic stenosis is present.  Pulmonic Valve: The pulmonic valve was normal in structure. Pulmonic valve regurgitation is not visualized. No evidence of pulmonic stenosis.  Aorta: The aortic root is normal in size and structure.  Venous: The inferior vena cava is normal in size with greater than 50% respiratory variability, suggesting right atrial pressure of 3 mmHg.  IAS/Shunts: No atrial level shunt detected by color flow Doppler.  Additional Comments: 3D was performed not requiring image post processing on an independent workstation and was normal.   LEFT VENTRICLE PLAX 2D LVIDd:         5.70 cm   Diastology LVIDs:         5.00 cm   LV e' medial:    6.31 cm/s LV PW:         0.90 cm  LV E/e' medial:  15.7 LV IVS:        0.90 cm   LV e' lateral:   9.79 cm/s LVOT diam:     2.00 cm   LV E/e' lateral: 10.1 LV SV:         38 LV SV Index:   26 LVOT Area:     3.14 cm  3D Volume EF: 3D EF:        27 % LV EDV:       167 ml LV ESV:       121 ml LV SV:        45 ml  RIGHT VENTRICLE RV Basal diam:  1.90 cm RV S prime:     7.40 cm/s TAPSE (M-mode): 1.8 cm RVSP:           11.3 mmHg  LEFT ATRIUM             Index        RIGHT ATRIUM           Index LA diam:        2.40 cm 1.63 cm/m   RA Pressure: 3.00 mmHg LA Vol (A2C):   45.8 ml 31.07 ml/m  RA Area:     7.44 cm LA Vol (A4C):   23.5 ml 15.94 ml/m  RA Volume:   11.90 ml  8.07 ml/m LA Biplane Vol: 33.1 ml 22.46 ml/m AORTIC VALVE LVOT Vmax:   75.40 cm/s LVOT Vmean:  45.550 cm/s LVOT VTI:    0.122 m AI PHT:      412 msec  AORTA Ao Root diam: 2.70 cm Ao Asc diam:  2.50 cm  MITRAL VALVE                TRICUSPID VALVE MV Area (PHT): 11.16 cm    TR Peak grad:   8.3 mmHg MV Area VTI:   1.08 cm     TR Vmax:        144.00 cm/s MV Peak grad:  13.2 mmHg    Estimated RAP:  3.00 mmHg MV Mean grad:  4.7 mmHg      RVSP:           11.3 mmHg MV Vmax:       1.82 m/s MV Vmean:      95.5 cm/s    SHUNTS MV Decel Time: 68 msec      Systemic VTI:  0.12 m MR Peak grad: 82.4 mmHg     Systemic Diam: 2.00 cm MR Mean grad: 53.0 mmHg MR Vmax:      454.00 cm/s MR Vmean:     345.0 cm/s MV E velocity: 99.00 cm/s MV A velocity: 172.00 cm/s MV E/A ratio:  0.58  Maudine Sos MD Electronically signed by Maudine Sos MD Signature Date/Time: 03/09/2024/3:29:56 PM    Final      CT SCANS  CT CORONARY MORPH W/CTA COR W/SCORE 12/27/2022  Addendum 12/30/2022 11:12 AM ADDENDUM REPORT: 12/30/2022 11:09  EXAM: OVER-READ INTERPRETATION  CT CHEST  The following report is an over-read performed by radiologist Dr. Mel Spine [facility] on 12/30/2022. This over-read does not include interpretation of cardiac or coronary anatomy or pathology. The coronary CTA interpretation by the cardiologist is attached.  COMPARISON:  Chest CTA 01/04/2022  FINDINGS: Vascular: No aortic atherosclerosis. The included aorta is normal in caliber. No pulmonary embolus where included on this exam not tailored to pulmonary artery assessment.  Mediastinum/nodes: No adenopathy or mass. Unremarkable esophagus.  Lungs: Subsegmental linear atelectasis or scarring within the right middle lobe and lingula. No pulmonary nodule. No pleural fluid. The included airways are patent.  Upper abdomen: No acute or unexpected findings. Incidental punctate granuloma in the right lobe of the liver.  Musculoskeletal: There are no acute or suspicious osseous abnormalities. Mild pectus excavatum deformity.  IMPRESSION: 1. Minor subsegmental atelectasis or scarring in the right middle lobe and lingula. 2. Mild pectus excavatum deformity.   Electronically Signed By: Chadwick Colonel M.D. On: 12/30/2022 11:09  Narrative CLINICAL DATA:  Chest pain  EXAM: Cardiac/Coronary CTA  TECHNIQUE: A non-contrast, gated CT  scan was obtained with axial slices of 3 mm through the heart for calcium scoring. Calcium scoring was performed using the Agatston method. A 70 kV prospective, gated, contrast cardiac scan was obtained. Gantry rotation speed was 250 msecs and collimation was 0.6 mm. Two sublingual nitroglycerin  tablets (0.8 mg) were given. The 3D data set was reconstructed in 5% intervals of the 35-75% of the R-R cycle. Diastolic phases were analyzed on a dedicated workstation using MPR, MIP, and VRT modes. The patient received 95 cc of contrast.  FINDINGS: Image quality: Excellent.  Noise artifact is: Limited.  Coronary Arteries:  Normal coronary origin.  Right dominance.  Left main: The left main is a large caliber vessel with a normal take off from the left coronary cusp that bifurcates to form a left anterior descending artery and a left circumflex artery. trifurcates into a LAD, LCX, and ramus intermedius. There is no plaque or stenosis.  Left anterior descending artery: The LAD is patent without evidence of plaque or stenosis. The LAD gives off 1 patent diagonal branch.  Left circumflex artery: The LCX is non-dominant and patent with no evidence of plaque or stenosis. The LCX gives off 1 patent obtuse marginal branch.  Right coronary artery: The RCA is dominant with normal take off from the right coronary cusp. There is no evidence of plaque or stenosis. The RCA terminates as a PDA without evidence of plaque or stenosis.  Right Atrium: Right atrial size is within normal limits.  Right Ventricle: The right ventricular cavity is within normal limits.  Left Atrium: Left atrial size is normal in size with no left atrial appendage filling defect.  Left Ventricle: The ventricular cavity size is within normal limits. Small apical diverticulum.  Pulmonary arteries: Normal in size without proximal filling defect.  Pulmonary veins: Normal pulmonary venous drainage.  Pericardium: Normal  thickness without significant effusion or calcium present.  Cardiac valves: The aortic valve is trileaflet without significant calcification. The mitral valve is normal without significant calcification.  Aorta: Normal caliber without significant disease.  Extra-cardiac findings: See attached radiology report for non-cardiac structures.  IMPRESSION: 1. Coronary calcium score of 0.  2. Normal coronary origin with right dominance.  3. Normal coronary arteries.  RECOMMENDATIONS: 1. No evidence of CAD (0%). Consider non-atherosclerotic causes of chest pain.  Jackquelyn Mass, MD  Electronically Signed: By: Jackquelyn Mass M.D. On: 12/27/2022 21:55     ______________________________________________________________________________________________     Recent Labs: 11/18/2023: ALT 19; BUN 22; Creatinine, Ser 0.70; Hemoglobin 14.1; Platelets 243; Potassium 4.1; Sodium 130  Recent Lipid Panel No results found for: "CHOL", "TRIG", "HDL", "CHOLHDL", "VLDL", "LDLCALC", "LDLDIRECT"  History of Present Illness    48 year old female with the above past medical history including chronic systolic heart failure, rheumatic valvular heart disease (aortic valve regurgitation, mitral valve stenosis, mitral valve regurgitation), right ICA aneurysm s/p embolization, hypertension, cyclic vomiting  syndrome, and migraines.  She has a history of rheumatic fever with subsequent valvular heart disease.  Echocardiogram in 01/2022 showed EF 40 to 45%.  Echocardiogram in 2/24 showed EF improved to 50 to 55%.  She has a history of R ICA aneurysm s/p embolization in 11/2021. Coronary CTA in 2/24 revealed coronary calcium score of 0, normal coronary arteries.  She was last seen in the office on 03/29/2023 and was stable from a cardiac standpoint.  Repeat echocardiogram in 03/2024 showed EF 25 to 30%, severely decreased LV function, LV global hypokinesis, G1 DD, elevated LVEDP, normal RV systolic function, mild rheumatic  mitral valve regurgitation, moderate aortic valve regurgitation (per Dr. Edsel Grace review, echo showed moderate to severe aortic regurgitation, rheumatic mitral valve stenosis, MR).  It was noted that her valvular heart disease was likely contributing to her decreased EF.  She was not considered a candidate for cardiac MRI in the setting of internal carotid artery embolization. TEE was recommended with escalation of GDMT.  She presents today for follow-up.  Since her last visit she has been stable overall from a cardiac standpoint.  She notes intermittent fleeting chest discomfort, denies any exertional symptoms concerning for angina.  She does have some mild shortness of breath with exertion, she denies any palpitations, dizziness, edema, PND, orthopnea, weight gain.  She reports a significant amount of personal stress.  She cares for her adult son who has Down syndrome.  She is eager to discuss her valvular heart disease and plans for management.  Home Medications    Current Outpatient Medications  Medication Sig Dispense Refill   clopidogrel  (PLAVIX ) 75 MG tablet Take 0.5 tablets (37.5 mg total) by mouth daily. 90 tablet 0   dicyclomine  (BENTYL ) 20 MG tablet TAKE 1 TABLET (20 MG TOTAL) BY MOUTH 3 (THREE) TIMES DAILY BEFORE MEALS. 90 tablet 2   famotidine  (PEPCID ) 20 MG tablet Take 1 tablet (20 mg total) by mouth 2 (two) times daily. 180 tablet 0   losartan (COZAAR) 25 MG tablet Take 1 tablet (25 mg total) by mouth daily. 90 tablet 3   lubiprostone  (AMITIZA ) 24 MCG capsule Take 1 capsule (24 mcg total) by mouth 2 (two) times daily with a meal. 60 capsule 2   metoprolol  succinate (TOPROL  XL) 25 MG 24 hr tablet Take 1 tablet (25 mg total) by mouth daily. 90 tablet 3   NURTEC 75 MG TBDP DISSOLVE 1 TABLET BY MOUTH AT EARLIEST ONSET OF MIGRAINE AS NEEDED. MAX 1 TAB PER 24 HOURS 16 tablet 5   promethazine  (PHENERGAN ) 25 MG tablet Take 1 tablet (25 mg total) by mouth every 6 (six) hours as needed for nausea  or vomiting. 10 tablet 0   promethazine  (PHENERGAN ) 25 MG tablet TAKE 1 TABLET BY MOUTH EVERY 6 HOURS AS NEEDED FOR NAUSEA OR VOMITING. 28 tablet 0   tiZANidine  (ZANAFLEX ) 4 MG tablet Take 1 tablet (4 mg total) by mouth 3 (three) times daily as needed for muscle spasms. 60 tablet 0   Current Facility-Administered Medications  Medication Dose Route Frequency Provider Last Rate Last Admin   0.9 %  sodium chloride  infusion  500 mL Intravenous Once Elois Hair, MD         Review of Systems    She denies chest pain, palpitations, pnd, orthopnea, n, v, dizziness, syncope, edema, weight gain, or early satiety. All other systems reviewed and are otherwise negative except as noted above.   Physical Exam    VS:  BP 117/74 (BP  Location: Right Arm)   Pulse 98   Ht 5\' 2"  (1.575 m)   Wt 111 lb 9.6 oz (50.6 kg)   SpO2 96%   BMI 20.41 kg/m  GEN: Well nourished, well developed, in no acute distress. HEENT: normal. Neck: Supple, no JVD, carotid bruits, or masses. Cardiac: RRR, 2/6 murmur, no rubs, or gallops. No clubbing, cyanosis, edema.  Radials/DP/PT 2+ and equal bilaterally.  Respiratory:  Respirations regular and unlabored, clear to auscultation bilaterally. GI: Soft, nontender, nondistended, BS + x 4. MS: no deformity or atrophy. Skin: warm and dry, no rash. Neuro:  Strength and sensation are intact. Psych: Normal affect.  Accessory Clinical Findings    ECG personally reviewed by me today - EKG Interpretation Date/Time:  Tuesday April 07 2024 09:58:09 EDT Ventricular Rate:  98 PR Interval:  176 QRS Duration:  84 QT Interval:  390 QTC Calculation: 497 R Axis:   104  Text Interpretation: Sinus rhythm with occasional Premature ventricular complexes Left atrial enlargement Rightward axis Biventricular hypertrophy Prolonged QT When compared with ECG of 04-Jan-2022 21:17, PREVIOUS ECG IS PRESENT Confirmed by Marlana Silvan ([id number]) on 04/07/2024 10:01:50 AM  - no acute changes.   Lab  Results  Component Value Date   WBC 12.9 (H) 11/18/2023   HGB 14.1 11/18/2023   HCT 45.7 11/18/2023   MCV 85.6 11/18/2023   PLT 243 11/18/2023   Lab Results  Component Value Date   CREATININE 0.70 11/18/2023   BUN 22 (H) 11/18/2023   NA 130 (L) 11/18/2023   K 4.1 11/18/2023   CL 94 (L) 11/18/2023   CO2 19 (L) 11/18/2023   Lab Results  Component Value Date   ALT 19 11/18/2023   AST 27 11/18/2023   ALKPHOS 72 11/18/2023   BILITOT 0.8 11/18/2023   No results found for: "CHOL", "HDL", "LDLCALC", "LDLDIRECT", "TRIG", "CHOLHDL"  No results found for: "HGBA1C"  Assessment & Plan    1. Chronic systolic heart failure: Most recent echo in 03/2024 showed EF 25 to 30%, severely decreased LV function, LV global hypokinesis, G1 DD, elevated LVEDP, normal RV systolic function, mild rheumatic mitral valve regurgitation, moderate aortic valve regurgitation (per Dr. Edsel Grace review, echo showed moderate to severe aortic regurgitation, rheumatic mitral valve stenosis, MR).  It was noted that her valvular heart disease was likely contributing to her decreased EF. Not a candidate for cardiac MRI in the setting of internal carotid artery embolization. She does report mild shortness of breath with activity, generally euvolemic and well compensated on exam.  GDMT somewhat limited in the setting of borderline BP.  Will trial metoprolol  succinate 25 mg daily.  Will also trial losartan 25 mg daily.  I do not think BP will tolerate Entresto at this time. Continue to monitor BP and report SBP consistently less than 100 mmHg, HR consistently less than 55 bpm.  Will update BMET, CBC with repeat BMET in 2 weeks in the setting of new losartan. Consider escalation of GDMT as able.    2. Valvular heart disease: Most recent echocardiogram concerning for moderate to severe aortic valve regurgitation, rheumatic mitral valve stenosis, MR.  She reports shortness of breath with activity, euvolemic and well compensated on exam.   Follow-up TEE was recommended.  Through shared decision making, will pursue TEE.  Will update CBC, BMET prior to procedure.  Will initiate GDMT as above.  Further recommendations pending TEE.  Informed Consent   Shared Decision Making/Informed Consent   The risks [esophageal damage, perforation (1:10,000 risk),  bleeding, pharyngeal hematoma as well as other potential complications associated with conscious sedation including aspiration, arrhythmia, respiratory failure and death], benefits (treatment guidance and diagnostic support) and alternatives of a transesophageal echocardiogram were discussed in detail with Ms. Lehrman and she is willing to proceed.  TEE scheduled for 04/28/2024 at 8 AM with Dr. Chancy Comber.      3. R ICA aneurysm:  S/p embolization in 11/2021.  Stable.  Continue Plavix .  4. Hypertension: BP well controlled.  Continue to monitor BP as above with initiation of GDMT.    5. Cyclic vomiting syndrome: She takes daily Phenergan .  QT prolongation noted  on EKG.  However, she states Phenergan  is the only medication that controls her symptoms.  Continue to monitor. Following with GI.    6. Disposition: Follow-up in 1 month with APP, follow-up as scheduled with Dr. Rolm Clos in 06/2024.       Jude Norton, NP 04/07/2024, 10:45 AM

## 2024-04-07 NOTE — Patient Instructions (Addendum)
 Medication Instructions:  Start Metoprolol  Succinate 25 mg daily Start Losartan 25 mg daily  *If you need a refill on your cardiac medications before your next appointment, please call your pharmacy*  Lab Work: BMET, CBC prior to procedure BMET in 2 weeks  Testing/Procedures: Your physician has requested that you have a TEE. During a TEE, sound waves are used to create images of your heart. It provides your doctor with information about the size and shape of your heart and how well your heart's chambers and valves are working. In this test, a transducer is attached to the end of a flexible tube that's guided down your throat and into your esophagus (the tube leading from you mouth to your stomach) to get a more detailed image of your heart. You are not awake for the procedure. Please see the instruction sheet given to you today. For further information please visit [email or web address]   Follow-Up: At [facility], you and your health needs are our priority.  As part of our continuing mission to provide you with exceptional heart care, our providers are all part of one team.  This team includes your primary Cardiologist (physician) and Advanced Practice Providers or APPs (Physician Assistants and Nurse Practitioners) who all work together to provide you with the care you need, when you need it.  Your next appointment:    1 month f/u with any APP  Provider:   Any APP    We recommend signing up for the patient portal called "MyChart".  Sign up information is provided on this After Visit Summary.  MyChart is used to connect with patients for Virtual Visits (Telemedicine).  Patients are able to view lab/test results, encounter notes, upcoming appointments, etc.  Non-urgent messages can be sent to your provider as well.   To learn more about what you can do with MyChart, go to [email or web address].   Other Instructions       Dear Allison Leonard  You are scheduled for a  TEE (Transesophageal Echocardiogram) on Tuesday, June 24 with Dr. Chancy Comber.  Please arrive at the [facility] (Main Entrance A) at [facility]: [address] [place], [place] [address] at 7:00 AM (This time is 1 hour(s) before your procedure to ensure your preparation).   Free valet parking service is available. You will check in at ADMITTING.   *Please Note: You will receive a call the day before your procedure to confirm the appointment time. That time may have changed from the original time based on the schedule for that day.*    DIET:  Nothing to eat or drink after midnight except a sip of water with medications (see medication instructions below)  MEDICATION INSTRUCTIONS: !!IF ANY NEW MEDICATIONS ARE STARTED AFTER TODAY, PLEASE NOTIFY YOUR PROVIDER AS SOON AS POSSIBLE!!  FYI: Medications such as Semaglutide (Ozempic, [place]), Tirzepatide (Mounjaro, Zepbound), Dulaglutide (Trulicity), etc ("GLP1 agonists") AND Canagliflozin (Invokana), Dapagliflozin (Farxiga), Empagliflozin (Jardiance), Ertugliflozin (Steglatro), Bexagliflozin [employer]) or any combination with one of these drugs such as Invokamet (Canagliflozin/Metformin), Synjardy (Empagliflozin/Metformin), etc ("SGLT2 inhibitors") must be held around the time of a procedure. This is not a comprehensive list of all of these drugs. Please review all of your medications and talk to your provider if you take any one of these. If you are not sure, ask your provider.   LABS:   Come to the lab at the [facility] ([address], [place], 1st Floor) between the hours of 8:00  am and 4:30 pm. You do NOT have to be fasting.  FYI:  For your safety, and to allow us  to monitor your vital signs accurately during the surgery/procedure we request: If you have artificial nails, gel coating, SNS etc, please have those removed prior to your surgery/procedure. Not having the nail coverings /polish removed  may result in cancellation or delay of your surgery/procedure.  Your support person will be asked to wait in the waiting room during your procedure.  It is OK to have someone drop you off and come back when you are ready to be discharged.  You cannot drive after the procedure and will need someone to drive you home.  Bring your insurance cards.  *Special Note: Every effort is made to have your procedure done on time. Occasionally there are emergencies that occur at the hospital that may cause delays. Please be patient if a delay does occur.

## 2024-04-10 NOTE — Telephone Encounter (Signed)
 Called and spoke to pt; Monge, NP's recommendations given. She is taking both Losartan and Metoprolol  succinate, as prescribed. She will be keeping BP log and will send us  her numbers in about one week for review. Advised her to call Triage with immediate concerns. Pt verbalized understanding; no other concerns.

## 2024-04-13 ENCOUNTER — Ambulatory Visit: Payer: Self-pay

## 2024-04-14 ENCOUNTER — Other Ambulatory Visit: Payer: Self-pay | Admitting: Radiology

## 2024-04-14 ENCOUNTER — Other Ambulatory Visit (HOSPITAL_COMMUNITY): Payer: Self-pay | Admitting: Interventional Radiology

## 2024-04-14 ENCOUNTER — Encounter: Payer: Self-pay | Admitting: *Deleted

## 2024-04-14 ENCOUNTER — Telehealth (HOSPITAL_COMMUNITY): Payer: Self-pay

## 2024-04-14 DIAGNOSIS — I671 Cerebral aneurysm, nonruptured: Secondary | ICD-10-CM

## 2024-04-14 MED ORDER — CLOPIDOGREL BISULFATE 75 MG PO TABS: 37.5000 mg | ORAL_TABLET | Freq: Every day | ORAL | 0 refills | Status: DC

## 2024-04-14 NOTE — Telephone Encounter (Signed)
 Pt called for a refill of her Plavix . Message sent to our PA Caitlyn to call this into the [facility] listed in her chart. AB

## 2024-04-16 ENCOUNTER — Telehealth (HOSPITAL_COMMUNITY): Payer: Self-pay

## 2024-04-16 NOTE — Telephone Encounter (Signed)
-----   Message from Orson Blalock sent at 04/14/2024 11:16 AM EDT ----- Regarding: RE: Plavix  refill Natalie Bailey,  Just spoke with Dev about her. I'll go ahead and send her refill in for now. He would like to schedule her for another MRA after which we may likely switch her to just baby ASA daily.   Thanks, McKenzie ----- Message ----- From: Faye Hoops Sent: 04/14/2024  11:06 AM EDT To: Orson Blalock, PA-C Subject: Plavix  refill                                  Morning,   Can you please call in a refill of her Plavix  to the [facility] in [facility]?  Thanks,  Merlinda Starling

## 2024-04-20 ENCOUNTER — Ambulatory Visit (HOSPITAL_COMMUNITY)
Admission: RE | Admit: 2024-04-20 | Discharge: 2024-04-20 | Disposition: A | Discharge status: Home / Self Care | Source: Ambulatory Visit | Attending: Interventional Radiology | Admitting: Interventional Radiology

## 2024-04-20 ENCOUNTER — Encounter: Payer: Self-pay | Admitting: Neurology

## 2024-04-20 DIAGNOSIS — I671 Cerebral aneurysm, nonruptured: Secondary | ICD-10-CM | POA: Diagnosis not present

## 2024-04-20 DIAGNOSIS — I639 Cerebral infarction, unspecified: Secondary | ICD-10-CM | POA: Diagnosis not present

## 2024-04-21 MED ORDER — PREDNISONE 10 MG (21) PO TBPK: ORAL_TABLET | ORAL | 0 refills | Status: DC

## 2024-04-22 ENCOUNTER — Encounter: Payer: Self-pay | Admitting: Neurology

## 2024-04-24 ENCOUNTER — Encounter: Payer: Self-pay | Admitting: Student

## 2024-04-24 DIAGNOSIS — I5022 Chronic systolic (congestive) heart failure: Secondary | ICD-10-CM | POA: Diagnosis not present

## 2024-04-24 DIAGNOSIS — I051 Rheumatic mitral insufficiency: Secondary | ICD-10-CM | POA: Diagnosis not present

## 2024-04-24 DIAGNOSIS — I671 Cerebral aneurysm, nonruptured: Secondary | ICD-10-CM | POA: Diagnosis not present

## 2024-04-24 DIAGNOSIS — I050 Rheumatic mitral stenosis: Secondary | ICD-10-CM | POA: Diagnosis not present

## 2024-04-24 DIAGNOSIS — I061 Rheumatic aortic insufficiency: Secondary | ICD-10-CM | POA: Diagnosis not present

## 2024-04-24 DIAGNOSIS — I10 Essential (primary) hypertension: Secondary | ICD-10-CM | POA: Diagnosis not present

## 2024-04-25 LAB — CBC
Hematocrit: 43.8 % (ref 34.0–46.6)
Hemoglobin: 13.5 g/dL (ref 11.1–15.9)
MCH: 26.9 pg (ref 26.6–33.0)
MCHC: 30.8 g/dL — ABNORMAL LOW (ref 31.5–35.7)
MCV: 87 fL (ref 79–97)
Platelets: 204 10*3/uL (ref 150–450)
RBC: 5.02 x10E6/uL (ref 3.77–5.28)
RDW: 12.9 % (ref 11.7–15.4)
WBC: 8.6 10*3/uL (ref 3.4–10.8)

## 2024-04-25 LAB — BASIC METABOLIC PANEL WITH GFR
BUN/Creatinine Ratio: 25 — ABNORMAL HIGH (ref 9–23)
BUN: 15 mg/dL (ref 6–24)
CO2: 22 mmol/L (ref 20–29)
Calcium: 9.8 mg/dL (ref 8.7–10.2)
Chloride: 98 mmol/L (ref 96–106)
Creatinine, Ser: 0.61 mg/dL (ref 0.57–1.00)
Glucose: 95 mg/dL (ref 70–99)
Potassium: 4.7 mmol/L (ref 3.5–5.2)
Sodium: 140 mmol/L (ref 134–144)
eGFR: 111 mL/min/{1.73_m2} (ref >59)

## 2024-04-27 NOTE — Anesthesia Preprocedure Evaluation (Addendum)
 Anesthesia Evaluation  Patient identified by MRN, date of birth, ID band Patient awake    Reviewed: Allergy & Precautions, NPO status , Patient's Chart, lab work & pertinent test results  Airway Mallampati: III  TM Distance: >3 FB Neck ROM: Full    Dental  (+) Dental Advisory Given, Teeth Intact,    Pulmonary shortness of breath and with exertion, Patient abstained from smoking., former smoker   Pulmonary exam normal breath sounds clear to auscultation       Cardiovascular hypertension, Pt. on medications +CHF and + DOE  + Valvular Problems/Murmurs (MS) AI and MR  Rhythm:Regular Rate:Normal + Diastolic murmurs Echo 03/2024  1. Compared with the echo 12/2022, systolic function has decreased from 50-55% to 25-30%. Left ventricular ejection fraction, by estimation, is 25 to 30%. The left ventricle has severely decreased function. The left ventricle demonstrates global hypokinesis. Left ventricular diastolic parameters are consistent with Grade I diastolic dysfunction (impaired relaxation). Elevated left ventricular end-diastolic pressure.   2. Right ventricular systolic function is normal. The right ventricular size is normal. There is normal pulmonary artery systolic pressure.   3. 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 No evidence of mitral stenosis.   4. The aortic valve is tricuspid. Aortic valve regurgitation is moderate. No aortic stenosis is present. Aortic regurgitation PHT measures 412 msec.   5. The inferior vena cava is normal in size with greater than 50% respiratory variability, suggesting right atrial pressure of 3 mmHg.     Neuro/Psych  Headaches PSYCHIATRIC DISORDERS Anxiety        GI/Hepatic ,GERD  Controlled and Medicated,,(+)     substance abuse  marijuana use  Endo/Other  negative endocrine ROS    Renal/GU negative Renal ROS     Musculoskeletal negative musculoskeletal ROS (+)     Abdominal   Peds  Hematology  (+) Blood dyscrasia (on plavix ), anemia   Anesthesia Other Findings   Reproductive/Obstetrics                             Anesthesia Physical Anesthesia Plan  ASA: 4  Anesthesia Plan: General   Post-op Pain Management:    Induction: Intravenous, Rapid sequence and Cricoid pressure planned  PONV Risk Score and Plan: 2 and Propofol  infusion, TIVA, Treatment may vary due to age or medical condition and Ondansetron   Airway Management Planned: Oral ETT  Additional Equipment:   Intra-op Plan:   Post-operative Plan: Extubation in OR  Informed Consent: I have reviewed the patients History and Physical, chart, labs and discussed the procedure including the risks, benefits and alternatives for the proposed anesthesia with the patient or authorized representative who has indicated his/her understanding and acceptance.     Dental advisory given  Plan Discussed with: CRNA  Anesthesia Plan Comments:         Anesthesia Quick Evaluation

## 2024-04-27 NOTE — Progress Notes (Signed)
 Spoke to patient and instructed them to come at 07:00  and to be NPO after 0000. Medications reviewed.  Confirmed that patient will have a ride home and someone to stay with them for 24 hours after the procedure.

## 2024-04-28 ENCOUNTER — Ambulatory Visit (HOSPITAL_COMMUNITY)
Admission: RE | Admit: 2024-04-28 | Discharge: 2024-04-28 | Disposition: A | Discharge status: Home / Self Care | Attending: Internal Medicine | Admitting: Internal Medicine

## 2024-04-28 ENCOUNTER — Ambulatory Visit (HOSPITAL_COMMUNITY): Payer: Self-pay | Admitting: Anesthesiology

## 2024-04-28 ENCOUNTER — Ambulatory Visit (HOSPITAL_COMMUNITY)

## 2024-04-28 ENCOUNTER — Encounter (HOSPITAL_COMMUNITY): Payer: Self-pay | Admitting: Internal Medicine

## 2024-04-28 ENCOUNTER — Encounter (HOSPITAL_COMMUNITY)
Admission: RE | Disposition: A | Discharge status: Home / Self Care | Payer: Self-pay | Source: Home / Self Care | Attending: Internal Medicine

## 2024-04-28 ENCOUNTER — Other Ambulatory Visit: Payer: Self-pay

## 2024-04-28 DIAGNOSIS — Z87891 Personal history of nicotine dependence: Secondary | ICD-10-CM | POA: Insufficient documentation

## 2024-04-28 DIAGNOSIS — I509 Heart failure, unspecified: Secondary | ICD-10-CM | POA: Diagnosis not present

## 2024-04-28 DIAGNOSIS — I080 Rheumatic disorders of both mitral and aortic valves: Secondary | ICD-10-CM | POA: Insufficient documentation

## 2024-04-28 DIAGNOSIS — K219 Gastro-esophageal reflux disease without esophagitis: Secondary | ICD-10-CM | POA: Insufficient documentation

## 2024-04-28 DIAGNOSIS — I061 Rheumatic aortic insufficiency: Secondary | ICD-10-CM

## 2024-04-28 DIAGNOSIS — I051 Rheumatic mitral insufficiency: Secondary | ICD-10-CM | POA: Diagnosis not present

## 2024-04-28 DIAGNOSIS — I671 Cerebral aneurysm, nonruptured: Secondary | ICD-10-CM | POA: Diagnosis not present

## 2024-04-28 DIAGNOSIS — I060 Rheumatic aortic stenosis: Secondary | ICD-10-CM

## 2024-04-28 DIAGNOSIS — Z79899 Other long term (current) drug therapy: Secondary | ICD-10-CM | POA: Diagnosis not present

## 2024-04-28 DIAGNOSIS — Z7902 Long term (current) use of antithrombotics/antiplatelets: Secondary | ICD-10-CM | POA: Diagnosis not present

## 2024-04-28 DIAGNOSIS — I050 Rheumatic mitral stenosis: Secondary | ICD-10-CM

## 2024-04-28 DIAGNOSIS — I5022 Chronic systolic (congestive) heart failure: Secondary | ICD-10-CM | POA: Diagnosis not present

## 2024-04-28 DIAGNOSIS — I110 Hypertensive heart disease with heart failure: Secondary | ICD-10-CM | POA: Insufficient documentation

## 2024-04-28 DIAGNOSIS — G43A0 Cyclical vomiting, not intractable: Secondary | ICD-10-CM | POA: Insufficient documentation

## 2024-04-28 HISTORY — PX: TRANSESOPHAGEAL ECHOCARDIOGRAM (CATH LAB): EP1270

## 2024-04-28 SURGERY — TRANSESOPHAGEAL ECHOCARDIOGRAM (TEE) (CATHLAB)
Anesthesia: Monitor Anesthesia Care

## 2024-04-28 MED ORDER — SUCCINYLCHOLINE CHLORIDE 200 MG/10ML IV SOSY
PREFILLED_SYRINGE | INTRAVENOUS | Status: DC | PRN
  Administered 2024-04-28: 100 mg via INTRAVENOUS

## 2024-04-28 MED ORDER — FENTANYL CITRATE (PF) 250 MCG/5ML IJ SOLN
INTRAMUSCULAR | Status: DC | PRN
Start: 2024-04-28 — End: 2024-04-28
  Administered 2024-04-28: 25 ug via INTRAVENOUS
  Administered 2024-04-28: 50 ug via INTRAVENOUS
  Administered 2024-04-28: 25 ug via INTRAVENOUS

## 2024-04-28 MED ORDER — LIDOCAINE 2% (20 MG/ML) 5 ML SYRINGE
INTRAMUSCULAR | Status: DC | PRN
  Administered 2024-04-28: 60 mg via INTRAVENOUS

## 2024-04-28 MED ORDER — ROCURONIUM BROMIDE 100 MG/10ML IV SOLN
INTRAVENOUS | Status: DC | PRN
  Administered 2024-04-28: 20 mg via INTRAVENOUS
  Administered 2024-04-28: 5 mg via INTRAVENOUS

## 2024-04-28 MED ORDER — PROMETHAZINE (PHENERGAN) 6.25MG IN NS 50ML IVPB
6.2500 mg | Freq: Four times a day (QID) | INTRAVENOUS | Status: AC | PRN
  Administered 2024-04-28: 6.25 mg via INTRAVENOUS
  Filled 2024-04-28: qty 6.25

## 2024-04-28 MED ORDER — FENTANYL CITRATE (PF) 100 MCG/2ML IJ SOLN
INTRAMUSCULAR | Status: AC
  Filled 2024-04-28: qty 2

## 2024-04-28 MED ORDER — SODIUM CHLORIDE 0.9 % IV SOLN: INTRAVENOUS | Status: DC

## 2024-04-28 MED ORDER — DEXAMETHASONE SODIUM PHOSPHATE 10 MG/ML IJ SOLN
INTRAMUSCULAR | Status: DC | PRN
  Administered 2024-04-28: 4 mg via INTRAVENOUS

## 2024-04-28 MED ORDER — SUGAMMADEX SODIUM 200 MG/2ML IV SOLN
INTRAVENOUS | Status: DC | PRN
  Administered 2024-04-28: 200 mg via INTRAVENOUS

## 2024-04-28 MED ORDER — AMISULPRIDE (ANTIEMETIC) 5 MG/2ML IV SOLN: 10.0000 mg | Freq: Once | INTRAVENOUS | Status: DC | PRN

## 2024-04-28 MED ORDER — ETOMIDATE 2 MG/ML IV SOLN
INTRAVENOUS | Status: DC | PRN
  Administered 2024-04-28: 8 mg via INTRAVENOUS

## 2024-04-28 NOTE — Interval H&P Note (Deleted)
 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  The various methods of treatment have been discussed with the patient and family. After consideration of risks, benefits and other options for treatment, the patient has consented to  Procedure(s): TRANSESOPHAGEAL ECHOCARDIOGRAM (N/A) as a surgical intervention.  The patient's history has been reviewed, patient examined, no change in status, stable for surgery.  I have reviewed the patient's chart and labs.  Questions were answered to the patient's satisfaction.     Sabreena Vogan A Keenan Dimitrov

## 2024-04-28 NOTE — Anesthesia Procedure Notes (Signed)
 Procedure Name: Intubation Date/Time: 04/28/2024 8:00 AM  Performed by: Worth Peppers, CRNAPre-anesthesia Checklist: Patient identified, Emergency Drugs available, Suction available and Patient being monitored Patient Re-evaluated:Patient Re-evaluated prior to induction Oxygen Delivery Method: Circle System Utilized Preoxygenation: Pre-oxygenation with 100% oxygen Induction Type: IV induction Ventilation: Mask ventilation without difficulty Laryngoscope Size: Mac and 3 Grade View: Grade II Tube type: Oral Number of attempts: 1 Airway Equipment and Method: Stylet and Oral airway Placement Confirmation: ETT inserted through vocal cords under direct vision, positive ETCO2 and breath sounds checked- equal and bilateral Secured at: 22 cm Tube secured with: Tape Dental Injury: Teeth and Oropharynx as per pre-operative assessment

## 2024-04-28 NOTE — Transfer of Care (Signed)
 Immediate Anesthesia Transfer of Care Note  Patient: Allison Leonard  Procedure(s) Performed: TRANSESOPHAGEAL ECHOCARDIOGRAM  Patient Location: PACU  Anesthesia Type:GENERAL   Level of Consciousness: sedated  Airway & Oxygen Therapy: Patient Spontanous Breathing and Patient connected to face mask oxygen  Post-op Assessment: Report given to RN and Post -op Vital signs reviewed and stable  Post vital signs: Reviewed and stable  Last Vitals:  Vitals Value Taken Time  BP    Temp    Pulse    Resp    SpO2      Last Pain:  Vitals:   04/28/24 0722  TempSrc: Temporal         Complications: No notable events documented.

## 2024-04-28 NOTE — CV Procedure (Signed)
 INDICATIONS: Rheumatic mitral valve disease, aortic valve regurgitation.  PROCEDURE:   Informed consent was obtained prior to the procedure. The risks, benefits and alternatives for the procedure were discussed and the patient comprehended these risks.  Risks include, but are not limited to, cough, sore throat, vomiting, nausea, somnolence, esophageal and stomach trauma or perforation, bleeding, low blood pressure, aspiration, pneumonia, infection, trauma to the teeth and death.    After a procedural time-out, patient intubated with general anesthesia.  During this procedure the patient was administered general anesthesia.  The patient's heart rate, blood pressure, and oxygen saturation were monitored continuously during the procedure. The period of general anesthesia was 45 minutes, of which I was present face-to-face 100% of this time.  The transesophageal probe was inserted in the esophagus and stomach without difficulty and multiple views were obtained.  The patient was kept under observation until the patient left the procedure room.  The patient left the procedure room in stable condition.   Agitated microbubble saline contrast was administered.  COMPLICATIONS:    There were no immediate complications.  FINDINGS:   FORMAL ECHOCARDIOGRAM REPORT PENDING EF 25% Mild RV dysfunction. Mild TR Moderate MR/moderate rheumatic MS Tricuspid aortic valve, central AI, appears moderate-severe AI with reversal TVI challenging to obtain but approximately 8 cm, no AS. No PFO/ASD.    RECOMMENDATIONS:     Dc home when alert  Time Spent Directly with the Patient:  60 minutes   Jessilynn Taft A Letricia Krinsky 04/28/2024, 9:14 AM

## 2024-04-28 NOTE — Interval H&P Note (Signed)
 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  The various methods of treatment have been discussed with the patient and family. After consideration of risks, benefits and other options for treatment, the patient has consented to  Procedure(s): TRANSESOPHAGEAL ECHOCARDIOGRAM (N/A) as a surgical intervention.  The patient's history has been reviewed, patient examined, no change in status, stable for surgery.  I have reviewed the patient's chart and labs.  Questions were answered to the patient's satisfaction.     [name] A Johnathon Mittal

## 2024-04-29 LAB — ECHO TEE
AR max vel: 1.85 cm2
AV Area VTI: 1.74 cm2
AV Area mean vel: 1.85 cm2
AV Mean grad: 3 mmHg
AV Peak grad: 7.6 mmHg
Ao pk vel: 1.38 m/s
Est EF: 25
P 1/2 time: 354 ms
P 1/2 time: 95 ms

## 2024-04-29 NOTE — Anesthesia Postprocedure Evaluation (Signed)
 Anesthesia Post Note  Patient: Allison Leonard  Procedure(s) Performed: TRANSESOPHAGEAL ECHOCARDIOGRAM     Patient location during evaluation: Cath Lab Anesthesia Type: General Level of consciousness: sedated and patient cooperative Pain management: pain level controlled Vital Signs Assessment: post-procedure vital signs reviewed and stable Respiratory status: spontaneous breathing Cardiovascular status: stable Anesthetic complications: no   No notable events documented.  Last Vitals:  Vitals:   04/28/24 1010 04/28/24 1015  BP: 122/60 126/64  Pulse: 67 80  Resp: 16 12  Temp:    SpO2: 100% 100%    Last Pain:  Vitals:   04/28/24 1015  TempSrc:   PainSc: 0-No pain                 Norleen Pope

## 2024-04-30 ENCOUNTER — Ambulatory Visit: Payer: Self-pay | Admitting: Nurse Practitioner

## 2024-05-01 ENCOUNTER — Encounter (HOSPITAL_COMMUNITY): Payer: Self-pay | Admitting: Interventional Radiology

## 2024-05-06 ENCOUNTER — Telehealth (HOSPITAL_COMMUNITY): Payer: Self-pay

## 2024-05-06 NOTE — Telephone Encounter (Signed)
 Pt is aware that Dr. Dolphus is leaving. She will reach out to PCP regarding yearly f/u. She is wanting to know if she should d/c her BT. She hasn't been taking it for the past 2 weeks bc her insurance won't cover it. Will reach out to PA to advise. AB

## 2024-05-06 NOTE — Telephone Encounter (Signed)
-----   Message from [facility] GORMAN LAMP sent at 05/06/2024  2:49 PM EDT ----- Regarding: Dev F/U MRA one year

## 2024-05-11 ENCOUNTER — Telehealth (HOSPITAL_COMMUNITY): Payer: Self-pay

## 2024-05-11 NOTE — Telephone Encounter (Signed)
-----   Message from [facility] sent at 05/07/2024  2:10 PM EDT ----- Regarding: RE: Dev F/U She can stop the Plavix  since she cannot afford it but she should take enteric coated 325 aspirin . ----- Message ----- From: Carolee Rosina BIRCH Sent: 05/07/2024   8:29 AM EDT To: Sari Jarold Lamp, PA-C; Delon LITTIE Amour Subject: RE: Dev F/U                                    She is fine with this but wants to know since D is leaving should she d/c her Plavix . She hasn't taken it in 2 weeks anyway bc her insurance will no longer cover  it. ----- Message ----- From: Lamp Sari Jarold, PA-C Sent: 05/06/2024   2:50 PM EDT To: Delon LITTIE Amour; Rosina BIRCH Carolee Subject: Dev F/U                                        MRA one year

## 2024-05-18 ENCOUNTER — Ambulatory Visit: Payer: Self-pay | Admitting: Cardiovascular Disease

## 2024-05-18 NOTE — Telephone Encounter (Signed)
-----   Message from Damien JAYSON Braver sent at 04/30/2024  6:01 PM EDT ----- See TEE results. Let me know your thoughts on next steps. Thanks -Em

## 2024-05-18 NOTE — Telephone Encounter (Signed)
 Called Mrs. Sizer. Discussed results of TEE. Severe AI. Moderate MS/severe MR. Rheumatic. CCTA showed normal coronaries last year. She is SOB but not LE edema. We discussed she will need surgical repair. I will have her see Damien on 7/24 and get her on entresto/jardiance. She will then see me in early August and we will refer to her to surgery for consideration of AVR/MVR. She was in agreement with this plan.   Darryle T. Barbaraann, MD, [facility], [facility]  [facility]  [address] [place], [place] [address] [phone]  12:36 PM

## 2024-05-25 ENCOUNTER — Ambulatory Visit: Attending: Nurse Practitioner | Admitting: Nurse Practitioner

## 2024-05-25 ENCOUNTER — Encounter: Payer: Self-pay | Admitting: Nurse Practitioner

## 2024-05-25 VITALS — BP 92/56 | HR 87 | Ht 62.0 in | Wt 110.0 lb

## 2024-05-25 DIAGNOSIS — I061 Rheumatic aortic insufficiency: Secondary | ICD-10-CM | POA: Diagnosis not present

## 2024-05-25 DIAGNOSIS — I671 Cerebral aneurysm, nonruptured: Secondary | ICD-10-CM | POA: Insufficient documentation

## 2024-05-25 DIAGNOSIS — I5022 Chronic systolic (congestive) heart failure: Secondary | ICD-10-CM | POA: Insufficient documentation

## 2024-05-25 DIAGNOSIS — I051 Rheumatic mitral insufficiency: Secondary | ICD-10-CM | POA: Diagnosis not present

## 2024-05-25 DIAGNOSIS — I10 Essential (primary) hypertension: Secondary | ICD-10-CM | POA: Insufficient documentation

## 2024-05-25 MED ORDER — EMPAGLIFLOZIN 10 MG PO TABS: 10.0000 mg | ORAL_TABLET | Freq: Every day | ORAL | 3 refills | Status: DC

## 2024-05-25 NOTE — Progress Notes (Unsigned)
 Office Visit    Patient Name: Allison Leonard Date of Encounter: 05/25/2024  Primary Care Provider:  Lennie Raguel MATSU, DO Primary Cardiologist:  Darryle ONEIDA Decent, MD  Chief Complaint    48 year old female with a history of chronic systolic heart failure, rheumatic valvular heart disease (aortic valve regurgitation, mitral valve stenosis, mitral valve regurgitation), right ICA aneurysm s/p embolization, hypertension, cyclic vomiting syndrome, and migraines who presents for follow-up related to valvular heart disease.  Past Medical History    Past Medical History:  Diagnosis Date   Acid reflux 07/04/2018   Anemia    Aneurysm of unspecified site [facility]) 2023   Brain aneurysm 2022   Cannabinoid hyperemesis syndrome 10/27/2018   Celiac disease/sprue    Cyclic vomiting syndrome    Dyspnea    in past per patient 11/24/21   Generalized anxiety disorder 07/04/2018   History of colon polyps    last colonoscopy 2018 removed multiple benign polyps   Migraines 07/04/2018   Past Surgical History:  Procedure Laterality Date   ABDOMINAL HYSTERECTOMY  2006   APPENDECTOMY  1999   COLONOSCOPY  2018   COLONOSCOPY W/ POLYPECTOMY  04/2020   [facility] TA, 3 yr recall   COLONOSCOPY W/ POLYPECTOMY  06/20/2023   Glendia [facility] at [facility]   ESOPHAGOGASTRODUODENOSCOPY  2018   IR 3D INDEPENDENT WKST  09/01/2021   IR ANGIO INTRA EXTRACRAN SEL COM CAROTID INNOMINATE BILAT MOD SED  09/01/2021   IR ANGIO INTRA EXTRACRAN SEL INTERNAL CAROTID UNI R MOD SED  11/29/2021   IR ANGIO VERTEBRAL SEL VERTEBRAL BILAT MOD SED  09/01/2021   IR ANGIOGRAM FOLLOW UP STUDY  11/29/2021   IR CT HEAD LTD  11/29/2021   IR RADIOLOGIST EVAL & MGMT  08/21/2021   IR RADIOLOGIST EVAL & MGMT  09/07/2021   IR RADIOLOGIST EVAL & MGMT  12/19/2021   IR TRANSCATH/EMBOLIZ  11/27/2021   RADIOLOGY WITH ANESTHESIA N/A 11/27/2021   Procedure: EATHER;  Surgeon: Dolphus Carrion, MD;  Location: [facility] OR;  Service: Radiology;   Laterality: N/A;   TRANSESOPHAGEAL ECHOCARDIOGRAM (CATH LAB) N/A 04/28/2024   Procedure: TRANSESOPHAGEAL ECHOCARDIOGRAM;  Surgeon: Loni Soyla LABOR, MD;  Location: [facility] INVASIVE CV LAB;  Service: Cardiovascular;  Laterality: N/A;    Allergies  Allergies  Allergen Reactions   Zofran  [Ondansetron  Hcl] Anaphylaxis   Egg-Derived Products Other (See Comments)    Severe stomach cramps   Morphine  And Codeine Itching   Zofran  [Ondansetron ]      Labs/Other Studies Reviewed    The following studies were reviewed today:  Cardiac Studies & Procedures   ______________________________________________________________________________________________     ECHOCARDIOGRAM  ECHOCARDIOGRAM COMPLETE 03/09/2024  Narrative ECHOCARDIOGRAM REPORT    Patient Name:   Allison Leonard Date of Exam: 03/09/2024 Medical Rec #:  [id number]               Height:       62.0 in Accession #:    [id number]              Weight:       108.5 lb Date of Birth:  February 08, 1976               BSA:          1.474 m Patient Age:    47 years                BP:           124/86 mmHg Patient Gender: F  HR:           100 bpm. Exam Location:  [address]  Procedure: 2D Echo, 3D Echo, Cardiac Doppler and Color Doppler (Both Spectral and Color Flow Doppler were utilized during procedure).  Indications:    I05.0 Mitral stenosis  History:        Patient has prior history of Echocardiogram examinations, most recent 12/07/2022. Signs/Symptoms:Dyspnea; Risk Factors:Hypertension.  Sonographer:    Waldo Guadalajara RCS Referring Phys: [id number] DARRYLE NED O'NEAL  IMPRESSIONS   1. Compared with the echo 12/2022, systolic function has decreased from 50-55% to 25-30%. Left ventricular ejection fraction, by estimation, is 25 to 30%. The left ventricle has severely decreased function. The left ventricle demonstrates global hypokinesis. Left ventricular diastolic parameters are consistent with Grade I diastolic  dysfunction (impaired relaxation). Elevated left ventricular end-diastolic pressure. 2. Right ventricular systolic function is normal. The right ventricular size is normal. There is normal pulmonary artery systolic pressure. 3. 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 No evidence of mitral stenosis. 4. The aortic valve is tricuspid. Aortic valve regurgitation is moderate. No aortic stenosis is present. Aortic regurgitation PHT measures 412 msec. 5. The inferior vena cava is normal in size with greater than 50% respiratory variability, suggesting right atrial pressure of 3 mmHg.  FINDINGS Left Ventricle: Compared with the echo 12/2022, systolic function has decreased from 50-55% to 25-30%. Left ventricular ejection fraction, by estimation, is 25 to 30%. The left ventricle has severely decreased function. The left ventricle demonstrates global hypokinesis. The left ventricular internal cavity size was normal in size. There is no left ventricular hypertrophy. Left ventricular diastolic parameters are consistent with Grade I diastolic dysfunction (impaired relaxation). Elevated left ventricular end-diastolic pressure.   LV Wall Scoring: There is diffuse hypokinesis.  Right Ventricle: The right ventricular size is normal. No increase in right ventricular wall thickness. Right ventricular systolic function is normal. There is normal pulmonary artery systolic pressure. The tricuspid regurgitant velocity is 1.44 m/s, and with an assumed right atrial pressure of 3 mmHg, the estimated right ventricular systolic pressure is 11.3 mmHg.  Left Atrium: Left atrial size was normal in size.  Right Atrium: Right atrial size was normal in size.  Pericardium: Trivial pericardial effusion is present.  Mitral Valve: The mitral valve is rheumatic. There is mild thickening of the mitral valve leaflet(s). Mild mitral valve regurgitation. No evidence of mitral valve stenosis. MV peak gradient,  13.2 mmHg. The mean mitral valve gradient is 4.7 mmHg.  Tricuspid Valve: The tricuspid valve is normal in structure. Tricuspid valve regurgitation is trivial. No evidence of tricuspid stenosis.  Aortic Valve: The aortic valve is tricuspid. Aortic valve regurgitation is moderate. Aortic regurgitation PHT measures 412 msec. No aortic stenosis is present.  Pulmonic Valve: The pulmonic valve was normal in structure. Pulmonic valve regurgitation is not visualized. No evidence of pulmonic stenosis.  Aorta: The aortic root is normal in size and structure.  Venous: The inferior vena cava is normal in size with greater than 50% respiratory variability, suggesting right atrial pressure of 3 mmHg.  IAS/Shunts: No atrial level shunt detected by color flow Doppler.  Additional Comments: 3D was performed not requiring image post processing on an independent workstation and was normal.   LEFT VENTRICLE PLAX 2D LVIDd:         5.70 cm   Diastology LVIDs:         5.00 cm   LV e' medial:    6.31 cm/s LV PW:  0.90 cm   LV E/e' medial:  15.7 LV IVS:        0.90 cm   LV e' lateral:   9.79 cm/s LVOT diam:     2.00 cm   LV E/e' lateral: 10.1 LV SV:         38 LV SV Index:   26 LVOT Area:     3.14 cm  3D Volume EF: 3D EF:        27 % LV EDV:       167 ml LV ESV:       121 ml LV SV:        45 ml  RIGHT VENTRICLE RV Basal diam:  1.90 cm RV S prime:     7.40 cm/s TAPSE (M-mode): 1.8 cm RVSP:           11.3 mmHg  LEFT ATRIUM             Index        RIGHT ATRIUM           Index LA diam:        2.40 cm 1.63 cm/m   RA Pressure: 3.00 mmHg LA Vol (A2C):   45.8 ml 31.07 ml/m  RA Area:     7.44 cm LA Vol (A4C):   23.5 ml 15.94 ml/m  RA Volume:   11.90 ml  8.07 ml/m LA Biplane Vol: 33.1 ml 22.46 ml/m AORTIC VALVE LVOT Vmax:   75.40 cm/s LVOT Vmean:  45.550 cm/s LVOT VTI:    0.122 m AI PHT:      412 msec  AORTA Ao Root diam: 2.70 cm Ao Asc diam:  2.50 cm  MITRAL VALVE                 TRICUSPID VALVE MV Area (PHT): 11.16 cm    TR Peak grad:   8.3 mmHg MV Area VTI:   1.08 cm     TR Vmax:        144.00 cm/s MV Peak grad:  13.2 mmHg    Estimated RAP:  3.00 mmHg MV Mean grad:  4.7 mmHg     RVSP:           11.3 mmHg MV Vmax:       1.82 m/s MV Vmean:      95.5 cm/s    SHUNTS MV Decel Time: 68 msec      Systemic VTI:  0.12 m MR Peak grad: 82.4 mmHg     Systemic Diam: 2.00 cm MR Mean grad: 53.0 mmHg MR Vmax:      454.00 cm/s MR Vmean:     345.0 cm/s MV E velocity: 99.00 cm/s MV A velocity: 172.00 cm/s MV E/A ratio:  0.58  Annabella Scarce MD Electronically signed by Annabella Scarce MD Signature Date/Time: 03/09/2024/3:29:56 PM    Final   TEE  ECHO TEE 04/28/2024  Narrative TRANSESOPHOGEAL ECHO REPORT    Patient Name:   Allison Leonard Date of Exam: 04/28/2024 Medical Rec #:  [id number]               Height:       62.0 in Accession #:    [id number]              Weight:       115.0 lb Date of Birth:  1976/04/04               BSA:  1.511 m Patient Age:    47 years                BP:           130/70 mmHg Patient Gender: F                       HR:           94 bpm. Exam Location:  Outpatient  Procedure: Cardiac Doppler, Color Doppler, Transesophageal Echo and 3D Echo (Both Spectral and Color Flow Doppler were utilized during procedure).  Indications:     Rheumatic Aortic Valve disease  History:         Patient has prior history of Echocardiogram examinations, most recent 03/09/2024.  Sonographer:     Tinnie Gosling RDCS Referring Phys:  [phone] DAMIEN BROCKS Sherilee Smotherman Diagnosing Phys: Soyla Merck MD  PROCEDURE: After discussion of the risks and benefits of a TEE, an informed consent was obtained from the patient. The patient was intubated. The transesophogeal probe was passed without difficulty through the esophogus of the patient. Imaged were obtained with the patient in a left lateral decubitus position. Sedation performed by different physician.  Image quality was good. The patient's vital signs; including heart rate, blood pressure, and oxygen saturation; remained stable throughout the procedure. The patient developed no complications during the procedure.  IMPRESSIONS   1. Left ventricular ejection fraction, by estimation, is 25%. The left ventricle has severely decreased function. The left ventricle demonstrates global hypokinesis. The left ventricular internal cavity size was moderately dilated. 2. Right ventricular systolic function is mildly reduced. The right ventricular size is normal. 3. Left atrial size was moderately dilated. No left atrial/left atrial appendage thrombus was detected. 4. The mitral valve is rheumatic. Moderate-severe mitral valve regurgitation, in some pulmonary vein views there is a possible systolic reversal. Moderate mitral stenosis. The mean mitral valve gradient is 6.0 mmHg with average heart rate of 69 bpm. MVA PISA 1.2 cm2. 5. The aortic valve is tricuspid. There is mild thickening of the aortic valve. Aortic valve regurgitation is moderate to severe. No aortic stenosis is present. Aortic regurgitation PHT measures 354 msec, AR vena contracta 0.5 cm. Holodiastolic reversals with reversal TVI challenging to obtain but appears to be 9 cm. 6. Agitated saline contrast bubble study was negative, with no evidence of any interatrial shunt. 7. 3D performed of the mitral valve and 3D performed of the aortic valve and demonstrates mitral valve is rheumatic, and aortic valve is mildly thickened with central AI.  FINDINGS Left Ventricle: Left ventricular ejection fraction, by estimation, is 25%. The left ventricle has severely decreased function. The left ventricle demonstrates global hypokinesis. The left ventricular internal cavity size was moderately dilated.  Right Ventricle: The right ventricular size is normal. No increase in right ventricular wall thickness. Right ventricular systolic function is mildly  reduced.  Left Atrium: Left atrial size was moderately dilated. No left atrial/left atrial appendage thrombus was detected.  Right Atrium: Right atrial size was normal in size.  Pericardium: Trivial pericardial effusion is present.  Mitral Valve: The mitral valve is rheumatic. Moderate to severe mitral valve regurgitation. Moderate mitral valve stenosis. MV peak gradient, 10.8 mmHg. The mean mitral valve gradient is 6.0 mmHg with average heart rate of 69 bpm.  Tricuspid Valve: The tricuspid valve is grossly normal. Tricuspid valve regurgitation is mild.  Aortic Valve: AV vena contracta 0.5 cm. Central AI. The aortic valve is tricuspid. There is mild thickening  of the aortic valve. Aortic valve regurgitation is moderate to severe. Aortic regurgitation PHT measures 354 msec. No aortic stenosis is present. Aortic valve mean gradient measures 3.0 mmHg. Aortic valve peak gradient measures 7.6 mmHg. Aortic valve area, by VTI measures 1.74 cm.  Pulmonic Valve: The pulmonic valve was grossly normal. Pulmonic valve regurgitation is trivial.  Aorta: The aortic root and ascending aorta are structurally normal, with no evidence of dilitation.  IAS/Shunts: No atrial level shunt detected by color flow Doppler. Agitated saline contrast was given intravenously to evaluate for intracardiac shunting. Agitated saline contrast bubble study was negative, with no evidence of any interatrial shunt.  Additional Comments: 3D was performed not requiring image post processing on an independent workstation and was abnormal. Spectral Doppler performed.  LEFT VENTRICLE PLAX 2D LVOT diam:     2.10 cm LV SV:         39 LV SV Index:   26 LVOT Area:     3.46 cm   AORTIC VALVE AV Area (Vmax):    1.85 cm AV Area (Vmean):   1.85 cm AV Area (VTI):     1.74 cm AV Vmax:           138.00 cm/s AV Vmean:          80.900 cm/s AV VTI:            0.224 m AV Peak Grad:      7.6 mmHg AV Mean Grad:      3.0 mmHg LVOT  Vmax:         73.73 cm/s LVOT Vmean:        43.210 cm/s LVOT VTI:          0.113 m LVOT/AV VTI ratio: 0.50 AI PHT:            354 msec  AORTA Ao Root diam: 2.80 cm Ao Asc diam:  2.40 cm  MITRAL VALVE MV Peak grad: 10.8 mmHg  SHUNTS MV Mean grad: 6.0 mmHg   Systemic VTI:  0.11 m MV Vmax:      1.64 m/s   Systemic Diam: 2.10 cm MV Vmean:     117.0 cm/s MV PHT:       94.96 msec  Soyla Merck MD Electronically signed by Soyla Merck MD Signature Date/Time: 04/29/2024/12:30:39 PM    Final (Updated)    CT SCANS  CT CORONARY MORPH W/CTA COR W/SCORE 12/27/2022  Addendum 12/30/2022 11:12 AM ADDENDUM REPORT: 12/30/2022 11:09  EXAM: OVER-READ INTERPRETATION  CT CHEST  The following report is an over-read performed by radiologist Dr. Andrea Lurie [facility] on 12/30/2022. This over-read does not include interpretation of cardiac or coronary anatomy or pathology. The coronary CTA interpretation by the cardiologist is attached.  COMPARISON:  Chest CTA 01/04/2022  FINDINGS: Vascular: No aortic atherosclerosis. The included aorta is normal in caliber. No pulmonary embolus where included on this exam not tailored to pulmonary artery assessment.  Mediastinum/nodes: No adenopathy or mass. Unremarkable esophagus.  Lungs: Subsegmental linear atelectasis or scarring within the right middle lobe and lingula. No pulmonary nodule. No pleural fluid. The included airways are patent.  Upper abdomen: No acute or unexpected findings. Incidental punctate granuloma in the right lobe of the liver.  Musculoskeletal: There are no acute or suspicious osseous abnormalities. Mild pectus excavatum deformity.  IMPRESSION: 1. Minor subsegmental atelectasis or scarring in the right middle lobe and lingula. 2. Mild pectus excavatum deformity.   Electronically Signed By: Andrea Gasman M.D. On: 12/30/2022  11:09  Narrative CLINICAL DATA:  Chest  pain  EXAM: Cardiac/Coronary CTA  TECHNIQUE: A non-contrast, gated CT scan was obtained with axial slices of 3 mm through the heart for calcium scoring. Calcium scoring was performed using the Agatston method. A 70 kV prospective, gated, contrast cardiac scan was obtained. Gantry rotation speed was 250 msecs and collimation was 0.6 mm. Two sublingual nitroglycerin  tablets (0.8 mg) were given. The 3D data set was reconstructed in 5% intervals of the 35-75% of the R-R cycle. Diastolic phases were analyzed on a dedicated workstation using MPR, MIP, and VRT modes. The patient received 95 cc of contrast.  FINDINGS: Image quality: Excellent.  Noise artifact is: Limited.  Coronary Arteries:  Normal coronary origin.  Right dominance.  Left main: The left main is a large caliber vessel with a normal take off from the left coronary cusp that bifurcates to form a left anterior descending artery and a left circumflex artery. trifurcates into a LAD, LCX, and ramus intermedius. There is no plaque or stenosis.  Left anterior descending artery: The LAD is patent without evidence of plaque or stenosis. The LAD gives off 1 patent diagonal branch.  Left circumflex artery: The LCX is non-dominant and patent with no evidence of plaque or stenosis. The LCX gives off 1 patent obtuse marginal branch.  Right coronary artery: The RCA is dominant with normal take off from the right coronary cusp. There is no evidence of plaque or stenosis. The RCA terminates as a PDA without evidence of plaque or stenosis.  Right Atrium: Right atrial size is within normal limits.  Right Ventricle: The right ventricular cavity is within normal limits.  Left Atrium: Left atrial size is normal in size with no left atrial appendage filling defect.  Left Ventricle: The ventricular cavity size is within normal limits. Small apical diverticulum.  Pulmonary arteries: Normal in size without proximal filling  defect.  Pulmonary veins: Normal pulmonary venous drainage.  Pericardium: Normal thickness without significant effusion or calcium present.  Cardiac valves: The aortic valve is trileaflet without significant calcification. The mitral valve is normal without significant calcification.  Aorta: Normal caliber without significant disease.  Extra-cardiac findings: See attached radiology report for non-cardiac structures.  IMPRESSION: 1. Coronary calcium score of 0.  2. Normal coronary origin with right dominance.  3. Normal coronary arteries.  RECOMMENDATIONS: 1. No evidence of CAD (0%). Consider non-atherosclerotic causes of chest pain.  Darryle Decent, MD  Electronically Signed: By: Darryle Decent M.D. On: 12/27/2022 21:55     ______________________________________________________________________________________________     Recent Labs: 11/18/2023: ALT 19 04/24/2024: BUN 15; Creatinine, Ser 0.61; Hemoglobin 13.5; Platelets 204; Potassium 4.7; Sodium 140  Recent Lipid Panel No results found for: CHOL, TRIG, HDL, CHOLHDL, VLDL, LDLCALC, LDLDIRECT  History of Present Illness     48 year old female with the above past medical history including chronic systolic heart failure, rheumatic valvular heart disease (aortic valve regurgitation, mitral valve stenosis, mitral valve regurgitation), right ICA aneurysm s/p embolization, hypertension, cyclic vomiting syndrome, and migraines.   She has a history of rheumatic fever with subsequent valvular heart disease.  Echocardiogram in 01/2022 showed EF 40 to 45%.  Echocardiogram in 2/24 showed EF improved to 50 to 55%.  She has a history of R ICA aneurysm s/p embolization in 11/2021. Coronary CTA in 2/24 revealed coronary calcium score of 0, normal coronary arteries.  She was last seen in the office on 03/29/2023 and was stable from a cardiac standpoint.  Repeat echocardiogram in 03/2024  showed EF 25 to 30%, severely decreased LV  function, LV global hypokinesis, G1 DD, elevated LVEDP, normal RV systolic function, mild rheumatic mitral valve regurgitation, moderate aortic valve regurgitation (per Dr. Rosslyn review, echo showed moderate to severe aortic regurgitation, rheumatic mitral valve stenosis, MR).  It was noted that her valvular heart disease was likely contributing to her decreased EF.  She was not considered a candidate for cardiac MRI in the setting of internal carotid artery embolization. TEE was recommended with escalation of GDMT.  She was last seen in the office on 04/07/2024 and was stable from a cardiac standpoint.  She was started on losartan  25 mg daily, metoprolol  25 mg daily.  She underwent TEE on 04/28/2024 which revealed EF 25%, severely decreased LV function, LV global hypokinesis, mildly reduced RV systolic function, rheumatic mitral valve with moderate to severe mitral valve regurgitation, moderate mitral stenosis, moderate to severe aortic valve regurgitation, no aortic stenosis.  Escalation of GDMT was recommended as well as referral to CT surgery.    She presents today for follow-up.  Since her last visit she has   Discussed results of TEE. Severe AI. Moderate MS/severe MR. Rheumatic. CCTA showed normal coronaries last year. She is SOB but not LE edema. We discussed she will need surgical repair. I will have her see Damien on 7/24 and get her on entresto/jardiance . She will then see me in early August and we will refer to her to surgery for consideration of AVR/MVR. She was in agreement with this plan.   Stable from a cardiac standpoint.  She notes increased dyspnea on exertion, intermittent lightheadedness, dizziness, near syncope.  BP has been low.  In an effort to maximize GDMT, will decrease metoprolol  to 12.5 mg daily, will decrease losartan  to 12.5 mg daily.  Will add Jardiance  10 mg daily.  I do not think her BP will tolerate Entresto at this time.  I will verify medication changes with Dr. Barbaraann.  Will  update BMET in 2 weeks.  Will refer to CT surgery for consideration of AVR/MVR.  Follow-up as scheduled with Dr. Barbaraann in 06/2024. 1. Chronic systolic heart failure: Most recent echo in 03/2024 showed EF 25 to 30%, severely decreased LV function, LV global hypokinesis, G1 DD, elevated LVEDP, normal RV systolic function, mild rheumatic mitral valve regurgitation, moderate aortic valve regurgitation (per Dr. Rosslyn review, echo showed moderate to severe aortic regurgitation, rheumatic mitral valve stenosis, MR).  It was noted that her valvular heart disease was likely contributing to her decreased EF. Not a candidate for cardiac MRI in the setting of internal carotid artery embolization. She does report mild shortness of breath with activity, generally euvolemic and well compensated on exam.  GDMT somewhat limited in the setting of borderline BP.  Will trial metoprolol  succinate 25 mg daily.  Will also trial losartan  25 mg daily.  I do not think BP will tolerate Entresto at this time. Continue to monitor BP and report SBP consistently less than 100 mmHg, HR consistently less than 55 bpm.  Will update BMET, CBC with repeat BMET in 2 weeks in the setting of new losartan . Consider escalation of GDMT as able.     2. Valvular heart disease: Most recent echocardiogram concerning for moderate to severe aortic valve regurgitation, rheumatic mitral valve stenosis, MR.  She reports shortness of breath with activity, euvolemic and well compensated on exam.  Follow-up TEE was recommended.  Through shared decision making, will pursue TEE.  Will update CBC, BMET prior to  procedure.  Will initiate GDMT as above.  Further recommendations pending TEE.     3. R ICA aneurysm:  S/p embolization in 11/2021.  Stable.  Continue Plavix .   4. Hypertension: BP well controlled.  Continue to monitor BP as above with initiation of GDMT.     5. Cyclic vomiting syndrome: She takes daily Phenergan .  QT prolongation noted  on EKG.  However,  she states Phenergan  is the only medication that controls her symptoms.  Continue to monitor. Following with GI.     6. Disposition: Follow-up in 1 month with APP, follow-up as scheduled with Dr. Barbaraann   Home Medications    Current Outpatient Medications  Medication Sig Dispense Refill   clopidogrel  (PLAVIX ) 75 MG tablet Take 0.5 tablets (37.5 mg total) by mouth daily. 90 tablet 0   dicyclomine  (BENTYL ) 20 MG tablet TAKE 1 TABLET (20 MG TOTAL) BY MOUTH 3 (THREE) TIMES DAILY BEFORE MEALS. (Patient taking differently: Take 20 mg by mouth daily.) 90 tablet 2   famotidine  (PEPCID ) 20 MG tablet Take 1 tablet (20 mg total) by mouth 2 (two) times daily. 180 tablet 0   losartan  (COZAAR ) 25 MG tablet Take 1 tablet (25 mg total) by mouth daily. 90 tablet 3   lubiprostone  (AMITIZA ) 24 MCG capsule Take 1 capsule (24 mcg total) by mouth 2 (two) times daily with a meal. (Patient taking differently: Take 24 mcg by mouth 2 (two) times daily as needed for constipation.) 60 capsule 2   metoprolol  succinate (TOPROL  XL) 25 MG 24 hr tablet Take 1 tablet (25 mg total) by mouth daily. 90 tablet 3   NURTEC 75 MG TBDP DISSOLVE 1 TABLET BY MOUTH AT EARLIEST ONSET OF MIGRAINE AS NEEDED. MAX 1 TAB PER 24 HOURS 16 tablet 5   predniSONE  (STERAPRED UNI-PAK 21 TAB) 10 MG (21) TBPK tablet take 60mg  day 1, then 50mg  day 2, then 40mg  day 3, then 30mg  day 4, then 20mg  day 5, then 10mg  day 6, then STOP 21 tablet 0   promethazine  (PHENERGAN ) 25 MG tablet TAKE 1 TABLET BY MOUTH EVERY 6 HOURS AS NEEDED FOR NAUSEA OR VOMITING. 28 tablet 0   tiZANidine  (ZANAFLEX ) 4 MG tablet Take 1 tablet (4 mg total) by mouth 3 (three) times daily as needed for muscle spasms. 60 tablet 0   Current Facility-Administered Medications  Medication Dose Route Frequency Provider Last Rate Last Admin   0.9 %  sodium chloride  infusion  500 mL Intravenous Once Stacia Glendia BRAVO, MD         Review of Systems    ***.  All other systems reviewed and are  otherwise negative except as noted above.    Physical Exam    VS:  There were no vitals taken for this visit. , BMI There is no height or weight on file to calculate BMI.     GEN: Well nourished, well developed, in no acute distress. HEENT: normal. Neck: Supple, no JVD, carotid bruits, or masses. Cardiac: RRR, no murmurs, rubs, or gallops. No clubbing, cyanosis, edema.  Radials/DP/PT 2+ and equal bilaterally.  Respiratory:  Respirations regular and unlabored, clear to auscultation bilaterally. GI: Soft, nontender, nondistended, BS + x 4. MS: no deformity or atrophy. Skin: warm and dry, no rash. Neuro:  Strength and sensation are intact. Psych: Normal affect.  Accessory Clinical Findings    ECG personally reviewed by me today -    - no acute changes.   Lab Results  Component Value Date  WBC 8.6 04/24/2024   HGB 13.5 04/24/2024   HCT 43.8 04/24/2024   MCV 87 04/24/2024   PLT 204 04/24/2024   Lab Results  Component Value Date   CREATININE 0.61 04/24/2024   BUN 15 04/24/2024   NA 140 04/24/2024   K 4.7 04/24/2024   CL 98 04/24/2024   CO2 22 04/24/2024   Lab Results  Component Value Date   ALT 19 11/18/2023   AST 27 11/18/2023   ALKPHOS 72 11/18/2023   BILITOT 0.8 11/18/2023   No results found for: CHOL, HDL, LDLCALC, LDLDIRECT, TRIG, CHOLHDL  No results found for: HGBA1C  Assessment & Plan    1.  ***  No BP recorded.  {Refresh Note OR Click here to enter BP  :1}***   Damien JAYSON Braver, NP 05/25/2024, 12:50 PM

## 2024-05-25 NOTE — Patient Instructions (Signed)
 Medication Instructions:  Decrease Losartan  12.5 mg  daily Decrease Metoprolol  12.5 mg daily Start Jardiance  10 mg daily  *If you need a refill on your cardiac medications before your next appointment, please call your pharmacy*  Lab Work: BMET 2 weeks  Testing/Procedures: NONE ordered at this time of appointment    Follow-Up: At [facility], you and your health needs are our priority.  As part of our continuing mission to provide you with exceptional heart care, our providers are all part of one team.  This team includes your primary Cardiologist (physician) and Advanced Practice Providers or APPs (Physician Assistants and Nurse Practitioners) who all work together to provide you with the care you need, when you need it.  Your next appointment:    Keep follow up with Dr. Barbaraann  Provider:   Darryle ONEIDA Barbaraann, MD    We recommend signing up for the patient portal called MyChart.  Sign up information is provided on this After Visit Summary.  MyChart is used to connect with patients for Virtual Visits (Telemedicine).  Patients are able to view lab/test results, encounter notes, upcoming appointments, etc.  Non-urgent messages can be sent to your provider as well.   To learn more about what you can do with MyChart, go to [email or web address].   Other Instructions

## 2024-05-26 ENCOUNTER — Encounter: Payer: Self-pay | Admitting: Nurse Practitioner

## 2024-06-01 DIAGNOSIS — I051 Rheumatic mitral insufficiency: Secondary | ICD-10-CM | POA: Diagnosis not present

## 2024-06-01 DIAGNOSIS — I671 Cerebral aneurysm, nonruptured: Secondary | ICD-10-CM | POA: Diagnosis not present

## 2024-06-01 DIAGNOSIS — I061 Rheumatic aortic insufficiency: Secondary | ICD-10-CM | POA: Diagnosis not present

## 2024-06-01 DIAGNOSIS — I10 Essential (primary) hypertension: Secondary | ICD-10-CM | POA: Diagnosis not present

## 2024-06-01 DIAGNOSIS — I5022 Chronic systolic (congestive) heart failure: Secondary | ICD-10-CM | POA: Diagnosis not present

## 2024-06-02 ENCOUNTER — Other Ambulatory Visit: Payer: Self-pay

## 2024-06-02 DIAGNOSIS — K5909 Other constipation: Secondary | ICD-10-CM

## 2024-06-02 DIAGNOSIS — R1013 Epigastric pain: Secondary | ICD-10-CM

## 2024-06-02 LAB — BASIC METABOLIC PANEL WITH GFR
BUN/Creatinine Ratio: 16 (ref 9–23)
BUN: 15 mg/dL (ref 6–24)
CO2: 23 mmol/L (ref 20–29)
Calcium: 9.7 mg/dL (ref 8.7–10.2)
Chloride: 102 mmol/L (ref 96–106)
Creatinine, Ser: 0.95 mg/dL (ref 0.57–1.00)
Glucose: 106 mg/dL — ABNORMAL HIGH (ref 70–99)
Potassium: 4.2 mmol/L (ref 3.5–5.2)
Sodium: 139 mmol/L (ref 134–144)
eGFR: 74 mL/min/1.73 (ref >59)

## 2024-06-02 MED ORDER — FAMOTIDINE 20 MG PO TABS
20.0000 mg | ORAL_TABLET | Freq: Two times a day (BID) | ORAL | 0 refills | Status: DC
Start: 2024-06-02 — End: 2024-09-01

## 2024-06-03 ENCOUNTER — Ambulatory Visit: Payer: Self-pay | Admitting: Nurse Practitioner

## 2024-06-05 ENCOUNTER — Encounter: Payer: Self-pay | Admitting: Cardiovascular Disease

## 2024-06-12 ENCOUNTER — Telehealth: Payer: Self-pay

## 2024-06-12 NOTE — Telephone Encounter (Signed)
 Called patient to schedule physical. LVM to call back.   Thanks!

## 2024-06-17 NOTE — Progress Notes (Signed)
 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  Due to her low weight, would likely avoid Qulipta  as it may suppress appetite.   Migraine rescue: Nurtec Tizanidine  for neck pain Refer to Dr. Lester of neurosurgery for further evaluation and recommendations regarding aneurysm Keep headache diary Follow up 8 months.     Subjective:  Allison Leonard is a 48 year old female with chronic systolic heart failure, rheumatic valvular disease (aortic valve regurgitation, mitral valve regurgitation, mitral valve stenosis), Celiac disease, Cyclic Vomiting Syndrome and right ICA aneurysm s/p embolization who follows up for migraines.   UPDATE: Due to wearing off with Aimovig , plan was to switch to Qulipta .  However, her insurance required her to try another injection but we weren't able to contact her with this.  She also started having neck pain which was aggravating her headaches and was started on tizanidine .    She has both mild-moderate headaches and severe headaches. Duration:  mild-moderate headaches last 1 days with Tylenol ; severe headaches last 30 minutes with Nurtec Frequency:  mild-moderate headaches occur every 1 to 2 days; severe headaches occur 3 to 4 times a month.   Repeat MRA head on 04/20/2024 personally reviewed again revealed stable flow diverting stent in the distal right siphon and supraclinoid ICA with no sign of residual or recurrent aneurysmal flow.  Dr. Dolphus has retired and she needs to find another interventionist.    Recent TEE revealed EF 25% with moderate to severe mitral valve regurgitation, moderate mitral  stenosis, and moderate to severe aortic valve regurgitation.  She is scheduled to have AVR/MVR.    Frequency:  13 headaches in last 30 days Current NSAIDS:  none Current analgesics:  none Current triptans:  contraindicated - cerebral aneurysms, history of cerebral venous thrombosis Current ergotamine:  none Current anti-emetic:  promethazine  25mg  Current muscle relaxants:  tizanidine  4mg  TID PRN Current sleep aide:  none Current Antihypertensive medications:  metoprolol  succinate XL 12.5mg  daily, losartan  12.5mg  daily Current Antidepressant medications:  none Current Anticonvulsant medications: none Current anti-CGRP:  none Current Vitamins/Herbal/Supplements:  none Current Antihistamines/Decongestants:  none Other therapy:  none    Caffeine :  Coffee 1 to 2 times a month.  No soda Exercise:  no Depression:  no; Anxiety:  no Other pain:  Sometimes abdominal discomfort Sleep hygiene:  Restless   HISTORY:  Started having headaches at age 73.  Worse over the past 3 to 4 years.  They are mostly severe sharp or pounding headache in her right temple.  Usually lasts a day.  Sometimes they wake her up at night.  17 days over last 30 days.  Nausea, vomiting, photophobia, phonophobia, blurred vision.  No numbness or weakness.  No specific triggers.  Vomiting may help make it better.  MRI brain with and without contrast on 06/03/2020 personally reviewed showed no acute intracranial abnormality but did show chronic nonocclusive thrombus in the superior sagittal sinus; 1-3 mm outpouchings involving the right cavernous ICA, left ICA terminus, proximal basilar artery and right P1 segment; and mild to moderate bilateral A1 segment narrowing.  Follow up CTA of head on 06/20/2020 personally reviewed redemonstrated 1-2 mm distal cavernous/paraclinoid right ICA aneurysm and 2 mm proximal basilar artery aneurysm but previously seen  small aneurysms of the right P1 PCA and left ICA terminus not appreciated.  She  was referred to interventional radiology at that time but she never received a call.  Hypercoagulable panel including Factor V Leiden gene mutation, from June and August 2021 was negative.  Repeat CTA head performed 08/02/2021 personally reviewed again demonstrated 1-2 mm right paraclinoid ICA aneurysm vs infundibulum and 2 mm proximal basilar artery aneurysm vs fenestration, unchanged from prior study.  She was referred to endovascular interventional radiology.  On 11/27/2021, she underwent embolization with pipeline shield flow diverter device for the right ICA aneurysm.  She was discharged but she had a syncopal episode where she developed a migraine afterwards, prompting her to go to the ED.  CTA head and neck personally reviewed revealed no postembolization complications.  She was treated with a headache cocktail.  She had been experiencing sinus tachycardia.  Echo in February 2024 revealed EF 40-45%.  Referred to  cardiology for possible heart failure, who reviewed echo and believed the EF was actually within normal range of 50-55%.      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    Past NSAIDS:  ibuprofen Past analgesics:  Fioricet, Excedrin  Past abortive triptans:  sumatriptan  100mg  Past abortive ergotamine:  none Past muscle relaxants:  none Past anti-emetic:  Compazine , Reglan  10mg ; promethazine  12.5mg , Zofran  (allergy) Past antihypertensive medications:  none Past antidepressant medications:  nortriptyline  Past anticonvulsant medications:  topiramate  Past anti-CGRP:  Ubrelvy , Nurtec PRN (effective), Aimovig  (wearing off) Past vitamins/Herbal/Supplements:  none Past antihistamines/decongestants:  Benadryl  Other past therapies:  none   Family history of headache:  Brother, sister,  mother, daughter  PAST MEDICAL HISTORY: Past Medical History:  Diagnosis Date   Acid reflux 07/04/2018   Anemia    Aneurysm of unspecified site [facility]) 2023   Brain aneurysm 2022   Cannabinoid hyperemesis syndrome 10/27/2018   Celiac disease/sprue    Cyclic vomiting syndrome    Dyspnea    in past per patient 11/24/21   Generalized anxiety disorder 07/04/2018   History of colon polyps    last colonoscopy 2018 removed multiple benign polyps   Migraines 07/04/2018    MEDICATIONS: Current Outpatient Medications on File Prior to Visit  Medication Sig Dispense Refill   dicyclomine  (BENTYL ) 20 MG tablet TAKE 1 TABLET (20 MG TOTAL) BY MOUTH 3 (THREE) TIMES DAILY BEFORE MEALS. 90 tablet 2   empagliflozin  (JARDIANCE ) 10 MG TABS tablet Take 1 tablet (10 mg total) by mouth daily before breakfast. 90 tablet 3   famotidine  (PEPCID ) 20 MG tablet Take 1 tablet (20 mg total) by mouth 2 (two) times daily. 180 tablet 0   losartan  (COZAAR ) 25 MG tablet Take 1 tablet (25 mg total) by mouth daily. 90 tablet 3   lubiprostone  (AMITIZA ) 24 MCG capsule Take 1 capsule (24 mcg total) by mouth 2 (two) times daily with a meal. 60 capsule 2   metoprolol  succinate (TOPROL  XL) 25 MG 24 hr tablet Take 1 tablet (25 mg total) by mouth daily. 90 tablet 3   promethazine  (PHENERGAN ) 25 MG tablet TAKE 1 TABLET BY MOUTH EVERY 6 HOURS AS NEEDED FOR NAUSEA OR VOMITING. 28 tablet 0   tiZANidine  (ZANAFLEX ) 4 MG tablet Take 1 tablet (4 mg total) by mouth 3 (three) times daily as needed for muscle  spasms. 60 tablet 0   Current Facility-Administered Medications on File Prior to Visit  Medication Dose Route Frequency Provider Last Rate Last Admin   0.9 %  sodium chloride  infusion  500 mL Intravenous Once Stacia Glendia BRAVO, MD         ALLERGIES: Allergies  Allergen Reactions   Zofran  [Ondansetron  Hcl] Anaphylaxis   Egg-Derived Products Other (See Comments)    Severe stomach cramps   Morphine  And Codeine Itching   Zofran   [Ondansetron ]     FAMILY HISTORY: Family History  Problem Relation Age of Onset   Heart disease Father    Diabetes Father    Hypertension Father    Hyperlipidemia Father    Colon polyps Father    Colon cancer Sister    Cervical cancer Maternal Aunt    Stomach cancer Maternal Aunt    Throat cancer Maternal Uncle    Stroke Maternal Uncle    Heart attack Maternal Grandmother    Heart attack Paternal Grandmother    Down syndrome Son    Esophageal cancer Neg Hx    Rectal cancer Neg Hx       Objective:  Blood pressure (!) 144/85, pulse (!) 103, height 5' 2 (1.575 m), weight 101 lb (45.8 kg), SpO2 98%. General: No acute distress.  Patient appears well-groomed.   Head:  Normocephalic/atraumatic Neck:  Supple.  No paraspinal tenderness.  Full range of motion. Heart:  Regular rate and rhythm. Neuro:  Alert and oriented.  Speech fluent and not dysarthric.  Language intact.  CN II-XII intact.  Bulk and tone normal.  Muscle strength 5/5 throughout.  Sensation to light touch intact.  Deep tendon reflexes 2+ throughout, toes downgoing.  Gait normal.  Romberg negative.    Juliene Dunnings, DO  CC: Raguel Lee, DO

## 2024-06-18 ENCOUNTER — Ambulatory Visit: Admitting: Neurology

## 2024-06-18 ENCOUNTER — Encounter: Payer: Self-pay | Admitting: Neurology

## 2024-06-18 VITALS — BP 144/85 | HR 103 | Ht 62.0 in | Wt 101.0 lb

## 2024-06-18 DIAGNOSIS — G43009 Migraine without aura, not intractable, without status migrainosus: Secondary | ICD-10-CM | POA: Diagnosis not present

## 2024-06-18 DIAGNOSIS — I671 Cerebral aneurysm, nonruptured: Secondary | ICD-10-CM

## 2024-06-18 MED ORDER — AJOVY 225 MG/1.5ML ~~LOC~~ SOAJ
225.0000 mg | SUBCUTANEOUS | 11 refills | Status: AC
Start: 2024-06-18 — End: ?

## 2024-06-18 MED ORDER — NURTEC 75 MG PO TBDP: 1.0000 | ORAL_TABLET | Freq: Every day | ORAL | 11 refills | Status: DC | PRN

## 2024-06-18 NOTE — Patient Instructions (Addendum)
 Start Ajovy  injection every 28 days.  If no improvement in 3 months, contact me Take Nurtec once daily as needed Refer to Dr. Nancyann Burns regarding aneurysm ([facility]) Follow up 8 months

## 2024-06-19 ENCOUNTER — Telehealth: Payer: Self-pay

## 2024-06-19 ENCOUNTER — Encounter: Admitting: Thoracic Surgery (Cardiothoracic Vascular Surgery)

## 2024-06-19 NOTE — Telephone Encounter (Signed)
*  [facility]  Pharmacy Patient Advocate Encounter   Received notification from Fax that prior authorization for Ajovy  is required/requested.   Insurance verification completed.   The patient is insured through [employer] [facility] .   Per test claim: PA required; PA started via CoverMyMeds. KEY [id number] . Please see clinical question(s) below that I am not finding the answer to in their chart and advise.   Is the patient utilizing prophylactic intervention modalities (for example, behavioral therapy, physical therapy, and life-style modification)?

## 2024-06-21 NOTE — Progress Notes (Unsigned)
 Cardiology Office Note:  .   Date:  06/24/2024  ID:  Allison Leonard Ellen, DOB 1976-10-30, MRN [id number] PCP: Lennie Raguel MATSU, DO  [facility] Providers Cardiologist:  Darryle ONEIDA Decent, MD {   History of Present Illness: .    Chief Complaint  Patient presents with   Heart failure with mid-range ejection fraction   Follow-up    Allison Leonard is a 48 y.o. female with history of CHF, AI/MR who presents for follow-up.    History of Present Illness   Allison Leonard is a 48 year old female with systolic heart failure and valvular heart disease who presents for evaluation for cardiac surgery. She has been referred to cardiac surgery for evaluation of her heart condition.  She has a history of systolic heart failure with an ejection fraction of 25-30%.  She experiences shortness of breath, particularly during activities such as walking around the grocery store, and notes increased fatigue. No leg swelling is reported, although her socks can feel tight if she stands for a long time. She experiences dizziness if she stands up quickly.  Her current medications include losartan  25 mg daily, metoprolol  succinate 25 mg daily, and Jardiance  10 mg daily. A CT scan performed last year showed no blockages.  Her family history is significant for her father having undergone open heart surgery and her mother having an enlarged heart. Her son, who has Down syndrome, has had three open heart surgeries.  Socially, she lives alone but has support from her daughter for recovery. She also takes care of her sister who has stage four cancer.          Problem List Systolic HF -01/18/2022 EF 40-45% -12/07/2022 EF 50-55% -03/09/2024 EF 25-30% -normal coronary arteries  2. 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 HTN 5. Migraine    ROS: All other ROS reviewed and negative. Pertinent positives noted in the  HPI.     Studies Reviewed: SABRA       TEE 04/29/2024  1. Left ventricular ejection fraction, by estimation, is 25%. The left  ventricle has severely decreased function. The left ventricle demonstrates  global hypokinesis. The left ventricular internal cavity size was  moderately dilated.   2. Right ventricular systolic function is mildly reduced. The right  ventricular size is normal.   3. Left atrial size was moderately dilated. No left atrial/left atrial  appendage thrombus was detected.   4. The mitral valve is rheumatic. Moderate-severe mitral valve  regurgitation, in some pulmonary vein views there is a possible systolic  reversal. Moderate mitral stenosis. The mean mitral valve gradient is 6.0  mmHg with average heart rate of 69 bpm. MVA   PISA 1.2 cm2.   5. The aortic valve is tricuspid. There is mild thickening of the aortic  valve. Aortic valve regurgitation is moderate to severe. No aortic  stenosis is present. Aortic regurgitation PHT measures 354 msec, AR vena  contracta 0.5 cm. Holodiastolic  reversals with reversal TVI challenging to obtain but appears to be 9 cm.   6. Agitated saline contrast bubble study was negative, with no evidence  of any interatrial shunt.   7. 3D performed of the mitral valve and 3D performed of the aortic valve  and demonstrates mitral valve is rheumatic, and aortic valve is mildly  thickened with central AI.   TTE 03/09/2024  1. Compared with the echo 12/2022, systolic function  has decreased from  50-55% to 25-30%. Left ventricular ejection fraction, by estimation, is 25  to 30%. The left ventricle has severely decreased function. The left  ventricle demonstrates global  hypokinesis. Left ventricular diastolic parameters are consistent with  Grade I diastolic dysfunction (impaired relaxation). Elevated left  ventricular end-diastolic pressure.   2. Right ventricular systolic function is normal. The right ventricular  size is normal. There is  normal pulmonary artery systolic pressure.   3. 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 No  evidence of mitral stenosis.   4. The aortic valve is tricuspid. Aortic valve regurgitation is moderate.  No aortic stenosis is present. Aortic regurgitation PHT measures 412 msec.   5. The inferior vena cava is normal in size with greater than 50%  respiratory variability, suggesting right atrial pressure of 3 mmHg.  Physical Exam:   VS:  BP 94/62 (BP Location: Left Arm, Patient Position: Sitting, Cuff Size: Normal)   Pulse 62   Resp 16   Ht 5' 2 (1.575 m)   Wt 102 lb (46.3 kg)   SpO2 95%   BMI 18.66 kg/m    Wt Readings from Last 3 Encounters:  06/24/24 102 lb (46.3 kg)  06/18/24 101 lb (45.8 kg)  05/25/24 110 lb (49.9 kg)    GEN: Well nourished, well developed in no acute distress NECK: No JVD; No carotid bruits CARDIAC: RRR, 2/6 diastolic murmur, rubs, gallops RESPIRATORY:  Clear to auscultation without rales, wheezing or rhonchi  ABDOMEN: Soft, non-tender, non-distended EXTREMITIES:  No edema; No deformity  ASSESSMENT AND PLAN: .   Assessment and Plan    Systolic heart failure with reduced ejection fraction secondary to rheumatic valvular heart disease (moderate to severe mitral regurgitation, moderate to severe aortic regurgitation, moderate mitral stenosis) Systolic heart failure with EF 25-30% due to rheumatic valvular disease. Rapid progression likely from valvular insufficiency. No coronary blockages on last year's CT. Surgical tolerance is a concern due to impaired cardiac function. Evaluations by Dr. Shyrl and Dr. Saberwall planned. Possible right and left heart catheterization. Referral to [facility] if not a surgical candidate locally. - Refer to Dr. Saberwall for evaluation and optimization before surgery. - Consider right and left heart catheterization to assess hemodynamics. - Consult with Dr. Shyrl for surgical evaluation and planning. - Continue  metoprolol  succinate 25 mg daily, losartan  25 mg daily, Jardiance  10 mg daily. BP will not allow further GDMT.   Hypertension Hypertension managed with current medications. Blood pressure 94/62, no room for further titration. No volume overload, stable weights. - Continue metoprolol  succinate 25 mg daily, losartan  25 mg daily, Jardiance  10 mg daily.              Follow-up: Return in about 6 months (around 12/25/2024).   Signed, Darryle DASEN. Barbaraann, MD, [facility]  [facility]  [address] [place], [place] [address] [phone]  7:40 PM

## 2024-06-22 NOTE — Telephone Encounter (Signed)
 Pharmacy Patient Advocate Encounter  Received notification from [facility] that Prior Authorization for AJOVY  (fremanezumab -vfrm) injection 225MG /1.5ML auto-injectors has been APPROVED from 06-22-2024 to 09-20-2024   PA #/Case ID/Reference #: [id number]

## 2024-06-24 ENCOUNTER — Ambulatory Visit: Attending: Cardiology | Admitting: Cardiovascular Disease

## 2024-06-24 ENCOUNTER — Encounter: Payer: Self-pay | Admitting: Cardiovascular Disease

## 2024-06-24 VITALS — BP 94/62 | HR 62 | Resp 16 | Ht 62.0 in | Wt 102.0 lb

## 2024-06-24 DIAGNOSIS — I051 Rheumatic mitral insufficiency: Secondary | ICD-10-CM | POA: Diagnosis not present

## 2024-06-24 DIAGNOSIS — I061 Rheumatic aortic insufficiency: Secondary | ICD-10-CM | POA: Insufficient documentation

## 2024-06-24 DIAGNOSIS — I5022 Chronic systolic (congestive) heart failure: Secondary | ICD-10-CM | POA: Insufficient documentation

## 2024-06-24 DIAGNOSIS — I10 Essential (primary) hypertension: Secondary | ICD-10-CM | POA: Insufficient documentation

## 2024-06-24 NOTE — Patient Instructions (Signed)
 Medication Instructions:  Your physician recommends that you continue on your current medications as directed. Please refer to the Current Medication list given to you today.  *If you need a refill on your cardiac medications before your next appointment, please call your pharmacy*   Follow-Up: At [facility], you and your health needs are our priority.  As part of our continuing mission to provide you with exceptional heart care, our providers are all part of one team.  This team includes your primary Cardiologist (physician) and Advanced Practice Providers or APPs (Physician Assistants and Nurse Practitioners) who all work together to provide you with the care you need, when you need it.  Your next appointment:   6 month(s)  Provider:   Darryle ONEIDA Decent, MD    We recommend signing up for the patient portal called MyChart.  Sign up information is provided on this After Visit Summary.  MyChart is used to connect with patients for Virtual Visits (Telemedicine).  Patients are able to view lab/test results, encounter notes, upcoming appointments, etc.  Non-urgent messages can be sent to your provider as well.   To learn more about what you can do with MyChart, go to [email or web address].

## 2024-06-30 ENCOUNTER — Encounter (HOSPITAL_COMMUNITY): Payer: Self-pay | Admitting: Cardiology

## 2024-06-30 ENCOUNTER — Other Ambulatory Visit (HOSPITAL_COMMUNITY): Payer: Self-pay

## 2024-06-30 ENCOUNTER — Ambulatory Visit (HOSPITAL_COMMUNITY)
Admission: RE | Admit: 2024-06-30 | Discharge: 2024-06-30 | Disposition: A | Discharge status: Home / Self Care | Source: Ambulatory Visit | Attending: Cardiology | Admitting: Cardiology

## 2024-06-30 VITALS — BP 118/70 | HR 93 | Ht 62.0 in | Wt 103.2 lb

## 2024-06-30 DIAGNOSIS — I050 Rheumatic mitral stenosis: Secondary | ICD-10-CM | POA: Diagnosis not present

## 2024-06-30 DIAGNOSIS — I061 Rheumatic aortic insufficiency: Secondary | ICD-10-CM | POA: Diagnosis not present

## 2024-06-30 DIAGNOSIS — I051 Rheumatic mitral insufficiency: Secondary | ICD-10-CM | POA: Diagnosis not present

## 2024-06-30 DIAGNOSIS — I5022 Chronic systolic (congestive) heart failure: Secondary | ICD-10-CM

## 2024-06-30 DIAGNOSIS — Z720 Tobacco use: Secondary | ICD-10-CM

## 2024-06-30 DIAGNOSIS — I671 Cerebral aneurysm, nonruptured: Secondary | ICD-10-CM

## 2024-06-30 LAB — CBC
HCT: 43.9 % (ref 36.0–46.0)
Hemoglobin: 13.6 g/dL (ref 12.0–15.0)
MCH: 26.6 pg (ref 26.0–34.0)
MCHC: 31 g/dL (ref 30.0–36.0)
MCV: 85.7 fL (ref 80.0–100.0)
Platelets: 171 K/uL (ref 150–400)
RBC: 5.12 MIL/uL — ABNORMAL HIGH (ref 3.87–5.11)
RDW: 14.5 % (ref 11.5–15.5)
WBC: 9 K/uL (ref 4.0–10.5)
nRBC: 0 % (ref 0.0–0.2)

## 2024-06-30 LAB — TSH: TSH: 1.603 u[IU]/mL (ref 0.350–4.500)

## 2024-06-30 LAB — BASIC METABOLIC PANEL WITH GFR
Anion gap: 11 (ref 5–15)
BUN: 14 mg/dL (ref 6–20)
CO2: 25 mmol/L (ref 22–32)
Calcium: 9.9 mg/dL (ref 8.9–10.3)
Chloride: 99 mmol/L (ref 98–111)
Creatinine, Ser: 0.63 mg/dL (ref 0.44–1.00)
GFR, Estimated: >60 mL/min (ref >60)
Glucose, Bld: 81 mg/dL (ref 70–99)
Potassium: 5.3 mmol/L — ABNORMAL HIGH (ref 3.5–5.1)
Sodium: 135 mmol/L (ref 135–145)

## 2024-06-30 LAB — BRAIN NATRIURETIC PEPTIDE: B Natriuretic Peptide: 84.7 pg/mL (ref 0.0–100.0)

## 2024-06-30 NOTE — Patient Instructions (Signed)
 Medication Changes:  No Changes In Medications at this time.   Lab Work:  Labs done today, your results will be available in MyChart, we will contact you for abnormal readings.  Special Instructions // Education:   [facility] [address] [place] [address] Dept: [phone] Loc: [phone]  Allison Leonard  06/30/2024  You are scheduled for a Cardiac Catheterization on Thursday, September 4 with Dr. Ria Commander.  1. Please arrive at the [facility] (Main Entrance A) at [facility]: [address] [place], [place] [address] at 5:30 AM (This time is 2 hour(s) before your procedure to ensure your preparation).   Free valet parking service is available. You will check in at ADMITTING. The support person will be asked to wait in the waiting room.  It is OK to have someone drop you off and come back when you are ready to be discharged.    Special note: Every effort is made to have your procedure done on time. Please understand that emergencies sometimes delay scheduled procedures.  2. Diet: No solid foods after midnight. You may have clear liquids until you arrive at the hospital.  List of approved liquids water, clear juice, clear tea, black coffee, fruit juices, non-citric and without pulp, carbonated beverages, Gatorade, Kool -Aid, plain Jello-O and plain ice popsicles.  4. Labs: TODAY   5. Medication instructions in preparation for your procedure:   Contrast Allergy: No  DO NOT TAKE JARDIANCE  THE MORNING OF PROCEDURE   6. Plan to go home the same day, you will only stay overnight if medically necessary. 7. Bring a current list of your medications and current insurance cards. 8. You MUST have a responsible person to drive you home. 9. Someone MUST be with you the first 24 hours after you arrive home or your discharge will be delayed. 10. Please wear clothes that are easy  to get on and off and wear slip-on shoes.  Thank you for allowing us  to care for you!   -- [facility]  Follow-Up in: 1 month as scheduled with Dr. Commander   At the [facility], you and your health needs are our priority. We have a designated team specialized in the treatment of Heart Failure. This Care Team includes your primary Heart Failure Specialized Cardiologist (physician), Advanced Practice Providers (APPs- Physician Assistants and Nurse Practitioners), and Pharmacist who all work together to provide you with the care you need, when you need it.   You may see any of the following providers on your designated Care Team at your next follow up:  Dr. Toribio Fuel Dr. Ezra Shuck Dr. Ria Commander Dr. Odis Brownie Greig Mosses, NP Caffie Shed, [place] [facility] [place], [place] Beckey Coe, NP [place] Lee, NP Tinnie Redman, PharmD   Please be sure to bring in all your medications bottles to every appointment.   Need to Contact Us :  If you have any questions or concerns before your next appointment please send us  a message through [place] or call our office at [phone].    TO LEAVE A MESSAGE FOR THE NURSE SELECT OPTION 2, PLEASE LEAVE A MESSAGE INCLUDING: YOUR NAME DATE OF BIRTH CALL BACK NUMBER REASON FOR CALL**this is important as we prioritize the call backs  YOU WILL RECEIVE A CALL BACK THE SAME DAY AS LONG AS YOU CALL BEFORE 4:00 PM

## 2024-06-30 NOTE — Progress Notes (Signed)
   ADVANCED HEART FAILURE CLINIC NOTE  Referring Physician: Shyrl Linnie KIDD, MD  Primary Care: Lennie Raguel MATSU, DO Primary Cardiologist: Heart Failure: Ria Commander, DO  CC: HFrEF, Severe MR/AI  HPI: Allison Leonard is a 48 y.o. female with severe MR, AI and HFrEF presenting today establish care.   Pertinent Family & social hx:  - Smokes 'a few' cigarettes daily.   Cardiac History:  - TTE in 3/23 with LVEF 50-55% and mild AI/MR. Repeat echo in 2/24 with LVEF 50% - Established care with Dr. Barbaraann in 2023 due to tachycardia / SOB.  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  - TEE on 04/28/24 w/ EF 25%, mod-severe MR, mod MS, mod-sev AI.  - Referred to Dr. Shyrl for surgery and subsequently to [facility] for advanced therapies evaluation.    Interval hx:  - From a symptomatic she has become very limited. She has to take frequent breaks with ADLs due to shortness of breath and fatigue. She has frequent episodes of orthopnea/PND. She can no longer sleep on her back due to dyspnea/chest pressure.  She has frequent lightheadedness.    PHYSICAL EXAM: Vitals:   06/30/24 1430  BP: 118/70  Pulse: 93  SpO2: 99%   Lungs- CTA CARDIAC:  JVP: 8 cm          Normal rate with regular rhythm. Systolic murmur.  Pulses 2+. no edema.  ABDOMEN: soft EXTREMITIES: Warm and well perfused.   DATA REVIEW  ECG: 04/07/24: NSR w/ PVCs, LVH  As per my personal interpretation  ECHO: 04/28/24: LVEF 25%, RV mildly reduced. 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 Mod-severe AI.   CATH: Pending    ASSESSMENT & PLAN:  Heart Failure with reduced fraction - Etiology & History: Likely valvular; mod-severe MR/AI 2/2 rheumatic changes. Unable to obtain CMR due to ICA embolization.  - NYHA Class: IIIB-IV - Volume status: Euvolemic to mildly hypervolemic - GDMT:  -  RAASi: losartan  25mg  daily Beta-blocker: toprol  25mg  daily -   Hydralazine/Nitrates: N/A -  SGLT2i: jardiance  10mg   - ICD: Evaluating for advanced therapies - Advanced therapies: Schedule RHC/LHC. [name] has NYHA III-IV symptoms. Discussed case with Dr. Shyrl who feels that she will be very high risk for x2 valve repair/replacement in the setting of biventricular failure. Will adjust meds after RHC. Barrier to transplant will be tobacco use. We discussed this extensively. For LVAD, she would require AVR. I am concerned about her MS also. Repair of both valves in the setting of mild RV dysfunction would make VAD implant high risk also. Will discuss with CTS colleagues.   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   I spent 60 minutes in the care of Allison Leonard today including charting, discussing advanced therapies, counseling on tobacco cessation, reviewing imaging with her, discussing RHC/LHC.    Allison Leonard Advanced Heart Failure Mechanical Circulatory Support

## 2024-06-30 NOTE — H&P (View-Only) (Signed)
   ADVANCED HEART FAILURE CLINIC NOTE  Referring Physician: Shyrl Linnie KIDD, MD  Primary Care: Lennie Raguel MATSU, DO Primary Cardiologist: Heart Failure: Ria Commander, DO  CC: HFrEF, Severe MR/AI  HPI: Allison Leonard is a 48 y.o. female with severe MR, AI and HFrEF presenting today establish care.   Pertinent Family & social hx:  - Smokes 'a few' cigarettes daily.   Cardiac History:  - TTE in 3/23 with LVEF 50-55% and mild AI/MR. Repeat echo in 2/24 with LVEF 50% - Established care with Dr. Barbaraann in 2023 due to tachycardia / SOB.  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  - TEE on 04/28/24 w/ EF 25%, mod-severe MR, mod MS, mod-sev AI.  - Referred to Dr. Shyrl for surgery and subsequently to [facility] for advanced therapies evaluation.    Interval hx:  - From a symptomatic she has become very limited. She has to take frequent breaks with ADLs due to shortness of breath and fatigue. She has frequent episodes of orthopnea/PND. She can no longer sleep on her back due to dyspnea/chest pressure.  She has frequent lightheadedness.    PHYSICAL EXAM: Vitals:   06/30/24 1430  BP: 118/70  Pulse: 93  SpO2: 99%   Lungs- CTA CARDIAC:  JVP: 8 cm          Normal rate with regular rhythm. Systolic murmur.  Pulses 2+. no edema.  ABDOMEN: soft EXTREMITIES: Warm and well perfused.   DATA REVIEW  ECG: 04/07/24: NSR w/ PVCs, LVH  As per my personal interpretation  ECHO: 04/28/24: LVEF 25%, RV mildly reduced. 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 Mod-severe AI.   CATH: Pending    ASSESSMENT & PLAN:  Heart Failure with reduced fraction - Etiology & History: Likely valvular; mod-severe MR/AI 2/2 rheumatic changes. Unable to obtain CMR due to ICA embolization.  - NYHA Class: IIIB-IV - Volume status: Euvolemic to mildly hypervolemic - GDMT:  -  RAASi: losartan  25mg  daily Beta-blocker: toprol  25mg  daily -   Hydralazine/Nitrates: N/A -  SGLT2i: jardiance  10mg   - ICD: Evaluating for advanced therapies - Advanced therapies: Schedule RHC/LHC. [name] has NYHA III-IV symptoms. Discussed case with Dr. Shyrl who feels that she will be very high risk for x2 valve repair/replacement in the setting of biventricular failure. Will adjust meds after RHC. Barrier to transplant will be tobacco use. We discussed this extensively. For LVAD, she would require AVR. I am concerned about her MS also. Repair of both valves in the setting of mild RV dysfunction would make VAD implant high risk also. Will discuss with CTS colleagues.   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   I spent 60 minutes in the care of Allison Leonard today including charting, discussing advanced therapies, counseling on tobacco cessation, reviewing imaging with her, discussing RHC/LHC.    [name] Advanced Heart Failure Mechanical Circulatory Support

## 2024-06-30 NOTE — Progress Notes (Signed)
 Orders placed for R/L Heart cath on 9/4 with Dr. Sabharwal

## 2024-07-01 ENCOUNTER — Encounter: Payer: Self-pay | Admitting: Neuroradiology

## 2024-07-01 ENCOUNTER — Ambulatory Visit: Admitting: Neuroradiology

## 2024-07-01 VITALS — BP 102/64 | HR 78 | Ht 62.0 in | Wt 105.0 lb

## 2024-07-01 DIAGNOSIS — Z95828 Presence of other vascular implants and grafts: Secondary | ICD-10-CM | POA: Diagnosis not present

## 2024-07-01 DIAGNOSIS — I671 Cerebral aneurysm, nonruptured: Secondary | ICD-10-CM

## 2024-07-01 NOTE — Progress Notes (Signed)
 I had the pleasure of seeing this nice lady in follow-up for a brain aneurysm.  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  This was treated with a pipeline device on November 27, 2021 by Dr. Monna.  The headaches have not resolved.  Her last MRA on 04/20/2024 shows no aneurysm.  She is on 325 mg aspirin  daily.  She apparently has 2 failing heart valves and is scheduled to undergo open valve repair in the near future.  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  Recommendation:  No further imaging surveillance needed.  From the standpoint of her aneurysm, the aspirin  can be reduced to 81 mg or even stopped anytime as necessary.  I have not scheduled any further follow-up but I am glad to see her again at any time as needed.

## 2024-07-03 ENCOUNTER — Ambulatory Visit: Admitting: Thoracic Surgery (Cardiothoracic Vascular Surgery)

## 2024-07-07 ENCOUNTER — Ambulatory Visit: Admitting: Neurology

## 2024-07-09 ENCOUNTER — Ambulatory Visit (HOSPITAL_COMMUNITY)
Admission: RE | Admit: 2024-07-09 | Discharge: 2024-07-09 | Disposition: A | Discharge status: Home / Self Care | Attending: Cardiology | Admitting: Cardiology

## 2024-07-09 ENCOUNTER — Encounter (HOSPITAL_COMMUNITY)
Admission: RE | Disposition: A | Discharge status: Home / Self Care | Payer: Self-pay | Source: Home / Self Care | Attending: Cardiology

## 2024-07-09 DIAGNOSIS — I080 Rheumatic disorders of both mitral and aortic valves: Secondary | ICD-10-CM | POA: Insufficient documentation

## 2024-07-09 DIAGNOSIS — I671 Cerebral aneurysm, nonruptured: Secondary | ICD-10-CM | POA: Diagnosis not present

## 2024-07-09 DIAGNOSIS — F17210 Nicotine dependence, cigarettes, uncomplicated: Secondary | ICD-10-CM | POA: Diagnosis not present

## 2024-07-09 DIAGNOSIS — I5022 Chronic systolic (congestive) heart failure: Secondary | ICD-10-CM

## 2024-07-09 DIAGNOSIS — I061 Rheumatic aortic insufficiency: Secondary | ICD-10-CM | POA: Diagnosis not present

## 2024-07-09 DIAGNOSIS — Z79899 Other long term (current) drug therapy: Secondary | ICD-10-CM | POA: Insufficient documentation

## 2024-07-09 DIAGNOSIS — I5020 Unspecified systolic (congestive) heart failure: Secondary | ICD-10-CM | POA: Diagnosis not present

## 2024-07-09 HISTORY — PX: RIGHT/LEFT HEART CATH AND CORONARY ANGIOGRAPHY: CATH118266

## 2024-07-09 LAB — POCT I-STAT EG7
Acid-Base Excess: 1 mmol/L (ref 0.0–2.0)
Acid-Base Excess: 2 mmol/L (ref 0.0–2.0)
Bicarbonate: 27.7 mmol/L (ref 20.0–28.0)
Bicarbonate: 28.2 mmol/L — ABNORMAL HIGH (ref 20.0–28.0)
Calcium, Ion: 1.22 mmol/L (ref 1.15–1.40)
Calcium, Ion: 1.23 mmol/L (ref 1.15–1.40)
HCT: 39 % (ref 36.0–46.0)
HCT: 39 % (ref 36.0–46.0)
Hemoglobin: 13.3 g/dL (ref 12.0–15.0)
Hemoglobin: 13.3 g/dL (ref 12.0–15.0)
O2 Saturation: 67 %
O2 Saturation: 68 %
Potassium: 3.9 mmol/L (ref 3.5–5.1)
Potassium: 3.9 mmol/L (ref 3.5–5.1)
Sodium: 137 mmol/L (ref 135–145)
Sodium: 138 mmol/L (ref 135–145)
TCO2: 29 mmol/L (ref 22–32)
TCO2: 30 mmol/L (ref 22–32)
pCO2, Ven: 51 mmHg (ref 44–60)
pCO2, Ven: 51.4 mmHg (ref 44–60)
pH, Ven: 7.343 (ref 7.25–7.43)
pH, Ven: 7.347 (ref 7.25–7.43)
pO2, Ven: 38 mmHg (ref 32–45)
pO2, Ven: 38 mmHg (ref 32–45)

## 2024-07-09 SURGERY — RIGHT/LEFT HEART CATH AND CORONARY ANGIOGRAPHY
Anesthesia: LOCAL

## 2024-07-09 MED ORDER — FENTANYL CITRATE (PF) 100 MCG/2ML IJ SOLN
INTRAMUSCULAR | Status: AC
  Filled 2024-07-09: qty 2

## 2024-07-09 MED ORDER — SODIUM CHLORIDE 0.9% FLUSH: 3.0000 mL | Freq: Two times a day (BID) | INTRAVENOUS | Status: DC

## 2024-07-09 MED ORDER — HEPARIN (PORCINE) IN NACL 1000-0.9 UT/500ML-% IV SOLN
INTRAVENOUS | Status: DC | PRN
  Administered 2024-07-09 (×2): 500 mL

## 2024-07-09 MED ORDER — LIDOCAINE HCL (PF) 1 % IJ SOLN
INTRAMUSCULAR | Status: AC
  Filled 2024-07-09: qty 30

## 2024-07-09 MED ORDER — FENTANYL CITRATE (PF) 100 MCG/2ML IJ SOLN
INTRAMUSCULAR | Status: DC | PRN
  Administered 2024-07-09 (×4): 25 ug via INTRAVENOUS

## 2024-07-09 MED ORDER — VERAPAMIL HCL 2.5 MG/ML IV SOLN
INTRAVENOUS | Status: DC | PRN
  Administered 2024-07-09: 10 mL via INTRA_ARTERIAL

## 2024-07-09 MED ORDER — HEPARIN SODIUM (PORCINE) 1000 UNIT/ML IJ SOLN
INTRAMUSCULAR | Status: AC
Start: 2024-07-09 — End: 2024-07-09
  Filled 2024-07-09: qty 10

## 2024-07-09 MED ORDER — LIDOCAINE HCL (PF) 1 % IJ SOLN
INTRAMUSCULAR | Status: DC | PRN
  Administered 2024-07-09 (×2): 2 mL via INTRADERMAL

## 2024-07-09 MED ORDER — MIDAZOLAM HCL 2 MG/2ML IJ SOLN
INTRAMUSCULAR | Status: DC | PRN
  Administered 2024-07-09 (×3): 1 mg via INTRAVENOUS

## 2024-07-09 MED ORDER — MIDAZOLAM HCL 2 MG/2ML IJ SOLN
INTRAMUSCULAR | Status: AC
  Filled 2024-07-09: qty 2

## 2024-07-09 MED ORDER — IOHEXOL 350 MG/ML SOLN
INTRAVENOUS | Status: DC | PRN
  Administered 2024-07-09: 50 mL

## 2024-07-09 MED ORDER — VERAPAMIL HCL 2.5 MG/ML IV SOLN
INTRAVENOUS | Status: AC
  Filled 2024-07-09: qty 2

## 2024-07-09 MED ORDER — FREE WATER: 500.0000 mL | Freq: Once | Status: DC

## 2024-07-09 MED ORDER — SODIUM CHLORIDE 0.9 % IV SOLN
INTRAVENOUS | Status: DC | PRN
  Administered 2024-07-09: 10 mL/h via INTRAVENOUS

## 2024-07-09 MED ORDER — SODIUM CHLORIDE 0.9% FLUSH: 3.0000 mL | INTRAVENOUS | Status: DC | PRN

## 2024-07-09 MED ORDER — NITROGLYCERIN 1 MG/10 ML FOR IR/CATH LAB
INTRA_ARTERIAL | Status: AC
  Filled 2024-07-09: qty 10

## 2024-07-09 MED ORDER — SODIUM CHLORIDE 0.9 % IV SOLN: 250.0000 mL | INTRAVENOUS | Status: DC | PRN

## 2024-07-09 MED ORDER — NITROGLYCERIN 1 MG/10 ML FOR IR/CATH LAB
INTRA_ARTERIAL | Status: DC | PRN
  Administered 2024-07-09: 100 ug

## 2024-07-09 MED ORDER — ACETAMINOPHEN 325 MG PO TABS: 650.0000 mg | ORAL_TABLET | ORAL | Status: DC | PRN

## 2024-07-09 MED ORDER — FREE WATER
250.0000 mL | Freq: Once | Status: AC
  Administered 2024-07-09: 250 mL via ORAL

## 2024-07-09 MED ORDER — ASPIRIN 81 MG PO CHEW: 81.0000 mg | CHEWABLE_TABLET | Freq: Once | ORAL | Status: DC

## 2024-07-09 MED ORDER — HEPARIN SODIUM (PORCINE) 1000 UNIT/ML IJ SOLN
INTRAMUSCULAR | Status: DC | PRN
  Administered 2024-07-09: 4000 [IU] via INTRAVENOUS

## 2024-07-09 MED ORDER — SODIUM CHLORIDE 0.9 % IV SOLN
250.0000 mL | INTRAVENOUS | Status: DC | PRN
Start: 2024-07-09 — End: 2024-07-09

## 2024-07-09 SURGICAL SUPPLY — 13 items
CATH INFINITI MULTIPACK ANG 4F (CATHETERS) IMPLANT
CATH SWAN GANZ 7F STRAIGHT (CATHETERS) IMPLANT
DEVICE RAD COMP TR BAND LRG (VASCULAR PRODUCTS) IMPLANT
GLIDESHEATH SLEND SS 6F .021 (SHEATH) IMPLANT
GLIDESHEATH SLENDER 7FR .021G (SHEATH) IMPLANT
GUIDEWIRE .025 260CM (WIRE) IMPLANT
GUIDEWIRE INQWIRE 1.5J.035X260 (WIRE) IMPLANT
KIT HEART LEFT (KITS) IMPLANT
PACK CARDIAC CATHETERIZATION (CUSTOM PROCEDURE TRAY) ×1 IMPLANT
SHEATH GLIDE SLENDER 4/5FR (SHEATH) IMPLANT
SHEATH PINNACLE 5F 10CM (SHEATH) IMPLANT
SHEATH PROBE COVER 6X72 (BAG) IMPLANT
TRANSDUCER W/STOPCOCK (MISCELLANEOUS) IMPLANT

## 2024-07-09 NOTE — Interval H&P Note (Signed)
 History and Physical Interval Note:  07/09/2024 7:48 AM  Allison Leonard  has presented today for surgery, with the diagnosis of heart failure.  The various methods of treatment have been discussed with the patient and family. After consideration of risks, benefits and other options for treatment, the patient has consented to  Procedure(s): RIGHT/LEFT HEART CATH AND CORONARY ANGIOGRAPHY (N/A) as a surgical intervention.  The patient's history has been reviewed, patient examined, no change in status, stable for surgery.  I have reviewed the patient's chart and labs.  Questions were answered to the patient's satisfaction.     Wilena Tyndall

## 2024-07-09 NOTE — Discharge Instructions (Signed)

## 2024-07-09 NOTE — Progress Notes (Signed)
 TR BAND REMOVAL  LOCATION:    right radial  DEFLATED PER PROTOCOL:    Yes.    TIME BAND OFF / DRESSING APPLIED: 07/09/24 at 1030   SITE UPON ARRIVAL:    Level 0  SITE AFTER BAND REMOVAL:    Level 0  CIRCULATION SENSATION AND MOVEMENT:    Within Normal Limits   Yes.    COMMENTS:

## 2024-07-10 ENCOUNTER — Encounter (HOSPITAL_COMMUNITY): Payer: Self-pay | Admitting: Cardiology

## 2024-07-14 ENCOUNTER — Other Ambulatory Visit (HOSPITAL_COMMUNITY): Payer: Self-pay

## 2024-07-14 ENCOUNTER — Telehealth: Payer: Self-pay

## 2024-07-14 NOTE — Telephone Encounter (Signed)
 Pharmacy Patient Advocate Encounter  Received notification from [employer] that Prior Authorization for NURTEC 75MG  has been APPROVED from 9.9.25 to 9.9.26. Ran test claim, Copay is $4. This test claim was processed through [facility]- copay amounts may vary at other pharmacies due to pharmacy/plan contracts, or as the patient moves through the different stages of their insurance plan.   PA #/Case ID/Reference #: [id number]

## 2024-07-14 NOTE — Telephone Encounter (Signed)
 Pharmacy Patient Advocate Encounter   Received notification from Cover[employer]Meds that prior authorization for Nurtec 75MG  dispersible tablets is required/requested.   Insurance verification completed.   The patient is insured through [employer] .   Per test claim: PA required; PA submitted to above mentioned insurance via Latent Key/confirmation #/EOC [id number] Status is pending

## 2024-07-17 ENCOUNTER — Encounter: Payer: Self-pay | Admitting: Thoracic Surgery (Cardiothoracic Vascular Surgery)

## 2024-07-17 ENCOUNTER — Ambulatory Visit
Attending: Thoracic Surgery (Cardiothoracic Vascular Surgery) | Admitting: Thoracic Surgery (Cardiothoracic Vascular Surgery)

## 2024-07-17 VITALS — BP 129/80 | HR 112 | Resp 20 | Ht 62.0 in | Wt 102.0 lb

## 2024-07-17 DIAGNOSIS — I050 Rheumatic mitral stenosis: Secondary | ICD-10-CM | POA: Diagnosis not present

## 2024-07-17 DIAGNOSIS — I051 Rheumatic mitral insufficiency: Secondary | ICD-10-CM | POA: Insufficient documentation

## 2024-07-17 DIAGNOSIS — I061 Rheumatic aortic insufficiency: Secondary | ICD-10-CM | POA: Insufficient documentation

## 2024-07-17 NOTE — Progress Notes (Signed)
 [address]       [place] [address]             [phone]        DACI STUBBE [facility] Medical Record #[id number] Date of Birth: 04-03-1976  Referring: Daneen Damien BROCKS, NP Primary Care: Lennie Raguel MATSU, DO Primary Cardiologist:Elliott ONEIDA Decent, MD  Chief Complaint:    Chief Complaint  Patient presents with   Aortic Insuffiency   Mitral Regurgitation    New patient consult, CATH 9/4, TEE 6/24, ECHO 5/5, coronary CT 2/22    History of Present Illness:     Allison Leonard is a 48 y.o. 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  She is symptomatic with shortness of breath.   Past Medical History:  Diagnosis Date   Acid reflux 07/04/2018   Anemia    Aneurysm of unspecified site [facility]) 2023   Brain aneurysm 2022   Cannabinoid hyperemesis syndrome 10/27/2018   Celiac disease/sprue    Cyclic vomiting syndrome    Dyspnea    in past per patient 11/24/21   Generalized anxiety disorder 07/04/2018   History of colon polyps    last colonoscopy 2018 removed multiple benign polyps   Migraines 07/04/2018    Past Surgical History:  Procedure Laterality Date   ABDOMINAL HYSTERECTOMY  2006   APPENDECTOMY  1999   COLONOSCOPY  2018   COLONOSCOPY W/ POLYPECTOMY  04/2020   [facility] TA, 3 yr recall   COLONOSCOPY W/ POLYPECTOMY  06/20/2023   Glendia Holt at [facility]   ESOPHAGOGASTRODUODENOSCOPY  2018   IR 3D INDEPENDENT WKST  09/01/2021   IR ANGIO INTRA EXTRACRAN SEL COM CAROTID INNOMINATE BILAT MOD SED  09/01/2021   IR ANGIO INTRA EXTRACRAN SEL INTERNAL CAROTID UNI R MOD SED  11/29/2021   IR ANGIO VERTEBRAL SEL VERTEBRAL BILAT MOD SED  09/01/2021   IR ANGIOGRAM FOLLOW UP STUDY  11/29/2021   IR CT HEAD LTD  11/29/2021   IR RADIOLOGIST EVAL & MGMT  08/21/2021   IR RADIOLOGIST EVAL & MGMT  09/07/2021   IR RADIOLOGIST EVAL & MGMT  12/19/2021   IR  TRANSCATH/EMBOLIZ  11/27/2021   RADIOLOGY WITH ANESTHESIA N/A 11/27/2021   Procedure: EATHER;  Surgeon: Dolphus Carrion, MD;  Location: [facility] OR;  Service: Radiology;  Laterality: N/A;   RIGHT/LEFT HEART CATH AND CORONARY ANGIOGRAPHY N/A 07/09/2024   Procedure: RIGHT/LEFT HEART CATH AND CORONARY ANGIOGRAPHY;  Surgeon: Gardenia Led, DO;  Location: [facility] INVASIVE CV LAB;  Service: Cardiovascular;  Laterality: N/A;   TRANSESOPHAGEAL ECHOCARDIOGRAM (CATH LAB) N/A 04/28/2024   Procedure: TRANSESOPHAGEAL ECHOCARDIOGRAM;  Surgeon: Loni Soyla LABOR, MD;  Location: [facility] INVASIVE CV LAB;  Service: Cardiovascular;  Laterality: N/A;    Social History:  Social History   Tobacco Use  Smoking Status Former   Current packs/day: 0.00   Average packs/day: 0.5 packs/day for 0.2 years (0.1 ttl pk-yrs)   Types: Cigarettes   Start date: 2024   Quit date: 01/03/2023   Years since quitting: 1.5  Smokeless Tobacco Never    Social History   Substance and Sexual Activity  Alcohol Use Not Currently     Allergies  Allergen Reactions   Zofran  [Ondansetron  Hcl] Anaphylaxis   Egg-Derived Products Other (See Comments)    Severe stomach cramps   Morphine  And Codeine Itching      Current Outpatient Medications  Medication Sig  Dispense Refill   aspirin  325 MG tablet Take 325 mg by mouth daily.     dicyclomine  (BENTYL ) 20 MG tablet TAKE 1 TABLET (20 MG TOTAL) BY MOUTH 3 (THREE) TIMES DAILY BEFORE MEALS. 90 tablet 2   empagliflozin  (JARDIANCE ) 10 MG TABS tablet Take 1 tablet (10 mg total) by mouth daily before breakfast. 90 tablet 3   famotidine  (PEPCID ) 20 MG tablet Take 1 tablet (20 mg total) by mouth 2 (two) times daily. 180 tablet 0   Fremanezumab -vfrm (AJOVY ) 225 MG/1.5ML SOAJ Inject 225 mg into the skin every 28 (twenty-eight) days. 1.68 mL 11   losartan  (COZAAR ) 25 MG tablet Take 1 tablet (25 mg total) by mouth daily. 90 tablet 3   lubiprostone  (AMITIZA ) 24 MCG capsule Take 1 capsule (24 mcg  total) by mouth 2 (two) times daily with a meal. (Patient taking differently: Take 24 mcg by mouth daily as needed for constipation.) 60 capsule 2   metoprolol  succinate (TOPROL  XL) 25 MG 24 hr tablet Take 1 tablet (25 mg total) by mouth daily. 90 tablet 3   promethazine  (PHENERGAN ) 25 MG tablet TAKE 1 TABLET BY MOUTH EVERY 6 HOURS AS NEEDED FOR NAUSEA OR VOMITING. 28 tablet 0   Rimegepant Sulfate (NURTEC) 75 MG TBDP Take 1 tablet (75 mg total) by mouth daily as needed. 16 tablet 11   tiZANidine  (ZANAFLEX ) 4 MG tablet Take 1 tablet (4 mg total) by mouth 3 (three) times daily as needed for muscle spasms. 60 tablet 0   Current Facility-Administered Medications  Medication Dose Route Frequency Provider Last Rate Last Admin   0.9 %  sodium chloride  infusion  500 mL Intravenous Once Stacia Glendia BRAVO, MD        (Not in a hospital admission)   Family History  Problem Relation Age of Onset   Heart disease Father    Diabetes Father    Hypertension Father    Hyperlipidemia Father    Colon polyps Father    Colon cancer Sister    Cervical cancer Maternal Aunt    Stomach cancer Maternal Aunt    Throat cancer Maternal Uncle    Stroke Maternal Uncle    Heart attack Maternal Grandmother    Heart attack Paternal Grandmother    Down syndrome Son    Esophageal cancer Neg Hx    Rectal cancer Neg Hx      Review of Systems:   Review of Systems  Constitutional:  Positive for malaise/fatigue.  Respiratory:  Positive for shortness of breath.   Cardiovascular:  Negative for chest pain.      Physical Exam: BP 129/80 (BP Location: Left Arm, Patient Position: Sitting, Cuff Size: Small)   Pulse (!) 112   Resp 20   Ht 5' 2 (1.575 m)   Wt 102 lb (46.3 kg)   SpO2 97% Comment: RA  BMI 18.66 kg/m  Physical Exam Constitutional:      General: She is not in acute distress.    Appearance: She is not ill-appearing.  HENT:     Head: Normocephalic and atraumatic.  Eyes:     Extraocular  Movements: Extraocular movements intact.  Cardiovascular:     Rate and Rhythm: Tachycardia present.  Abdominal:     General: Abdomen is flat. There is no distension.  Musculoskeletal:        General: Normal range of motion.     Cervical back: Normal range of motion.  Skin:    General: Skin is warm and dry.  Neurological:  General: No focal deficit present.     Mental Status: She is alert and oriented to person, place, and time.       Diagnostic Studies & Laboratory data: Cardiac Studies & Procedures   ______________________________________________________________________________________________     ECHOCARDIOGRAM  ECHOCARDIOGRAM COMPLETE 03/09/2024  Narrative ECHOCARDIOGRAM REPORT    Patient Name:   TEARA DUERKSEN Date of Exam: 03/09/2024 Medical Rec #:  [id number]               Height:       62.0 in Accession #:    [id number]              Weight:       108.5 lb Date of Birth:  1976-10-29               BSA:          1.474 m Patient Age:    47 years                BP:           124/86 mmHg Patient Gender: F                       HR:           100 bpm. Exam Location:  [address]  Procedure: 2D Echo, 3D Echo, Cardiac Doppler and Color Doppler (Both Spectral and Color Flow Doppler were utilized during procedure).  Indications:    I05.0 Mitral stenosis  History:        Patient has prior history of Echocardiogram examinations, most recent 12/07/2022. Signs/Symptoms:Dyspnea; Risk Factors:Hypertension.  Sonographer:    Waldo Guadalajara RCS Referring Phys: [id number] DARRYLE NED O'NEAL  IMPRESSIONS   1. Compared with the echo 12/2022, systolic function has decreased from 50-55% to 25-30%. Left ventricular ejection fraction, by estimation, is 25 to 30%. The left ventricle has severely decreased function. The left ventricle demonstrates global hypokinesis. Left ventricular diastolic parameters are consistent with Grade I diastolic dysfunction (impaired relaxation).  Elevated left ventricular end-diastolic pressure. 2. Right ventricular systolic function is normal. The right ventricular size is normal. There is normal pulmonary artery systolic pressure. 3. 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 No evidence of mitral stenosis. 4. The aortic valve is tricuspid. Aortic valve regurgitation is moderate. No aortic stenosis is present. Aortic regurgitation PHT measures 412 msec. 5. The inferior vena cava is normal in size with greater than 50% respiratory variability, suggesting right atrial pressure of 3 mmHg.  FINDINGS Left Ventricle: Compared with the echo 12/2022, systolic function has decreased from 50-55% to 25-30%. Left ventricular ejection fraction, by estimation, is 25 to 30%. The left ventricle has severely decreased function. The left ventricle demonstrates global hypokinesis. The left ventricular internal cavity size was normal in size. There is no left ventricular hypertrophy. Left ventricular diastolic parameters are consistent with Grade I diastolic dysfunction (impaired relaxation). Elevated left ventricular end-diastolic pressure.   LV Wall Scoring: There is diffuse hypokinesis.  Right Ventricle: The right ventricular size is normal. No increase in right ventricular wall thickness. Right ventricular systolic function is normal. There is normal pulmonary artery systolic pressure. The tricuspid regurgitant velocity is 1.44 m/s, and with an assumed right atrial pressure of 3 mmHg, the estimated right ventricular systolic pressure is 11.3 mmHg.  Left Atrium: Left atrial size was normal in size.  Right Atrium: Right atrial size was normal in size.  Pericardium: Trivial pericardial effusion is present.  Mitral Valve: The mitral valve is rheumatic. There is mild thickening of the mitral valve leaflet(s). Mild mitral valve regurgitation. No evidence of mitral valve stenosis. MV peak gradient, 13.2 mmHg. The mean mitral valve  gradient is 4.7 mmHg.  Tricuspid Valve: The tricuspid valve is normal in structure. Tricuspid valve regurgitation is trivial. No evidence of tricuspid stenosis.  Aortic Valve: The aortic valve is tricuspid. Aortic valve regurgitation is moderate. Aortic regurgitation PHT measures 412 msec. No aortic stenosis is present.  Pulmonic Valve: The pulmonic valve was normal in structure. Pulmonic valve regurgitation is not visualized. No evidence of pulmonic stenosis.  Aorta: The aortic root is normal in size and structure.  Venous: The inferior vena cava is normal in size with greater than 50% respiratory variability, suggesting right atrial pressure of 3 mmHg.  IAS/Shunts: No atrial level shunt detected by color flow Doppler.  Additional Comments: 3D was performed not requiring image post processing on an independent workstation and was normal.   LEFT VENTRICLE PLAX 2D LVIDd:         5.70 cm   Diastology LVIDs:         5.00 cm   LV e' medial:    6.31 cm/s LV PW:         0.90 cm   LV E/e' medial:  15.7 LV IVS:        0.90 cm   LV e' lateral:   9.79 cm/s LVOT diam:     2.00 cm   LV E/e' lateral: 10.1 LV SV:         38 LV SV Index:   26 LVOT Area:     3.14 cm  3D Volume EF: 3D EF:        27 % LV EDV:       167 ml LV ESV:       121 ml LV SV:        45 ml  RIGHT VENTRICLE RV Basal diam:  1.90 cm RV S prime:     7.40 cm/s TAPSE (M-mode): 1.8 cm RVSP:           11.3 mmHg  LEFT ATRIUM             Index        RIGHT ATRIUM           Index LA diam:        2.40 cm 1.63 cm/m   RA Pressure: 3.00 mmHg LA Vol (A2C):   45.8 ml 31.07 ml/m  RA Area:     7.44 cm LA Vol (A4C):   23.5 ml 15.94 ml/m  RA Volume:   11.90 ml  8.07 ml/m LA Biplane Vol: 33.1 ml 22.46 ml/m AORTIC VALVE LVOT Vmax:   75.40 cm/s LVOT Vmean:  45.550 cm/s LVOT VTI:    0.122 m AI PHT:      412 msec  AORTA Ao Root diam: 2.70 cm Ao Asc diam:  2.50 cm  MITRAL VALVE                TRICUSPID VALVE MV Area (PHT):  11.16 cm    TR Peak grad:   8.3 mmHg MV Area VTI:   1.08 cm     TR Vmax:        144.00 cm/s MV Peak grad:  13.2 mmHg    Estimated RAP:  3.00 mmHg MV Mean grad:  4.7 mmHg     RVSP:  11.3 mmHg MV Vmax:       1.82 m/s MV Vmean:      95.5 cm/s    SHUNTS MV Decel Time: 68 msec      Systemic VTI:  0.12 m MR Peak grad: 82.4 mmHg     Systemic Diam: 2.00 cm MR Mean grad: 53.0 mmHg MR Vmax:      454.00 cm/s MR Vmean:     345.0 cm/s MV E velocity: 99.00 cm/s MV A velocity: 172.00 cm/s MV E/A ratio:  0.58  Annabella Scarce MD Electronically signed by Annabella Scarce MD Signature Date/Time: 03/09/2024/3:29:56 PM    Final   TEE  ECHO TEE 04/28/2024  Narrative TRANSESOPHOGEAL ECHO REPORT    Patient Name:   CARRYE GOLLER Date of Exam: 04/28/2024 Medical Rec #:  [id number]               Height:       62.0 in Accession #:    [id number]              Weight:       115.0 lb Date of Birth:  December 10, 1975               BSA:          1.511 m Patient Age:    47 years                BP:           130/70 mmHg Patient Gender: F                       HR:           94 bpm. Exam Location:  Outpatient  Procedure: Cardiac Doppler, Color Doppler, Transesophageal Echo and 3D Echo (Both Spectral and Color Flow Doppler were utilized during procedure).  Indications:     Rheumatic Aortic Valve disease  History:         Patient has prior history of Echocardiogram examinations, most recent 03/09/2024.  Sonographer:     Tinnie Gosling RDCS Referring Phys:  [phone] DAMIEN BROCKS MONGE Diagnosing Phys: Soyla Merck MD  PROCEDURE: After discussion of the risks and benefits of a TEE, an informed consent was obtained from the patient. The patient was intubated. The transesophogeal probe was passed without difficulty through the esophogus of the patient. Imaged were obtained with the patient in a left lateral decubitus position. Sedation performed by different physician. Image quality was good. The  patient's vital signs; including heart rate, blood pressure, and oxygen saturation; remained stable throughout the procedure. The patient developed no complications during the procedure.  IMPRESSIONS   1. Left ventricular ejection fraction, by estimation, is 25%. The left ventricle has severely decreased function. The left ventricle demonstrates global hypokinesis. The left ventricular internal cavity size was moderately dilated. 2. Right ventricular systolic function is mildly reduced. The right ventricular size is normal. 3. Left atrial size was moderately dilated. No left atrial/left atrial appendage thrombus was detected. 4. The mitral valve is rheumatic. Moderate-severe mitral valve regurgitation, in some pulmonary vein views there is a possible systolic reversal. Moderate mitral stenosis. The mean mitral valve gradient is 6.0 mmHg with average heart rate of 69 bpm. MVA PISA 1.2 cm2. 5. The aortic valve is tricuspid. There is mild thickening of the aortic valve. Aortic valve regurgitation is moderate to severe. No aortic stenosis is present. Aortic regurgitation PHT measures 354 msec, AR vena contracta 0.5 cm. Holodiastolic reversals  with reversal TVI challenging to obtain but appears to be 9 cm. 6. Agitated saline contrast bubble study was negative, with no evidence of any interatrial shunt. 7. 3D performed of the mitral valve and 3D performed of the aortic valve and demonstrates mitral valve is rheumatic, and aortic valve is mildly thickened with central AI.  FINDINGS Left Ventricle: Left ventricular ejection fraction, by estimation, is 25%. The left ventricle has severely decreased function. The left ventricle demonstrates global hypokinesis. The left ventricular internal cavity size was moderately dilated.  Right Ventricle: The right ventricular size is normal. No increase in right ventricular wall thickness. Right ventricular systolic function is mildly reduced.  Left Atrium: Left  atrial size was moderately dilated. No left atrial/left atrial appendage thrombus was detected.  Right Atrium: Right atrial size was normal in size.  Pericardium: Trivial pericardial effusion is present.  Mitral Valve: The mitral valve is rheumatic. Moderate to severe mitral valve regurgitation. Moderate mitral valve stenosis. MV peak gradient, 10.8 mmHg. The mean mitral valve gradient is 6.0 mmHg with average heart rate of 69 bpm.  Tricuspid Valve: The tricuspid valve is grossly normal. Tricuspid valve regurgitation is mild.  Aortic Valve: AV vena contracta 0.5 cm. Central AI. The aortic valve is tricuspid. There is mild thickening of the aortic valve. Aortic valve regurgitation is moderate to severe. Aortic regurgitation PHT measures 354 msec. No aortic stenosis is present. Aortic valve mean gradient measures 3.0 mmHg. Aortic valve peak gradient measures 7.6 mmHg. Aortic valve area, by VTI measures 1.74 cm.  Pulmonic Valve: The pulmonic valve was grossly normal. Pulmonic valve regurgitation is trivial.  Aorta: The aortic root and ascending aorta are structurally normal, with no evidence of dilitation.  IAS/Shunts: No atrial level shunt detected by color flow Doppler. Agitated saline contrast was given intravenously to evaluate for intracardiac shunting. Agitated saline contrast bubble study was negative, with no evidence of any interatrial shunt.  Additional Comments: 3D was performed not requiring image post processing on an independent workstation and was abnormal. Spectral Doppler performed.  LEFT VENTRICLE PLAX 2D LVOT diam:     2.10 cm LV SV:         39 LV SV Index:   26 LVOT Area:     3.46 cm   AORTIC VALVE AV Area (Vmax):    1.85 cm AV Area (Vmean):   1.85 cm AV Area (VTI):     1.74 cm AV Vmax:           138.00 cm/s AV Vmean:          80.900 cm/s AV VTI:            0.224 m AV Peak Grad:      7.6 mmHg AV Mean Grad:      3.0 mmHg LVOT Vmax:         73.73 cm/s LVOT  Vmean:        43.210 cm/s LVOT VTI:          0.113 m LVOT/AV VTI ratio: 0.50 AI PHT:            354 msec  AORTA Ao Root diam: 2.80 cm Ao Asc diam:  2.40 cm  MITRAL VALVE MV Peak grad: 10.8 mmHg  SHUNTS MV Mean grad: 6.0 mmHg   Systemic VTI:  0.11 m MV Vmax:      1.64 m/s   Systemic Diam: 2.10 cm MV Vmean:     117.0 cm/s MV PHT:  94.96 msec  Soyla Merck MD Electronically signed by Soyla Merck MD Signature Date/Time: 04/29/2024/12:30:39 PM    Final (Updated)    CT SCANS  CT CORONARY MORPH W/CTA COR W/SCORE 12/27/2022  Addendum 12/30/2022 11:12 AM ADDENDUM REPORT: 12/30/2022 11:09  EXAM: OVER-READ INTERPRETATION  CT CHEST  The following report is an over-read performed by radiologist Dr. Andrea Lurie [facility] on 12/30/2022. This over-read does not include interpretation of cardiac or coronary anatomy or pathology. The coronary CTA interpretation by the cardiologist is attached.  COMPARISON:  Chest CTA 01/04/2022  FINDINGS: Vascular: No aortic atherosclerosis. The included aorta is normal in caliber. No pulmonary embolus where included on this exam not tailored to pulmonary artery assessment.  Mediastinum/nodes: No adenopathy or mass. Unremarkable esophagus.  Lungs: Subsegmental linear atelectasis or scarring within the right middle lobe and lingula. No pulmonary nodule. No pleural fluid. The included airways are patent.  Upper abdomen: No acute or unexpected findings. Incidental punctate granuloma in the right lobe of the liver.  Musculoskeletal: There are no acute or suspicious osseous abnormalities. Mild pectus excavatum deformity.  IMPRESSION: 1. Minor subsegmental atelectasis or scarring in the right middle lobe and lingula. 2. Mild pectus excavatum deformity.   Electronically Signed By: Andrea Gasman M.D. On: 12/30/2022 11:09  Narrative CLINICAL DATA:  Chest pain  EXAM: Cardiac/Coronary CTA  TECHNIQUE: A  non-contrast, gated CT scan was obtained with axial slices of 3 mm through the heart for calcium scoring. Calcium scoring was performed using the Agatston method. A 70 kV prospective, gated, contrast cardiac scan was obtained. Gantry rotation speed was 250 msecs and collimation was 0.6 mm. Two sublingual nitroglycerin  tablets (0.8 mg) were given. The 3D data set was reconstructed in 5% intervals of the 35-75% of the R-R cycle. Diastolic phases were analyzed on a dedicated workstation using MPR, MIP, and VRT modes. The patient received 95 cc of contrast.  FINDINGS: Image quality: Excellent.  Noise artifact is: Limited.  Coronary Arteries:  Normal coronary origin.  Right dominance.  Left main: The left main is a large caliber vessel with a normal take off from the left coronary cusp that bifurcates to form a left anterior descending artery and a left circumflex artery. trifurcates into a LAD, LCX, and ramus intermedius. There is no plaque or stenosis.  Left anterior descending artery: The LAD is patent without evidence of plaque or stenosis. The LAD gives off 1 patent diagonal branch.  Left circumflex artery: The LCX is non-dominant and patent with no evidence of plaque or stenosis. The LCX gives off 1 patent obtuse marginal branch.  Right coronary artery: The RCA is dominant with normal take off from the right coronary cusp. There is no evidence of plaque or stenosis. The RCA terminates as a PDA without evidence of plaque or stenosis.  Right Atrium: Right atrial size is within normal limits.  Right Ventricle: The right ventricular cavity is within normal limits.  Left Atrium: Left atrial size is normal in size with no left atrial appendage filling defect.  Left Ventricle: The ventricular cavity size is within normal limits. Small apical diverticulum.  Pulmonary arteries: Normal in size without proximal filling defect.  Pulmonary veins: Normal pulmonary venous  drainage.  Pericardium: Normal thickness without significant effusion or calcium present.  Cardiac valves: The aortic valve is trileaflet without significant calcification. The mitral valve is normal without significant calcification.  Aorta: Normal caliber without significant disease.  Extra-cardiac findings: See attached radiology report for non-cardiac structures.  IMPRESSION:  1. Coronary calcium score of 0.  2. Normal coronary origin with right dominance.  3. Normal coronary arteries.  RECOMMENDATIONS: 1. No evidence of CAD (0%). Consider non-atherosclerotic causes of chest pain.  Darryle Decent, MD  Electronically Signed: By: Darryle Decent M.D. On: 12/27/2022 21:55     ______________________________________________________________________________________________     I have independently reviewed the above radiologic studies and discussed with the patient   Recent Lab Findings: Lab Results  Component Value Date   WBC 9.0 06/30/2024   HGB 13.3 07/09/2024   HGB 13.3 07/09/2024   HCT 39.0 07/09/2024   HCT 39.0 07/09/2024   PLT 171 06/30/2024   GLUCOSE 81 06/30/2024   ALT 19 11/18/2023   AST 27 11/18/2023   NA 137 07/09/2024   NA 138 07/09/2024   K 3.9 07/09/2024   K 3.9 07/09/2024   CL 99 06/30/2024   CREATININE 0.63 06/30/2024   BUN 14 06/30/2024   CO2 25 06/30/2024   TSH 1.603 06/30/2024   INR 0.9 01/04/2022      Assessment / Plan:   48 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  Her cardiac index is too high for a heart transplant, additionally, she has smoked in the last 6 months.  We discussed options today in multidisciplinary conference to potentially treat her AI with TAVR.  We also discussed repeating her echo to see if there is any major improvement in her heart function.  He is very high risk for surgery, and would not be an ideal LVAD candidate due to her MS.  I will await more information from Dr. Gardenia.     I   spent 20 minutes counseling the patient face to face.   Linnie MALVA Rayas 07/17/2024 12:22 PM

## 2024-07-28 ENCOUNTER — Other Ambulatory Visit: Payer: Self-pay | Admitting: Neurology

## 2024-08-04 NOTE — Progress Notes (Incomplete)
 ADVANCED HEART FAILURE CLINIC NOTE  Referring Physician: Lennie Raguel MATSU, DO  Primary Care: Lennie Raguel MATSU, DO Primary Cardiologist: Heart Failure: Ria Commander, DO  CC: HFrEF, Severe MR/AI  HPI: Allison Leonard is a 48 y.o. female with severe MR, AI and HFrEF, NICM, and tobacco abuse.   Pertinent Family & social hx:  - Dad - coronary disease, Aunt died form heart failure.  - Smokes 'a few' cigarettes daily.   Cardiac History:  - TTE in 3/23 with LVEF 50-55% and mild AI/MR. Repeat echo in 2/24 with LVEF 50% - Established care with Dr. Barbaraann in 2023 due to tachycardia / SOB.  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  - TEE on 04/28/24 w/ EF 25%, mod-severe MR, mod MS, mod-sev AI.  - Referred to Dr. Shyrl for surgery and subsequently to [facility] for advanced therapies evaluation.    Interval hx:  - Today she returns for HF follow up. Stressed out because her sister has transitioned to comfort care at [facility]. She tells me her family wants to do everything possible to improve her condition. Complaining fatigue and shortness of breath with exertion. She gets short of breath with minimal exertion.  DeniesPND/Orthopnea. Appetite poor.  No fever or chills.    Taking all medications. Smoking 5-6 cigarettes a day and marijuana daily. Lives with her Mom and 3 adult children. Middle child has down syndrome.    PHYSICAL EXAM: Vitals:   08/05/24 0929  BP: 122/86  Pulse: (!) 112  SpO2: 97%   Wt Readings from Last 3 Encounters:  08/05/24 47.4 kg (104 lb 9.6 oz)  07/17/24 46.3 kg (102 lb)  07/01/24 47.6 kg (105 lb)    General:   Thin.  No resp difficulty Neck: no JVD.  Cor: Tachy Regular rate & rhythm. +S3 Lungs: clear Abdomen: soft, nontender, nondistended.  Extremities: no  edema Neuro: alert & oriented x3  EKG: SR with occasional PVCs   DATA REVIEW  ECG: 04/07/24: NSR w/ PVCs, LVH  As per my personal  interpretation  ECHO: 04/28/24: LVEF 25%, RV mildly reduced. 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 Mod-severe AI.   CATH: 07/09/2024  RA:                  2 mmHg (mean) RV:                  28/2 mmHg PA:                  30/8 mmHg (17 mean) PCWP:            6 mmHg (mean)                                     Estimated Fick CO/CI   3.4 L/min, 2.3 L/min/m2 Thermodilution CO/CI   4 L/min, 2.8 L/min/m2                                              TPG                 11  mmHg  PVR                 2.75-3.2 Wood Units  PAPi                11  Coronary angiography Complicated by severe radial spasm throughout the case.  Short LM with trifurcation into LAD, RM, Lcx. No obstructive CAD visualized. Lcx not well visualized due to radial artery spasm. Dominant RCA with no disease.    IMPRESSION: No coronary artery disease.  Normal to mildly reduced cardiac output / index.  Low filling pressures.  Mildly elevated PVR.    ASSESSMENT & PLAN:  Heart Failure with reduced fraction - Etiology & History: Likely valvular; mod-severe MR/AI 2/2 rheumatic changes. Unable to obtain CMR due to ICA embolization. Cath - no coronary disease, mildly reduced CO/CI. Low filling pressures.  - TEE June 2025 Echo LVEF 25%  previously EF was ~ 55%. In February 2024.  - NYHA Class III Stage C.   Functional decline since the last visit. SABRA - Volume status: Appears euvolemic Add digoxin 0.125 mg daily  - GDMT:  -  RAASi: losartan  25mg  daily Beta-blocker: toprol  25mg  daily -  Hydralazine/Nitrates: N/A -  SGLT2i: jardiance  10mg   -Consider spironolactone. - ICD: Evaluating for advanced therapies - Per Dr Gardenia discussed case with Dr. Shyrl who feels that she will be very high risk for x2 valve repair/replacement in the setting of biventricular failure.  Barrier to transplant will be tobacco use. We discussed this extensively. For LVAD, she would require  AVR. I am concerned about her MS also. Repair of both valves in the setting of mild RV dysfunction would make VAD implant high risk also. Will discuss with CTS colleagues.   Given functional decline, tachycardia, and EF 25% will need to start LVAD work up. She is not a candidate for transplant due to nicotine/marijuana use.  I have consulted VAD coordinators today with an in office visit to start VAD work up. I  will bring her back in 2 weeks to discuss advanced therapies/LVAD with Dr Gardenia.  Check BMET and pro bnp today.    Mod-severe AI/MR - see above - TEE on 04/28/24  Moderate MS - mean gradient of  Right ICA Aneurysm - s/p embolization at [facility] in 11/2021  Tobacco use - Encouraged to stop smoking.   Palpitations/PVCs Place 7 day Zio    Follow up in 2 weeks with Dr Gardenia. I spent 31 minutes reviewing records, interviewing/examining patient, and managing orders.     Thijs Brunton NP-C  9:36 AM

## 2024-08-05 ENCOUNTER — Encounter (HOSPITAL_COMMUNITY): Payer: Self-pay | Admitting: Cardiology

## 2024-08-05 ENCOUNTER — Ambulatory Visit (HOSPITAL_COMMUNITY)
Admission: RE | Admit: 2024-08-05 | Discharge: 2024-08-05 | Disposition: A | Discharge status: Home / Self Care | Source: Ambulatory Visit | Attending: Adult Health | Admitting: Adult Health

## 2024-08-05 ENCOUNTER — Other Ambulatory Visit (HOSPITAL_COMMUNITY): Payer: Self-pay | Admitting: Cardiology

## 2024-08-05 ENCOUNTER — Ambulatory Visit (HOSPITAL_COMMUNITY): Payer: Self-pay | Admitting: Adult Health

## 2024-08-05 ENCOUNTER — Inpatient Hospital Stay (HOSPITAL_COMMUNITY)
Admission: RE | Admit: 2024-08-05 | Discharge: 2024-08-05 | Disposition: A | Discharge status: Home / Self Care | Source: Ambulatory Visit | Attending: Cardiology | Admitting: Cardiology

## 2024-08-05 VITALS — BP 122/86 | HR 112 | Ht 62.0 in | Wt 104.6 lb

## 2024-08-05 DIAGNOSIS — Z79899 Other long term (current) drug therapy: Secondary | ICD-10-CM | POA: Diagnosis not present

## 2024-08-05 DIAGNOSIS — R002 Palpitations: Secondary | ICD-10-CM | POA: Diagnosis not present

## 2024-08-05 DIAGNOSIS — R0602 Shortness of breath: Secondary | ICD-10-CM | POA: Diagnosis not present

## 2024-08-05 DIAGNOSIS — I5022 Chronic systolic (congestive) heart failure: Secondary | ICD-10-CM | POA: Diagnosis not present

## 2024-08-05 DIAGNOSIS — Z720 Tobacco use: Secondary | ICD-10-CM

## 2024-08-05 DIAGNOSIS — F17210 Nicotine dependence, cigarettes, uncomplicated: Secondary | ICD-10-CM | POA: Insufficient documentation

## 2024-08-05 DIAGNOSIS — I493 Ventricular premature depolarization: Secondary | ICD-10-CM | POA: Insufficient documentation

## 2024-08-05 DIAGNOSIS — R000 Tachycardia, unspecified: Secondary | ICD-10-CM | POA: Diagnosis not present

## 2024-08-05 DIAGNOSIS — R5383 Other fatigue: Secondary | ICD-10-CM | POA: Diagnosis not present

## 2024-08-05 DIAGNOSIS — I428 Other cardiomyopathies: Secondary | ICD-10-CM | POA: Insufficient documentation

## 2024-08-05 DIAGNOSIS — I671 Cerebral aneurysm, nonruptured: Secondary | ICD-10-CM | POA: Insufficient documentation

## 2024-08-05 DIAGNOSIS — I080 Rheumatic disorders of both mitral and aortic valves: Secondary | ICD-10-CM | POA: Diagnosis not present

## 2024-08-05 LAB — BASIC METABOLIC PANEL WITH GFR
Anion gap: 10 (ref 5–15)
BUN: 10 mg/dL (ref 6–20)
CO2: 26 mmol/L (ref 22–32)
Calcium: 9.5 mg/dL (ref 8.9–10.3)
Chloride: 102 mmol/L (ref 98–111)
Creatinine, Ser: 0.56 mg/dL (ref 0.44–1.00)
GFR, Estimated: >60 mL/min (ref >60)
Glucose, Bld: 102 mg/dL — ABNORMAL HIGH (ref 70–99)
Potassium: 4 mmol/L (ref 3.5–5.1)
Sodium: 138 mmol/L (ref 135–145)

## 2024-08-05 LAB — TSH: TSH: 0.66 u[IU]/mL (ref 0.350–4.500)

## 2024-08-05 MED ORDER — DIGOXIN 125 MCG PO TABS: 0.1250 mg | ORAL_TABLET | Freq: Every day | ORAL | 3 refills | Status: DC

## 2024-08-05 NOTE — Progress Notes (Signed)
 MCS EDUCATION NOTE:                VAD coordinator briefly met with patient in heart failure clinic. Reports shortness of breath and dizziness with minimal activity. She is able to complete all ADLs independently. Currently smoking. Reports chronic headache due to brain aneurysm. Follows with neurology. Pt lives with her mother and 3 adult children. 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 States her mother would be able to care for him while she recovers from surgery if needed. She does not drive- mother provides transportation to appointments. Currently under a lot of emotional stress as her sister is in hospice care at [facility].   VAD educational packet including Understanding Your Options with Advanced Heart Failure, [place] Patient Agreement for VAD Evaluation and Potential Implantation consent, and Abbott Heartmate 3 Left Ventricular Device (LVAD) Patient Guide, [place] HM III Patient Education, [place] Mechanical Circulatory Support Program, and Decision Aids for Left Ventricular Assist Device reviewed in detail and left at bedside for continued reference. Questions answered regarding VAD implant, hospital stay, and what to expect when discharged home living with a heart pump.   Explained need for 24/7 care when pt is discharged home due to sternal precautions, adaptation to living on support, emotional support, consistent and meticulous exit site care and management, medication adherence and high volume of follow up visits with the VAD Clinic after discharge. States her mother (and possibly other family members) would serve as caregiver(s).   The patient understands that from this discussion it does not mean that they will receive the device, but that depends on an extensive evaluation process. The patient is aware of the fact that if at anytime they want to stop the evaluation process they can. Briefly discussed evaluation process  today.  All questions have been answered at this time and contact information was provided should she encounter any further questions. Will plan for her to follow up in VAD clinic in 2 weeks- 08/21/24 at 10:00. Instructed to bring her mother, and whomever else she would like to function as a caregiver to this appt for further discussion. Provided with VAD clinic phone number and instructed to call with any questions, or if she is feeling poorly and needs to be seen sooner. She verbalized understanding to all the above.    Total Session Time: 20 minutes  Isaiah Knoll RN VAD Coordinator  Office: [phone]  24/7 Pager: [phone]

## 2024-08-05 NOTE — Addendum Note (Signed)
 Encounter addended by: Lenetta Greig BIRCH, NP on: 08/05/2024 4:41 PM  Actions taken: Clinical Note Signed

## 2024-08-05 NOTE — Progress Notes (Signed)
 Zio patch placed onto patient.  All instructions and information reviewed with patient, they verbalize understanding with no questions.

## 2024-08-05 NOTE — Patient Instructions (Addendum)
 Medication Changes:  START DIGOXIN 0.125MG  ONCE DAILY   Lab Work:  Labs done today, your results will be available in MyChart, we will contact you for abnormal readings.  Your provider has recommended that  you wear a Zio Patch for 7 days.  This monitor will record your heart rhythm for our review.  IF you have any symptoms while wearing the monitor please press the button.  If you have any issues with the patch or you notice a red or orange light on it please call the company at [phone].  Once you remove the patch please mail it back to the company as soon as possible so we can get the results.  Follow-Up in: AS SCHEDULED WITH VAD CLINIC   At the Advanced [facility] Failure Clinic, you and your health needs are our priority. We have a designated team specialized in the treatment of Heart Failure. This Care Team includes your primary Heart Failure Specialized Cardiologist (physician), Advanced Practice Providers (APPs- Physician Assistants and Nurse Practitioners), and Pharmacist who all work together to provide you with the care you need, when you need it.   You may see any of the following providers on your designated Care Team at your next follow up:  Dr. Toribio Fuel Dr. Ezra Shuck Dr. Ria Commander Dr. Odis Brownie Greig Mosses, NP Caffie Shed, [place] [facility] [place], [place] Beckey Coe, NP [place] Lee, NP Tinnie Redman, PharmD   Please be sure to bring in all your medications bottles to every appointment.   Need to Contact Us :  If you have any questions or concerns before your next appointment please send us  a message through [place] or call our office at [phone].    TO LEAVE A MESSAGE FOR THE NURSE SELECT OPTION 2, PLEASE LEAVE A MESSAGE INCLUDING: YOUR NAME DATE OF BIRTH CALL BACK NUMBER REASON FOR CALL**this is important as we prioritize the call backs  YOU WILL RECEIVE A CALL BACK THE SAME DAY AS LONG AS YOU CALL BEFORE 4:00 PM

## 2024-08-06 ENCOUNTER — Ambulatory Visit (HOSPITAL_COMMUNITY): Payer: Self-pay | Admitting: Adult Health

## 2024-08-06 LAB — MISC LABCORP TEST (SEND OUT): Labcorp test code: 143000

## 2024-08-21 ENCOUNTER — Ambulatory Visit (HOSPITAL_COMMUNITY)
Admission: RE | Admit: 2024-08-21 | Discharge: 2024-08-21 | Disposition: A | Discharge status: Home / Self Care | Source: Ambulatory Visit | Attending: Internal Medicine | Admitting: Internal Medicine

## 2024-08-21 ENCOUNTER — Other Ambulatory Visit (HOSPITAL_COMMUNITY): Payer: Self-pay | Admitting: *Deleted

## 2024-08-21 ENCOUNTER — Encounter (HOSPITAL_COMMUNITY): Payer: Self-pay | Admitting: Internal Medicine

## 2024-08-21 VITALS — BP 137/86 | HR 100 | Wt 100.4 lb

## 2024-08-21 DIAGNOSIS — Z01818 Encounter for other preprocedural examination: Secondary | ICD-10-CM

## 2024-08-21 DIAGNOSIS — Z7984 Long term (current) use of oral hypoglycemic drugs: Secondary | ICD-10-CM | POA: Diagnosis not present

## 2024-08-21 DIAGNOSIS — I428 Other cardiomyopathies: Secondary | ICD-10-CM | POA: Insufficient documentation

## 2024-08-21 DIAGNOSIS — I5022 Chronic systolic (congestive) heart failure: Secondary | ICD-10-CM

## 2024-08-21 DIAGNOSIS — Z79899 Other long term (current) drug therapy: Secondary | ICD-10-CM | POA: Insufficient documentation

## 2024-08-21 DIAGNOSIS — I080 Rheumatic disorders of both mitral and aortic valves: Secondary | ICD-10-CM | POA: Diagnosis not present

## 2024-08-21 DIAGNOSIS — I050 Rheumatic mitral stenosis: Secondary | ICD-10-CM | POA: Diagnosis not present

## 2024-08-21 DIAGNOSIS — F17210 Nicotine dependence, cigarettes, uncomplicated: Secondary | ICD-10-CM | POA: Diagnosis not present

## 2024-08-21 DIAGNOSIS — Z720 Tobacco use: Secondary | ICD-10-CM

## 2024-08-21 DIAGNOSIS — R002 Palpitations: Secondary | ICD-10-CM

## 2024-08-21 DIAGNOSIS — R5383 Other fatigue: Secondary | ICD-10-CM | POA: Diagnosis not present

## 2024-08-21 DIAGNOSIS — I493 Ventricular premature depolarization: Secondary | ICD-10-CM | POA: Diagnosis not present

## 2024-08-21 DIAGNOSIS — I060 Rheumatic aortic stenosis: Secondary | ICD-10-CM

## 2024-08-21 DIAGNOSIS — R0602 Shortness of breath: Secondary | ICD-10-CM | POA: Diagnosis not present

## 2024-08-21 DIAGNOSIS — I051 Rheumatic mitral insufficiency: Secondary | ICD-10-CM | POA: Diagnosis not present

## 2024-08-21 LAB — COMPREHENSIVE METABOLIC PANEL WITH GFR
ALT: 10 U/L (ref 0–44)
AST: 22 U/L (ref 15–41)
Albumin: 4.7 g/dL (ref 3.5–5.0)
Alkaline Phosphatase: 69 U/L (ref 38–126)
Anion gap: 15 (ref 5–15)
BUN: 12 mg/dL (ref 6–20)
CO2: 24 mmol/L (ref 22–32)
Calcium: 9.9 mg/dL (ref 8.9–10.3)
Chloride: 102 mmol/L (ref 98–111)
Creatinine, Ser: 0.58 mg/dL (ref 0.44–1.00)
GFR, Estimated: >60 mL/min (ref >60)
Glucose, Bld: 83 mg/dL (ref 70–99)
Potassium: 4.8 mmol/L (ref 3.5–5.1)
Sodium: 141 mmol/L (ref 135–145)
Total Bilirubin: 0.6 mg/dL (ref 0.0–1.2)
Total Protein: 8.3 g/dL — ABNORMAL HIGH (ref 6.5–8.1)

## 2024-08-21 LAB — BASIC METABOLIC PANEL WITH GFR
Anion gap: 15 (ref 5–15)
BUN: 12 mg/dL (ref 6–20)
CO2: 24 mmol/L (ref 22–32)
Calcium: 9.7 mg/dL (ref 8.9–10.3)
Chloride: 102 mmol/L (ref 98–111)
Creatinine, Ser: 0.6 mg/dL (ref 0.44–1.00)
GFR, Estimated: >60 mL/min (ref >60)
Glucose, Bld: 82 mg/dL (ref 70–99)
Potassium: 4.7 mmol/L (ref 3.5–5.1)
Sodium: 141 mmol/L (ref 135–145)

## 2024-08-21 LAB — DIGOXIN LEVEL: Digoxin Level: 1.5 ng/mL (ref 0.8–2.0)

## 2024-08-21 LAB — BRAIN NATRIURETIC PEPTIDE: B Natriuretic Peptide: 83.8 pg/mL (ref 0.0–100.0)

## 2024-08-21 MED ORDER — MEXILETINE HCL 150 MG PO CAPS: 150.0000 mg | ORAL_CAPSULE | Freq: Two times a day (BID) | ORAL | 6 refills | Status: AC

## 2024-08-21 MED ORDER — SPIRONOLACTONE 25 MG PO TABS: 25.0000 mg | ORAL_TABLET | Freq: Every day | ORAL | 3 refills | Status: DC

## 2024-08-21 NOTE — Progress Notes (Signed)
 ADVANCED HEART FAILURE CLINIC NOTE  Referring Physician: Lennie Raguel MATSU, DO  Primary Care: Lennie Raguel MATSU, DO Primary Cardiologist: Heart Failure: Ria Commander, DO  CC: HFrEF, Severe MR/AI  HPI: Allison Leonard is a 48 y.o. female with severe MR, AI and HFrEF, NICM, and tobacco abuse.   Pertinent Family & social hx:  - Dad - coronary disease, Aunt died form heart failure.  -  Smoking 5-6 cigarettes a day and marijuana daily. Lives with her Mom and 3 adult children. Middle child has down syndrome.   Cardiac History:  - TTE in 3/23 with LVEF 50-55% and mild AI/MR. Repeat echo in 2/24 with LVEF 50% - Established care with Dr. Barbaraann in 2023 due to tachycardia / SOB.  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  - TEE on 04/28/24 w/ EF 25%, mod-severe MR, mod MS, mod-sev AI.  - Referred to Dr. Shyrl for surgery and subsequently to A[facility] for advanced therapies evaluation.    Interval hx:  - Today she returns for HF follow up. Stressed out because her sister has transitioned to comfort care at [facility]. She tells me her family wants to do everything possible to improve her condition. Complaining fatigue and shortness of breath with exertion. She gets short of breath with minimal exertion.  DeniesPND/Orthopnea. Appetite poor.  No fever or chills.    Taking all medications.   PHYSICAL EXAM: There were no vitals filed for this visit.  Wt Readings from Last 3 Encounters:  08/05/24 47.4 kg (104 lb 9.6 oz)  07/17/24 46.3 kg (102 lb)  07/01/24 47.6 kg (105 lb)    General:   Thin.  No resp difficulty Neck: no JVD.  Cor: Tachy Regular rate & rhythm. +S3 Lungs: clear Abdomen: soft, nontender, nondistended.  Extremities: no  edema Neuro: alert & oriented x3  EKG: SR with occasional PVCs   DATA REVIEW  ECG: 04/07/24: NSR w/ PVCs, LVH  As per my personal interpretation  ECHO: 04/28/24: LVEF 25%, RV mildly reduced. Rheumatic  mitral valve with mod-severe MR and moderate MS. Mod-severe AI.   CATH: 07/09/2024  RA:                  2 mmHg (mean) RV:                  28/2 mmHg PA:                  30/8 mmHg (17 mean) PCWP:            6 mmHg (mean)                                     Estimated Fick CO/CI   3.4 L/min, 2.3 L/min/m2 Thermodilution CO/CI   4 L/min, 2.8 L/min/m2                                              TPG                 11  mmHg  PVR                 2.75-3.2 Wood Units  PAPi                11  Coronary angiography Complicated by severe radial spasm throughout the case.  Short LM with trifurcation into LAD, RM, Lcx. No obstructive CAD visualized. Lcx not well visualized due to radial artery spasm. Dominant RCA with no disease.    IMPRESSION: No coronary artery disease.  Normal to mildly reduced cardiac output / index.  Low filling pressures.  Mildly elevated PVR.    ASSESSMENT & PLAN:  Heart Failure with reduced fraction - Etiology & History: Likely valvular; mod-severe MR/AI 2/2 rheumatic changes. Unable to obtain CMR due to ICA embolization. Cath - no coronary disease, mildly reduced CO/CI. Low filling pressures.  - TEE June 2025 Echo LVEF 25%  previously EF was ~ 55%. In February 2024.  - NYHA Class III Stage C.   Functional decline since the last visit. SABRA - Volume status: Appears euvolemic - GDMT:  -  RAASi: losartan  25mg  daily Beta-blocker: toprol  25mg  daily -  Hydralazine/Nitrates: N/A -  SGLT2i: jardiance  10mg   -Consider spironolactone. - Continue digoxin 0.125 daily - ICD: Evaluating for advanced therapies - Case with Dr. Shyrl who feels that she will be very high risk for x2 valve repair/replacement in the setting of biventricular failure.  Barrier to transplant will be tobacco use. We discussed this extensively. For LVAD, she would require AVR. I am concerned about her MS also. Repair of both valves in the setting of mild RV  dysfunction would make VAD implant high risk also.  Mod-severe AI/MR - see above - TEE on 04/28/24  Moderate MS - mean gradient of  Right ICA Aneurysm - s/p embolization at [facility] in 11/2021  Tobacco use - Encouraged to stop smoking.   Palpitations/PVCs/NSVT  - Zio 10/25: SR avg 102. 26 runs NSVT longest 11 beats. PVCs 5.8%   Toribio Fuel, MD  10:06 AM

## 2024-08-21 NOTE — Progress Notes (Signed)
 Patient presents for VAD consult in VAD Clinic today with her mother Parthenia.   Pt ambulated into clinic today. Reports lightheadedness/dizziness with position changes. Experiencing intermittent chest tightness that she attributes to stress with the recent passing of her sister. States she experiences nausea/vomiting most mornings. She is short of breath with minimal activity, and has to take it slow with frequent rest breaks. She lives with her mom and 3 adult children. Her middle son has Down's syndrome, and she is his primary caretaker. She is currently smoking 5-6 cigarettes daily. She is willing to quit, but right now with the stress of the death of her sister smoking is helping her cope.   Reports palpitations/heart racing all the time. Placed pt on monitor in clinic room. ST 100 with frequent PVCs. Recent Zio patch report reviewed by Dr Cherrie. Per Dr Cherrie pt to start Mexilitine 150 mg BID. Prescription sent to pt's pharmacy. Pt and mother verbalized understanding of new medication.   BP mildly elevated today. Per Dr Cherrie start Spironolactone 12.5 mg daily. Prescription sent to pt's pharmacy. Pt and mother verbalized understanding of new medication. Will repeat BMET/BNP next week per Dr Cherrie.   Dr Cherrie had long discussion with pt and mother regarding advanced therapy options. She is not currently a transplant candidate due to smoking. Her aortic valve and mitral valve have significant regurgitation- discussed possible high risk valve replacement at [facility]. Discussed possible LVAD implant. See below for documentation. Pt and mother agreeable to moving forward with VAD evaluation at this time.   Per Dr Bensimhon will schedule pt for CPX to further determine potential surgical plan. May need to repeat RHC.   MCS EDUCATION NOTE:                VAD evaluation consent reviewed and signed by Tedi and designated caregiver Parthenia (mother).  Initial VAD teaching  completed with pt and caregiver.   VAD educational packet including Understanding Your Options with Advanced Heart Failure, [place] Patient Agreement for VAD Evaluation and Potential Implantation consent, and Abbott Heartmate 3 Left Ventricular Device (LVAD) Patient Guide, [place] HM III Patient Education, [place] Mechanical Circulatory Support Program, and Decision Aids for Left Ventricular Assist Device reviewed in detail and left at bedside for continued reference.   All questions answered regarding VAD implant, hospital stay, and what to expect when discharged home living with a heart pump. Pt identified mom Parthenia as her primary caregiver. Explained need for 24/7 care when pt is discharged home due to sternal precautions, adaptation to living on support, emotional support, consistent and meticulous exit site care and management, medication adherence and high volume of follow up visits with the VAD Clinic after discharge; both pt and caregiver verbalized understanding of above.   Explained that LVAD can be implanted for two indications in the setting of advanced left ventricular heart failure treatment:  Bridge to transplant - used for patients who cannot safely wait for heart transplant without this device.  Or    Destination therapy - used for patients until end of life or recovery of heart function.  Patient and caregiver(s) acknowledge that the indication at this point in time for LVAD therapy would be for destination therapy due to smoking.   Provided brief equipment overview and demonstration with HeartMate III training loop including discussion on the following:   a) mobile power unit b) system controller   c) universal Magazine features editor   d) battery clips   e) Batteries  f)  Perc lock   g) Percutaneous lead    Reviewed and supplied a copy of home inspection check list stressing that only three pronged grounded power outlets can be used for VAD equipment.  Analucia confirmed home has electrical outlets that will support the equipment along with access working telephone.  Identified the following lifestyle modifications while living on MCS:    1. No driving for at least three months and then only if doctor gives permission to do so.   2. No tub baths while pump implanted, and shower only when doctor gives permission.   3. No swimming or submersion in water  while implanted with pump.   4. No contact sports or engaging in jumping activities.   5. Always have a backup controller, charged spare batteries, and battery clips nearby at all times in case of emergency.   6. Call the doctor or hospital contact person if any change in how the pump sounds, feels, or works.   7. Plan to sleep only when connected to the power module.   8. Do not sleep on your stomach.   9. Keep a backup system controller, charged batteries, battery clips, and flashlight near you during sleep in case of electrical power outage.   10. Exit site care including dressing changes, monitoring for infection, and importance of keeping percutaneous lead stabilized at all times.     Extended the option to have one of our current patients and caregiver(s) come to talk with them about living on support to assist with decision making. Both are agreeable.   Reviewed pictures of VAD drive line, site care, dressing changes, and drive line stabilization including securement attachment device and abdominal binder. Discussed with pt and family that they will be required to purchase dressing supplies as long as patient has the VAD in place.   Reinforced need for 24 hour/7 day week caregivers.  She will also need to abide by sternal precautions with no lifting >10lbs, pushing, pulling and will need assistance with adapting to new life style with VAD equipment and care. Pt does not drive. Her mother will provide transportation to all her appts.   [facility]termacs patient survival statistics through June  2025 reviewed with patient and caregiver as follows:    The patient understands that from this discussion it does not mean that they will receive the device, but that depends on an extensive evaluation process. The patient is aware of the fact that if at anytime they want to stop the evaluation process they can.  All questions have been answered at this time and contact information was provided should they encounter any further questions.  They are both agreeable at this time to the evaluation process and will move forward.     Vital Signs:  HR: 100 w/ frequent PVCs BP: 137/86 (96) SPO2: 97 %   Weight: 100.4 lb  Last weight: 105 lb Home weights:  lbs   Symptom YES NO DETAILS  Angina x  Activity: intermittent chest tightness- resolves on own  Claudication  x How Far:  Syncope  x When: lightheadness/dizziness with position changes  Stroke  x   Orthopnea x  How many pillows:  PND   How often:  CPAP   How many hours:  Pedal Edema  x   Abdominal Fullness x    Nausea / Vomit x    Diaphoresis  x When:  Shortness of Breath x  Activity: with minimal activity  Palpitations x  When: all the time;  especially when laying flat  ICD shock     Bleeding S/S  x   Tea-colored Urine  x   Hospitalizations  x   Emergency Room  x   Other MD     Activity Able to complete ADLs, grocery shopping, and chores. Takes it slow  Fluid   Diet Feels full most of the time. Down 5 lbs today. ? Due to stress with death of her sister    Patient Instructions:  Start Mexiletine 150 mg twice daily Start Spironolactone 12.5 mg daily We will schedule you for an exercise test (CPX) and call you with appointment info Repeat labs next week Return to VAD clinic in 1 month for follow up with Dr Bensimhon If you are feeling unwell, or have questions, prior to your next appointment please call VAD coordinators [phone]   Isaiah Knoll RN VAD Coordinator  Office: [phone]  24/7 Pager: [phone]

## 2024-08-21 NOTE — Patient Instructions (Signed)
 Start Mexiletine 150 mg twice daily Start Spironolactone 12.5 mg daily We will schedule you for an exercise test (CPX) and call you with appointment info Repeat labs next week Return to VAD clinic in 1 month for follow up with Dr Bensimhon If you are feeling unwell, or have questions, prior to your next appointment please call VAD coordinators [phone]

## 2024-08-24 ENCOUNTER — Encounter (HOSPITAL_COMMUNITY): Payer: Self-pay | Admitting: *Deleted

## 2024-08-24 ENCOUNTER — Telehealth (HOSPITAL_COMMUNITY): Payer: Self-pay | Admitting: *Deleted

## 2024-08-24 ENCOUNTER — Telehealth: Payer: Self-pay | Admitting: Pharmacy Technician

## 2024-08-24 ENCOUNTER — Other Ambulatory Visit (HOSPITAL_COMMUNITY): Payer: Self-pay

## 2024-08-24 ENCOUNTER — Telehealth (HOSPITAL_COMMUNITY): Payer: Self-pay | Admitting: Licensed Clinical Social Worker

## 2024-08-24 NOTE — Telephone Encounter (Signed)
 Spoke with pt about VAD eval testing appt schedule. Letter sent in MyChart with schedule. Reviewed schedule with pt. She verbalized understanding. All questions answered at this time.   Isaiah Knoll RN VAD Coordinator  Office: [phone]  24/7 Pager: [phone]

## 2024-08-24 NOTE — Telephone Encounter (Signed)
 Pharmacy Patient Advocate Encounter   Received notification from CoverMyMeds that prior authorization for AJOVY  225MG  is required/requested.   Insurance verification completed.   The patient is insured through [employer].   Per test claim: PA required; PA submitted to above mentioned insurance via Latent Key/confirmation #/EOC [id number] Status is pending

## 2024-08-24 NOTE — Telephone Encounter (Signed)
 Pharmacy Patient Advocate Encounter  Received notification from [employer] that Prior Authorization for AJOVY  225MG  has been APPROVED from 10.20.25 to 10.20.26. Ran test claim, Copay is $4. This test claim was processed through [facility]- copay amounts may vary at other pharmacies due to pharmacy/plan contracts, or as the patient moves through the different stages of their insurance plan.   PA #/Case ID/Reference #: [id number]

## 2024-08-24 NOTE — Telephone Encounter (Signed)
 H&V Care Navigation CSW Progress Note  Clinical Social Worker called pt to set up appt for LVAD Psychosocial Evaluation.  Will plan to meet at 11:30am on 10/23 to complete evaluation- confirmed that her caregiver (her mother) would also be attending the appt.  Andriette HILARIO Leech, LCSW Clinical Social Worker Advanced [facility] Failure Clinic Desk#: [phone] Cell#: [phone]

## 2024-08-26 ENCOUNTER — Other Ambulatory Visit (HOSPITAL_COMMUNITY): Payer: Self-pay | Admitting: *Deleted

## 2024-08-26 DIAGNOSIS — Z01818 Encounter for other preprocedural examination: Secondary | ICD-10-CM

## 2024-08-26 DIAGNOSIS — I5022 Chronic systolic (congestive) heart failure: Secondary | ICD-10-CM

## 2024-08-27 ENCOUNTER — Ambulatory Visit (HOSPITAL_BASED_OUTPATIENT_CLINIC_OR_DEPARTMENT_OTHER)
Admission: RE | Admit: 2024-08-27 | Discharge: 2024-08-27 | Disposition: A | Discharge status: Home / Self Care | Source: Ambulatory Visit | Attending: Internal Medicine | Admitting: Internal Medicine

## 2024-08-27 ENCOUNTER — Ambulatory Visit (HOSPITAL_COMMUNITY)
Admission: RE | Admit: 2024-08-27 | Discharge: 2024-08-27 | Disposition: A | Discharge status: Home / Self Care | Source: Ambulatory Visit | Attending: Internal Medicine | Admitting: Internal Medicine

## 2024-08-27 DIAGNOSIS — Z01818 Encounter for other preprocedural examination: Secondary | ICD-10-CM

## 2024-08-27 DIAGNOSIS — F17290 Nicotine dependence, other tobacco product, uncomplicated: Secondary | ICD-10-CM | POA: Diagnosis not present

## 2024-08-27 DIAGNOSIS — I5022 Chronic systolic (congestive) heart failure: Secondary | ICD-10-CM

## 2024-08-27 DIAGNOSIS — I080 Rheumatic disorders of both mitral and aortic valves: Secondary | ICD-10-CM | POA: Insufficient documentation

## 2024-08-27 LAB — CBC
HCT: 45.9 % (ref 36.0–46.0)
Hemoglobin: 14.1 g/dL (ref 12.0–15.0)
MCH: 26.5 pg (ref 26.0–34.0)
MCHC: 30.7 g/dL (ref 30.0–36.0)
MCV: 86.3 fL (ref 80.0–100.0)
Platelets: 202 K/uL (ref 150–400)
RBC: 5.32 MIL/uL — ABNORMAL HIGH (ref 3.87–5.11)
RDW: 14.8 % (ref 11.5–15.5)
WBC: 7.8 K/uL (ref 4.0–10.5)
nRBC: 0 % (ref 0.0–0.2)

## 2024-08-27 LAB — URINALYSIS, ROUTINE W REFLEX MICROSCOPIC
Bacteria, UA: NONE SEEN
Bilirubin Urine: NEGATIVE
Glucose, UA: >=500 mg/dL — AB
Hgb urine dipstick: NEGATIVE
Ketones, ur: NEGATIVE mg/dL
Leukocytes,Ua: NEGATIVE
Nitrite: NEGATIVE
Protein, ur: NEGATIVE mg/dL
Specific Gravity, Urine: 1.021 (ref 1.005–1.030)
pH: 7 (ref 5.0–8.0)

## 2024-08-27 LAB — HEPATITIS B SURFACE ANTIGEN: Hepatitis B Surface Ag: NONREACTIVE

## 2024-08-27 LAB — BASIC METABOLIC PANEL WITH GFR
Anion gap: 14 (ref 5–15)
BUN: 14 mg/dL (ref 6–20)
CO2: 25 mmol/L (ref 22–32)
Calcium: 9.8 mg/dL (ref 8.9–10.3)
Chloride: 97 mmol/L — ABNORMAL LOW (ref 98–111)
Creatinine, Ser: 0.6 mg/dL (ref 0.44–1.00)
GFR, Estimated: >60 mL/min (ref >60)
Glucose, Bld: 97 mg/dL (ref 70–99)
Potassium: 4 mmol/L (ref 3.5–5.1)
Sodium: 136 mmol/L (ref 135–145)

## 2024-08-27 LAB — RAPID URINE DRUG SCREEN, HOSP PERFORMED
Amphetamines: NOT DETECTED
Barbiturates: NOT DETECTED
Benzodiazepines: NOT DETECTED
Cocaine: NOT DETECTED
Opiates: NOT DETECTED
Tetrahydrocannabinol: POSITIVE — AB

## 2024-08-27 LAB — HEPATITIS B SURFACE ANTIBODY,QUALITATIVE: Hep B S Ab: NONREACTIVE

## 2024-08-27 LAB — APTT: aPTT: 29 s (ref 24–36)

## 2024-08-27 LAB — LACTATE DEHYDROGENASE: LDH: 128 U/L (ref 98–192)

## 2024-08-27 LAB — LIPID PANEL
Cholesterol: 241 mg/dL — ABNORMAL HIGH (ref 0–200)
HDL: 105 mg/dL (ref >40)
LDL Cholesterol: 123 mg/dL — ABNORMAL HIGH (ref 0–99)
Total CHOL/HDL Ratio: 2.3 ratio
Triglycerides: 63 mg/dL (ref <150)
VLDL: 13 mg/dL (ref 0–40)

## 2024-08-27 LAB — HEMOGLOBIN A1C
Hgb A1c MFr Bld: 5.2 % (ref 4.8–5.6)
Mean Plasma Glucose: 102.54 mg/dL

## 2024-08-27 LAB — HEPATITIS C ANTIBODY: HCV Ab: NONREACTIVE

## 2024-08-27 LAB — PROTIME-INR
INR: 0.9 (ref 0.8–1.2)
Prothrombin Time: 12.9 s (ref 11.4–15.2)

## 2024-08-27 LAB — MAGNESIUM: Magnesium: 2 mg/dL (ref 1.7–2.4)

## 2024-08-27 LAB — VAS US DOPPLER PRE VAD

## 2024-08-27 LAB — HIV ANTIBODY (ROUTINE TESTING W REFLEX): HIV Screen 4th Generation wRfx: REACTIVE — AB

## 2024-08-27 LAB — ABO/RH: ABO/RH(D): O POS

## 2024-08-27 LAB — PREALBUMIN: Prealbumin: 26 mg/dL (ref 18–38)

## 2024-08-27 LAB — T4, FREE: Free T4: 0.84 ng/dL (ref 0.61–1.12)

## 2024-08-27 LAB — URIC ACID: Uric Acid, Serum: 2.6 mg/dL (ref 2.5–7.1)

## 2024-08-27 LAB — ANTITHROMBIN III: AntiThromb III Func: 128 % — ABNORMAL HIGH (ref 75–120)

## 2024-08-27 LAB — BRAIN NATRIURETIC PEPTIDE: B Natriuretic Peptide: 34.1 pg/mL (ref 0.0–100.0)

## 2024-08-27 NOTE — Progress Notes (Incomplete)
HIV

## 2024-08-27 NOTE — Addendum Note (Signed)
 Encounter addended by: Elza Lauraine NOVAK, RN on: 08/27/2024 12:16 PM  Actions taken: Order list changed, Diagnosis association updated

## 2024-08-27 NOTE — Progress Notes (Signed)
 VASCULAR LAB    Pre VAD Dopplers have been performed.  See CV proc for preliminary results.   Shahzad Thomann, RVT 08/27/2024, 10:34 AM

## 2024-08-27 NOTE — Progress Notes (Signed)
 VASCULAR LAB    Bilateral lower extremity venous duplex has been performed.  See CV proc for preliminary results.   Laasya Peyton, RVT 08/27/2024, 10:33 AM

## 2024-08-27 NOTE — Progress Notes (Signed)
 LVAD Initial Psychosocial Screening  Date/Time Initiated:  10:45am Referral Source:  LVAD Coordinators Referral Reason:  LVAD Initial Psychosocial Screening Source of Information:  patient  Demographics Name:  Allison Leonard Address:  [address], [place], [place] [address] Home phone:  [phone] (home)    Marital Status: Single  Faith:  does not identify with faith Primary Language:  English Last 4 # SS: 1967  DOB: 1976/04/18   Psychological Health Appearance:  Unremarkable Mental Status:  Alert, oriented Eye Contact:  Good Thought Content:  Coherent Speech:  Logical/coherent Mood:  Depressed and Appropriate  Affect:  Appropriate to circumstance and Depressed Insight:  Good Judgement: Unimpaired Interaction Style:  Engaged   Family/Social Information Who lives in your home? Name: Allison Leonard  Relationship: Mother Allison Leonard       27yo Daughter Allison Leonard      24yo son Allison Leonard       22yo son    Other family members/support persons in your life? Name: Allison Leonard  Relationship: Brother (lives in [place]) Allison Leonard    Twin brother (lives in [place])   Community Are you active with community agencies/resources/homecare? No  Are you active in a church, synagogue, mosque or other faith based community? No  Do you have other sources do you have for spiritual support? no Are you active in any clubs or social organizations? no What do you do for fun?  Hobbies?  Interests? Normally enjoys doing Insurance risk surveyor and cooking   Home Environment/Personal Care Do you have reliable phone service? Yes  If so, what is the number?  [phone] Do you own or rent your home? rent Number of steps into the home? 2 on front porch How many levels in the home? 1 Electrical needs for LVAD (3 prong outlets)? yes Second hand smoke exposure in the home? Mother smokes Travel distance from [place]? 8-9 minutes   Financial Information What is your source of income? Has no  income herself- Dtr, Mom, and oldest son all have SSI benefits which support the household. Do you have difficulty meeting your monthly expenses? No How do you cope with this? N/a Can you budget for the monthly cost for dressing supplies post procedure? Yes   Primary Health insurance:  Healthy [facility] Have you ever had to refuse medication due to cost?  No Do you use mail order for your prescriptions?  No Have you applied for Social Security Disability/(SSI)  July 2025   Education/Work Information What is the last grade of school you completed? 10th Do you have any problems with reading or writing?  No Preferred method of learning?  Written and Hands on Are you currently employed?  No   If not currently employed when did you last work? 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  Did you serve in the military? No    Advance Directives: Do you have a Living Will or Medical POA? No  Would you like to complete a Living Will and Medical POA prior to surgery?  Yes- has paperwork at home to complete   Legal Do you currently have any legal issues/problems?  no  Medical Information Do you have any family history of heart problems? Yes multiple family members have died due to heart concerns Do you have a PCP or other medical provider? Reports yes but can't remember who. Are you able to complete your ADL's?  yes Do you have any assistive devices  in the home? no How are you currently managing your medications at home? Fills up a pillbox How many hours do you sleep at night? 4-5 total- has trouble falling asleep and trouble staying asleep How is your appetite? Poor appetite Do you smoke now or past usage? Yes and past usage    Quit date: had quit for about a year up until August when she started smoking again due to stress.  Continues to smoke 10 or less cigarettes/day- working on quitting again- provided with information about [facility] quit support Do you drink alcohol now or past usage? past usage    Quit date:  20 years ago- only drank socially before that but has not consumed alcohol at all since that time. Are you currently using illegal drugs or misuse of medication or past usage? Yes - admits to smoking pot 2-3 times a week to deal with recent stressors Have you ever been treated for substance abuse? No  Mental Health History How have you been feeling in the past year? Has had a very difficult last few months.  States her sister died at the beginning of this month and she had been sick prior to that.  Patient provided caregiving to her sister while she was sick (colon cancer) and reports they were very close so has had trouble dealing with her loss on top of medical concerns.  PHQ2 Depression Scale: 5   Attributes most of this depression to the grief from losing her sister.  States she has started trying to do things that brings her joy again but struggles to as it reminds her of her sister.  Agreeable to engaging with grief counseling- provided her with number to Bereavement counseling through [facility]- she will plan to call and complete assessment.  Also interested in medication management particularly to assist with sleep- informed LVAD Coordinators and patient will plan to call PCP office to ask if they could prescribe.  Have you ever had any problems with depression, anxiety or other mental health concerns? Has no past concerns with mental health diagnoses. Have you or are you taking medications for anxiety/depression or any mental health concerns?  No  Do you have a history of a traumatic event? Loss of her sister Have you had any past or current thoughts of suicide? no Are there any other current stressors in your life?  no Do you see a counselor, psychiatrist or therapist?  no If you are currently experiencing problems are you interested in talking with a professional? Yes- referred for grief  counseling What are your coping strategies under stressful situations? States she will complete diamond art to distract her or go out on her porch to watch the cars pass by.  Also states she will speak with her daughter or her mom who are both good sources of emotional support.  Would you be interested in attending the LVAD support group? potentially  Medical & Follow-up Do you take your medications as prescribed by the doctors? yes Do you feel as if you have a good understanding of your medications and what they are for? yes No Show Rate Percentage:18%  Plan for VAD Implementation Do you know and understand what happens during the VAD surgery? Patient Verbalizes Understanding  of surgery and able to describe details What do you know about the risks and side effect associated with VAD surgery? Patient Verbalizes Understanding  of risks (infection, stroke and death) Explain what will happen right after surgery: Patient Verbalizes Understanding  of  OR to ICU and will be intubated  What is your plan for transportation for the first 8 weeks post-surgery? (Patients are not recommended to drive post-surgery for 8 weeks)  Driver: Allison Ellen Do you have airbags in your vehicle?  There is a risk of discharging the device if the airbag were to deploy. yes What do you know about your diet post-surgery? Patient Verbalizes Understanding  of Heart healthy How do you plan to complete ADL's post-surgery?  With help from caregivers Will it be difficult to ask for help from your caregivers?  no  Please explain what you hope will be improved about your life as a result of receiving the LVAD? Hopeful for more energy. Please tell me your biggest concern or fear about living with the LVAD?  Worried about the electricity going off in the middle of the night- discussed importance of emergency planning to be prepared for instances like this. Please explain your understanding of how your body will change?   Understands she would have driveline and have to carry controller and batteries everywhere she goes. Are you worried about these changes? Is worried about how she will carry around equipment. Do you see any barriers to your surgery or follow-up? no  Understanding of LVAD Patient states understanding of the following: Surgical procedures and risks, Electrical need for LVAD (3 prong outlets), Safety precautions with LVAD (water , etc.), LVAD daily self-care (dressing changes, computer check, extra supplies), Outpatient follow up (LVAD clinic appts, monitoring blood thinners), and Need for Emergency Planning  Discussed and Reviewed with Patient and Caregiver  Patient's current level of motivation to prepare for LVAD:  Patient's present Level of Consent for LVAD:    Caregiving Needs- reported by patient Who is the primary caregiver? Allison Leonard Health status:  good Do you drive?  yes Do you work?  no Physical Limitations:  no Do you have other care giving responsibilities?  Assist in caring for Allison Leonard's son, Allison Leonard, who has Down Syndrome and requires help with medication management/ Contact number:  Who is the secondary caregiver? Think she would ask Allison Leonard her daughter to be trained as secondary caregiver.  Unable to complete caregiver assessment at this time as mother was unable to attend.   Clinical Interventions Needed:     CSW will monitor signs and symptoms of depression and assist with adjustment to life with an LVAD.  CSW will refer patient for Advanced Directives, if not completed prior to surgery if still wishing to complete.  CSW encouraged attendance with the LVAD Support Group to assist further with adjustment and post implant peer support.   Clinical Impressions/Recommendations:    CSW met with Allison Leonard in clinic to complete Initial LVAD Psychosocial Assessment.  Allison Leonard presented as alert and oriented throughout interview.  She was engaged in  interview and answered questions appropriately but her affect appeared depressed and subdued.  Allison Leonard is a 45 year woman living in [place] with her mom and her 3 adult children.  She has not worked in about 25 years as she stopped working to take care of her children including her middle child, Allison Leonard, who has Down Syndrome and requires caregiving assistance.  Though Allison Leonard does not work she reports no concerns with household expenses being upkept as her mom, her 27yo daughter, and her 75 year old son all receive SSI benefits which she states covers all expenses.  Allison Leonard has also applied for SSI benefits as of July of 2025 due to her  medical condition at this time.  She has no concerns with paying for medical expenses as she has [facility] and reports copays are manageable at this time.  Allison Leonard admits to struggling with feelings of depression at this time which affect her most days.  She states she lost her sister earlier this month to colon cancer and this has been very difficult for her.  She states she and her sister were very close and did many activities together so her passing has been very difficult to process.  On top of this Allison Leonard has been worrying about her own health and the prospect of getting an LVAD which has been difficult for her.  She reports prior to these events she had no concerns of depression and had never had mental health treatment.  Agrees that she needs counseling at this time and will plan to reach out for bereavement counseling through [facility] which CSW provided her.  She is interested in potential medication management and will plan to reach out to her PCP to discuss this.  Though she had quit smoking cigarettes and marijuana for almost a year she admits to relapsing on both a few months ago when things escalated with her sister.  She states she now smokes pot 2-3 times a week and smokes 10 or less cigarettes a day.  States  last time she just quit after hearing its affect on her lungs and is hopeful to be able to quit on her own this time too.  Discussed potential benefit of assistance as this is a very stressful time for her and might be more difficult to quit- she was agreeable to CSW providing with resources.  Reports no significant history of alcohol use and hasn't a drink in about 20 years.   Allison Leonard reports strong social support from her household.  States her mom would be her primary caregiver.  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  States they have a car and mom has no commitment outside the house that would prevent her from bringing her to appointments.  Allison Leonard and her mom do provide caregiving to her son with down syndrome but state that her daughter can also watch out for him when the mom needs to come for training or to bring patient to appointments.  Allison Leonard reports some concerns with getting LVAD particularly anxiety about being connected to batteries but is hopeful that an LVAD would make her feel better and provide her with more energy as she has been feeling so poorly lately.  At this time she is prepared and motivated to proceed with LVAD if that is her best option at this time.  CSW will plan to meet with Ms. Jungbluth and her mom next week following CPX test to complete caregiver assessment.  Will plan to touch base with her regarding mental health concerns and provide support as needed.  Allison HILARIO Leech, LCSW Clinical Social Worker [facility] Desk#: [phone] Cell#: [phone]

## 2024-08-28 ENCOUNTER — Encounter (HOSPITAL_COMMUNITY): Payer: Self-pay | Admitting: Internal Medicine

## 2024-08-28 ENCOUNTER — Telehealth (HOSPITAL_COMMUNITY): Payer: Self-pay | Admitting: *Deleted

## 2024-08-28 ENCOUNTER — Encounter (HOSPITAL_COMMUNITY): Payer: Self-pay

## 2024-08-28 ENCOUNTER — Other Ambulatory Visit: Payer: Self-pay

## 2024-08-28 DIAGNOSIS — K5909 Other constipation: Secondary | ICD-10-CM

## 2024-08-28 DIAGNOSIS — R1013 Epigastric pain: Secondary | ICD-10-CM

## 2024-08-28 LAB — LUPUS ANTICOAGULANT PANEL
DRVVT: 27.3 s (ref 0.0–47.0)
PTT Lupus Anticoagulant: 32.2 s (ref 0.0–43.5)

## 2024-08-28 LAB — HEPATITIS B CORE ANTIBODY, TOTAL: HEP B CORE AB: NEGATIVE

## 2024-08-28 LAB — T3, FREE: T3, Free: 3.6 pg/mL (ref 2.0–4.4)

## 2024-08-28 NOTE — Telephone Encounter (Signed)
 Returned call to patient re: positive HIV results that she noted in MyChart.  Informed patient that test results have been discussed with Dr. Bensimhon. Results will need to be confirmed with second HIV test which is currently in process. We will call you when second test results are available.

## 2024-08-29 ENCOUNTER — Other Ambulatory Visit (HOSPITAL_COMMUNITY): Payer: Self-pay | Admitting: Adult Health

## 2024-08-30 LAB — HIV-1/2 AB - DIFFERENTIATION
HIV 1 Ab: NONREACTIVE
HIV 2 Ab: NONREACTIVE
Note: NEGATIVE

## 2024-08-30 LAB — HIV-1/HIV-2 QUALITATIVE RNA
Final Interpretation: NEGATIVE
HIV-1 RNA, Qualitative: NONREACTIVE
HIV-2 RNA, Qualitative: NONREACTIVE

## 2024-08-31 NOTE — Telephone Encounter (Signed)
 Addressed via telephone by [facility] RN

## 2024-09-01 ENCOUNTER — Telehealth (HOSPITAL_COMMUNITY): Payer: Self-pay | Admitting: Unknown Physician Specialty

## 2024-09-01 NOTE — Telephone Encounter (Signed)
 Called to inform pt that HIV results were negative. LVOM.  Lauraine Ip RN, BSN VAD Coordinator 24/7 Pager [phone]

## 2024-09-02 ENCOUNTER — Telehealth (HOSPITAL_COMMUNITY): Payer: Self-pay | Admitting: Licensed Clinical Social Worker

## 2024-09-02 LAB — FACTOR 5 LEIDEN

## 2024-09-02 NOTE — Telephone Encounter (Signed)
 H&V Care Navigation CSW Progress Note  Clinical Social Worker called pt to check in and confirm plan to meet briefly tomorrow.  She confirms she will be coming to appt and plan to have her mom with her at that time so we can complete caregiver assessment.  CSW also inquired if she had been able to follow up with [facility]- confirms she completed assessment and has upcoming appointment.  CSW will continue to follow and assist as needed  Allison HILARIO Leech, LCSW Clinical Social Worker [facility] Desk#: [phone] Cell#: [phone]

## 2024-09-03 ENCOUNTER — Telehealth (HOSPITAL_COMMUNITY): Payer: Self-pay | Admitting: *Deleted

## 2024-09-03 ENCOUNTER — Telehealth (HOSPITAL_COMMUNITY): Payer: Self-pay | Admitting: Licensed Clinical Social Worker

## 2024-09-03 ENCOUNTER — Ambulatory Visit (HOSPITAL_COMMUNITY)

## 2024-09-03 ENCOUNTER — Ambulatory Visit (HOSPITAL_COMMUNITY)
Admission: RE | Admit: 2024-09-03 | Discharge: 2024-09-03 | Disposition: A | Discharge status: Home / Self Care | Source: Ambulatory Visit | Attending: Internal Medicine | Admitting: Internal Medicine

## 2024-09-03 ENCOUNTER — Telehealth (HOSPITAL_COMMUNITY): Payer: Self-pay

## 2024-09-03 DIAGNOSIS — Z01818 Encounter for other preprocedural examination: Secondary | ICD-10-CM | POA: Diagnosis present

## 2024-09-03 DIAGNOSIS — I5022 Chronic systolic (congestive) heart failure: Secondary | ICD-10-CM

## 2024-09-03 NOTE — Telephone Encounter (Signed)
-----   Message from Nurse Isaiah DEL sent at 08/21/2024  4:55 PM EDT ----- Regarding: Prior Auth Hi-   Pt undergoing LVAD evaluation testing. Will she need prior authorization for non-contrast head/chest/abd/pelvis CTs?   Thanks, Isaiah

## 2024-09-03 NOTE — Addendum Note (Signed)
 Addended by: BERDINE RAKE B on: 09/03/2024 03:21 PM   Modules accepted: Orders

## 2024-09-03 NOTE — Telephone Encounter (Signed)
 Scheduled pt's CT scans at [facility] 09/08/24 at 1430. Per radiology scheduling pt will have to drink oral contrast because she is less than 125 lbs.   Called pt and made aware of the above. Advised she also needs to complete panorex scan. She verbalized understanding to all the above.  Isaiah Knoll RN VAD Coordinator  Office: [phone]  24/7 Pager: [phone]

## 2024-09-03 NOTE — Telephone Encounter (Signed)
 CSW met with pt and pt mom in clinic inbetween appointments   Pt mom, Parthenia, identified as primary caregiver.  Caregiver questions Please explain what you hope will be improved about your life and loved one's life as a result of receiving the LVAD? Hopeful to extend her life and provide her higher qualify of life. What is your biggest concern or fear about caregiving with an LVAD patient? none   What is your plan for availability to provide care 24/7 x2 weeks post op and dressing changes ongoing?Does not work or have commitment outside the home so will provide care alongside patients daughter who also lives there.  Mom will be training on dressing changes. Who is the relief/backup caregiver and what is their availability? Patients daughter, Kayla, would be secondary caregiver and also does not work so would be available as needed.  Preferred method of learning? Hands on   Do you drive? yes How do you handle stressful situations? 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 Do you think you can do this? yes Is there anything that concerns about caregiving? no  Do you provide caregiving to anyone else? Patients son who had down syndrome.  They provide caregiving/supervision to him as a family so Ileana would be ale to supervise him while caregiver is getting training.     Caregiver's current level of motivation to prepare for LVAD: fully motivated Caregiver's present level of consent for LVAD: no concerns gives full consent.  CSW will continue to work with patient and family during LVAD work up   Andriette HILARIO Leech, LCSW Clinical Social Worker [facility] Desk#: [phone] Cell#: [phone]

## 2024-09-04 DIAGNOSIS — I5022 Chronic systolic (congestive) heart failure: Secondary | ICD-10-CM | POA: Diagnosis not present

## 2024-09-07 ENCOUNTER — Ambulatory Visit (HOSPITAL_COMMUNITY): Admission: RE | Admit: 2024-09-07 | Source: Ambulatory Visit

## 2024-09-07 ENCOUNTER — Encounter (HOSPITAL_COMMUNITY): Payer: Self-pay | Admitting: Internal Medicine

## 2024-09-08 ENCOUNTER — Inpatient Hospital Stay (HOSPITAL_COMMUNITY): Admission: RE | Admit: 2024-09-08 | Source: Ambulatory Visit

## 2024-09-08 ENCOUNTER — Telehealth (HOSPITAL_COMMUNITY): Payer: Self-pay | Admitting: *Deleted

## 2024-09-08 ENCOUNTER — Ambulatory Visit (HOSPITAL_COMMUNITY)

## 2024-09-08 ENCOUNTER — Encounter (HOSPITAL_COMMUNITY): Payer: Self-pay

## 2024-09-08 NOTE — Telephone Encounter (Addendum)
 Received call from PFT lab reporting pt did not attend PFT appt on Monday, and canceled her rescheduled PFT appt today. Sherry RT said pt told her she is not sure she wants to pursue VAD eval anymore. Scheduled for CT scans at [facility] this afternoon- she has not checked in for this appt (was suppose to arrive at 1430 to drink oral contrast).   Attempted to call pt with no answer. Left voicemail for pt requesting call back to discuss if she wishes to continue VAD evaluation as she has canceled/no showed these appts. She was scheduled to see Dr Daniel on Thursday 11/6. Discussed with [facility] office- appt canceled as PFTs and CT scans have not been completed. Will reschedule once PFTs & CT scans are done if pt wishes to continue evaluation.  Isaiah Knoll RN VAD Coordinator  Office: [phone]  24/7 Pager: [phone]

## 2024-09-09 ENCOUNTER — Telehealth (HOSPITAL_COMMUNITY): Payer: Self-pay | Admitting: *Deleted

## 2024-09-09 NOTE — Telephone Encounter (Signed)
 Called and spoke with pt regarding missed tests yesterday. States her chest hurts when she takes a deep breath, and she does not feel that she can participate properly in PFTs. Explained that due to not having PFTs/CT scans completed her appt with Dr Daniel has been canceled, and she will need to complete testing prior to appt being rescheduled. Pt has follow up with Dr Cherrie scheduled 09/18/24. Will discuss further VAD workup/candidacy at this appt. Advised to notify VAD coordinators if she is feeling unwell and needs to be seen prior to that appt. She verbalized understanding to all the above.   Isaiah Knoll RN VAD Coordinator  Office: [phone]  24/7 Pager: [phone]

## 2024-09-10 ENCOUNTER — Encounter

## 2024-09-14 ENCOUNTER — Other Ambulatory Visit: Payer: Self-pay | Admitting: Gastroenterology

## 2024-09-14 DIAGNOSIS — R1013 Epigastric pain: Secondary | ICD-10-CM

## 2024-09-18 ENCOUNTER — Encounter (HOSPITAL_COMMUNITY): Payer: Self-pay | Admitting: Internal Medicine

## 2024-09-18 ENCOUNTER — Ambulatory Visit (HOSPITAL_COMMUNITY)
Admission: RE | Admit: 2024-09-18 | Discharge: 2024-09-18 | Disposition: A | Discharge status: Home / Self Care | Source: Ambulatory Visit | Attending: Internal Medicine | Admitting: Internal Medicine

## 2024-09-18 ENCOUNTER — Other Ambulatory Visit (HOSPITAL_COMMUNITY): Payer: Self-pay | Admitting: *Deleted

## 2024-09-18 VITALS — BP 132/80 | HR 99 | Wt 97.0 lb

## 2024-09-18 DIAGNOSIS — I428 Other cardiomyopathies: Secondary | ICD-10-CM | POA: Diagnosis not present

## 2024-09-18 DIAGNOSIS — I080 Rheumatic disorders of both mitral and aortic valves: Secondary | ICD-10-CM | POA: Diagnosis not present

## 2024-09-18 DIAGNOSIS — Z8249 Family history of ischemic heart disease and other diseases of the circulatory system: Secondary | ICD-10-CM | POA: Insufficient documentation

## 2024-09-18 DIAGNOSIS — Z87891 Personal history of nicotine dependence: Secondary | ICD-10-CM | POA: Diagnosis not present

## 2024-09-18 DIAGNOSIS — I061 Rheumatic aortic insufficiency: Secondary | ICD-10-CM

## 2024-09-18 DIAGNOSIS — I5020 Unspecified systolic (congestive) heart failure: Secondary | ICD-10-CM | POA: Insufficient documentation

## 2024-09-18 DIAGNOSIS — Z720 Tobacco use: Secondary | ICD-10-CM | POA: Diagnosis not present

## 2024-09-18 DIAGNOSIS — Z79899 Other long term (current) drug therapy: Secondary | ICD-10-CM | POA: Diagnosis not present

## 2024-09-18 DIAGNOSIS — I051 Rheumatic mitral insufficiency: Secondary | ICD-10-CM | POA: Diagnosis not present

## 2024-09-18 DIAGNOSIS — I493 Ventricular premature depolarization: Secondary | ICD-10-CM | POA: Insufficient documentation

## 2024-09-18 DIAGNOSIS — R9431 Abnormal electrocardiogram [ECG] [EKG]: Secondary | ICD-10-CM | POA: Diagnosis not present

## 2024-09-18 DIAGNOSIS — I5022 Chronic systolic (congestive) heart failure: Secondary | ICD-10-CM | POA: Diagnosis not present

## 2024-09-18 DIAGNOSIS — Z9889 Other specified postprocedural states: Secondary | ICD-10-CM | POA: Insufficient documentation

## 2024-09-18 DIAGNOSIS — I671 Cerebral aneurysm, nonruptured: Secondary | ICD-10-CM

## 2024-09-18 MED ORDER — LOSARTAN POTASSIUM 50 MG PO TABS: 50.0000 mg | ORAL_TABLET | Freq: Every day | ORAL | 3 refills | Status: AC

## 2024-09-18 MED ORDER — SPIRONOLACTONE 25 MG PO TABS
12.5000 mg | ORAL_TABLET | Freq: Every day | ORAL | 3 refills | Status: AC
Start: 2024-09-18 — End: 2024-12-17

## 2024-09-18 NOTE — Progress Notes (Signed)
 Patient presents for 3 week f/u in VAD Clinic today alone.  Pt ambulated into clinic today. Reports lightheadedness/dizziness with position changes. Experiencing intermittent chest tightness/aching. Chest pain resolves on it's own, or if she holds my arms up. States she experiences nausea every day with poor appetite. She is short of breath with minimal activity, and has to take it slow with frequent rest breaks. States she is increasingly fatigued.  She quit smoking 2.5 weeks ago. States she has not noticed improvement in her shortness of breath since quitting. Has had an increase intensity in her daily headaches/migraines since quitting. She has follow up with Dr Skeet in April 2026. Advised she call his office to schedule an appt with increased headaches (hx of aneurysm). She verbalized understanding.    Reports palpitations/heart racing all the time. Started Mexiletine 150 mg BID as prescribed last visit. EKG obtained today. Reviewed by Dr Cherrie.   Started Spironolactone 12.5 mg daily as prescribed last visit. BP mildly elevated today. Per Dr Bensimhon increase Losartan  to 50 mg daily. Updated prescription sent to pt's pharmacy. She verbalized understanding of medication change.    CPX completed 10/31/ 25. Results reviewed with patient by Dr Cherrie.   Dr Cherrie had long discussion with pt regarding advanced therapy options. Per her RHC and CPX results she does not qualify for VAD yet. Per Dr Cherrie will medically manage for now. Will need PFTs, CT scans, panorex, and consult with Dr Daniel if we pursue VAD in the future.   Will plan to see her back in heart failure clinic in 2 months with an echo per Dr Cherrie. Echo order placed today. Pt placed on re-call list to schedule for appt. Kamilah aware pt will need echo with her appt in 2 months.   Vital Signs:  HR: 99 w/ frequent PVCs BP: 132/80 (96) SPO2: 95%   Weight: 97.0 lb  Last weight: 100.4 lb Home weights: 96.0 - 97.0  lbs   Symptom YES NO DETAILS  Angina x  Activity: intermittent chest tightness/ache- resolves on own. Sometimes holding her arms up helps.   Claudication  x How Far:  Syncope  x When: lightheadness/dizziness with position changes  Stroke  x   Orthopnea x  How many pillows: 3   PND   How often:  CPAP   How many hours:  Pedal Edema  x   Abdominal Fullness x  Poor appetite. Weight down another 3 lbs today  Nausea / Vomit x  Nauseated all the time  Diaphoresis  x When:  Shortness of Breath x  Activity: with minimal activity; has not improved since quitting smoking.   Palpitations x  When: all the time; especially when laying flat  ICD shock     Bleeding S/S  x   Tea-colored Urine  x   Hospitalizations  x   Emergency Room  x   Other MD     Activity Able to complete ADLs, grocery shopping, and chores. Takes it slow. Has been increasingly fatigued.   Fluid   Diet Poor appetite. Weight down another 3 lbs today.     Patient Instructions:  Increase Losartan  to 50 mg daily. I sent a prescription for Losartan  50 mg tablets to your pharmacy Return to heart failure clinic in 2 months for follow up with Dr Bensimhon. We will complete an echo with this visit. You are on the re-call list as schedule in 2 months has not been published yet. If you have not received a call  from the heart failure clinic by the end of December/beginning of January please call [phone] to schedule an appointment.   Isaiah Knoll RN VAD Coordinator  Office: [phone]  24/7 Pager: [phone]

## 2024-09-18 NOTE — Patient Instructions (Addendum)
 Increase Losartan  to 50 mg daily. I sent a prescription for Losartan  50 mg tablets to your pharmacy Return to heart failure clinic in 2 months for follow up with Dr Bensimhon. We will complete an echo with this visit. You are on the re-call list as schedule in 2 months has not been published yet. If you have not received a call from the heart failure clinic by the end of December/beginning of January please call [phone] to schedule an appointment.

## 2024-09-19 NOTE — Progress Notes (Signed)
 ADVANCED HEART FAILURE CLINIC NOTE  Referring Physician: Lennie Raguel MATSU, DO  Primary Care: Lennie Raguel MATSU, DO Primary Cardiologist: Heart Failure: Ria Commander, DO  CC: HFrEF, Severe MR/AI  HPI: Allison Leonard is a 48 y.o. female with severe MR, AI and HFrEF, NICM, and tobacco abuse.   Pertinent Family & social hx:  - Dad - coronary disease, Aunt died form heart failure.  -  Smoking 5-6 cigarettes a day and marijuana daily. Lives with her Mom and 3 adult children. Middle child has down syndrome.   Cardiac History:  - TTE in 3/23 with LVEF 50-55% and mild AI/MR. Repeat echo in 2/24 with LVEF 50% - Established care with Dr. Barbaraann in 2023 due to tachycardia / SOB.  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  - TEE on 04/28/24 w/ EF 25%, mod-severe MR, mod MS, mod-sev AI.  - Referred to Dr. Kelvin Sennett for surgery and subsequently to [facility] for advanced therapies evaluation - did not make appt - [facility] 9/25: No CAD. RA 2 PA 30/8 (17) PCW 6 Fick 3.4/2.3 TD 4.0/2.8 - CPX 10/25: pVO2 16.6 (53%) Slope 29 pRER 1.19 OUES 1.2 (80%)   Interval hx:   She presents today  to continue to discuss possible advanced therapies (VAD). Recently underwent RHC and CPX - both of which did not meet cirteria for advanced therapies.Missed appointments for PFTs, CT scans and panorex.  Remains NYHA III dyspnea. No edema. Quit smoking 2 weeks ago. Having worsening HAs and following with Dr. Skeet in Neuro.   PHYSICAL EXAM: Vitals:   09/18/24 1406  BP: 132/80  Pulse: 99  SpO2: 95%    Wt Readings from Last 3 Encounters:  09/18/24 44 kg (97 lb)  08/21/24 45.5 kg (100 lb 6.4 oz)  08/05/24 47.4 kg (104 lb 9.6 oz)   General:  Thin. No resp difficulty HEENT: normal Neck: supple. no JVD.  Cor: Regular rate & rhythm. No rubs, gallops or murmurs. Lungs: clear but decresed Abdomen: soft, nontender, nondistended.Good bowel sounds. Extremities: no cyanosis,  clubbing, rash, edema Neuro: alert & orientedx3, cranial nerves grossly intact. moves all 4 extremities w/o difficulty. Affect pleasant    EKG:n/a  DATA REVIEW  ECG: 04/07/24: NSR w/ PVCs, LVH  As per my personal interpretation  ECHO: 04/28/24: LVEF 25%, RV mildly reduced. 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 Mod-severe AI.   CATH: 07/09/2024  RA:                  2 mmHg (mean) RV:                  28/2 mmHg PA:                  30/8 mmHg (17 mean) PCWP:            6 mmHg (mean)                                     Estimated Fick CO/CI   3.4 L/min, 2.3 L/min/m2 Thermodilution CO/CI   4 L/min, 2.8 L/min/m2                                              TPG  11  mmHg                                            PVR                 2.75-3.2 Wood Units  PAPi                11  Coronary angiography Complicated by severe radial spasm throughout the case.  Short LM with trifurcation into LAD, RM, Lcx. No obstructive CAD visualized. Lcx not well visualized due to radial artery spasm. Dominant RCA with no disease.    IMPRESSION: No coronary artery disease.  Normal to mildly reduced cardiac output / index.  Low filling pressures.  Mildly elevated PVR.   - CPX 10/25: pVO2 16.6 (53%) Slope 29 pRER 1.19 OUES 1.2 (80%)  ASSESSMENT & PLAN:  Heart Failure with reduced fraction - Etiology & History: Likely valvular; mod-severe MR/AI 2/2 rheumatic changes. Unable to obtain CMR due to ICA embolization. Cath - no coronary disease, mildly reduced CO/CI. Low filling pressures.  - TEE June 2025 Echo LVEF 25%  previously EF was ~ 55%. In February 2024.  - NYHA III-IIIB - symptoms seem out of proportion to objective fidningd - Volume status: ok  - GDMT:  -  RAASi: Increase losartan  25mg  -> 50mg  daily Beta-blocker: toprol  25mg  daily -  Hydralazine/Nitrates: N/A -  SGLT2i: jardiance  10mg   - MRA: spironolactone 12.5 daily - Continue digoxin 0.125 daily - ICD:  Evaluating for advanced therapies - Complex case with several issues The first issue is whether she would be a candidate/benefit from dual valve surgery in setting of severe LV dysfunction. Case previously discussed with Dr. Shyrl who felt that she will be very high risk for x2 valve repair/replacement in the setting of biventricular failure. Now considering VAD. RHC and CPX do not qualify. Will continue to follow closely. If EF improves with GDMT/PVC suppression may be able to reconsider valve surgery  - repeat echo at next visit - labs today  Mod-severe AI/MR  - as above - TEE on 04/28/24 - reviewed personally  Mild to moderate MS - mean gradient of  Right ICA Aneurysm - s/p embolization at [facility] in 11/2021  Tobacco use - Has quit x 2.5 weeks. Encouraged her to stay quit  Palpitations/PVCs/NSVT  - Zio 10/25: SR avg 102. 26 runs NSVT longest 11 beats. PVCs 5.8% - Continue mexilitene 150 bid for PVC suppression - No PVCs on ECG today  I spent a total of 44 minutes today: 1) reviewing the patient's medical records including previous charts, labs and recent notes from other providers; 2) examining the patient and counseling them on their medical issues/explaining the plan of care; 3) adjusting meds as needed and 4) ordering lab work or other needed tests.     Toribio Fuel, MD  9:32 PM

## 2024-09-21 ENCOUNTER — Other Ambulatory Visit (HOSPITAL_COMMUNITY): Payer: Self-pay | Admitting: Adult Health

## 2024-09-28 ENCOUNTER — Ambulatory Visit

## 2024-09-29 ENCOUNTER — Ambulatory Visit

## 2024-10-18 ENCOUNTER — Emergency Department (HOSPITAL_COMMUNITY)

## 2024-10-18 ENCOUNTER — Encounter (HOSPITAL_COMMUNITY): Payer: Self-pay | Admitting: *Deleted

## 2024-10-18 ENCOUNTER — Observation Stay (HOSPITAL_COMMUNITY)
Admission: EM | Admit: 2024-10-18 | Discharge: 2024-10-20 | Disposition: A | Attending: Emergency Medicine | Admitting: Emergency Medicine

## 2024-10-18 DIAGNOSIS — E43 Unspecified severe protein-calorie malnutrition: Secondary | ICD-10-CM | POA: Insufficient documentation

## 2024-10-18 DIAGNOSIS — R109 Unspecified abdominal pain: Secondary | ICD-10-CM | POA: Diagnosis not present

## 2024-10-18 DIAGNOSIS — R892 Abnormal level of other drugs, medicaments and biological substances in specimens from other organs, systems and tissues: Secondary | ICD-10-CM | POA: Diagnosis not present

## 2024-10-18 DIAGNOSIS — Z79899 Other long term (current) drug therapy: Secondary | ICD-10-CM | POA: Diagnosis not present

## 2024-10-18 DIAGNOSIS — E8729 Other acidosis: Secondary | ICD-10-CM | POA: Diagnosis present

## 2024-10-18 DIAGNOSIS — R112 Nausea with vomiting, unspecified: Secondary | ICD-10-CM | POA: Diagnosis present

## 2024-10-18 DIAGNOSIS — N3289 Other specified disorders of bladder: Secondary | ICD-10-CM | POA: Diagnosis not present

## 2024-10-18 DIAGNOSIS — I10 Essential (primary) hypertension: Secondary | ICD-10-CM | POA: Diagnosis not present

## 2024-10-18 DIAGNOSIS — Z7982 Long term (current) use of aspirin: Secondary | ICD-10-CM | POA: Diagnosis not present

## 2024-10-18 DIAGNOSIS — R1115 Cyclical vomiting syndrome unrelated to migraine: Principal | ICD-10-CM | POA: Insufficient documentation

## 2024-10-18 DIAGNOSIS — R002 Palpitations: Secondary | ICD-10-CM | POA: Diagnosis not present

## 2024-10-18 DIAGNOSIS — R1084 Generalized abdominal pain: Secondary | ICD-10-CM | POA: Diagnosis not present

## 2024-10-18 DIAGNOSIS — R7889 Finding of other specified substances, not normally found in blood: Secondary | ICD-10-CM

## 2024-10-18 DIAGNOSIS — R079 Chest pain, unspecified: Secondary | ICD-10-CM | POA: Diagnosis not present

## 2024-10-18 DIAGNOSIS — E872 Acidosis, unspecified: Secondary | ICD-10-CM | POA: Diagnosis not present

## 2024-10-18 DIAGNOSIS — I5032 Chronic diastolic (congestive) heart failure: Secondary | ICD-10-CM | POA: Insufficient documentation

## 2024-10-18 DIAGNOSIS — Z90710 Acquired absence of both cervix and uterus: Secondary | ICD-10-CM | POA: Diagnosis not present

## 2024-10-18 DIAGNOSIS — G43909 Migraine, unspecified, not intractable, without status migrainosus: Secondary | ICD-10-CM | POA: Diagnosis present

## 2024-10-18 DIAGNOSIS — R0602 Shortness of breath: Secondary | ICD-10-CM | POA: Diagnosis not present

## 2024-10-18 DIAGNOSIS — R110 Nausea: Secondary | ICD-10-CM | POA: Diagnosis not present

## 2024-10-18 LAB — CBC
HCT: 50.2 % — ABNORMAL HIGH (ref 36.0–46.0)
Hemoglobin: 15.8 g/dL — ABNORMAL HIGH (ref 12.0–15.0)
MCH: 26.5 pg (ref 26.0–34.0)
MCHC: 31.5 g/dL (ref 30.0–36.0)
MCV: 84.2 fL (ref 80.0–100.0)
Platelets: 231 K/uL (ref 150–400)
RBC: 5.96 MIL/uL — ABNORMAL HIGH (ref 3.87–5.11)
RDW: 14.2 % (ref 11.5–15.5)
WBC: 11.2 K/uL — ABNORMAL HIGH (ref 4.0–10.5)
nRBC: 0 % (ref 0.0–0.2)

## 2024-10-18 LAB — COMPREHENSIVE METABOLIC PANEL WITH GFR
ALT: 13 U/L (ref 0–44)
AST: 31 U/L (ref 15–41)
Albumin: 4.8 g/dL (ref 3.5–5.0)
Alkaline Phosphatase: 69 U/L (ref 38–126)
Anion gap: 20 — ABNORMAL HIGH (ref 5–15)
BUN: 20 mg/dL (ref 6–20)
CO2: 18 mmol/L — ABNORMAL LOW (ref 22–32)
Calcium: 9.7 mg/dL (ref 8.9–10.3)
Chloride: 96 mmol/L — ABNORMAL LOW (ref 98–111)
Creatinine, Ser: 0.86 mg/dL (ref 0.44–1.00)
GFR, Estimated: >60 mL/min (ref >60)
Glucose, Bld: 81 mg/dL (ref 70–99)
Potassium: 4.5 mmol/L (ref 3.5–5.1)
Sodium: 134 mmol/L — ABNORMAL LOW (ref 135–145)
Total Bilirubin: 1.3 mg/dL — ABNORMAL HIGH (ref 0.0–1.2)
Total Protein: 8.2 g/dL — ABNORMAL HIGH (ref 6.5–8.1)

## 2024-10-18 LAB — LIPASE, BLOOD: Lipase: 25 U/L (ref 11–51)

## 2024-10-18 MED ORDER — LACTATED RINGERS IV SOLN: INTRAVENOUS | Status: DC

## 2024-10-18 MED ORDER — METOCLOPRAMIDE HCL 5 MG/ML IJ SOLN
10.0000 mg | Freq: Once | INTRAMUSCULAR | Status: AC
  Administered 2024-10-18: 10 mg via INTRAVENOUS
  Filled 2024-10-18: qty 2

## 2024-10-18 MED ORDER — METOCLOPRAMIDE HCL 10 MG PO TABS: 10.0000 mg | ORAL_TABLET | Freq: Four times a day (QID) | ORAL | 0 refills | Status: DC

## 2024-10-18 MED ORDER — METOPROLOL TARTRATE 5 MG/5ML IV SOLN
5.0000 mg | Freq: Once | INTRAVENOUS | Status: AC
  Administered 2024-10-18: 5 mg via INTRAVENOUS
  Filled 2024-10-18: qty 5

## 2024-10-18 MED ORDER — DIPHENHYDRAMINE HCL 50 MG/ML IJ SOLN
12.5000 mg | Freq: Once | INTRAMUSCULAR | Status: AC
  Administered 2024-10-18: 12.5 mg via INTRAVENOUS
  Filled 2024-10-18: qty 1

## 2024-10-18 MED ORDER — DIGOXIN 125 MCG PO TABS
0.1250 mg | ORAL_TABLET | Freq: Once | ORAL | Status: AC
  Administered 2024-10-18: 0.125 mg via ORAL
  Filled 2024-10-18: qty 1

## 2024-10-18 MED ORDER — IOHEXOL 350 MG/ML SOLN
75.0000 mL | Freq: Once | INTRAVENOUS | Status: AC | PRN
  Administered 2024-10-18: 75 mL via INTRAVENOUS

## 2024-10-18 MED ORDER — METOPROLOL SUCCINATE ER 25 MG PO TB24: 25.0000 mg | ORAL_TABLET | Freq: Every day | ORAL | Status: DC

## 2024-10-18 NOTE — ED Triage Notes (Signed)
 Pt here via [facility] from home for nausea, vomiting sore throat x 2 days.  Denies fevers.  States has been unable to take heart failure meds since she became ill.  HR 120 Sats 100% RA Bp 160/90 Cbg 66 Rr 26

## 2024-10-18 NOTE — ED Notes (Signed)
 Mother Allison Leonard [phone] would like an update asap

## 2024-10-18 NOTE — ED Provider Notes (Signed)
 [facility] AT [facility] Provider Note   CSN: [id number] Arrival date & time: 10/18/24  2042     Patient presents with: Abdominal Pain   Allison Leonard is a 48 y.o. female.  {Add pertinent medical, surgical, social history, OB history to HPI:32947}  Abdominal Pain  Patient is a 48 year old female with past medical history significant for cyclic vomiting, reflux, migraines, cannabinoid hyperemesis, severe heart failure with EF of 25% thought to be due to valvular disease  Brought in by EMS from home for nausea vomiting sore throat fatigue.  She denies any fevers no chest pain or difficulty breathing.  She has not been able to take her blood pressure medications and heart failure medications since her vomiting started.       Prior to Admission medications  Medication Sig Start Date End Date Taking? Authorizing Provider  aspirin  325 MG tablet Take 325 mg by mouth daily.    [provider]  dicyclomine  (BENTYL ) 20 MG tablet TAKE 1 TABLET (20 MG TOTAL) BY MOUTH 3 (THREE) TIMES DAILY BEFORE MEALS. 11/29/23   Stacia Glendia BRAVO, MD  digoxin  (LANOXIN ) 0.125 MG tablet Take 1 tablet (0.125 mg total) by mouth daily. 08/05/24   Clegg, Amy D, NP  empagliflozin  (JARDIANCE ) 10 MG TABS tablet Take 1 tablet (10 mg total) by mouth daily before breakfast. 05/25/24   Monge, Damien BROCKS, NP  famotidine  (PEPCID ) 20 MG tablet TAKE 1 TABLET BY MOUTH TWICE A DAY 09/01/24   Pruett, Milda CROME, MD  Fremanezumab -vfrm (AJOVY ) 225 MG/1.5ML SOAJ Inject 225 mg into the skin every 28 (twenty-eight) days. 06/18/24   Skeet Juliene SAUNDERS, DO  losartan  (COZAAR ) 50 MG tablet Take 1 tablet (50 mg total) by mouth daily. 09/18/24   Bensimhon, Toribio SAUNDERS, MD  lubiprostone  (AMITIZA ) 24 MCG capsule Take 1 capsule (24 mcg total) by mouth 2 (two) times daily with a meal. 12/17/22   Zehr, Jessica D, PA-C  metoprolol  succinate (TOPROL  XL) 25 MG 24 hr tablet Take 1 tablet (25 mg total) by mouth  daily. 04/07/24   Daneen Damien BROCKS, NP  mexiletine (MEXITIL ) 150 MG capsule Take 1 capsule (150 mg total) by mouth 2 (two) times daily. 08/21/24   Bensimhon, Toribio SAUNDERS, MD  promethazine  (PHENERGAN ) 25 MG tablet TAKE 1 TABLET BY MOUTH EVERY 6 HOURS AS NEEDED FOR NAUSEA OR VOMITING. 03/31/24   Dameron, Marisa, DO  Rimegepant Sulfate (NURTEC) 75 MG TBDP DISSOLVE 1 TABLET BY MOUTH AT EARLIEST ONSET OF MIGRAINE AS NEEDED. MAX 1 TAB PER 24 HOURS 07/28/24   Jaffe, Adam R, DO  spironolactone  (ALDACTONE ) 25 MG tablet Take 0.5 tablets (12.5 mg total) by mouth daily. 09/18/24 12/17/24  Bensimhon, Toribio SAUNDERS, MD  tiZANidine  (ZANAFLEX ) 4 MG tablet Take 1 tablet (4 mg total) by mouth 3 (three) times daily as needed for muscle spasms. 03/08/24   Skeet Juliene SAUNDERS, DO    Allergies: Zofran  [ondansetron  hcl], Egg protein-containing drug products, and Morphine  and codeine    Review of Systems  Gastrointestinal:  Positive for abdominal pain.    Updated Vital Signs BP (!) 154/72   Pulse (!) 102   Temp 98.3 F (36.8 C) (Oral)   Resp (!) 23   Ht 5' 2 (1.575 m)   Wt 44 kg   SpO2 100%   BMI 17.74 kg/m   Physical Exam Vitals and nursing note reviewed.  Constitutional:      General: She is not in acute distress. HENT:  Head: Normocephalic and atraumatic.     Nose: Nose normal.     Mouth/Throat:     Mouth: Mucous membranes are dry.  Eyes:     General: No scleral icterus. Cardiovascular:     Rate and Rhythm: Normal rate and regular rhythm.     Pulses: Normal pulses.     Heart sounds: Normal heart sounds.  Pulmonary:     Effort: Pulmonary effort is normal. No respiratory distress.     Breath sounds: No wheezing.  Abdominal:     Palpations: Abdomen is soft.     Tenderness: There is abdominal tenderness. There is no guarding or rebound.     Comments: Generalized abdominal tenderness no guarding or rebound.  Musculoskeletal:     Cervical back: Normal range of motion.     Right lower leg: No edema.     Left  lower leg: No edema.  Skin:    General: Skin is warm and dry.     Capillary Refill: Capillary refill takes less than 2 seconds.  Neurological:     Mental Status: She is alert. Mental status is at baseline.  Psychiatric:        Mood and Affect: Mood normal.        Behavior: Behavior normal.     (all labs ordered are listed, but only abnormal results are displayed) Labs Reviewed  COMPREHENSIVE METABOLIC PANEL WITH GFR - Abnormal; Notable for the following components:      Result Value   Sodium 134 (*)    Chloride 96 (*)    CO2 18 (*)    Total Protein 8.2 (*)    Total Bilirubin 1.3 (*)    Anion gap 20 (*)    All other components within normal limits  CBC - Abnormal; Notable for the following components:   WBC 11.2 (*)    RBC 5.96 (*)    Hemoglobin 15.8 (*)    HCT 50.2 (*)    All other components within normal limits  LIPASE, BLOOD  URINALYSIS, ROUTINE W REFLEX MICROSCOPIC  MAGNESIUM     EKG: EKG Interpretation Date/Time:  Sunday October 18 2024 20:59:50 EST Ventricular Rate:  110 PR Interval:  186 QRS Duration:  86 QT Interval:  328 QTC Calculation: 443 R Axis:   97  Text Interpretation: Sinus tachycardia with occasional Premature ventricular complexes Biatrial enlargement Rightward axis Anterior infarct , age undetermined ST & T wave abnormality, consider inferolateral ischemia Abnormal ECG Baseline wander limiting interpretation Confirmed by Ellouise Fine ([id number]) on 10/18/2024 9:55:17 PM  Radiology: CT ABDOMEN PELVIS W CONTRAST Result Date: 10/18/2024 EXAM: CT ABDOMEN AND PELVIS WITH CONTRAST 10/18/2024 10:28:37 PM TECHNIQUE: CT of the abdomen and pelvis was performed with the administration of intravenous contrast. 75 mL of iohexol  (OMNIPAQUE ) 350 MG/ML injection was administered. Multiplanar reformatted images are provided for review. Automated exposure control, iterative reconstruction, and/or weight-based adjustment of the mA/kV was utilized to reduce the  radiation dose to as low as reasonably achievable. COMPARISON: 10/17/2019. CLINICAL HISTORY: Abdominal pain, acute, nonlocalized. FINDINGS: LOWER CHEST: No acute abnormality. LIVER: The liver is unremarkable. GALLBLADDER AND BILE DUCTS: Gallbladder is unremarkable. No biliary ductal dilatation. SPLEEN: No acute abnormality. PANCREAS: No acute abnormality. ADRENAL GLANDS: No acute abnormality. KIDNEYS, URETERS AND BLADDER: Subcentimeter left renal cyst, benign (Bosniak category I). Per consensus, no follow-up is needed for simple Bosniak type 1 and 2 renal cysts, unless the patient has a malignancy history or risk factors. No stones in the kidneys or ureters. No hydronephrosis.  No perinephric or periureteral stranding. Urinary bladder is unremarkable. GI AND BOWEL: Stomach demonstrates no acute abnormality. Prior appendectomy. There is no bowel obstruction. PERITONEUM AND RETROPERITONEUM: No ascites. No free air. VASCULATURE: Atherosclerotic calcifications of the abdominal aorta. LYMPH NODES: No lymphadenopathy. REPRODUCTIVE ORGANS: Status post hysterectomy. Two lower vaginal cysts measuring up to 2.5 cm (image 74), chronic. BONES AND SOFT TISSUES: No acute osseous abnormality. No focal soft tissue abnormality. IMPRESSION: 1. No acute findings in the abdomen or pelvis. 2. Postsurgical changes, as above. Electronically signed by: Pinkie Pebbles MD 10/18/2024 10:38 PM EST RP Workstation: [id number]   DG Chest Portable 1 View Result Date: 10/18/2024 EXAM: 1 VIEW(S) XRAY OF THE CHEST 10/18/2024 09:49:22 PM COMPARISON: 01/04/2022 CLINICAL HISTORY: SOB, NV FINDINGS: LUNGS AND PLEURA: No focal pulmonary opacity. No pleural effusion. No pneumothorax. HEART AND MEDIASTINUM: No acute abnormality of the cardiac and mediastinal silhouettes. BONES AND SOFT TISSUES: No acute osseous abnormality. IMPRESSION: 1. No acute cardiopulmonary process. Electronically signed by: Pinkie Pebbles MD 10/18/2024 09:53 PM EST RP  Workstation: [id number]    {Document cardiac monitor, telemetry assessment procedure when appropriate:32947} Procedures   Medications Ordered in the ED  lactated ringers  infusion (0 mLs Intravenous Stopped 10/18/24 2156)  metoCLOPramide  (REGLAN ) injection 10 mg (10 mg Intravenous Given 10/18/24 2148)  diphenhydrAMINE  (BENADRYL ) injection 12.5 mg (12.5 mg Intravenous Given 10/18/24 2148)  iohexol  (OMNIPAQUE ) 350 MG/ML injection 75 mL (75 mLs Intravenous Contrast Given 10/18/24 2229)      {Click here for ABCD2, HEART and other calculators REFRESH Note before signing:1}                              Medical Decision Making Amount and/or Complexity of Data Reviewed Labs: ordered. Radiology: ordered.  Risk Prescription drug management.   This patient presents to the ED for concern of abd pain, NV, this involves a number of treatment options, and is a complaint that carries with it a moderate risk of complications and morbidity. A differential diagnosis was considered for the patient's symptoms which is discussed below:   The emergent differential diagnosis for vomiting includes, but is not limited to ACS/MI, Boerhaave's, DKA, Intracranial Hemorrhage, Ischemic bowel, Meningitis, Sepsis, Acute gastric dilation, Acetaminophen  toxicity, Adrenal insufficiency, Appendicitis, Aspirin  toxicity, Bowel obstruction/ileus, Cholecystitis, CNS tumor. Electrolyte abnormalities, Elevated ICP, Gastric outlet obstruction, Hyperemesis gravidarum, Pancreatitis, Peritonitis, Ruptured viscus, Testicular torsion/ovarian torsion, Biliary colic, Cannabinoid hyperemesis syndrome, Disulfiram effect, ETOH, Gastritis, Gastroenteritis, Gastroparesis, Hepatitis, Ibuprofen, Labyrinthitis, Migraine, Motion sickness, Narcotic withdrawal, Thyroid , Pregnancy, Peptic ulcer disease, Renal colic, and UTI    Co morbidities: Discussed in HPI   Brief History:  Patient is a 48 year old female with past medical history  significant for cyclic vomiting, reflux, migraines, cannabinoid hyperemesis, severe heart failure with EF of 25% thought to be due to valvular disease  Brought in by EMS from home for nausea vomiting sore throat fatigue.  She denies any fevers no chest pain or difficulty breathing.  She has not been able to take her blood pressure medications and heart failure medications since her vomiting started.    EMR reviewed including pt PMHx, past surgical history and past visits to ER.   See HPI for more details   Lab Tests:   I ordered and independently interpreted labs. Labs notable for Leukocytosis/erythrocytosis c/w dehydration CMP w anion gap of 20 Mag ***  Imaging Studies:  NAD. I personally reviewed all imaging studies and no acute abnormality found. I agree with  radiology interpretation.    Cardiac Monitoring:  The patient was maintained on a cardiac monitor.  I personally viewed and interpreted the cardiac monitored which showed an underlying rhythm of: Sinus rhythm  EKG non-ischemic abn EKG   Medicines ordered:  I ordered medication including Benadryl , Reglan , digitate only use of LR, metoprolol , diltiazem  for dehydration Reevaluation of the patient after these medicines showed that the patient improved I have reviewed the patients home medicines and have made adjustments as needed   Critical Interventions:     Consults/Attending Physician      Reevaluation:  After the interventions noted above I re-evaluated patient and found that they have :improved   Social Determinants of Health:      Problem List / ED Course:  Patient is feeling much improved after Reglan  and Benadryl .  CT abdomen pelvis with contrast and portable chest x-ray without abnormal finding.  Will repeat BMP after fluids. Is tolerating PO currently. Suspect flare of cyclic vomiting. Recommend gentle hydration and reglan  at home for sx.   Dispostion:  After consideration of the  diagnostic results and the patients response to treatment, I feel that the patent would benefit from outpatient follow up.     Final diagnoses:  Abdominal pain, unspecified abdominal location    ED Discharge Orders     None

## 2024-10-19 ENCOUNTER — Observation Stay (HOSPITAL_COMMUNITY)

## 2024-10-19 ENCOUNTER — Other Ambulatory Visit: Payer: Self-pay

## 2024-10-19 ENCOUNTER — Encounter (HOSPITAL_COMMUNITY): Payer: Self-pay

## 2024-10-19 ENCOUNTER — Ambulatory Visit

## 2024-10-19 DIAGNOSIS — E43 Unspecified severe protein-calorie malnutrition: Secondary | ICD-10-CM | POA: Diagnosis not present

## 2024-10-19 DIAGNOSIS — Z79899 Other long term (current) drug therapy: Secondary | ICD-10-CM | POA: Diagnosis not present

## 2024-10-19 DIAGNOSIS — R7889 Finding of other specified substances, not normally found in blood: Secondary | ICD-10-CM | POA: Diagnosis not present

## 2024-10-19 DIAGNOSIS — Z789 Other specified health status: Secondary | ICD-10-CM

## 2024-10-19 DIAGNOSIS — Z7982 Long term (current) use of aspirin: Secondary | ICD-10-CM | POA: Diagnosis not present

## 2024-10-19 DIAGNOSIS — E872 Acidosis, unspecified: Secondary | ICD-10-CM

## 2024-10-19 DIAGNOSIS — F1290 Cannabis use, unspecified, uncomplicated: Secondary | ICD-10-CM | POA: Insufficient documentation

## 2024-10-19 DIAGNOSIS — I5032 Chronic diastolic (congestive) heart failure: Secondary | ICD-10-CM | POA: Diagnosis not present

## 2024-10-19 DIAGNOSIS — R112 Nausea with vomiting, unspecified: Secondary | ICD-10-CM | POA: Diagnosis not present

## 2024-10-19 DIAGNOSIS — R1115 Cyclical vomiting syndrome unrelated to migraine: Secondary | ICD-10-CM | POA: Diagnosis not present

## 2024-10-19 DIAGNOSIS — R9431 Abnormal electrocardiogram [ECG] [EKG]: Secondary | ICD-10-CM | POA: Insufficient documentation

## 2024-10-19 DIAGNOSIS — I0981 Rheumatic heart failure: Secondary | ICD-10-CM | POA: Insufficient documentation

## 2024-10-19 DIAGNOSIS — R109 Unspecified abdominal pain: Secondary | ICD-10-CM | POA: Diagnosis not present

## 2024-10-19 LAB — TROPONIN I (HIGH SENSITIVITY)
Troponin I (High Sensitivity): 15 ng/L (ref <18)
Troponin I (High Sensitivity): 12 ng/L (ref <18)

## 2024-10-19 LAB — URINALYSIS, MICROSCOPIC (REFLEX): Bacteria, UA: NONE SEEN

## 2024-10-19 LAB — CBC
HCT: 45.9 % (ref 36.0–46.0)
Hemoglobin: 14.3 g/dL (ref 12.0–15.0)
MCH: 26.6 pg (ref 26.0–34.0)
MCHC: 31.2 g/dL (ref 30.0–36.0)
MCV: 85.3 fL (ref 80.0–100.0)
Platelets: 220 K/uL (ref 150–400)
RBC: 5.38 MIL/uL — ABNORMAL HIGH (ref 3.87–5.11)
RDW: 14.4 % (ref 11.5–15.5)
WBC: 12.2 K/uL — ABNORMAL HIGH (ref 4.0–10.5)
nRBC: 0 % (ref 0.0–0.2)

## 2024-10-19 LAB — URINALYSIS, ROUTINE W REFLEX MICROSCOPIC
Glucose, UA: NEGATIVE mg/dL
Ketones, ur: >80 mg/dL — AB
Leukocytes,Ua: NEGATIVE
Nitrite: NEGATIVE
Protein, ur: NEGATIVE mg/dL
Specific Gravity, Urine: 1.015 (ref 1.005–1.030)
pH: 5.5 (ref 5.0–8.0)

## 2024-10-19 LAB — DIGOXIN LEVEL: Digoxin Level: 0.7 ng/mL — ABNORMAL LOW (ref 0.8–2.0)

## 2024-10-19 LAB — BASIC METABOLIC PANEL WITH GFR
Anion gap: 19 — ABNORMAL HIGH (ref 5–15)
BUN: 18 mg/dL (ref 6–20)
CO2: 17 mmol/L — ABNORMAL LOW (ref 22–32)
Calcium: 9.8 mg/dL (ref 8.9–10.3)
Chloride: 97 mmol/L — ABNORMAL LOW (ref 98–111)
Creatinine, Ser: 0.85 mg/dL (ref 0.44–1.00)
GFR, Estimated: >60 mL/min (ref >60)
Glucose, Bld: 94 mg/dL (ref 70–99)
Potassium: 4.7 mmol/L (ref 3.5–5.1)
Sodium: 134 mmol/L — ABNORMAL LOW (ref 135–145)

## 2024-10-19 LAB — HEMOGLOBIN A1C
Hgb A1c MFr Bld: 5.6 % (ref 4.8–5.6)
Mean Plasma Glucose: 114.02 mg/dL

## 2024-10-19 LAB — BRAIN NATRIURETIC PEPTIDE: B Natriuretic Peptide: 30.1 pg/mL (ref 0.0–100.0)

## 2024-10-19 LAB — TSH: TSH: 0.952 u[IU]/mL (ref 0.350–4.500)

## 2024-10-19 LAB — MAGNESIUM: Magnesium: 1.7 mg/dL (ref 1.7–2.4)

## 2024-10-19 MED ORDER — CAPSAICIN 0.075 % EX CREA
TOPICAL_CREAM | Freq: Two times a day (BID) | CUTANEOUS | Status: DC
  Filled 2024-10-19: qty 57

## 2024-10-19 MED ORDER — DIPHENHYDRAMINE HCL 50 MG/ML IJ SOLN
12.5000 mg | Freq: Four times a day (QID) | INTRAMUSCULAR | Status: AC
  Administered 2024-10-19 (×2): 12.5 mg via INTRAVENOUS
  Filled 2024-10-19 (×2): qty 1

## 2024-10-19 MED ORDER — ACETAMINOPHEN 500 MG PO TABS
1000.0000 mg | ORAL_TABLET | Freq: Four times a day (QID) | ORAL | Status: DC | PRN
  Administered 2024-10-19: 22:00:00 1000 mg via ORAL
  Filled 2024-10-19: qty 2

## 2024-10-19 MED ORDER — DICYCLOMINE HCL 20 MG PO TABS
20.0000 mg | ORAL_TABLET | Freq: Three times a day (TID) | ORAL | Status: DC
  Administered 2024-10-19 – 2024-10-20 (×4): 20 mg via ORAL
  Filled 2024-10-19 (×5): qty 1

## 2024-10-19 MED ORDER — SODIUM CHLORIDE 0.9 % IV SOLN
12.5000 mg | Freq: Once | INTRAVENOUS | Status: AC
  Administered 2024-10-19: 03:00:00 12.5 mg via INTRAVENOUS
  Filled 2024-10-19: qty 12.5

## 2024-10-19 MED ORDER — LACTATED RINGERS IV SOLN: INTRAVENOUS | Status: AC

## 2024-10-19 MED ORDER — METOCLOPRAMIDE HCL 5 MG/ML IJ SOLN
10.0000 mg | Freq: Four times a day (QID) | INTRAMUSCULAR | Status: DC
  Administered 2024-10-19: 04:00:00 10 mg via INTRAVENOUS
  Filled 2024-10-19: qty 2

## 2024-10-19 MED ORDER — MAGNESIUM SULFATE 2 GM/50ML IV SOLN
2.0000 g | Freq: Once | INTRAVENOUS | Status: AC
  Administered 2024-10-19: 03:00:00 2 g via INTRAVENOUS
  Filled 2024-10-19: qty 50

## 2024-10-19 MED ORDER — HALOPERIDOL LACTATE 5 MG/ML IJ SOLN
2.0000 mg | Freq: Once | INTRAMUSCULAR | Status: AC
  Administered 2024-10-19: 01:00:00 2 mg via INTRAVENOUS
  Filled 2024-10-19: qty 1

## 2024-10-19 MED ORDER — SPIRONOLACTONE 12.5 MG HALF TABLET
12.5000 mg | ORAL_TABLET | Freq: Every day | ORAL | Status: DC
  Administered 2024-10-19 – 2024-10-20 (×2): 12.5 mg via ORAL
  Filled 2024-10-19 (×2): qty 1

## 2024-10-19 MED ORDER — DIPHENHYDRAMINE HCL 25 MG PO CAPS
25.0000 mg | ORAL_CAPSULE | Freq: Four times a day (QID) | ORAL | Status: DC
  Administered 2024-10-19 – 2024-10-20 (×4): 25 mg via ORAL
  Filled 2024-10-19 (×4): qty 1

## 2024-10-19 MED ORDER — METOCLOPRAMIDE HCL 5 MG/ML IJ SOLN
10.0000 mg | Freq: Four times a day (QID) | INTRAMUSCULAR | Status: DC
  Administered 2024-10-19 – 2024-10-20 (×4): 10 mg via INTRAVENOUS
  Filled 2024-10-19 (×4): qty 2

## 2024-10-19 MED ORDER — ADULT MULTIVITAMIN W/MINERALS CH
1.0000 | ORAL_TABLET | Freq: Every day | ORAL | Status: DC
  Administered 2024-10-19 – 2024-10-20 (×2): 1 via ORAL
  Filled 2024-10-19 (×2): qty 1

## 2024-10-19 MED ORDER — BOOST / RESOURCE BREEZE PO LIQD CUSTOM
1.0000 | Freq: Three times a day (TID) | ORAL | Status: DC
  Administered 2024-10-19: 18:00:00 1 via ORAL
  Administered 2024-10-19: 12:00:00 237 mL via ORAL

## 2024-10-19 MED ORDER — LORAZEPAM 2 MG/ML IJ SOLN
0.5000 mg | Freq: Once | INTRAMUSCULAR | Status: AC
  Administered 2024-10-19: 01:00:00 0.5 mg via INTRAVENOUS
  Filled 2024-10-19: qty 1

## 2024-10-19 MED ORDER — ASPIRIN 325 MG PO TABS
325.0000 mg | ORAL_TABLET | Freq: Every day | ORAL | Status: DC
  Administered 2024-10-19 – 2024-10-20 (×2): 325 mg via ORAL
  Filled 2024-10-19 (×2): qty 1

## 2024-10-19 MED ORDER — MEXILETINE HCL 150 MG PO CAPS
150.0000 mg | ORAL_CAPSULE | Freq: Two times a day (BID) | ORAL | Status: DC
  Administered 2024-10-19 – 2024-10-20 (×3): 150 mg via ORAL
  Filled 2024-10-19 (×3): qty 1

## 2024-10-19 MED ORDER — FAMOTIDINE 20 MG PO TABS
20.0000 mg | ORAL_TABLET | Freq: Two times a day (BID) | ORAL | Status: DC
  Administered 2024-10-19 – 2024-10-20 (×3): 20 mg via ORAL
  Filled 2024-10-19 (×3): qty 1

## 2024-10-19 MED ORDER — SUMATRIPTAN SUCCINATE 6 MG/0.5ML ~~LOC~~ SOLN: 6.0000 mg | SUBCUTANEOUS | Status: DC | PRN

## 2024-10-19 MED ORDER — LOSARTAN POTASSIUM 50 MG PO TABS
50.0000 mg | ORAL_TABLET | Freq: Every day | ORAL | Status: DC
  Administered 2024-10-19 – 2024-10-20 (×2): 50 mg via ORAL
  Filled 2024-10-19 (×2): qty 1

## 2024-10-19 MED ORDER — PROCHLORPERAZINE EDISYLATE 10 MG/2ML IJ SOLN: 10.0000 mg | Freq: Four times a day (QID) | INTRAMUSCULAR | Status: DC

## 2024-10-19 MED ORDER — ENOXAPARIN SODIUM 30 MG/0.3ML IJ SOSY
30.0000 mg | PREFILLED_SYRINGE | Freq: Every day | INTRAMUSCULAR | Status: DC
  Administered 2024-10-19: 09:00:00 30 mg via SUBCUTANEOUS
  Filled 2024-10-19 (×2): qty 0.3

## 2024-10-19 MED ORDER — METOPROLOL SUCCINATE ER 25 MG PO TB24
25.0000 mg | ORAL_TABLET | Freq: Every day | ORAL | Status: DC
  Administered 2024-10-19 – 2024-10-20 (×2): 25 mg via ORAL
  Filled 2024-10-19 (×2): qty 1

## 2024-10-19 MED ORDER — LACTATED RINGERS IV SOLN: INTRAVENOUS | Status: DC

## 2024-10-19 MED ORDER — THIAMINE MONONITRATE 100 MG PO TABS
100.0000 mg | ORAL_TABLET | Freq: Every day | ORAL | Status: DC
  Administered 2024-10-19 – 2024-10-20 (×2): 100 mg via ORAL
  Filled 2024-10-19 (×2): qty 1

## 2024-10-19 MED ORDER — DIGOXIN 125 MCG PO TABS
0.1250 mg | ORAL_TABLET | Freq: Every day | ORAL | Status: DC
  Administered 2024-10-19 – 2024-10-20 (×2): 0.125 mg via ORAL
  Filled 2024-10-19 (×2): qty 1

## 2024-10-19 MED ORDER — LORAZEPAM 2 MG/ML IJ SOLN
1.0000 mg | Freq: Four times a day (QID) | INTRAMUSCULAR | Status: AC
  Administered 2024-10-19 (×2): 1 mg via INTRAVENOUS
  Filled 2024-10-19 (×2): qty 1

## 2024-10-19 MED ORDER — TIZANIDINE HCL 4 MG PO TABS: 4.0000 mg | ORAL_TABLET | Freq: Three times a day (TID) | ORAL | Status: DC | PRN

## 2024-10-19 MED ORDER — FAMOTIDINE IN NACL 20-0.9 MG/50ML-% IV SOLN
20.0000 mg | Freq: Once | INTRAVENOUS | Status: AC
  Administered 2024-10-19: 02:00:00 20 mg via INTRAVENOUS
  Filled 2024-10-19: qty 50

## 2024-10-19 NOTE — Assessment & Plan Note (Addendum)
 Most likely due to cyclic vomiting syndrome; her history of migraines makes this diagnosis more likely. Vitals and exam improved from presentation with medications and fluids. Patient is most likely sleepy on exam given she is s/p haldol , ativan , and benadryl  administration in the ED. Admit to family medicine, Dr. Delores attending, med/tele, vitals per floor Will obtain RUQ US  given abdominal pain to r/o hepatic/biliary pathology, CTAP reassuring Repeat EKG later this morning to monitor Qtc and ST-T abnormalities (below) Magnesium  1.7, was repleted with 2g IV mg sulftate Antiemetics - consider redosing if effective: Restart home Bentyl  and pepcid  once able to PO Topical capsaicin  twice daily Benadryl  12.5 mg IV every 6 hours for 2 doses Lorazepam  1 mg IV every 6 hours for 2 doses Reglan  10 mg IV every 6 hours for 2 doses Sumatriptan  6 mg subcu every hour as needed max 2 doses AM labs: AM CBC, CMP, A1c, TSH, BNP now TOC consult for help with cessation of cannabis use

## 2024-10-19 NOTE — Assessment & Plan Note (Addendum)
 Closely followed by outpatient cardiology, last EF 25% on TEE 04/2024.  No signs of volume overload, but we will be very cautious with maintenance fluids. ST abnormalities on EKG could be due to metabolic acidosis and baseline poor cardiac function given initial troponin reassuring and overall improvement on repeat EKG. Restart home digoxin  0.125 mg daily and mexiletine 150 mg twice daily for rate control Restart spironolactone  (could help with fluid status in setting of IVF), metoprolol  succinate, losartan  for GDMT Hold Jardiance  10 mg given volume depletion Monitor fluid status closely, can consider chasing fluids with Lasix if appears to be retaining fluid. Consider repeating echocardiogram this admission

## 2024-10-19 NOTE — Progress Notes (Signed)
 Initial Nutrition Assessment  DOCUMENTATION CODES:  Severe malnutrition in context of social or environmental circumstances, Underweight  INTERVENTION:  100 mg thiamine  PO once daily for 7 days. Continue Multivitamin PO once daily. Continue Boost Breeze TID while on clear liquid diet. Each supplement provides 250 Kcals and 9 grams of protein. Advance diet as tolerated to low-sodium diet. Ensure Plus High Protein PO TID when diet advances. Each supplement provides 350 Kcals and 20 grams of protein.  NUTRITION DIAGNOSIS:  Severe Malnutrition related to social / environmental circumstances as evidenced by severe fat depletion, severe muscle depletion, energy intake < or equal to 50% for > or equal to 1 month, percent weight loss   GOAL:  Patient will meet greater than or equal to 90% of their needs, Weight gain   MONITOR:  PO intake, Supplement acceptance, Diet advancement, Labs, Weight trends  REASON FOR ASSESSMENT:  Consult Assessment of nutrition requirement/status  ASSESSMENT:  Patient presented with N/V and was found to have exacerbation of cyclic vomiting syndrome. 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  Visited the patient who states that she has been severely depressed since her sister died in 2024-09-15. She has not been eating well because of that and often just lies in bed all day. Her UBW is 115 lbs which she last weighed around 2024/08/16 before his sister's death. She tells me she has not been getting therapy for her depression. She has been tolerating some water  and is currently attempting to drink a [employer]. Her last emesis was last night. Encouraged the patient to drink Boost Breeze TID while on CLD. She follows a low-sodium diet at home and confirmed that she missed  her LVAD eval appointment. She is amenable to Ensure Plus High Protein when diet advances. Marijuana cessation counseling has been ordered  by provider due to suspicion that it might be causing cyclic vomiting.  Typical day's intake: Breakfast - skips Lunch - cereal or oatmeal around noon Dinner - hamburger with rice/gravy, corn, roll at around 8-9 pm ONS - none  Scheduled Meds:  aspirin   325 mg Oral Daily   capsicum   Topical BID   dicyclomine   20 mg Oral TID AC   digoxin   0.125 mg Oral Daily   diphenhydrAMINE   25 mg Oral Q6H   enoxaparin  (LOVENOX ) injection  30 mg Subcutaneous Daily   famotidine   20 mg Oral BID   feeding supplement  1 Container Oral TID BM   losartan   50 mg Oral Daily   metoCLOPramide  (REGLAN ) injection  10 mg Intravenous Q6H   metoprolol  succinate  25 mg Oral Daily   mexiletine  150 mg Oral BID   multivitamin with minerals  1 tablet Oral Daily   spironolactone   12.5 mg Oral Daily   Continuous Infusions:  lactated ringers  40 mL/hr at 10/19/24 1239   PRN Meds:.SUMAtriptan , tiZANidine   Diet Order             Diet clear liquid Room service appropriate? Yes; Fluid consistency: Thin  Diet effective now                  Meal Intake: Minimal per patient  Labs:     Latest Ref Rng & Units 10/19/2024    1:15 AM 10/18/2024    8:52 PM 08/27/2024   10:45 AM  CMP  Glucose  70 - 99 mg/dL 94  81  97   BUN 6 - 20 mg/dL 18  20  14    Creatinine 0.44 - 1.00 mg/dL 9.14  9.13  9.39   Sodium 135 - 145 mmol/L 134  134  136   Potassium 3.5 - 5.1 mmol/L 4.7  4.5  4.0   Chloride 98 - 111 mmol/L 97  96  97   CO2 22 - 32 mmol/L 17  18  25    Calcium 8.9 - 10.3 mg/dL 9.8  9.7  9.8   Total Protein 6.5 - 8.1 g/dL  8.2    Total Bilirubin 0.0 - 1.2 mg/dL  1.3    Alkaline Phos 38 - 126 U/L  69    AST 15 - 41 U/L  31    ALT 0 - 44 U/L  13     I/O: No data charted  NUTRITION - FOCUSED PHYSICAL EXAM: Flowsheet Row Most Recent Value  Orbital Region Severe depletion   Upper Arm Region Moderate depletion  Thoracic and Lumbar Region Severe depletion  Buccal Region Severe depletion  Temple Region Moderate depletion  Clavicle Bone Region Severe depletion  Clavicle and Acromion Bone Region Severe depletion  Scapular Bone Region Severe depletion  Dorsal Hand Moderate depletion  Patellar Region Severe depletion  Anterior Thigh Region Unable to assess  [patient with tight pants on]  Posterior Calf Region Severe depletion  Edema (RD Assessment) None  Hair Unable to assess  [hair cover]  Eyes Reviewed  Mouth Reviewed  Skin Reviewed  Nails Reviewed    EDUCATION NEEDS:  Education needs have been addressed  Skin:  Skin Assessment: Reviewed RN Assessment  Last BM:  no data charted  Height:  Ht Readings from Last 1 Encounters:  10/18/24 5' 2 (1.575 m)   Weight:  Wt Readings from Last 10 Encounters:  10/18/24 44 kg  09/18/24 44 kg  08/21/24 45.5 kg  08/05/24 47.4 kg  07/17/24 46.3 kg  07/01/24 47.6 kg  06/30/24 46.8 kg  06/24/24 46.3 kg  06/18/24 45.8 kg  05/25/24 49.9 kg   Weight Change: Per records: 2.3 Kg (5%) loss in 1 month, 5 Kg (10%) loss in 6 months. Per patient, 8 Kg (16%) loss in 3 months from UBW  Usual Body Weight: 115 lbs (52.3 Kg) in 08/24/24 before her sister died  Edema: none  Ideal Body Weight:  50 kg   BMI:  Body mass index is 17.74 kg/m.  Estimated Nutritional Needs:  Kcal:  1500-1700 Protein:  75-100 Fluid:  >/=1500    Leverne Ruth, MS, RDN, LDN [place]. [facility] See AMION for contact information Secure chat preferred

## 2024-10-19 NOTE — ED Provider Notes (Signed)
 12:30 AM Prior nursing, patient has had another episode of emesis.  Have ordered Ativan  and Haldol  for management. Previously given Reglan  with temporary improvement. Hx of anaphylaxis to Zofran .  Droperidol  held given cardiac hx, though QTc today appears OK.  Added on digoxin  level to exclude digoxin  toxicity as cause of recurrent emesis.  Patient does report some chest pain, but states this can happen when vomiting occurs. She has no SOB. Have reviewed the patient's EKG; notable for appears to be new T wave inversions in V5 and V6 compared to 1 month ago. Troponin added. Will also repeat EKG.  12:39 AM UA with ketonuria, no proteinuria.  Likely reflective of dehydration.  Unfortunately, patient is unable to be aggressively hydrated given her history of CHF. LVEF 25%.  12:45 AM T waves in V6 improved, still with some persistent inversions in V5. Now also with some inversions present in lead III. Changes are nonspecific. Troponin pending.   EKG Interpretation Date/Time:  Monday October 19 2024 00:36:43 EST Ventricular Rate:  97 PR Interval:  192 QRS Duration:  85 QT Interval:  345 QTC Calculation: 439 R Axis:   105  Text Interpretation: Sinus rhythm Biatrial enlargement Nonspecific T abnormalities, inferior and lateral leads , new since prior visits Confirmed by Haze Lonni PARAS [phone]) on 10/19/2024 12:46:08 AM       1:58 AM Metabolic acidosis persists. Likely GI losses/starvation ketosis/dehydration. Family practice consulted for admission.   Keith Sor, PA-C 10/19/24 0159    Haze Lonni PARAS, MD 10/19/24 [phone]

## 2024-10-19 NOTE — H&P (Addendum)
 Hospital Admission History and Physical Service Pager: [phone]  Patient name: Allison Leonard Medical record number: [id number] Date of Birth: 04/26/1976 Age: 48 y.o. Gender: female  Primary Care Provider: Raguel Lee, DO Consultants: None Code Status: FULL Preferred Emergency Contact:  Contact Information     Name Relation Home Work Mobile   [place],Parthenia Mother [phone]  [phone]      Other Contacts   None on File    Chief Complaint: intractable vomiting  Differential and Medical Decision Making:  Allison Leonard is a 48 y.o. female presenting with intractable vomiting.  Differential for this patient's presentation of this includes cyclic vomiting syndrome (history of cyclical vomiting and cannabis use, ongoing for 2 days, intractable to antiemetics), migraines (history of migraines on CGRP, could not gather more pertinent history given sleepiness on exam), and SBO or other intraabdominal pathology (less likely given NBNB emesis, CTAP reassuring). Assessment & Plan Cyclic vomiting syndrome Migraines Cannabis use disorder Most likely due to cyclic vomiting syndrome; her history of migraines makes this diagnosis more likely. Vitals and exam improved from presentation with medications and fluids. Patient is most likely sleepy on exam given she is s/p haldol , ativan , and benadryl  administration in the ED. Admit to family medicine, Dr. Delores attending, med/tele, vitals per floor Will obtain RUQ US  given abdominal pain to r/o hepatic/biliary pathology, CTAP reassuring Repeat EKG later this morning to monitor Qtc and ST-T abnormalities (below) Magnesium  1.7, was repleted with 2g IV mg sulftate Antiemetics - consider redosing if effective: Restart home Bentyl  and pepcid  once able to PO Topical capsaicin  twice daily Benadryl  12.5 mg IV every 6 hours for 2 doses Lorazepam  1 mg IV every 6 hours for 2 doses Reglan  10 mg IV every 6 hours for 2  doses Sumatriptan  6 mg subcu every hour as needed max 2 doses AM labs: AM CBC, CMP, A1c, TSH, BNP now TOC consult for help with cessation of cannabis use Metabolic acidosis, increased anion gap Suspected to be 2/2 emesis versus poor p.o. intake and dehydration. Continue LR maintenance fluids 75 mL/h for 12 hours, caution with CHF as below Labs as above Rheumatic heart failure (HCC) Closely followed by outpatient cardiology, last EF 25% on TEE 04/2024.  No signs of volume overload, but we will be very cautious with maintenance fluids. ST abnormalities on EKG could be due to metabolic acidosis and baseline poor cardiac function given initial troponin reassuring and overall improvement on repeat EKG. Restart home digoxin  0.125 mg daily and mexiletine 150 mg twice daily for rate control Restart spironolactone  (could help with fluid status in setting of IVF), metoprolol  succinate, losartan  for GDMT Hold Jardiance  10 mg given volume depletion Monitor fluid status closely, can consider chasing fluids with Lasix if appears to be retaining fluid. Consider repeating echocardiogram this admission Chronic health problem ICA aneurysm s/p stenting: Continue aspirin  325 mg  FEN/GI: NPO, advance as tolerated to clear liquids VTE Prophylaxis: Lovenox   Disposition: Med/tele  History of Present Illness:  Allison Leonard is a 48 y.o. female presenting with intractable vomiting for 2 days. She has not coughed up any blood. States she has not been able to take meds for the last 2 days.  Patient very tired on exam; remainder of history obtained through chart review for patient comfort.  In the ED, was tachycardic in tachypneic, had multiple episodes of emesis resistant to Reglan , Benadryl , Haldol , and Ativan . Put on IV MF.  Was found to have metabolic acidosis with hemoconcentrated  CBC, and UA with hematuria.  CTAP reassuring found to have non-specific inferiolateral ST changes on ECG, troponin was  15.  Review Of Systems: Per HPI.  Pertinent Past Medical History: Cyclic vomiting syndrome Migraines Brain aneurysm s/p embolization/stent Rheumatic heart disease HFrEF (EF 25% on TEE 04/2024) Marijuana use Celiac disease GAD Remainder reviewed in history tab.   Pertinent Past Surgical History: Appendectomy 1999 Abdominal hysterectomy 2006 Right/left heart cath 07/2024 Carotid stenting 2023 Remainder reviewed in history tab.   Pertinent Social History: Tobacco use: Per chart review, 0.1 pack years, quit 2024 Alcohol use: Prior Other Substance use: Marijuana use  Pertinent Family History: Father-heart disease, HTN, HLD Sister-colon cancer Maternal aunt-stomach cancer, cervical cancer Paternal uncle-throat cancer  Important Outpatient Medications: Aspirin  325 mg daily Dicyclomine  20 mg 3 times daily  Digoxin  0.125 mg daily Empagliflozin  10 mg daily Losartan  50 mg daily Metoprolol  succinate 25 mg daily Spironolactone  12.5 mg daily Pepcid  20 mg twice daily Ajovy  225 mg subcu monthly for migraine ppx Rimegepant 75 mg ODT daily as needed Tizanidine  4 mg 3 times daily as needed  Objective: BP (!) 114/48   Pulse 94   Temp 98 F (36.7 C) (Oral)   Resp 16   Ht 5' 2 (1.575 m)   Wt 44 kg   SpO2 99%   BMI 17.74 kg/m  Exam: General: Sleeping comfortably in bed but arousable, no acute distress Cardiovascular: Regular rate and rhythm, prominent S1 and S2, no murmurs rubs or gallops on my exam Respiratory: Clear to auscultation bilaterally, no increased work of breathing Gastrointestinal: Hypoactive bowel sounds, mild generalized abdominal tenderness, no rebound Derm: Warm, dry, cap refill 2-3 seconds Neurologic: Arousable though very sleepy, answering questions, moving all extremities grossly equally Extremities: Nonedematous, nontender bilateral lower extremities  Labs:  CBC BMET  Recent Labs  Lab 10/19/24 0329  WBC 12.2*  HGB 14.3  HCT 45.9  PLT 220    Recent Labs  Lab 10/19/24 0115  NA 134*  K 4.7  CL 97*  CO2 17*  BUN 18  CREATININE 0.85  GLUCOSE 94  CALCIUM 9.8    Pertinent additional labs UA with moderate hemoglobin, greater than 80 protein, 6-10 RBCs, 6-10 squamous epithelial. Digoxin  level 0.7 Troponin 15 > pending Magnesium  1.7 anion gap 20 > 19  EKG: Initial EKG 2100 sinus tachycardia, ST-T depressions inferolateral leads right axis deviation, biatrial enlargement  Repeat EKG 0036 sinus rhythm borderline tachycardia nonspecific T wave abnormalities of inferior lateral leads  Imaging Studies Performed:  CXR Impression from Radiologist: IMPRESSION: 1. No acute cardiopulmonary process.  My Interpretation: Agree with radiology interpretation, bone spur of third rib  CTAP with contrast IMPRESSION: 1. No acute findings in the abdomen or pelvis. 2. Postsurgical changes, as above.  Lorrane Pac, MD 10/19/2024, 4:14 AM PGY-1, [facility] Family [facility]  FPTS Intern pager: [phone], text pages welcome Secure chat group [facility] Teaching Service   I agree with the assessment and plan as documented above.  Stuart Redo, MD PGY-3, [facility] Medicine

## 2024-10-19 NOTE — Assessment & Plan Note (Deleted)
 Pain complicated by cyclical vomiting, initial EKG evaluation finding ST-T depressions on inferior lateral leads, repeat found improved ST segment with T wave inversions of same leads.  Some concern for early developing ischemic infarct. Initial troponin 15, no repeat was drawn, pending repeat

## 2024-10-19 NOTE — Hospital Course (Signed)
 Allison Leonard is a 48 y.o.female with a history of cyclic vomiting syndrome and rheumatic heart disease with HFrEF who was admitted to the [facility] medicine Teaching Service at [facility] for intractable nausea and vomiting . Her hospital course is detailed below:   N/V  Heart Failure   Other chronic conditions were medically managed with home medications and formulary alternatives as necessary  PCP Follow-up Recommendations:

## 2024-10-19 NOTE — Assessment & Plan Note (Addendum)
 Suspected to be 2/2 emesis versus poor p.o. intake and dehydration. Continue LR maintenance fluids 75 mL/h for 12 hours, caution with CHF as below Labs as above

## 2024-10-19 NOTE — Assessment & Plan Note (Addendum)
 ICA aneurysm s/p stenting: Continue aspirin  325 mg

## 2024-10-19 NOTE — ED Notes (Signed)
 CN called floor who agreed that pt could be transported to floor.

## 2024-10-19 NOTE — ED Notes (Signed)
 Lgt green top recollected and sent to lab

## 2024-10-19 NOTE — Plan of Care (Signed)
 FMTS Interim Progress Note  S:Pt is groggy but carrying out conversation well. She reports no further n/v since admission. Endorses some drowsiness but in no acute discomfort. She endorses interest in eating and drinking today.   O: BP 134/68   Pulse (!) 107   Temp 98 F (36.7 C) (Oral)   Resp 15   Ht 5' 2 (1.575 m)   Wt 44 kg   SpO2 97%   BMI 17.74 kg/m   General: Awake, somnolement, NAD. Communicates clearly. Cardio: RRR. Loud S1, S2  w/ transmitted vibration through the chest. Prominent murmur on auscultation. 2+ radial and dorsalis pedis pulses b/l w/ good capillary refill. No LE edema appreciated.  Resp: CTA bilaterally. No wheezes, rales, or rhonchi. Normal work of breathing on room air Abdomen: soft, non distended. TTP over epigastric region, otherwise non tender w/o guarding, rigidity, or rebound.  Extremities: Warm extremities that appear cachectic  Neuro: Aox3. No focal deficits.   A/P: Cyclic vomiting syndrome Migraines Cannabis use disorder Borderline tachycardic otherwise stable vitals Most likely due to cyclic vomiting syndrome; her history of migraines makes this diagnosis more likely. Patient continues to be drowsy but was arousable and conversant this AM. No further n/v since admission. Tsh, A1c, Trop all WNL at admission.   -RUQ US  w/ no acute process -Repeat EKG today -Magnesium  1.7, was repleted with 2g IV mg sulftate -Antiemetics: Continue home Bentyl  and pepcid  once able to PO Topical capsaicin  twice daily Continue Benadryl  25 mg PO every 6 hours D/c Lorazepam   Continue Reglan  10 mg IV every 6 hours Sumatriptan  6 mg subcu every hour as needed max 2 doses -AM labs: AM BMP and Mg -TOC consult for help with cessation of cannabis use  Metabolic acidosis, increased anion gap Suspected to be 2/2 emesis versus poor p.o. intake and dehydration. -Decreased LR maintenance fluids to 1mL/h, caution with CHF as below -Labs as above  Rheumatic heart failure  (HCC) Closely followed by outpatient cardiology, last EF 25% on TEE 04/2024.  No signs of volume overload, but we will be very cautious with maintenance fluids.  -Continue home digoxin  0.125 mg daily and mexiletine 150 mg twice daily for rate control -Continue spironolactone , metoprolol  succinate, losartan  for GDMT -Hold Jardiance  10 mg given volume depletion -Monitor fluid status closely, can consider chasing fluids with Lasix if appears to be retaining fluid. -Consider repeating echocardiogram this admission  Chronic health problem ICA aneurysm s/p stenting: Continue aspirin  325 mg   FEN/GI: Clear liquids VTE Prophylaxis: Lovenox    Disposition: Pending clinical improvement  Manon Jester, DO 10/19/2024, 7:43 AM PGY-1, [facility] Family Medicine Service pager [phone]

## 2024-10-20 ENCOUNTER — Other Ambulatory Visit (HOSPITAL_COMMUNITY): Payer: Self-pay

## 2024-10-20 DIAGNOSIS — E43 Unspecified severe protein-calorie malnutrition: Secondary | ICD-10-CM | POA: Diagnosis not present

## 2024-10-20 DIAGNOSIS — R1115 Cyclical vomiting syndrome unrelated to migraine: Secondary | ICD-10-CM | POA: Diagnosis not present

## 2024-10-20 DIAGNOSIS — Z79899 Other long term (current) drug therapy: Secondary | ICD-10-CM | POA: Diagnosis not present

## 2024-10-20 DIAGNOSIS — I5032 Chronic diastolic (congestive) heart failure: Secondary | ICD-10-CM | POA: Diagnosis not present

## 2024-10-20 DIAGNOSIS — R7889 Finding of other specified substances, not normally found in blood: Secondary | ICD-10-CM | POA: Diagnosis not present

## 2024-10-20 DIAGNOSIS — Z7982 Long term (current) use of aspirin: Secondary | ICD-10-CM | POA: Diagnosis not present

## 2024-10-20 DIAGNOSIS — E872 Acidosis, unspecified: Secondary | ICD-10-CM | POA: Diagnosis not present

## 2024-10-20 LAB — BASIC METABOLIC PANEL WITH GFR
Anion gap: 12 (ref 5–15)
BUN: 9 mg/dL (ref 6–20)
CO2: 24 mmol/L (ref 22–32)
Calcium: 9.1 mg/dL (ref 8.9–10.3)
Chloride: 101 mmol/L (ref 98–111)
Creatinine, Ser: 0.74 mg/dL (ref 0.44–1.00)
GFR, Estimated: >60 mL/min (ref >60)
Glucose, Bld: 77 mg/dL (ref 70–99)
Potassium: 3.7 mmol/L (ref 3.5–5.1)
Sodium: 137 mmol/L (ref 135–145)

## 2024-10-20 LAB — MAGNESIUM: Magnesium: 1.7 mg/dL (ref 1.7–2.4)

## 2024-10-20 MED ORDER — ADULT MULTIVITAMIN W/MINERALS CH
1.0000 | ORAL_TABLET | Freq: Every day | ORAL | 0 refills | Status: AC
  Filled 2024-10-20: qty 100, 100d supply, fill #0

## 2024-10-20 MED ORDER — VITAMIN B-1 100 MG PO TABS: 100.0000 mg | ORAL_TABLET | Freq: Every day | ORAL | Status: DC

## 2024-10-20 MED ORDER — METOCLOPRAMIDE HCL 10 MG PO TABS
10.0000 mg | ORAL_TABLET | Freq: Four times a day (QID) | ORAL | 0 refills | Status: AC | PRN
  Filled 2024-10-20: qty 30, 8d supply, fill #0

## 2024-10-20 MED ORDER — PROMETHAZINE HCL 12.5 MG PO TABS
12.5000 mg | ORAL_TABLET | Freq: Four times a day (QID) | ORAL | 0 refills | Status: DC | PRN
  Filled 2024-10-20: qty 30, 8d supply, fill #0

## 2024-10-20 MED ORDER — CAPSAICIN 0.075 % EX CREA
TOPICAL_CREAM | Freq: Two times a day (BID) | CUTANEOUS | 0 refills | Status: DC
  Filled 2024-10-20: qty 28.3, fill #0

## 2024-10-20 MED ORDER — THIAMINE HCL 100 MG PO TABS
100.0000 mg | ORAL_TABLET | Freq: Every day | ORAL | 0 refills | Status: AC
  Filled 2024-10-20: qty 30, 30d supply, fill #0

## 2024-10-20 MED ORDER — ADULT MULTIVITAMIN W/MINERALS CH: 1.0000 | ORAL_TABLET | Freq: Every day | ORAL | Status: DC

## 2024-10-20 NOTE — Progress Notes (Shared)
 Daily Progress Note Intern Pager: [phone]  Patient name: Allison Leonard Medical record number: [id number] Date of birth: 1976-08-25 Age: 48 y.o. Gender: female  Primary Care Provider: Lennie Raguel MATSU, DO Consultants: *** Code Status: ***  Pt Overview and Major Events to Date:  ***  Medical Decision Making:  ***. Pertinent PMH/PSH includes ***.  Assessment & Plan Cyclical vomiting syndrome Migraines Cannabis use disorder Tachycardic*** Labs improved, AG closed Replete Mag Fluids?***   Most likely due to cyclic vomiting syndrome; her history of migraines makes this diagnosis more likely. Vitals and exam improved from presentation with medications and fluids. Patient is most likely sleepy on exam given she is s/p haldol , ativan , and benadryl  administration in the ED. Admit to family medicine, Dr. Delores attending, med/tele, vitals per floor Will obtain RUQ US  given abdominal pain to r/o hepatic/biliary pathology, CTAP reassuring Repeat EKG later this morning to monitor Qtc and ST-T abnormalities (below) Magnesium  1.7, was repleted with 2g IV mg sulftate Antiemetics - consider redosing if effective: Restart home Bentyl  and pepcid  once able to PO Topical capsaicin  twice daily Benadryl  12.5 mg IV every 6 hours for 2 doses Reglan  10 mg IV every 6 hours for 2 doses Sumatriptan  6 mg subcu every hour as needed max 2 doses AM labs: AM CBC, CMP, A1c, TSH, BNP now TOC consult for help with cessation of cannabis use Metabolic acidosis, increased anion gap    Suspected to be 2/2 emesis versus poor p.o. intake and dehydration. Continue LR maintenance fluids 75 mL/h for 12 hours, caution with CHF as below Labs as above Rheumatic heart failure (HCC)     Closely followed by outpatient cardiology, last EF 25% on TEE 04/2024.  No signs of volume overload, but we will be very cautious with maintenance fluids. ST abnormalities on EKG could be due to metabolic acidosis and  baseline poor cardiac function given initial troponin reassuring and overall improvement on repeat EKG. Restart home digoxin  0.125 mg daily and mexiletine 150 mg twice daily for rate control Restart spironolactone  (could help with fluid status in setting of IVF), metoprolol  succinate, losartan  for GDMT Hold Jardiance  10 mg given volume depletion Monitor fluid status closely, can consider chasing fluids with Lasix if appears to be retaining fluid. Consider repeating echocardiogram this admission Chronic health problem ICA aneurysm s/p stenting: Continue aspirin  325 mg Intractable nausea and vomiting  Metabolic acidosis  Protein-calorie malnutrition, severe    *** bullet points preferred for A/P, please avoid repeating one liner/medical decision making, delete statement to sign ***    Chronic and Stable Issues: ***  FEN/GI: *** PPx: *** Dispo:{FPTSDISOLIST:27587} {FPTSDISOTIME:27588}. Barriers include ***.   Subjective:  ***  Objective: Temp:  [97.7 F (36.5 C)-98.6 F (37 C)] 98 F (36.7 C) (12/16 0454) Pulse Rate:  [96-111] 96 (12/16 0454) Resp:  [16-20] 20 (12/15 2014) BP: (123-138)/(68-78) 125/75 (12/16 0454) SpO2:  [98 %-100 %] 100 % (12/16 0454) Physical Exam: General: *** Cardiovascular: *** Respiratory: *** Abdomen: *** Extremities: ***  Laboratory: Most recent CBC Lab Results  Component Value Date   WBC 12.2 (H) 10/19/2024   HGB 14.3 10/19/2024   HCT 45.9 10/19/2024   MCV 85.3 10/19/2024   PLT 220 10/19/2024   Most recent BMP    Latest Ref Rng & Units 10/20/2024    4:20 AM  BMP  Glucose 70 - 99 mg/dL 77   BUN 6 - 20 mg/dL 9   Creatinine 9.55 - 8.99 mg/dL 9.25  Sodium 135 - 145 mmol/L 137   Potassium 3.5 - 5.1 mmol/L 3.7   Chloride 98 - 111 mmol/L 101   CO2 22 - 32 mmol/L 24   Calcium 8.9 - 10.3 mg/dL 9.1     MG 1.7  Imaging/Diagnostic Tests:  RUQ US   No sonographic etiology for abdominal pain identified.   Manon Jester,  DO 10/20/2024, 7:20 AM  PGY-1, [facility] FPTS Intern pager: [phone], text pages welcome Secure chat group [facility] Teaching Service

## 2024-10-20 NOTE — Assessment & Plan Note (Signed)
° ° °  Suspected to be 2/2 emesis versus poor p.o. intake and dehydration. Continue LR maintenance fluids 75 mL/h for 12 hours, caution with CHF as below Labs as above

## 2024-10-20 NOTE — Discharge Instructions (Addendum)
 Dear Allison Leonard,  Thank you for letting us  participate in your care. You were hospitalized for nausea and vomiting and diagnosed with an acute exacerbation of your Cyclical vomiting syndrome. You were treated with medications to help your nausea and vomiting and IV fluids to help with your dehydration.  Please continue to work on marijuana cessation as this will help with your nausea and vomiting.  POST-HOSPITAL & CARE INSTRUCTIONS Go to your follow up appointments (listed below)   DOCTOR'S APPOINTMENT   Future Appointments  Date Time Provider Department Center  10/26/2024 11:00 AM Lennie Raguel MATSU, DO [facility]  02/16/2025 10:50 AM Skeet Juliene SAUNDERS, DO [facility] None     Take care and be well!  Family Medicine Teaching Service Inpatient Team [place]  [facility]  [address] [place], [place] [address] [phone]

## 2024-10-20 NOTE — Assessment & Plan Note (Signed)
 Tachycardic*** Labs improved, AG closed Replete Mag Fluids?***   Most likely due to cyclic vomiting syndrome; her history of migraines makes this diagnosis more likely. Vitals and exam improved from presentation with medications and fluids. Patient is most likely sleepy on exam given she is s/p haldol , ativan , and benadryl  administration in the ED. Admit to family medicine, Dr. Delores attending, med/tele, vitals per floor Will obtain RUQ US  given abdominal pain to r/o hepatic/biliary pathology, CTAP reassuring Repeat EKG later this morning to monitor Qtc and ST-T abnormalities (below) Magnesium  1.7, was repleted with 2g IV mg sulftate Antiemetics - consider redosing if effective: Restart home Bentyl  and pepcid  once able to PO Topical capsaicin  twice daily Benadryl  12.5 mg IV every 6 hours for 2 doses Reglan  10 mg IV every 6 hours for 2 doses Sumatriptan  6 mg subcu every hour as needed max 2 doses AM labs: AM CBC, CMP, A1c, TSH, BNP now [facility] consult for help with cessation of cannabis use

## 2024-10-20 NOTE — Assessment & Plan Note (Signed)
 ICA aneurysm s/p stenting: Continue aspirin  325 mg

## 2024-10-20 NOTE — TOC Transition Note (Signed)
 Transition of Care [facility]) - Discharge Note   Patient Details  Name: Allison Leonard MRN: [id number] Date of Birth: 11-03-1976  Transition of Care [facility]) CM/SW Contact:  Marval Gell, RN Phone Number: 10/20/2024, 10:51 AM   Clinical Narrative:     Beatris w patient at bedside, she has accepted printed resources for substance abuse.  She denies barriers to care in the community.  She lives w family, who provides transportation and states they will provide ride home as well.  No other ICM needs identified.   Final next level of care: Home/Self Care Barriers to Discharge: No Barriers Identified   Patient Goals and CMS Choice Patient states their goals for this hospitalization and ongoing recovery are:: to go home CMS Medicare.gov Compare Post Acute Care list provided to:: Patient Choice offered to / list presented to : Patient      Discharge Placement                       Discharge Plan and Services Additional resources added to the After Visit Summary for                  DME Arranged: N/A           HH Agency: NA        Social Drivers of Health (SDOH) Interventions SDOH Screenings   Food Insecurity: Food Insecurity Present (10/19/2024)  Housing: Low Risk (10/19/2024)  Transportation Needs: No Transportation Needs (10/19/2024)  Utilities: Not At Risk (10/19/2024)  Depression (PHQ2-9): Low Risk (01/04/2022)  Financial Resource Strain: Medium Risk (09/28/2024)  Physical Activity: Insufficiently Active (09/28/2024)  Social Connections: Socially Isolated (09/28/2024)  Stress: Stress Concern Present (09/28/2024)  Tobacco Use: Medium Risk (10/19/2024)     Readmission Risk Interventions     No data to display

## 2024-10-20 NOTE — Assessment & Plan Note (Signed)
° ° ° °  Closely followed by outpatient cardiology, last EF 25% on TEE 04/2024.  No signs of volume overload, but we will be very cautious with maintenance fluids. ST abnormalities on EKG could be due to metabolic acidosis and baseline poor cardiac function given initial troponin reassuring and overall improvement on repeat EKG. Restart home digoxin  0.125 mg daily and mexiletine 150 mg twice daily for rate control Restart spironolactone  (could help with fluid status in setting of IVF), metoprolol  succinate, losartan  for GDMT Hold Jardiance  10 mg given volume depletion Monitor fluid status closely, can consider chasing fluids with Lasix if appears to be retaining fluid. Consider repeating echocardiogram this admission

## 2024-10-20 NOTE — Discharge Summary (Addendum)
 Family Medicine Teaching [facility] Discharge Summary  Patient name: Allison Leonard Medical record number: [id number] Date of birth: 05/11/1976 Age: 48 y.o. Gender: female Date of Admission: 10/18/2024  Date of Discharge: 10/20/2024  Admitting Physician: Fairy Amy, MD  Primary Care Provider: Lennie Raguel MATSU, DO Consultants: None  Indication for Hospitalization: Vomiting  Discharge Diagnoses/Problem List:  Principal Problem for Admission: Acute exacerbation of cyclical vomiting syndrome Other Problems addressed during stay:  Principal Problem:   Cyclical vomiting syndrome Active Problems:  Cyclic vomiting syndrome  Protein-calorie malnutrition, severe Heart failure with history of rheumatic heart disease  Brief Hospital Course:  Allison Leonard is a 48 y.o.female with a history of cyclic vomiting syndrome and rheumatic heart disease with HFrEF who was admitted to the [facility] medicine Teaching Service at [facility] for intractable nausea and vomiting . Her hospital course is detailed below:   Cyclical vomiting syndrome Patient presented with nausea and vomiting consistent with an exacerbation of her cyclic vomiting syndrome.  Was treated with Haldol , Ativan , Benadryl  in the ED.  Obtain right upper quadrant ultrasound given abdominal pain which was reassuring.  Patient was treated with antiemetics including home Bentyl  and Pepcid , topical capsaicin  twice daily, Benadryl , lorazepam , Reglan , sumatriptan .  Given patient's poor p.o. and dehydration patient was given LR maintenance fluids with caution due to underlying heart disease.  By the time of discharge, patient was feeling well and off IV fluids.  Patient was not requiring any as needed medications for nausea or vomiting.  RD was consulted due to apparent weight loss, who recommended multivitamin, boost supplementation.  Heart Failure Patient with history of rheumatic heart disease, last EF on TEE 04/2524%.  No  signs of volume overload during admission.  EKG obtained showed ST abnormalities consistent with poor cardiac function, initial troponins were reassuring.  Repeat EKG did not show any acute changes.  Patient was continued on her home regimen.  Other chronic conditions were medically managed with home medications and formulary alternatives as necessary  PCP Follow-up Recommendations: Please continue to educate patient on effects of marijuana on cyclical vomiting syndrome Consider optimization of cyclical vomiting syndrome regimen including capsaicin  and Reglan  as needed. Consider outpatient RD evaluation for weight loss and poor p.o. Repeat CBC and BMP as outpatient     Results/Tests Pending at Time of Discharge:  Unresulted Labs (From admission, onward)    None        Disposition: Home  Discharge Condition: Stable  Discharge Exam:  Vitals:   10/20/24 0454 10/20/24 0809  BP: 125/75 127/62  Pulse: 96 94  Resp:  19  Temp: 98 F (36.7 C) 98.8 F (37.1 C)  SpO2: 100% 99%   General: Awake, alert, NAD. Communicates clearly. Cardio: RRR. Prominent heart sounds w/ biphasic murmur. 2+ radial and dorsalis pedis pulses b/l w/ good capillary refill. No LE edema.  Resp: CTA bilaterally. No wheezes, rales, or rhonchi. Normal work of breathing on room air Abdomen: soft, non-tender, non-distended, no epigastric pain. Normoactive BS auscultated. No guarding, rigidity, or rebound.  Neuro: Aox3, no focal deficits.   Significant Procedures: None  Significant Labs and Imaging:  Recent Labs  Lab 10/18/24 2052 10/19/24 0329  WBC 11.2* 12.2*  HGB 15.8* 14.3  HCT 50.2* 45.9  PLT 231 220   Recent Labs  Lab 10/18/24 2052 10/19/24 0115 10/20/24 0420  NA 134* 134* 137  K 4.5 4.7 3.7  CL 96* 97* 101  CO2 18* 17* 24  GLUCOSE 81 94 77  BUN 20 18 9   CREATININE 0.86 0.85 0.74  CALCIUM 9.7 9.8 9.1  MG  --  1.7 1.7  ALKPHOS 69  --   --   AST 31  --   --   ALT 13  --   --   ALBUMIN  4.8  --   --      Pertinent Imaging   Chest x-ray 12/14: No acute cardiopulmonary process CTAP 12/14: . No acute findings in the abdomen or pelvis. 2. Postsurgical changes, as above. US  RUQ 12/15: negative    Discharge Medications:  Allergies as of 10/20/2024       Reactions   Zofran  [ondansetron  Hcl] Anaphylaxis   Egg Protein-containing Drug Products Other (See Comments)   Severe stomach cramps   Morphine  And Codeine Itching        Medication List     TAKE these medications    Ajovy  225 MG/1.5ML Soaj Generic drug: Fremanezumab -vfrm Inject 225 mg into the skin every 28 (twenty-eight) days.   aspirin  325 MG tablet Take 325 mg by mouth daily.   capsicum 0.075 % topical cream Commonly known as: ZOSTRIX Apply topically 2 (two) times daily.   CertaVite/Antioxidants Tabs Take 1 tablet by mouth daily.   dicyclomine  20 MG tablet Commonly known as: BENTYL  TAKE 1 TABLET (20 MG TOTAL) BY MOUTH 3 (THREE) TIMES DAILY BEFORE MEALS. What changed:  when to take this reasons to take this   digoxin  0.125 MG tablet Commonly known as: LANOXIN  Take 1 tablet (0.125 mg total) by mouth daily.   empagliflozin  10 MG Tabs tablet Commonly known as: JARDIANCE  Take 1 tablet (10 mg total) by mouth daily before breakfast.   famotidine  20 MG tablet Commonly known as: PEPCID  TAKE 1 TABLET BY MOUTH TWICE A DAY   losartan  50 MG tablet Commonly known as: COZAAR  Take 1 tablet (50 mg total) by mouth daily.   metoCLOPramide  10 MG tablet Commonly known as: REGLAN  Take 1 tablet (10 mg total) by mouth every 6 (six) hours as needed for nausea.   metoprolol  succinate 25 MG 24 hr tablet Commonly known as: Toprol  XL Take 1 tablet (25 mg total) by mouth daily.   mexiletine 150 MG capsule Commonly known as: MEXITIL  Take 1 capsule (150 mg total) by mouth 2 (two) times daily.   Nurtec 75 MG Tbdp Generic drug: Rimegepant Sulfate DISSOLVE 1 TABLET BY MOUTH AT EARLIEST ONSET OF MIGRAINE AS  NEEDED. MAX 1 TAB PER 24 HOURS   promethazine  12.5 MG tablet Commonly known as: PHENERGAN  Take 1 tablet (12.5 mg total) by mouth every 6 (six) hours as needed for nausea or vomiting.   spironolactone  25 MG tablet Commonly known as: ALDACTONE  Take 0.5 tablets (12.5 mg total) by mouth daily.   thiamine  100 MG tablet Commonly known as: VITAMIN B1 Take 1 tablet (100 mg total) by mouth daily.   tiZANidine  4 MG tablet Commonly known as: Zanaflex  Take 1 tablet (4 mg total) by mouth 3 (three) times daily as needed for muscle spasms.        Discharge Instructions: Please refer to Patient Instructions section of EMR for full details.  Patient was counseled important signs and symptoms that should prompt return to medical care, changes in medications, dietary instructions, activity restrictions, and follow up appointments.   Follow-Up Appointments:  Future Appointments  Date Time Provider Department Center  10/26/2024 11:00 AM Lennie Raguel KANDICE ROSALEA [facility]  02/16/2025 10:50 AM Skeet Juliene SAUNDERS, DO [facility] None  Lonnie Mahnoor, MD 10/20/2024, 2:08 PM PGY-2, [facility]

## 2024-10-20 NOTE — Plan of Care (Signed)
°  Problem: Education: Goal: Knowledge of General Education information will improve Description: Including pain rating scale, medication(s)/side effects and non-pharmacologic comfort measures 10/20/2024 1045 by Virgene Stamps, RN Outcome: Adequate for Discharge 10/20/2024 1045 by Virgene Stamps, RN Outcome: Progressing   Problem: Health Behavior/Discharge Planning: Goal: Ability to manage health-related needs will improve Outcome: Adequate for Discharge   Problem: Clinical Measurements: Goal: Ability to maintain clinical measurements within normal limits will improve Outcome: Adequate for Discharge Goal: Will remain free from infection Outcome: Adequate for Discharge Goal: Diagnostic test results will improve Outcome: Adequate for Discharge Goal: Respiratory complications will improve Outcome: Adequate for Discharge Goal: Cardiovascular complication will be avoided Outcome: Adequate for Discharge   Problem: Activity: Goal: Risk for activity intolerance will decrease 10/20/2024 1045 by Virgene Stamps, RN Outcome: Adequate for Discharge 10/20/2024 1045 by Virgene Stamps, RN Outcome: Progressing   Problem: Nutrition: Goal: Adequate nutrition will be maintained 10/20/2024 1045 by Virgene Stamps, RN Outcome: Adequate for Discharge 10/20/2024 1045 by Virgene Stamps, RN Outcome: Progressing   Problem: Coping: Goal: Level of anxiety will decrease Outcome: Adequate for Discharge   Problem: Elimination: Goal: Will not experience complications related to bowel motility Outcome: Adequate for Discharge Goal: Will not experience complications related to urinary retention Outcome: Adequate for Discharge   Problem: Pain Managment: Goal: General experience of comfort will improve and/or be controlled Outcome: Adequate for Discharge   Problem: Safety: Goal: Ability to remain free from injury will improve Outcome: Adequate for Discharge   Problem: Skin Integrity: Goal: Risk for  impaired skin integrity will decrease Outcome: Adequate for Discharge

## 2024-10-24 ENCOUNTER — Other Ambulatory Visit: Payer: Self-pay | Admitting: Gastroenterology

## 2024-10-24 ENCOUNTER — Encounter: Payer: Self-pay | Admitting: Cardiovascular Disease

## 2024-10-24 DIAGNOSIS — R1013 Epigastric pain: Secondary | ICD-10-CM

## 2024-10-26 ENCOUNTER — Ambulatory Visit

## 2024-10-26 VITALS — BP 121/71 | HR 102 | Ht 67.0 in | Wt 94.0 lb

## 2024-10-26 DIAGNOSIS — I10 Essential (primary) hypertension: Secondary | ICD-10-CM | POA: Diagnosis not present

## 2024-10-26 DIAGNOSIS — I061 Rheumatic aortic insufficiency: Secondary | ICD-10-CM

## 2024-10-26 DIAGNOSIS — I051 Rheumatic mitral insufficiency: Secondary | ICD-10-CM

## 2024-10-26 DIAGNOSIS — I671 Cerebral aneurysm, nonruptured: Secondary | ICD-10-CM

## 2024-10-26 DIAGNOSIS — I5022 Chronic systolic (congestive) heart failure: Secondary | ICD-10-CM | POA: Diagnosis not present

## 2024-10-26 DIAGNOSIS — R079 Chest pain, unspecified: Secondary | ICD-10-CM

## 2024-10-26 DIAGNOSIS — R1115 Cyclical vomiting syndrome unrelated to migraine: Secondary | ICD-10-CM

## 2024-10-26 DIAGNOSIS — F321 Major depressive disorder, single episode, moderate: Secondary | ICD-10-CM

## 2024-10-26 DIAGNOSIS — I0981 Rheumatic heart failure: Secondary | ICD-10-CM

## 2024-10-26 MED ORDER — PROMETHAZINE HCL 12.5 MG PO TABS: 12.5000 mg | ORAL_TABLET | Freq: Four times a day (QID) | ORAL | 0 refills | Status: AC | PRN

## 2024-10-26 MED ORDER — MIRTAZAPINE 7.5 MG PO TABS: 7.5000 mg | ORAL_TABLET | Freq: Every day | ORAL | 0 refills | Status: DC

## 2024-10-26 MED ORDER — EMPAGLIFLOZIN 10 MG PO TABS: 10.0000 mg | ORAL_TABLET | Freq: Every day | ORAL | 3 refills | Status: AC

## 2024-10-26 NOTE — Telephone Encounter (Signed)
 Called pt in regards to chest pain.  Reports has been going on for a while.  Fells like swallowed something big and it's stuck in throat.  Pt reports has an OV with PCP today at 11 am.   Pt had Sep 18, 2024 OV with [facility].  Also had recent ED visit on 10/18/24.  Not currently having pain.  Reports pain occurs with and without activity.  Reports at times wakes up out of sleep.  Denies arm pain reports chronic nausea takes medication for this.  Reports has a hx of GERD takes medication for this; doesn't think GERD is cause of pain. Doesn't check BP at home.  Advised pt to monitor BP.  Advised pt if symptoms ED visit is needed.  Pt expresses understanding.  Will send to primary cardiologist to see if pt needs f/u with Gen Cardiology or [facility].

## 2024-10-26 NOTE — Assessment & Plan Note (Addendum)
 Hospital f/u for cyclical vomiting syndrome. Improved from day of admission. However, seems to be persistent at her baseline. Currently on Reglan  and phenergan . Prescribed capsicum at discharge, but has yet to pick it up.  - Refilled phenergan   - Labs ordered: CBC w/diff, BMP w/GFR

## 2024-10-26 NOTE — Patient Instructions (Addendum)
 We did an EKG in clinic today due to your chest tightness. It looked good. I would call the cardiologist office to confirm an appointment with them. I will send them a message on my end and see if they will see you sooner rather than later.   Today we are going to check some labs since you have gotten out of the hospital. I will let you know those results when they come back.  I have refilled the Jardiance  and phenergan .   The injector for your migraines will need to come from your neurologist. You can call their office or your pharmacy to request a refill.   I am so sorry to hear about the loss of your sister. I have sent in a medicine called mirtazapine . This is a depression medicine that can also help with sleep. Take this medication at bedtime. We start this medication at a low dose to see how it works for you.   Please come back in 1 month so we can check and see how that medication is working for you.   Tips to Help You Sleep Better   Having trouble getting good sleep is a common problem. Many people find they can't fall asleep easily, or they are able to fall asleep but wake up often during the night. Some people just don't feel rested, even after sleeping through the night.   There are many causes for sleep problems including health conditions like allergies, depression, and pain. Side effects from certain medicines, and major life changes such as the loss of family members and close friends or moving out of your home can impact sleep as well.    Sleep problems can also happen because of:   Poor sleep habits, such as not having a regular bedtime schedule  Stress at work, school, or in your relationships  Anxiety and worry     Sleep patterns change naturally as we age. Generally older adults:   Take longer to fall asleep  Have a harder time staying asleep and wake more often during the night  Sleep fewer hours   Sleep less deeply   Spend less time in the most restful stages of  sleep    What about sleeping pills?   It's best to try to solve the underlying cause of sleep problems without relying on medicine. Although sleeping pills might seem to help at first, they aren't a good long-term solution.  Here are some reasons why:  They don't treat the underlying causes of sleep problems.  They become less effective when used for longer periods of time.   They have too many side effects that can cause serious danger to your health and far outweigh any benefits you might get from using them.  They can be habit forming and you might become dependent on them.  When you stop taking them, sleep problems can return.      Develop good sleep habits   Good sleep habits are key to treating sleep problems. Here are some tips that many people have found helpful:    Go to bed at the same time every night and get up at the same time every day, even on weekends.  Get regular exercise, but don't do intense exercises within 3 to 4 hours before going to bed.   Get plenty of sunlight outdoors.  Avoid caffeine , including chocolate, for at least 4 hours before bedtime.  Don't drink alcohol or eat heavy meals close to bedtime  Stop using  your smartphone, computer, or tablet within an hour or two before bedtime.  Do something relaxing before bedtime such as deep breathing, meditation, or taking a warm bath.  Keep your bedroom cool, quiet, and dark when sleeping.  If you can't sleep, get up and do a relaxing activity until you feel sleepy.  Avoid napping, which tends to break up sleep when you go to bed.  If you use tobacco, quit. Nicotine is a stimulant and can interfere with your sleep.      Can therapy help?   Some people find that changing sleeping habits isn't enough to help them get a good night's sleep. If you've tried the tips listed above and they don't work, there are a few types of therapy that can help. These include:   Stimulus control  Stimulus control helps you develop  a positive and clear association between bed and sleeping. It focuses on creating a regular and consistent sleep/wake schedule.   Sleep restriction  The purpose of this therapy is to slowly increase your need for sleep. For a short period of time, you will go to bed a little later each night but always wake up at the same time every day. Once you establish a good sleep pattern, you will gradually increase the amount of sleep you get each night.     Relaxation training  Relaxation training includes techniques such as progressive muscle relaxation, guided imagery, and abdominal breathing. The purpose is to lower physical and mental stimulation that can interfere with sleep.  Cognitive behavioral therapy (CBT)  The goal of this therapy is to break the cycle of negative thoughts and feelings about sleep that can lead to poor sleeping habits.    Your doctor can give you information about these therapies and help you decide if one is right for you.      More resources:   Self-care apps:   Calm uses relaxation skills such as meditation and mindfulness to improve sleep, decrease stress, and reduce anxiety. It includes guided meditations, calming sounds, sleep stories, and music tracks for focus, relaxation, and sleep.    Headspace offers hundreds of self-guided resources along with one-on-one coaching. Includes sleepcasts and focus playlists, meditation and mindfulness exercises, and guided programs for improving sleep.    Recommended reading:   Overcoming Insomnia: A Cognitive-Behavioral Therapy Workbook, by [employer] Carney  No More [employer] book and workbook, by [employer]

## 2024-10-26 NOTE — Progress Notes (Signed)
" ° ° ° °  SUBJECTIVE:   CHIEF COMPLAINT / HPI: hospital f/u, plus other concerns listed below   Allison Leonard Ellen presents today for hospital follow up.   Hospitalized at [facility] from 10/18/24 to 10/20/24, for acute exacerbation of cyclical vomiting syndrome.  Since discharge, patient reports continued symptoms. She states it has only improved some. Every morning she wakes up feeling nauseous. She states she throws up about 4-5 times weekly. Still using marijuana: once weekly, if that.  She has other concerns today as seen below.   Chest pressure Patient complains of someone sitting on her chest.  She states this comes and goes.  Sometimes burping helps, but not always.  She states it happened all weekend.  Usually however, she feels this every other day roughly.  She sometimes experiences palpitations during these episodes, and when that happens she does feel short of breath.  She states she is going to see the cardiologist sometime in January, however, there is no appointment that I can see scheduled.  Troponin result from hospital  Patient is asking about her troponin from the hospitalization that was found to be 12.   Difficulty sleeping.  Patient states she has difficulty falling and staying asleep.  She states that her sister recently passed, and she is not taking care of her sisters twins.  She states one of the twins is currently pregnant.  She does endorse feeling depressed since the passing of her sister.  Needs refills.  Requests refills of her Jardiance , Phenergan , and Ajovy . Ajovy  is prescribed by her neurologist.   PERTINENT  PMH / PSH: Rheumatic heart failure, cyclic vomiting syndrome   OBJECTIVE:   BP 121/71   Pulse (!) 102   Ht 5' 7 (1.702 m)   Wt 94 lb (42.6 kg)   SpO2 97%   BMI 14.72 kg/m   General: very thin, NAD Cardiovascular: RRR, no M/R/G Respiratory: CTAB, normal work of breathing on room air  Abdomen: soft, non-distended, non-tender  to palpation MSK: no tenderness to palpation of chest bilaterally  ASSESSMENT/PLAN:   Assessment & Plan Chest pain, unspecified type Rheumatic heart failure (HCC) Non-reproducible on palpation. Regular rate and rhythm on exam today. History of rheumatic heart failure.  - EKG in clinic today, no ST elevation, possible left atrial enlargement  - Advised patient to call and ensure appointment with cardiology - Will get in touch with her Cardiologist about follow-up due to chest tightness Depression, major, single episode, moderate (HCC) PHQ-9 score of 12. Negative question number 9. Patient greiving the loss of her sister. Endorses feeling depressed. Would like to try medication.  - Prescribed Mirtazapine  7.5 mg today  - Follow-up in 1 month  Cyclical vomiting syndrome Hospital f/u for cyclical vomiting syndrome. Improved from day of admission. However, seems to be persistent at her baseline. Currently on Reglan  and phenergan . Prescribed capsicum at discharge, but has yet to pick it up.  - Refilled phenergan   - Labs ordered: CBC w/diff, BMP w/GFR     Raguel KANDICE Lee, DO  [facility]  "

## 2024-10-26 NOTE — Assessment & Plan Note (Addendum)
 Non-reproducible on palpation. Regular rate and rhythm on exam today. History of rheumatic heart failure.  - EKG in clinic today, no ST elevation, possible left atrial enlargement  - Advised patient to call and ensure appointment with cardiology - Will get in touch with her Cardiologist about follow-up due to chest tightness

## 2024-10-27 ENCOUNTER — Ambulatory Visit: Payer: Self-pay

## 2024-10-27 LAB — BASIC METABOLIC PANEL WITH GFR
BUN/Creatinine Ratio: 17 (ref 9–23)
BUN: 11 mg/dL (ref 6–24)
CO2: 28 mmol/L (ref 20–29)
Calcium: 9.7 mg/dL (ref 8.7–10.2)
Chloride: 99 mmol/L (ref 96–106)
Creatinine, Ser: 0.64 mg/dL (ref 0.57–1.00)
Glucose: 77 mg/dL (ref 70–99)
Potassium: 5.3 mmol/L — ABNORMAL HIGH (ref 3.5–5.2)
Sodium: 140 mmol/L (ref 134–144)
eGFR: 109 mL/min/1.73

## 2024-10-27 LAB — CBC WITH DIFFERENTIAL/PLATELET
Basophils Absolute: 0.1 x10E3/uL (ref 0.0–0.2)
Basos: 1 %
EOS (ABSOLUTE): 0.1 x10E3/uL (ref 0.0–0.4)
Eos: 2 %
Hematocrit: 43.2 % (ref 34.0–46.6)
Hemoglobin: 13.4 g/dL (ref 11.1–15.9)
Immature Grans (Abs): 0 x10E3/uL (ref 0.0–0.1)
Immature Granulocytes: 0 %
Lymphocytes Absolute: 2.4 x10E3/uL (ref 0.7–3.1)
Lymphs: 30 %
MCH: 26.6 pg (ref 26.6–33.0)
MCHC: 31 g/dL — ABNORMAL LOW (ref 31.5–35.7)
MCV: 86 fL (ref 79–97)
Monocytes Absolute: 0.5 x10E3/uL (ref 0.1–0.9)
Monocytes: 6 %
Neutrophils Absolute: 5 x10E3/uL (ref 1.4–7.0)
Neutrophils: 61 %
Platelets: 228 x10E3/uL (ref 150–450)
RBC: 5.03 x10E6/uL (ref 3.77–5.28)
RDW: 12.7 % (ref 11.7–15.4)
WBC: 8.1 x10E3/uL (ref 3.4–10.8)

## 2024-10-27 MED ORDER — SODIUM POLYSTYRENE SULFONATE PO POWD: ORAL | 0 refills | Status: DC

## 2024-10-29 NOTE — Progress Notes (Deleted)
" °  Cardiology Office Note:  .   Date:  10/29/2024  ID:  Allison Leonard Allison, DOB 07/17/1976, MRN [id number] PCP: Lennie Raguel MATSU, DO  [facility] Providers Cardiologist:  Darryle ONEIDA Decent, MD   History of Present Illness: .   No chief complaint on file.   Allison Leonard is a 48 y.o. female with below history who presents for follow-up.   History of Present Illness               Problem List Systolic HF -01/18/2022 EF 40-45% -12/07/2022 EF 50-55% -03/09/2024 EF 25-30% -normal coronary arteries  2. 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 HTN 5. Migraine    ROS: All other ROS reviewed and negative. Pertinent positives noted in the HPI.     Studies Reviewed: SABRA       LHC/RHC 07/09/2024 IMPRESSION: No coronary artery disease.  Normal to mildly reduced cardiac output / index.  Low filling pressures.  Mildly elevated PVR.   TEE 04/29/2024  1. Left ventricular ejection fraction, by estimation, is 25%. The left  ventricle has severely decreased function. The left ventricle demonstrates  global hypokinesis. The left ventricular internal cavity size was  moderately dilated.   2. Right ventricular systolic function is mildly reduced. The right  ventricular size is normal.   3. Left atrial size was moderately dilated. No left atrial/left atrial  appendage thrombus was detected.   4. The mitral valve is rheumatic. Moderate-severe mitral valve  regurgitation, in some pulmonary vein views there is a possible systolic  reversal. Moderate mitral stenosis. The mean mitral valve gradient is 6.0  mmHg with average heart rate of 69 bpm. MVA   PISA 1.2 cm2.   5. The aortic valve is tricuspid. There is mild thickening of the aortic  valve. Aortic valve regurgitation is moderate to severe. No aortic  stenosis is present. Aortic regurgitation PHT measures 354 msec, AR vena  contracta 0.5 cm.  Holodiastolic  reversals with reversal TVI challenging to obtain but appears to be 9 cm.   6. Agitated saline contrast bubble study was negative, with no evidence  of any interatrial shunt.   7. 3D performed of the mitral valve and 3D performed of the aortic valve  and demonstrates mitral valve is rheumatic, and aortic valve is mildly  thickened with central AI.   Physical Exam:   VS:  There were no vitals taken for this visit.   Wt Readings from Last 3 Encounters:  10/26/24 94 lb (42.6 kg)  10/18/24 97 lb (44 kg)  09/18/24 97 lb (44 kg)    GEN: Well nourished, well developed in no acute distress NECK: No JVD; No carotid bruits CARDIAC: ***RRR, no murmurs, rubs, gallops RESPIRATORY:  Clear to auscultation without rales, wheezing or rhonchi  ABDOMEN: Soft, non-tender, non-distended EXTREMITIES:  No edema; No deformity  ASSESSMENT AND PLAN: .   Assessment and Plan                 {Are you ordering a CV Procedure (e.g. stress test, cath, DCCV, TEE, etc)?   Press F2        :[id number]}   Follow-up: No follow-ups on file.  Signed, Darryle ONEIDA. Decent, MD, [facility]  [facility]  [address] [place], [place] [address] [phone]  5:52 PM   "

## 2024-10-30 ENCOUNTER — Ambulatory Visit: Admitting: Cardiovascular Disease

## 2024-10-30 DIAGNOSIS — I061 Rheumatic aortic insufficiency: Secondary | ICD-10-CM

## 2024-10-30 DIAGNOSIS — R002 Palpitations: Secondary | ICD-10-CM

## 2024-10-30 DIAGNOSIS — I5022 Chronic systolic (congestive) heart failure: Secondary | ICD-10-CM

## 2024-10-30 DIAGNOSIS — I493 Ventricular premature depolarization: Secondary | ICD-10-CM

## 2024-10-30 DIAGNOSIS — I051 Rheumatic mitral insufficiency: Secondary | ICD-10-CM

## 2024-11-18 ENCOUNTER — Other Ambulatory Visit: Payer: Self-pay | Admitting: Family Medicine

## 2024-11-18 DIAGNOSIS — R1115 Cyclical vomiting syndrome unrelated to migraine: Secondary | ICD-10-CM

## 2024-11-28 ENCOUNTER — Other Ambulatory Visit: Payer: Self-pay

## 2024-11-28 DIAGNOSIS — K5909 Other constipation: Secondary | ICD-10-CM

## 2024-11-28 DIAGNOSIS — R1013 Epigastric pain: Secondary | ICD-10-CM

## 2024-12-02 ENCOUNTER — Other Ambulatory Visit (HOSPITAL_COMMUNITY): Payer: Self-pay | Admitting: Adult Health

## 2024-12-04 ENCOUNTER — Ambulatory Visit (HOSPITAL_COMMUNITY)
Admission: RE | Admit: 2024-12-04 | Discharge: 2024-12-04 | Disposition: A | Source: Ambulatory Visit | Attending: Internal Medicine | Admitting: Internal Medicine

## 2024-12-04 ENCOUNTER — Encounter (HOSPITAL_COMMUNITY): Payer: Self-pay | Admitting: Internal Medicine

## 2024-12-04 ENCOUNTER — Other Ambulatory Visit (HOSPITAL_COMMUNITY): Payer: Self-pay

## 2024-12-04 ENCOUNTER — Telehealth (HOSPITAL_COMMUNITY): Payer: Self-pay

## 2024-12-04 VITALS — BP 110/72 | HR 108 | Wt 92.8 lb

## 2024-12-04 DIAGNOSIS — I051 Rheumatic mitral insufficiency: Secondary | ICD-10-CM | POA: Diagnosis not present

## 2024-12-04 DIAGNOSIS — Z7984 Long term (current) use of oral hypoglycemic drugs: Secondary | ICD-10-CM | POA: Insufficient documentation

## 2024-12-04 DIAGNOSIS — I4729 Other ventricular tachycardia: Secondary | ICD-10-CM | POA: Diagnosis not present

## 2024-12-04 DIAGNOSIS — Z79899 Other long term (current) drug therapy: Secondary | ICD-10-CM | POA: Insufficient documentation

## 2024-12-04 DIAGNOSIS — I5022 Chronic systolic (congestive) heart failure: Secondary | ICD-10-CM | POA: Diagnosis present

## 2024-12-04 DIAGNOSIS — I428 Other cardiomyopathies: Secondary | ICD-10-CM | POA: Insufficient documentation

## 2024-12-04 DIAGNOSIS — I493 Ventricular premature depolarization: Secondary | ICD-10-CM | POA: Diagnosis not present

## 2024-12-04 DIAGNOSIS — I110 Hypertensive heart disease with heart failure: Secondary | ICD-10-CM | POA: Insufficient documentation

## 2024-12-04 DIAGNOSIS — Z87891 Personal history of nicotine dependence: Secondary | ICD-10-CM | POA: Insufficient documentation

## 2024-12-04 DIAGNOSIS — Z720 Tobacco use: Secondary | ICD-10-CM | POA: Diagnosis not present

## 2024-12-04 DIAGNOSIS — I080 Rheumatic disorders of both mitral and aortic valves: Secondary | ICD-10-CM | POA: Insufficient documentation

## 2024-12-04 DIAGNOSIS — I671 Cerebral aneurysm, nonruptured: Secondary | ICD-10-CM | POA: Insufficient documentation

## 2024-12-04 DIAGNOSIS — Z7982 Long term (current) use of aspirin: Secondary | ICD-10-CM | POA: Insufficient documentation

## 2024-12-04 LAB — ECHOCARDIOGRAM COMPLETE
Calc EF: 38.7 %
P 1/2 time: 363 ms
S' Lateral: 4.2 cm
Single Plane A2C EF: 38.8 %
Single Plane A4C EF: 39.4 %

## 2024-12-04 LAB — BASIC METABOLIC PANEL WITH GFR
Anion gap: 13 (ref 5–15)
BUN: 17 mg/dL (ref 6–20)
CO2: 26 mmol/L (ref 22–32)
Calcium: 10 mg/dL (ref 8.9–10.3)
Chloride: 99 mmol/L (ref 98–111)
Creatinine, Ser: 0.56 mg/dL (ref 0.44–1.00)
GFR, Estimated: >60 mL/min
Glucose, Bld: 91 mg/dL (ref 70–99)
Potassium: 4.2 mmol/L (ref 3.5–5.1)
Sodium: 138 mmol/L (ref 135–145)

## 2024-12-04 LAB — PRO BRAIN NATRIURETIC PEPTIDE: Pro Brain Natriuretic Peptide: 92.9 pg/mL

## 2024-12-04 MED ORDER — IVABRADINE HCL 5 MG PO TABS: 5.0000 mg | ORAL_TABLET | Freq: Two times a day (BID) | ORAL | 6 refills | Status: AC

## 2024-12-04 NOTE — Progress Notes (Addendum)
 "  ADVANCED HEART FAILURE CLINIC NOTE  Referring Physician: Lennie Raguel MATSU, DO  Primary Care: Lennie Raguel MATSU, DO Primary Cardiologist: Heart Failure: Toribio Fuel, MD  CC: HFrEF, Severe MR/AI  HPI: Allison Leonard is a 49 y.o. female with severe MR, AI and HFrEF, NICM, and tobacco abuse.   Pertinent Family & social hx:  - Dad - coronary disease, Aunt died form heart failure.  -  Smoking 5-6 cigarettes a day and marijuana daily. Lives with her Mom and 3 adult children. Middle child has down syndrome.   Cardiac History:  - TTE in 3/23 with LVEF 50-55% and mild AI/MR. Repeat echo in 2/24 with LVEF 50% - Established care with Dr. Barbaraann in 2023 due to tachycardia / SOB.  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  - TEE on 04/28/24 w/ EF 25%, mod-severe MR, mod MS, mod-sev AI.  - Referred to Dr. Koal Eslinger for surgery and subsequently to [facility] for advanced therapies evaluation - did not make appt - [facility] 9/25: No CAD. RA 2 PA 30/8 (17) PCW 6 Fick 3.4/2.3 TD 4.0/2.8 - CPX 10/25: pVO2 16.6 (53%) Slope 29 pRER 1.19 OUES 1.2 (80%) - Echo 12/04/24: EF 35-40% Triv MR/TR   Interval hx:   She presents today for routine f/u. Can do anything she wants to do but gets tired quickly. No CP. + DOE with some ADLs No edema, orthopnea or PND. Quit smoking in November - only 1 cig since.   Current Meds  Medication Sig   aspirin  325 MG tablet Take 325 mg by mouth daily.   digoxin  (LANOXIN ) 0.125 MG tablet TAKE 1 TABLET BY MOUTH DAILY   empagliflozin  (JARDIANCE ) 10 MG TABS tablet Take 1 tablet (10 mg total) by mouth daily before breakfast.   famotidine  (PEPCID ) 20 MG tablet TAKE 1 TABLET BY MOUTH TWICE A DAY   Fremanezumab -vfrm (AJOVY ) 225 MG/1.5ML SOAJ Inject 225 mg into the skin every 28 (twenty-eight) days.   losartan  (COZAAR ) 50 MG tablet Take 1 tablet (50 mg total) by mouth daily.   metoCLOPramide  (REGLAN ) 10 MG tablet Take 1 tablet (10 mg total) by  mouth every 6 (six) hours as needed for nausea.   metoprolol  succinate (TOPROL  XL) 25 MG 24 hr tablet Take 1 tablet (25 mg total) by mouth daily.   mexiletine (MEXITIL ) 150 MG capsule Take 1 capsule (150 mg total) by mouth 2 (two) times daily.   mirtazapine  (REMERON ) 7.5 MG tablet TAKE 1 TABLET BY MOUTH AT BEDTIME.   Multiple Vitamin (MULTIVITAMIN WITH MINERALS) TABS tablet Take 1 tablet by mouth daily.   promethazine  (PHENERGAN ) 12.5 MG tablet Take 1 tablet (12.5 mg total) by mouth every 6 (six) hours as needed for nausea or vomiting.   Rimegepant Sulfate (NURTEC) 75 MG TBDP DISSOLVE 1 TABLET BY MOUTH AT EARLIEST ONSET OF MIGRAINE AS NEEDED. MAX 1 TAB PER 24 HOURS   spironolactone  (ALDACTONE ) 25 MG tablet Take 0.5 tablets (12.5 mg total) by mouth daily.   thiamine  (VITAMIN B1) 100 MG tablet Take 1 tablet (100 mg total) by mouth daily.   tiZANidine  (ZANAFLEX ) 4 MG tablet Take 1 tablet (4 mg total) by mouth 3 (three) times daily as needed for muscle spasms.     PHYSICAL EXAM: Vitals:   12/04/24 1037  BP: 110/72  Pulse: (!) 108  SpO2: 97%    Wt Readings from Last 3 Encounters:  12/04/24 42.1 kg (92 lb 12.8 oz)  10/26/24 42.6  kg (94 lb)  10/18/24 44 kg (97 lb)   General:  Thin. No resp difficulty HEENT: normal Neck: supple. no JVD.  Cor: Regular tachy No rubs, gallops or murmurs. Lungs: clear Abdomen: soft, nontender, nondistended.Good bowel sounds. Extremities: no cyanosis, clubbing, rash, edema Neuro: alert & orientedx3, cranial nerves grossly intact. moves all 4 extremities w/o difficulty. Affect pleasant  ECG sinus tach 104 No ST-T wave abnormalities. Personally reviewed   EKG:n/a  DATA REVIEW  ECG: 04/07/24: NSR w/ PVCs, LVH  As per my personal interpretation  ECHO: 04/28/24: LVEF 25%, RV mildly reduced. 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 Mod-severe AI.  12/04/24: EF 35-40% Triv MR/TR  CATH: 07/09/2024  RA:                  2 mmHg (mean) RV:                   28/2 mmHg PA:                  30/8 mmHg (17 mean) PCWP:            6 mmHg (mean)                                     Estimated Fick CO/CI   3.4 L/min, 2.3 L/min/m2 Thermodilution CO/CI   4 L/min, 2.8 L/min/m2                                              TPG                 11  mmHg                                            PVR                 2.75-3.2 Wood Units  PAPi                11  Coronary angiography Complicated by severe radial spasm throughout the case.  Short LM with trifurcation into LAD, RM, Lcx. No obstructive CAD visualized. Lcx not well visualized due to radial artery spasm. Dominant RCA with no disease.    IMPRESSION: No coronary artery disease.  Normal to mildly reduced cardiac output / index.  Low filling pressures.  Mildly elevated PVR.   - CPX 10/25: pVO2 16.6 (53%) Slope 29 pRER 1.19 OUES 1.2 (80%)  ASSESSMENT & PLAN:  Heart Failure with reduced fraction - Etiology & History: Likely valvular; mod-severe MR/AI 2/2 rheumatic changes. Unable to obtain CMR due to ICA embolization. Cath - no coronary disease, mildly reduced CO/CI. Low filling pressures.  - TEE June 2025 Echo LVEF 25%  previously EF was ~ 55%. In February 2024.  - Echo 12/04/24: EF 35-40% Triv MR/TR - NYHA III - Volume status: ok - GDMT:  -  RAASi: Increase losartan  50mg   Beta-blocker: toprol  25mg  daily -  Hydralazine/Nitrates: N/A -  SGLT2i: jardiance  10mg   - MRA: spironolactone  12.5 daily -            Ivabradine : Start 5 bid - Continue digoxin  0.125 daily -  ICD: Evaluating for advanced therapies - Complex case with several issues The first issue is whether she would be a candidate/benefit from dual valve surgery in setting of severe LV dysfunction. Case previously discussed with Dr. Shyrl who felt that she will be very high risk for x2 valve repair/replacement in the setting of biventricular failure. Now considering VAD. RHC and CPX do not qualify.  - EF and valvular disease  improved with GDMT. Will continue - Labs today  Mod-severe AI/MR  - as above - TEE on 04/28/24 - reviewed personally - Echo today triv MR/TR  Mild to moderate MS - mean gradient of - echo ok today  Right ICA Aneurysm - s/p embolization at [facility] in 11/2021  Tobacco use - Quit smoking in November - only 1 cig since.   Palpitations/PVCs/NSVT  - Zio 10/25: SR avg 102. 26 runs NSVT longest 11 beats. PVCs 5.8% - Continue mexilitene 150 bid for PVC suppression - No PVCs on ECG today   Toribio Fuel, MD  11:05 AM  "

## 2024-12-04 NOTE — Progress Notes (Signed)
" °  Echocardiogram 2D Echocardiogram has been performed.  Allison Leonard 12/04/2024, 10:45 AM "

## 2024-12-04 NOTE — Patient Instructions (Signed)
 Medication Changes:  START Ivabradine  5 mg Twice daily   Lab Work:  Labs done today, your results will be available in MyChart, we will contact you for abnormal readings.  Special Instructions // Education:  Do the following things EVERYDAY: Weigh yourself in the morning before breakfast. Write it down and keep it in a log. Take your medicines as prescribed Eat low salt foods--Limit salt (sodium) to 2000 mg per day.  Stay as active as you can everyday Limit all fluids for the day to less than 2 liters   Follow-Up in: 3 months   At the [facility], you and your health needs are our priority. We have a designated team specialized in the treatment of Heart Failure. This Care Team includes your primary Heart Failure Specialized Cardiologist (physician), Advanced Practice Providers (APPs- Physician Assistants and Nurse Practitioners), and Pharmacist who all work together to provide you with the care you need, when you need it.   You may see any of the following providers on your designated Care Team at your next follow up:  Dr. Toribio Fuel Dr. Ezra Shuck Dr. Odis Brownie Greig Mosses, NP Caffie Shed, [place], [place] Beckey Coe, NP Jordan Lee, NP Tinnie Redman, PharmD   Please be sure to bring in all your medications bottles to every appointment.   Need to Contact Us :  If you have any questions or concerns before your next appointment please send us  a message through [place] or call our office at [phone].    TO LEAVE A MESSAGE FOR THE NURSE SELECT OPTION 2, PLEASE LEAVE A MESSAGE INCLUDING: YOUR NAME DATE OF BIRTH CALL BACK NUMBER REASON FOR CALL**this is important as we prioritize the call backs  YOU WILL RECEIVE A CALL BACK THE SAME DAY AS LONG AS YOU CALL BEFORE 4:00 PM

## 2024-12-04 NOTE — Addendum Note (Signed)
 Encounter addended by: Buell Powell HERO, RN on: 12/04/2024 11:20 AM  Actions taken: Diagnosis association updated, Pend clinical note, Clinical Note Signed, Pharmacy for encounter modified, Order list changed, Charge Capture section accepted

## 2024-12-04 NOTE — Telephone Encounter (Signed)
 Advanced Heart Failure Patient Advocate Encounter  Test billing for this patient's current coverage [facility]) returns a $4 copay for 30 day supply of Ivabradine .  This test claim was processed through [facility]- copay amounts may vary at other pharmacies due to pharmacy/plan contracts, or as the patient moves through the different stages of their insurance plan.  Rachel DEL, CPhT Rx Patient Advocate Phone: [phone]

## 2025-02-16 ENCOUNTER — Ambulatory Visit: Admitting: Neurology

## 2025-03-03 ENCOUNTER — Ambulatory Visit (HOSPITAL_COMMUNITY)
